# Patient Record
Sex: Male | Born: 1955 | Race: Black or African American | Hispanic: No | Marital: Married | Smoking: Former smoker
Health system: Southern US, Community
[De-identification: ages and names within clinical notes are randomized; demographics above are authoritative.]

## PROBLEM LIST (undated history)

## (undated) DIAGNOSIS — N529 Male erectile dysfunction, unspecified: Secondary | ICD-10-CM

## (undated) DIAGNOSIS — M1710 Unilateral primary osteoarthritis, unspecified knee: Secondary | ICD-10-CM

## (undated) DIAGNOSIS — T7840XA Allergy, unspecified, initial encounter: Secondary | ICD-10-CM

## (undated) DIAGNOSIS — G4730 Sleep apnea, unspecified: Secondary | ICD-10-CM

## (undated) DIAGNOSIS — E291 Testicular hypofunction: Secondary | ICD-10-CM

## (undated) DIAGNOSIS — J189 Pneumonia, unspecified organism: Secondary | ICD-10-CM

## (undated) DIAGNOSIS — M159 Polyosteoarthritis, unspecified: Secondary | ICD-10-CM

## (undated) DIAGNOSIS — R413 Other amnesia: Secondary | ICD-10-CM

## (undated) DIAGNOSIS — G4700 Insomnia, unspecified: Secondary | ICD-10-CM

## (undated) DIAGNOSIS — IMO0002 Reserved for concepts with insufficient information to code with codable children: Secondary | ICD-10-CM

## (undated) DIAGNOSIS — Z86010 Personal history of colonic polyps: Secondary | ICD-10-CM

## (undated) DIAGNOSIS — K219 Gastro-esophageal reflux disease without esophagitis: Secondary | ICD-10-CM

## (undated) DIAGNOSIS — M1990 Unspecified osteoarthritis, unspecified site: Secondary | ICD-10-CM

## (undated) DIAGNOSIS — R945 Abnormal results of liver function studies: Secondary | ICD-10-CM

## (undated) DIAGNOSIS — I10 Essential (primary) hypertension: Secondary | ICD-10-CM

## (undated) HISTORY — DX: Other amnesia: R41.3

## (undated) HISTORY — DX: Allergy, unspecified, initial encounter: T78.40XA

## (undated) HISTORY — DX: Polyosteoarthritis, unspecified: M15.9

## (undated) HISTORY — DX: Testicular hypofunction: E29.1

## (undated) HISTORY — DX: Abnormal results of liver function studies: R94.5

## (undated) HISTORY — DX: Essential (primary) hypertension: I10

## (undated) HISTORY — DX: Sleep apnea, unspecified: G47.30

## (undated) HISTORY — DX: Unspecified osteoarthritis, unspecified site: M19.90

## (undated) HISTORY — DX: Personal history of colonic polyps: Z86.010

## (undated) HISTORY — DX: Gastro-esophageal reflux disease without esophagitis: K21.9

## (undated) HISTORY — DX: Reserved for concepts with insufficient information to code with codable children: IMO0002

## (undated) HISTORY — PX: MULTIPLE TOOTH EXTRACTIONS: SHX2053

## (undated) HISTORY — PX: HAND SURGERY: SHX662

## (undated) HISTORY — PX: JOINT REPLACEMENT: SHX530

## (undated) HISTORY — DX: Male erectile dysfunction, unspecified: N52.9

## (undated) HISTORY — PX: COLONOSCOPY: SHX174

## (undated) HISTORY — DX: Unilateral primary osteoarthritis, unspecified knee: M17.10

## (undated) HISTORY — DX: Insomnia, unspecified: G47.00

---

## 1998-07-07 ENCOUNTER — Encounter: Payer: Self-pay | Admitting: Emergency Medicine

## 1998-07-07 ENCOUNTER — Emergency Department (HOSPITAL_COMMUNITY): Admission: EM | Admit: 1998-07-07 | Discharge: 1998-07-07 | Payer: Self-pay | Admitting: Emergency Medicine

## 2004-03-22 ENCOUNTER — Ambulatory Visit: Payer: Self-pay | Admitting: Internal Medicine

## 2004-05-25 ENCOUNTER — Ambulatory Visit: Payer: Self-pay | Admitting: Internal Medicine

## 2004-06-01 ENCOUNTER — Ambulatory Visit: Payer: Self-pay | Admitting: Internal Medicine

## 2005-01-02 ENCOUNTER — Ambulatory Visit: Payer: Self-pay | Admitting: Internal Medicine

## 2005-01-14 ENCOUNTER — Ambulatory Visit: Payer: Self-pay | Admitting: Internal Medicine

## 2005-02-06 ENCOUNTER — Ambulatory Visit: Payer: Self-pay | Admitting: Internal Medicine

## 2005-03-18 ENCOUNTER — Ambulatory Visit: Payer: Self-pay | Admitting: Internal Medicine

## 2005-04-15 ENCOUNTER — Ambulatory Visit: Payer: Self-pay | Admitting: Internal Medicine

## 2005-06-13 ENCOUNTER — Ambulatory Visit: Payer: Self-pay | Admitting: Internal Medicine

## 2005-09-13 ENCOUNTER — Ambulatory Visit: Payer: Self-pay | Admitting: Internal Medicine

## 2005-11-05 ENCOUNTER — Ambulatory Visit: Payer: Self-pay | Admitting: Internal Medicine

## 2006-01-10 ENCOUNTER — Ambulatory Visit: Payer: Self-pay | Admitting: Internal Medicine

## 2006-01-10 LAB — CONVERTED CEMR LAB
Alkaline Phosphatase: 49 units/L (ref 39–117)
Basophils Absolute: 0 10*3/uL (ref 0.0–0.1)
CO2: 27 meq/L (ref 19–32)
Calcium: 9.9 mg/dL (ref 8.4–10.5)
Chloride: 105 meq/L (ref 96–112)
Chol/HDL Ratio, serum: 4.2
Cholesterol: 190 mg/dL (ref 0–200)
Creatinine, Ser: 1.1 mg/dL (ref 0.4–1.5)
Glomerular Filtration Rate, Af Am: 91 mL/min/{1.73_m2}
Glucose, Bld: 105 mg/dL — ABNORMAL HIGH (ref 70–99)
LDL Cholesterol: 126 mg/dL — ABNORMAL HIGH (ref 0–99)
Lymphocytes Relative: 21.8 % (ref 12.0–46.0)
MCHC: 34.9 g/dL (ref 30.0–36.0)
MCV: 93.4 fL (ref 78.0–100.0)
Monocytes Absolute: 0.5 10*3/uL (ref 0.2–0.7)
Monocytes Relative: 7.7 % (ref 3.0–11.0)
Neutrophils Relative %: 68.4 % (ref 43.0–77.0)
Platelets: 212 10*3/uL (ref 150–400)
TSH: 1.04 microintl units/mL (ref 0.35–5.50)
Triglyceride fasting, serum: 95 mg/dL (ref 0–149)

## 2006-01-17 ENCOUNTER — Ambulatory Visit: Payer: Self-pay | Admitting: Internal Medicine

## 2006-01-30 ENCOUNTER — Ambulatory Visit: Payer: Self-pay | Admitting: Internal Medicine

## 2006-02-14 ENCOUNTER — Ambulatory Visit: Payer: Self-pay | Admitting: Internal Medicine

## 2006-03-20 ENCOUNTER — Ambulatory Visit: Payer: Self-pay | Admitting: Internal Medicine

## 2006-05-22 ENCOUNTER — Ambulatory Visit: Payer: Self-pay | Admitting: Internal Medicine

## 2006-08-22 ENCOUNTER — Ambulatory Visit: Payer: Self-pay | Admitting: Internal Medicine

## 2006-08-26 ENCOUNTER — Encounter: Admission: RE | Admit: 2006-08-26 | Discharge: 2006-08-26 | Payer: Self-pay | Admitting: Internal Medicine

## 2006-09-05 ENCOUNTER — Telehealth: Payer: Self-pay | Admitting: *Deleted

## 2006-09-09 ENCOUNTER — Encounter: Payer: Self-pay | Admitting: Internal Medicine

## 2006-10-30 ENCOUNTER — Telehealth: Payer: Self-pay | Admitting: Internal Medicine

## 2006-10-30 DIAGNOSIS — IMO0002 Reserved for concepts with insufficient information to code with codable children: Secondary | ICD-10-CM

## 2006-10-30 HISTORY — DX: Reserved for concepts with insufficient information to code with codable children: IMO0002

## 2006-11-02 ENCOUNTER — Encounter: Admission: RE | Admit: 2006-11-02 | Discharge: 2006-11-02 | Payer: Self-pay | Admitting: Internal Medicine

## 2006-11-03 ENCOUNTER — Encounter: Payer: Self-pay | Admitting: Internal Medicine

## 2006-11-03 ENCOUNTER — Telehealth: Payer: Self-pay | Admitting: *Deleted

## 2006-12-11 ENCOUNTER — Encounter: Payer: Self-pay | Admitting: Internal Medicine

## 2007-01-01 DIAGNOSIS — K219 Gastro-esophageal reflux disease without esophagitis: Secondary | ICD-10-CM | POA: Insufficient documentation

## 2007-01-01 DIAGNOSIS — I10 Essential (primary) hypertension: Secondary | ICD-10-CM | POA: Insufficient documentation

## 2007-01-01 HISTORY — DX: Gastro-esophageal reflux disease without esophagitis: K21.9

## 2007-01-06 ENCOUNTER — Ambulatory Visit: Payer: Self-pay | Admitting: Internal Medicine

## 2007-01-06 LAB — CONVERTED CEMR LAB
ALT: 85 units/L — ABNORMAL HIGH (ref 0–53)
AST: 38 units/L — ABNORMAL HIGH (ref 0–37)
Albumin: 4.6 g/dL (ref 3.5–5.2)
Alkaline Phosphatase: 54 units/L (ref 39–117)
Basophils Absolute: 0 10*3/uL (ref 0.0–0.1)
Calcium: 10.4 mg/dL (ref 8.4–10.5)
Chloride: 102 meq/L (ref 96–112)
Cholesterol: 192 mg/dL (ref 0–200)
Creatinine, Ser: 1 mg/dL (ref 0.4–1.5)
Eosinophils Absolute: 0.1 10*3/uL (ref 0.0–0.6)
GFR calc non Af Amer: 84 mL/min
HCT: 43.8 % (ref 39.0–52.0)
LDL Cholesterol: 123 mg/dL — ABNORMAL HIGH (ref 0–99)
MCHC: 34.7 g/dL (ref 30.0–36.0)
MCV: 92.7 fL (ref 78.0–100.0)
Neutrophils Relative %: 64 % (ref 43.0–77.0)
Platelets: 207 10*3/uL (ref 150–400)
Protein, U semiquant: NEGATIVE
RBC: 4.73 M/uL (ref 4.22–5.81)
RDW: 11.6 % (ref 11.5–14.6)
Sodium: 139 meq/L (ref 135–145)
Total Bilirubin: 0.9 mg/dL (ref 0.3–1.2)
Total CHOL/HDL Ratio: 6
Triglycerides: 187 mg/dL — ABNORMAL HIGH (ref 0–149)
Urobilinogen, UA: 0.2
WBC Urine, dipstick: NEGATIVE
WBC: 7.4 10*3/uL (ref 4.5–10.5)
pH: 5.5

## 2007-01-13 ENCOUNTER — Ambulatory Visit: Payer: Self-pay | Admitting: Internal Medicine

## 2007-03-17 ENCOUNTER — Ambulatory Visit: Payer: Self-pay | Admitting: Internal Medicine

## 2007-05-18 ENCOUNTER — Telehealth: Payer: Self-pay | Admitting: Internal Medicine

## 2007-06-16 ENCOUNTER — Ambulatory Visit: Payer: Self-pay | Admitting: Internal Medicine

## 2007-08-27 ENCOUNTER — Ambulatory Visit: Payer: Self-pay | Admitting: Internal Medicine

## 2007-08-27 DIAGNOSIS — R945 Abnormal results of liver function studies: Secondary | ICD-10-CM

## 2007-08-27 DIAGNOSIS — R413 Other amnesia: Secondary | ICD-10-CM

## 2007-08-27 DIAGNOSIS — R7989 Other specified abnormal findings of blood chemistry: Secondary | ICD-10-CM | POA: Insufficient documentation

## 2007-08-27 HISTORY — DX: Other amnesia: R41.3

## 2007-08-27 HISTORY — DX: Abnormal results of liver function studies: R94.5

## 2007-08-27 LAB — CONVERTED CEMR LAB
ALT: 103 units/L — ABNORMAL HIGH (ref 0–53)
AST: 55 units/L — ABNORMAL HIGH (ref 0–37)
Albumin: 4.3 g/dL (ref 3.5–5.2)
Alkaline Phosphatase: 47 units/L (ref 39–117)
BUN: 21 mg/dL (ref 6–23)
CO2: 27 meq/L (ref 19–32)
Chloride: 104 meq/L (ref 96–112)
Creatinine, Ser: 1.1 mg/dL (ref 0.4–1.5)
GFR calc non Af Amer: 75 mL/min
Glucose, Bld: 117 mg/dL — ABNORMAL HIGH (ref 70–99)
Potassium: 3.9 meq/L (ref 3.5–5.1)

## 2007-11-10 ENCOUNTER — Ambulatory Visit: Payer: Self-pay | Admitting: Internal Medicine

## 2007-11-10 DIAGNOSIS — M1710 Unilateral primary osteoarthritis, unspecified knee: Secondary | ICD-10-CM | POA: Insufficient documentation

## 2007-11-10 DIAGNOSIS — M159 Polyosteoarthritis, unspecified: Secondary | ICD-10-CM

## 2007-11-10 HISTORY — DX: Unilateral primary osteoarthritis, unspecified knee: M17.10

## 2007-11-10 HISTORY — DX: Polyosteoarthritis, unspecified: M15.9

## 2007-11-10 LAB — CONVERTED CEMR LAB
ALT: 109 units/L — ABNORMAL HIGH (ref 0–53)
Bilirubin, Direct: 0.1 mg/dL (ref 0.0–0.3)
CO2: 28 meq/L (ref 19–32)
Calcium: 9.8 mg/dL (ref 8.4–10.5)
Creatinine, Ser: 1.1 mg/dL (ref 0.4–1.5)
GFR calc Af Amer: 90 mL/min
Sodium: 140 meq/L (ref 135–145)
Total Bilirubin: 1 mg/dL (ref 0.3–1.2)
Total Protein: 7.4 g/dL (ref 6.0–8.3)

## 2007-11-17 ENCOUNTER — Ambulatory Visit: Payer: Self-pay | Admitting: Internal Medicine

## 2007-11-24 ENCOUNTER — Ambulatory Visit: Payer: Self-pay | Admitting: Internal Medicine

## 2007-12-01 ENCOUNTER — Ambulatory Visit: Payer: Self-pay | Admitting: Internal Medicine

## 2008-01-29 ENCOUNTER — Ambulatory Visit: Payer: Self-pay | Admitting: Internal Medicine

## 2008-01-29 LAB — CONVERTED CEMR LAB
ALT: 91 units/L — ABNORMAL HIGH (ref 0–53)
Basophils Absolute: 0.1 10*3/uL (ref 0.0–0.1)
Basophils Relative: 0.7 % (ref 0.0–3.0)
Bilirubin Urine: NEGATIVE
Calcium: 9.9 mg/dL (ref 8.4–10.5)
Cholesterol: 185 mg/dL (ref 0–200)
Creatinine, Ser: 0.9 mg/dL (ref 0.4–1.5)
Eosinophils Absolute: 0.1 10*3/uL (ref 0.0–0.7)
GFR calc Af Amer: 114 mL/min
GFR calc non Af Amer: 94 mL/min
Glucose, Urine, Semiquant: NEGATIVE
Hemoglobin: 15.4 g/dL (ref 13.0–17.0)
Ketones, urine, test strip: NEGATIVE
MCHC: 34.5 g/dL (ref 30.0–36.0)
MCV: 94.6 fL (ref 78.0–100.0)
Monocytes Absolute: 0.6 10*3/uL (ref 0.1–1.0)
Neutro Abs: 4.9 10*3/uL (ref 1.4–7.7)
PSA: 0.94 ng/mL (ref 0.10–4.00)
Protein, U semiquant: NEGATIVE
RBC: 4.72 M/uL (ref 4.22–5.81)
RDW: 11.8 % (ref 11.5–14.6)
TSH: 0.77 microintl units/mL (ref 0.35–5.50)
Total Bilirubin: 1.1 mg/dL (ref 0.3–1.2)
Triglycerides: 87 mg/dL (ref 0–149)
pH: 6.5

## 2008-02-05 ENCOUNTER — Ambulatory Visit: Payer: Self-pay | Admitting: Internal Medicine

## 2008-04-18 ENCOUNTER — Telehealth: Payer: Self-pay | Admitting: Internal Medicine

## 2008-06-07 ENCOUNTER — Ambulatory Visit: Payer: Self-pay | Admitting: Internal Medicine

## 2008-06-07 DIAGNOSIS — M1990 Unspecified osteoarthritis, unspecified site: Secondary | ICD-10-CM | POA: Insufficient documentation

## 2008-06-07 HISTORY — DX: Unspecified osteoarthritis, unspecified site: M19.90

## 2008-06-07 LAB — CONVERTED CEMR LAB
AST: 43 units/L — ABNORMAL HIGH (ref 0–37)
Albumin: 4.4 g/dL (ref 3.5–5.2)
Alkaline Phosphatase: 54 units/L (ref 39–117)
CO2: 27 meq/L (ref 19–32)
Calcium: 9.6 mg/dL (ref 8.4–10.5)
Cholesterol: 177 mg/dL (ref 0–200)
Glucose, Bld: 101 mg/dL — ABNORMAL HIGH (ref 70–99)
HDL: 38.2 mg/dL — ABNORMAL LOW (ref >39.00)
Sodium: 140 meq/L (ref 135–145)
Total Protein: 7.4 g/dL (ref 6.0–8.3)

## 2008-06-14 ENCOUNTER — Ambulatory Visit: Payer: Self-pay | Admitting: Internal Medicine

## 2008-06-22 ENCOUNTER — Telehealth: Payer: Self-pay | Admitting: Internal Medicine

## 2008-07-11 ENCOUNTER — Telehealth: Payer: Self-pay | Admitting: Internal Medicine

## 2008-09-13 ENCOUNTER — Ambulatory Visit: Payer: Self-pay | Admitting: Internal Medicine

## 2008-09-13 LAB — CONVERTED CEMR LAB
ALT: 60 units/L — ABNORMAL HIGH (ref 0–53)
AST: 37 units/L (ref 0–37)
Albumin: 4.4 g/dL (ref 3.5–5.2)
Alkaline Phosphatase: 56 units/L (ref 39–117)
BUN: 25 mg/dL — ABNORMAL HIGH (ref 6–23)
Chloride: 107 meq/L (ref 96–112)
GFR calc non Af Amer: 100.39 mL/min (ref >60)
Total Protein: 7.3 g/dL (ref 6.0–8.3)

## 2008-11-14 ENCOUNTER — Telehealth: Payer: Self-pay | Admitting: Internal Medicine

## 2008-12-16 ENCOUNTER — Telehealth: Payer: Self-pay | Admitting: Internal Medicine

## 2009-02-15 ENCOUNTER — Ambulatory Visit: Payer: Self-pay | Admitting: Internal Medicine

## 2009-02-15 LAB — CONVERTED CEMR LAB
Albumin: 4.6 g/dL (ref 3.5–5.2)
Basophils Relative: 0.6 % (ref 0.0–3.0)
CO2: 31 meq/L (ref 19–32)
Chloride: 104 meq/L (ref 96–112)
Eosinophils Absolute: 0.1 10*3/uL (ref 0.0–0.7)
Glucose, Urine, Semiquant: NEGATIVE
Hemoglobin: 14.1 g/dL (ref 13.0–17.0)
Ketones, urine, test strip: NEGATIVE
Lymphs Abs: 1.8 10*3/uL (ref 0.7–4.0)
MCHC: 34 g/dL (ref 30.0–36.0)
MCV: 96.8 fL (ref 78.0–100.0)
Monocytes Absolute: 0.5 10*3/uL (ref 0.1–1.0)
Neutro Abs: 3.9 10*3/uL (ref 1.4–7.7)
Nitrite: NEGATIVE
PSA: 0.77 ng/mL (ref 0.10–4.00)
RBC: 4.28 M/uL (ref 4.22–5.81)
Sodium: 144 meq/L (ref 135–145)
Specific Gravity, Urine: >=1.03
Total CHOL/HDL Ratio: 4
Total Protein: 7.4 g/dL (ref 6.0–8.3)
Triglycerides: 83 mg/dL (ref 0.0–149.0)
pH: 5

## 2009-02-21 ENCOUNTER — Ambulatory Visit: Payer: Self-pay | Admitting: Internal Medicine

## 2009-02-21 DIAGNOSIS — N529 Male erectile dysfunction, unspecified: Secondary | ICD-10-CM

## 2009-02-21 HISTORY — DX: Male erectile dysfunction, unspecified: N52.9

## 2009-02-22 LAB — CONVERTED CEMR LAB: Testosterone: 193.35 ng/dL — ABNORMAL LOW (ref 350.00–890.00)

## 2009-03-02 ENCOUNTER — Ambulatory Visit: Payer: Self-pay | Admitting: Internal Medicine

## 2009-03-17 ENCOUNTER — Encounter: Payer: Self-pay | Admitting: Internal Medicine

## 2009-05-05 ENCOUNTER — Ambulatory Visit: Payer: Self-pay | Admitting: Internal Medicine

## 2009-05-05 LAB — CONVERTED CEMR LAB
ALT: 59 U/L — ABNORMAL HIGH
AST: 34 U/L
Albumin: 4.5 g/dL
Alkaline Phosphatase: 44 U/L
Bilirubin, Direct: 0 mg/dL
Total Bilirubin: 0.7 mg/dL
Total Protein: 7.4 g/dL

## 2009-05-15 ENCOUNTER — Ambulatory Visit: Payer: Self-pay | Admitting: Internal Medicine

## 2009-08-07 ENCOUNTER — Ambulatory Visit: Payer: Self-pay | Admitting: Internal Medicine

## 2009-08-07 LAB — CONVERTED CEMR LAB
AST: 33 units/L (ref 0–37)
Alkaline Phosphatase: 46 units/L (ref 39–117)
Testosterone: 297.24 ng/dL — ABNORMAL LOW (ref 350.00–890.00)
Total Bilirubin: 0.6 mg/dL (ref 0.3–1.2)

## 2009-08-14 ENCOUNTER — Ambulatory Visit: Payer: Self-pay | Admitting: Internal Medicine

## 2009-08-14 DIAGNOSIS — E291 Testicular hypofunction: Secondary | ICD-10-CM

## 2009-08-14 DIAGNOSIS — R7989 Other specified abnormal findings of blood chemistry: Secondary | ICD-10-CM | POA: Insufficient documentation

## 2009-08-14 HISTORY — DX: Testicular hypofunction: E29.1

## 2009-09-18 ENCOUNTER — Telehealth: Payer: Self-pay | Admitting: Internal Medicine

## 2009-09-20 ENCOUNTER — Telehealth: Payer: Self-pay | Admitting: Internal Medicine

## 2009-09-20 DIAGNOSIS — G4730 Sleep apnea, unspecified: Secondary | ICD-10-CM

## 2009-09-20 HISTORY — DX: Sleep apnea, unspecified: G47.30

## 2009-10-27 ENCOUNTER — Ambulatory Visit (HOSPITAL_BASED_OUTPATIENT_CLINIC_OR_DEPARTMENT_OTHER): Admission: RE | Admit: 2009-10-27 | Discharge: 2009-10-27 | Payer: Self-pay | Admitting: Internal Medicine

## 2009-10-27 ENCOUNTER — Encounter: Payer: Self-pay | Admitting: Internal Medicine

## 2009-11-06 ENCOUNTER — Ambulatory Visit: Payer: Self-pay | Admitting: Internal Medicine

## 2009-11-13 ENCOUNTER — Ambulatory Visit: Payer: Self-pay | Admitting: Internal Medicine

## 2009-11-13 ENCOUNTER — Ambulatory Visit: Payer: Self-pay | Admitting: Pulmonary Disease

## 2009-11-13 DIAGNOSIS — G4700 Insomnia, unspecified: Secondary | ICD-10-CM

## 2009-11-13 HISTORY — DX: Insomnia, unspecified: G47.00

## 2009-11-14 ENCOUNTER — Encounter: Payer: Self-pay | Admitting: Internal Medicine

## 2009-12-26 ENCOUNTER — Ambulatory Visit: Payer: Self-pay | Admitting: Internal Medicine

## 2009-12-30 ENCOUNTER — Ambulatory Visit: Payer: Self-pay | Admitting: Family Medicine

## 2009-12-30 DIAGNOSIS — J329 Chronic sinusitis, unspecified: Secondary | ICD-10-CM | POA: Insufficient documentation

## 2010-01-01 ENCOUNTER — Telehealth: Payer: Self-pay | Admitting: Internal Medicine

## 2010-01-02 ENCOUNTER — Ambulatory Visit: Payer: Self-pay | Admitting: Internal Medicine

## 2010-01-03 ENCOUNTER — Encounter: Payer: Self-pay | Admitting: Internal Medicine

## 2010-01-09 ENCOUNTER — Ambulatory Visit: Payer: Self-pay | Admitting: Internal Medicine

## 2010-03-20 ENCOUNTER — Other Ambulatory Visit: Payer: Self-pay | Admitting: Internal Medicine

## 2010-03-20 ENCOUNTER — Ambulatory Visit
Admission: RE | Admit: 2010-03-20 | Discharge: 2010-03-20 | Payer: Self-pay | Source: Home / Self Care | Attending: Internal Medicine | Admitting: Internal Medicine

## 2010-03-20 LAB — CBC WITH DIFFERENTIAL/PLATELET
Basophils Absolute: 0.1 10*3/uL (ref 0.0–0.1)
Basophils Relative: 0.6 % (ref 0.0–3.0)
Eosinophils Absolute: 0.5 10*3/uL (ref 0.0–0.7)
Eosinophils Relative: 5.2 % — ABNORMAL HIGH (ref 0.0–5.0)
HCT: 45 % (ref 39.0–52.0)
Hemoglobin: 15.5 g/dL (ref 13.0–17.0)
Lymphocytes Relative: 22.5 % (ref 12.0–46.0)
Lymphs Abs: 2 10*3/uL (ref 0.7–4.0)
MCHC: 34.4 g/dL (ref 30.0–36.0)
MCV: 93.5 fl (ref 78.0–100.0)
Monocytes Absolute: 0.7 10*3/uL (ref 0.1–1.0)
Monocytes Relative: 7.4 % (ref 3.0–12.0)
Neutro Abs: 5.7 10*3/uL (ref 1.4–7.7)
Neutrophils Relative %: 64.3 % (ref 43.0–77.0)
Platelets: 234 10*3/uL (ref 150.0–400.0)
RBC: 4.81 Mil/uL (ref 4.22–5.81)
RDW: 12.7 % (ref 11.5–14.6)
WBC: 8.8 10*3/uL (ref 4.5–10.5)

## 2010-03-20 LAB — HEPATIC FUNCTION PANEL
ALT: 50 U/L (ref 0–53)
AST: 34 U/L (ref 0–37)
Albumin: 4.9 g/dL (ref 3.5–5.2)
Alkaline Phosphatase: 53 U/L (ref 39–117)
Bilirubin, Direct: 0.2 mg/dL (ref 0.0–0.3)
Total Bilirubin: 1.2 mg/dL (ref 0.3–1.2)
Total Protein: 7.9 g/dL (ref 6.0–8.3)

## 2010-03-20 LAB — CONVERTED CEMR LAB
Bilirubin Urine: NEGATIVE
Ketones, urine, test strip: NEGATIVE
Nitrite: NEGATIVE
Protein, U semiquant: NEGATIVE
Urobilinogen, UA: 0.2

## 2010-03-20 LAB — BASIC METABOLIC PANEL WITH GFR
BUN: 23 mg/dL (ref 6–23)
CO2: 29 meq/L (ref 19–32)
Calcium: 10.1 mg/dL (ref 8.4–10.5)
Chloride: 102 meq/L (ref 96–112)
Creatinine, Ser: 1.2 mg/dL (ref 0.4–1.5)
GFR: 84.11 mL/min
Glucose, Bld: 107 mg/dL — ABNORMAL HIGH (ref 70–99)
Potassium: 4.9 meq/L (ref 3.5–5.1)
Sodium: 140 meq/L (ref 135–145)

## 2010-03-20 LAB — LIPID PANEL
Cholesterol: 189 mg/dL (ref 0–200)
HDL: 41.8 mg/dL
LDL Cholesterol: 114 mg/dL — ABNORMAL HIGH (ref 0–99)
Total CHOL/HDL Ratio: 5
Triglycerides: 167 mg/dL — ABNORMAL HIGH (ref 0.0–149.0)
VLDL: 33.4 mg/dL (ref 0.0–40.0)

## 2010-03-27 ENCOUNTER — Encounter: Payer: Self-pay | Admitting: Internal Medicine

## 2010-03-27 DIAGNOSIS — G4700 Insomnia, unspecified: Secondary | ICD-10-CM

## 2010-03-27 NOTE — Assessment & Plan Note (Signed)
Summary: 3 month rov/n/jr Nathan Stewart BMP/NJR   Vital Signs:  Patient profile:   55 year old male Height:      74 inches Weight:      208 pounds BMI:     26.80 Temp:     98.2 degrees F oral Pulse rate:   80 / minute Resp:     14 per minute BP sitting:   140 / 80  (left arm)  Vitals Entered By: Willy Eddy, LPN (May 15, 2009 3:42 PM) CC: roa labs, Hypertension Management   CC:  roa labs and Hypertension Management.  History of Present Illness: follow up for HTN, chronically elvated lfts from chronic hepatitis GERD and ED discussion of his medications and [place] labs  Hypertension History:      He denies headache, chest pain, palpitations, dyspnea with exertion, orthopnea, PND, peripheral edema, visual symptoms, neurologic problems, syncope, and side effects from treatment.        Positive major cardiovascular risk factors include male age 67 years old or older and hypertension.  Negative major cardiovascular risk factors include non-tobacco-user status.     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) 5)  Memory Loss  (ICD-780.93) 6)  Liver Function Tests, Abnormal  (ICD-794.8) 7)  Physical Examination  (ICD-V70.0) 8)  Hypertension  (ICD-401.9) 9)  Gerd  (ICD-530.81) 10)  Meniscus Tear  (ICD-836.2)  Current Problems (verified): 1)  Erectile Dysfunction, Organic  (ICD-607.84) 2)  Osteoarthritis  (ICD-715.90) 3)  Loc Osteoarthros Not Spec Prim/sec Lower Leg  (ICD-715.36) 4)  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 One By Mouth Two Times A Day 2)  Viagra 50 Mg Tabs (Sildenafil Citrate) .... One By Mouth As Directed 3)  Benicar Hct 20-12.5 Mg Tabs (Olmesartan Medoxomil-Hctz) .... One By Mouth Dialy 4)  Testosterone Cypionate 200 Mg/ml Oil (Testosterone Cypionate) .Marland Kitchen.. 1 Ml Every Month  Current Medications (verified): 1)  Ibuprofen 800 Mg  Tabs (Ibuprofen) .... One By Mouth Two Times A Day 2)  Viagra 50 Mg Tabs (Sildenafil Citrate) .... One By Mouth As Directed 3)  Benicar Hct 20-12.5 Mg Tabs (Olmesartan Medoxomil-Hctz) .... One By Mouth Dialy 4)  Testosterone Cypionate 200 Mg/ml Oil (Testosterone Cypionate) .Marland Kitchen.. 1 Ml Every Month  Allergies (verified): No Known Drug Allergies  Past History:  Social History: Last updated: 03/17/2007 Married Former Smoker  Risk Factors: Smoking Status: quit (05/15/2009)  Past medical, surgical, family and social histories (including risk factors) reviewed, and no changes noted (except as noted below).  Past Medical History: Reviewed history from 12/01/2007 and no changes required. GERD Hypertension Osteoarthritis  Past Surgical History: Reviewed history from 01/13/2007 and no changes required. hand surgery  Family History: Reviewed history and no changes required.  Social History: Reviewed history from 03/17/2007 and no changes required. Married Former Smoker  Review of Systems  The patient denies anorexia, fever, weight loss, weight gain, vision loss, decreased hearing, hoarseness, chest pain, syncope, dyspnea on exertion,  peripheral edema, prolonged cough, headaches, hemoptysis, abdominal pain, melena, hematochezia, severe indigestion/heartburn, hematuria, incontinence, genital sores, muscle weakness, suspicious skin lesions, transient blindness, difficulty walking, depression, unusual weight change, abnormal bleeding, enlarged  lymph nodes, angioedema, and breast masses.    Physical Exam  General:  Well-developed,well-nourished,in no acute distress; alert,appropriate and cooperative throughout examination  AA male Head:  normocephalic and atraumatic.   Neck:  supple.   Lungs:  normal respiratory effort and no wheezes.   Heart:  normal rate and regular rhythm.   Abdomen:  soft, non-tender, and normal bowel sounds.   Rectal:  no external abnormalities and external hemorrhoid(s).   Msk:  no joint warmth, decreased ROM, joint tenderness, and joint swelling.   Extremities:  No clubbing, cyanosis, edema, or deformity noted with normal full range of motion of all joints.   Neurologic:  alert & oriented X3 and cranial nerves II-XII intact.     Impression & Recommendations:  Problem # 1:  HYPERTENSION (ICD-401.9)  His updated medication list for this problem includes:    Benicar Hct 20-12.5 Mg Tabs (Olmesartan medoxomil-hctz) ..... One by mouth dialy  BP today: 140/80 Prior BP: 142/88 (02/21/2009)  Prior 10 Yr Risk Heart Disease: 18 % (01/13/2007)  Labs Reviewed: K+: 3.9 (02/15/2009) Creat: : 1.2 (02/15/2009)   Chol: 179 (02/15/2009)   HDL: 41.90 (02/15/2009)   LDL: 121 (02/15/2009)   TG: 83.0 (02/15/2009)  Problem # 2:  ERECTILE DYSFUNCTION, ORGANIC (ICD-607.84) Assessment: Unchanged  His updated medication list for this problem includes:    Viagra 50 Mg Tabs (Sildenafil citrate) ..... One by mouth as directed  Discussed proper use of medications, as well as side effects.   Problem # 3:  LIVER FUNCTION TESTS, ABNORMAL (ICD-794.8)  Advised patient to avoid alcohol and Tylenol. Call for worsening symptoms.   Complete Medication List: 1)  Ibuprofen 800 Mg Tabs (Ibuprofen) .... One by mouth two times a day 2)  Viagra 50 Mg Tabs (Sildenafil citrate) .... One by mouth as directed 3)  Benicar Hct 20-12.5 Mg Tabs (Olmesartan medoxomil-hctz) .... One by mouth dialy 4)  Testosterone Cypionate 200 Mg/ml Oil  (Testosterone cypionate) .Marland Kitchen.. 1 ml every month  Hypertension Assessment/Plan:      The patient's hypertensive risk group is category B: At least one risk factor (excluding diabetes) with no target organ damage.  His calculated 10 year risk of coronary heart disease is 14 %.  Today's blood pressure is 140/80.  His blood pressure goal is < 140/90.  Patient Instructions: 1)  Change the testosterone   injections to .5 cc every two weeks 2)  Please schedule a follow-up appointment in 3 months. 3)  testosterone level prior   and  607.84 4)  Hepatic Panel prior to visit, ICD-9: 995.20

## 2010-03-27 NOTE — Assessment & Plan Note (Signed)
Summary: TESTOSTERONE INJ // RS  Nurse Visit   Allergies: No Known Drug Allergies  Medication Administration  Injection # 1:    Medication: Testosterone Cypionat 200mg  ing    Diagnosis: ERECTILE DYSFUNCTION, ORGANIC (ICD-607.84)    Route: IM    Site: RUOQ gluteus    Exp Date: 07/26/2010    Lot #: [id number]    Mfr: sandoz    Patient tolerated injection without complications    Given by: Willy Eddy, LPN (March 02, 2009 4:39 PM)  Orders Added: 1)  Admin of patients own med IM/SQ [phone]

## 2010-03-27 NOTE — Assessment & Plan Note (Signed)
Summary: last synvisc injection/bmw   Vital Signs:  Patient profile:   55 year old male Height:      74 inches Weight:      206 pounds Temp:     98.2 degrees F oral Pulse rate:   72 / minute Resp:     14 per minute  Vitals Entered By: Willy Eddy, LPN (January 09, 2010 2:37 PM) CC: third a nd final sunvisc [id number]  wexp 2014-05--[employer] Is Patient Diabetic? No   Primary Care Provider:  Stacie Glaze MD  CC:  third a nd final sunvisc [id number]  wexp 2014-05--[employer].  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) 9)  Memory Loss  (ICD-780.93) 10)  Liver Function Tests, Abnormal  (ICD-794.8) 11)  Physical Examination  (ICD-V70.0) 12)  Hypertension  (ICD-401.9) 13)  Gerd  (ICD-530.81) 14)  Meniscus Tear  (ICD-836.2)  Current Problems (verified): 1)  Unspecified Sinusitis  (ICD-473.9) 2)  Insomnia, Chronic  (ICD-307.42) 3)  Sleep Apnea  (ICD-780.57) 4)  Testicular Hypofunction  (ICD-257.2) 5)  Erectile Dysfunction, Organic  (ICD-607.84) 6)  Osteoarthritis  (ICD-715.90) 7)  Loc Osteoarthros Not Spec Prim/sec Lower Leg  (ICD-715.36) 8)  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 One By Mouth Two Times A Day 2)  Viagra 50 Mg Tabs (Sildenafil Citrate) .... One By Mouth As Directed 3)  Benicar Hct 20-12.5 Mg Tabs (Olmesartan Medoxomil-Hctz) .... One By Mouth Dialy 4)  Testosterone Cypionate 200 Mg/ml Oil (Testosterone Cypionate) .... 1/2 Cc Every Two Weeks 5)  Monoject Syringe 21g X 1-1/2" 3 Ml Misc (Syringe/needle (Disp)) .... Use For Testosterone Injections 6)  Zolpidem Tartrate 5 Mg Tabs (Zolpidem Tartrate) .... One By Mouth Q Hs Prn 7)  Amoxicillin 875 Mg Tabs (Amoxicillin) .Marland Kitchen.. 1 By Mouth Two Times A Day  Current Medications (verified): 1)  Ibuprofen 800 Mg  Tabs (Ibuprofen) .... One By Mouth Two Times A Day 2)  Viagra 50 Mg Tabs (Sildenafil Citrate) .... One By Mouth As Directed 3)  Benicar Hct 20-12.5 Mg Tabs (Olmesartan Medoxomil-Hctz) .... One By Mouth Dialy 4)  Testosterone Cypionate 200 Mg/ml Oil (Testosterone Cypionate) .... 1/2 Cc Every Two Weeks 5)  Monoject Syringe 21g X 1-1/2" 3 Ml Misc (Syringe/needle (Disp)) .... Use For Testosterone Injections 6)  Zolpidem Tartrate 5 Mg Tabs (Zolpidem Tartrate) .... One By Mouth Q Hs Prn 7)  Amoxicillin 875 Mg Tabs (Amoxicillin) .Marland Kitchen.. 1 By Mouth Two Times A Day  Allergies (verified): No Known Drug Allergies  Past History:  Social History: Last updated: 03/17/2007 Married Former Smoker  Risk Factors: Smoking Status: quit (01/09/2010)  Past  medical, surgical, family and social histories (including risk factors) reviewed for relevance to current acute and chronic problems.  Past Medical History: Reviewed history from 12/01/2007 and no changes required. GERD Hypertension Osteoarthritis  Past Surgical History: Reviewed history from 01/13/2007 and no changes required. hand surgery  Family History: Reviewed history and no changes required.  Social History: Reviewed history from  03/17/2007 and no changes required. Married Former Smoker  Physical Exam  General:  Well-developed,well-nourished,in no acute distress; alert,appropriate and cooperative throughout examination  AA male Msk:  no redness over joints, no joint instability, and joint tenderness.     Impression & Recommendations:  Problem # 1:  LOC OSTEOARTHROS NOT SPEC PRIM/SEC LOWER LEG (ICD-715.36)  Informed consen obtained and then the joint was prepped in a sterile manor and 40 mg depo and 1/2 cc 1% lidocaine injected into the synovial space. After care discussed. Pt tolerated procedure well.  His updated medication list for this problem includes:    Ibuprofen 800 Mg Tabs (Ibuprofen) ..... One by mouth two times a day  Discussed use of medications, application of heat or cold, and exercises.   Orders: No Charge Patient Arrived (NCPA0) (NCPA0) Synvisc injection, 2 ml (A2130)  Complete Medication List: 1)  Ibuprofen 800 Mg Tabs (Ibuprofen) .... One by mouth two times a day 2)  Viagra 50 Mg Tabs (Sildenafil citrate) .... One by mouth as directed 3)  Benicar Hct 20-12.5 Mg Tabs (Olmesartan medoxomil-hctz) .... One by mouth dialy 4)  Testosterone Cypionate 200 Mg/ml Oil (Testosterone cypionate) .... 1/2 cc every two weeks 5)  Monoject Syringe 21g X 1-1/2" 3 Ml Misc (Syringe/needle (disp)) .... Use for testosterone injections 6)  Zolpidem Tartrate 5 Mg Tabs (Zolpidem tartrate) .... One by mouth q hs prn 7)  Amoxicillin 875 Mg Tabs (Amoxicillin) .Marland Kitchen.. 1 by mouth two times a day  Patient Instructions: 1)  CPX in late dec or Jan   Orders Added: 1)  No Charge Patient Arrived (NCPA0) [NCPA0] 2)  Synvisc injection, 2 ml [J7322]

## 2010-03-27 NOTE — Medication Information (Signed)
Summary: Ibuprofen Approved/[facility]  Ibuprofen Approved/[facility]   Imported By: Sherian Rein 03/22/2009 07:54:18  _____________________________________________________________________  External Attachment:    Type:   Image     Comment:   External Document

## 2010-03-27 NOTE — Letter (Signed)
Summary: Unable to [facility]  Unable to [facility]   Imported By: Maryln Gottron 01/10/2010 13:32:37  _____________________________________________________________________  External Attachment:    Type:   Image     Comment:   External Document

## 2010-03-27 NOTE — Assessment & Plan Note (Signed)
Summary: SINUS/DLO   Vital Signs:  Patient profile:   55 year old male Height:      74 inches Weight:      206 pounds BMI:     26.54 O2 Sat:      95 % on Room air Temp:     98.2 degrees F oral Pulse rate:   73 / minute BP sitting:   154 / 92  (left arm) Cuff size:   regular  Vitals Entered By: Bill Salinas CMA (December 30, 2009 12:08 PM)  O2 Flow:  Room air CC: pt here with c/o feeling light headed with sensitivity to light ( causing his eyes to water)/ ab   CC:  pt here with c/o feeling light headed with sensitivity to light ( causing his eyes to water)/ ab.  History of Present Illness: Has had symptoms for a few days after working in yard.  Light sensitive.  eyes watering.  "Feels like head is underwater."  Pain in throat and R side of face.  No FCNAV.  thicker yellow mucous from nose this AM.  Lightheaded- no presyncope- "I feel a little wobbly" for a few seconds after changing positions.   No room spinning per patient.   Then it self resolves.  Tired.   "This happens in the fall and spring, especially when I was mowing leaves."  usually better with allergy medicine, but hasn't tried this yet.  Voice change noted in AM.  No vision change.  Current Medications (verified): 1)  Ibuprofen 800 Mg  Tabs (Ibuprofen) .... One By Mouth Two Times A Day 2)  Viagra 50 Mg Tabs (Sildenafil Citrate) .... One By Mouth As Directed 3)  Benicar Hct 20-12.5 Mg Tabs (Olmesartan Medoxomil-Hctz) .... One By Mouth Dialy 4)  Testosterone Cypionate 200 Mg/ml Oil (Testosterone Cypionate) .... 1/2 Cc Every Two Weeks 5)  Monoject Syringe 21g X 1-1/2" 3 Ml Misc (Syringe/needle (Disp)) .... Use For Testosterone Injections 6)  Zolpidem Tartrate 5 Mg Tabs (Zolpidem Tartrate) .... One By Mouth Q Hs Prn  Allergies (verified): No Known Drug Allergies  Review of Systems       See HPI.  Otherwise negative.    Physical Exam  General:  GEN: nad, alert and oriented HEENT: mucous membranes moist, TM w/o  erythema, nasal epithelium injected, OP with cobblestoning, no exudates, R frontal sinus tender to palpation, fundus wnl bilateral  NECK: supple w/o LA CV: rrr. PULM: ctab, no inc wob   Impression & Recommendations:  Problem # 1:  UNSPECIFIED SINUSITIS (ICD-473.9) I would tx with amoxil given exam.  Nontoxic, no vision change and okay for outpatient follow up.  I talked to patient about using a dust mask before yard work.  follow up as needed. He agreed with plan.  His updated medication list for this problem includes:    Amoxicillin 875 Mg Tabs (Amoxicillin) .Marland Kitchen... 1 by mouth two times a day  Complete Medication List: 1)  Ibuprofen 800 Mg Tabs (Ibuprofen) .... One by mouth two times a day 2)  Viagra 50 Mg Tabs (Sildenafil citrate) .... One by mouth as directed 3)  Benicar Hct 20-12.5 Mg Tabs (Olmesartan medoxomil-hctz) .... One by mouth dialy 4)  Testosterone Cypionate 200 Mg/ml Oil (Testosterone cypionate) .... 1/2 cc every two weeks 5)  Monoject Syringe 21g X 1-1/2" 3 Ml Misc (Syringe/needle (disp)) .... Use for testosterone injections 6)  Zolpidem Tartrate 5 Mg Tabs (Zolpidem tartrate) .... One by mouth q hs prn 7)  Amoxicillin 875  Mg Tabs (Amoxicillin) .Marland Kitchen.. 1 by mouth two times a day  Patient Instructions: 1)  I would start the antibiotics today and use over the counter cold medicine.  This should gradually get better.  Follow up with your regular doctor if your symptoms go on after you finish the medicine.  Take care.  Prescriptions: AMOXICILLIN 875 MG TABS (AMOXICILLIN) 1 by mouth two times a day  #20 x 0   Entered and Authorized by:   Crawford Givens MD   Signed by:   Crawford Givens MD on 12/30/2009   Method used:   Telephoned to ...       [facility]  [address] [id number] ([facility]ail[facility]       [address]       [place], [place]  [address]       Ph: [phone]       Fax: [phone]   RxID:   [id number]    Orders Added: 1)  Est. Patient Level III [24401]

## 2010-03-27 NOTE — Progress Notes (Signed)
Summary: Call A Nurse  Phone Note Call from Patient   Summary of Call: was seen in saturday clinic and txed Initial call taken by: Willy Eddy, LPN,  January 01, 2010 8:42 AM     Call-A-Nurse Triage Call Report Triage Record Num: [id number] Operator: April Finney Patient Name: Nathan Stewart Call Date & Time: 12/30/2009 11:00:31AM Patient Phone: [phone] PCP: Darryll Capers Patient Gender: Male PCP Fax : [phone] Patient DOB: 21-Jun-1955 Practice Name: [facility] Reason for Call: Simona Huh calling about sinus pressure, sore throat and light sensativity. Onset 12/29/09. Afebrile. Eyes draining yellow discharge. No emergent symptoms. See in 24 hrs care advice given. Transfered to office for appt at the [facility] office. He is aware appts are at [facility] office for today. Protocol(s) Used: Upper Respiratory Infection (URI) Recommended Outcome per Protocol: See Provider within 24 hours Reason for Outcome: New onset of eye redness, irritation/foreign body sensation or gritty feeling with yellow/green drainage Care Advice:  ~ SYMPTOM / CONDITION MANAGEMENT 11/

## 2010-03-27 NOTE — Progress Notes (Signed)
Summary: syringe rx  Phone Note Refill Request Message from:  Fax from Pharmacy on September 18, 2009 2:30 PM  Refills Requested: Medication #1:  TESTOSTERONE CYPIONATE 200 MG/ML OIL 1/2 cc every two weeks. Initial call taken by: Kern Reap CMA Duncan Dull),  September 18, 2009 2:31 PM    New/Updated Medications: MONOJECT SYRINGE 21G X 1-1/2" 3 ML MISC (SYRINGE/NEEDLE (DISP)) use for testosterone injections Prescriptions: MONOJECT SYRINGE 21G X 1-1/2" 3 ML MISC (SYRINGE/NEEDLE (DISP)) use for testosterone injections  #100 x 3   Entered by:   Kern Reap CMA (AAMA)   Authorized by:   Stacie Glaze MD   Signed by:   Kern Reap CMA (AAMA) on 09/18/2009   Method used:   [facility]        [facility]  [address] [phone] (retail[facility]       [address]       [place], [place]  [address]       Ph: [phone]       Fax: [phone]   RxID:   [id number]

## 2010-03-27 NOTE — Assessment & Plan Note (Signed)
Summary: 3 month fup//ccm   Vital Signs:  Patient profile:   55 year old male Height:      74 inches Weight:      204 pounds BMI:     26.29 Temp:     98.2 degrees F oral Pulse rate:   76 / minute Resp:     14 per minute BP sitting:   140 / 80  (left arm)  Vitals Entered By: Willy Eddy, LPN (November 13, 2009 2:57 PM) CC: roa labs-- no sleep study in e-chart Is Patient Diabetic? No   CC:  roa labs-- no sleep study in e-chart.  History of Present Illness: THE PT PRESENTS FOR EVALUATION OF EXCESSIVE DAY TIME SONOLENCE WITH OBSERVED APNEA  BY WIFE  The results of the sleep study were discussed in lnegth including all options fro therapy His BMI is 26 and weigth loss might help some but he is not obese Pt is a non smoker  right shoulder pain consistant with bursitis   HTN stable on medications    Preventive Screening-Counseling & Management  Alcohol-Tobacco     Smoking Status: quit     Year Quit: 1999     Tobacco Counseling: to remain off tobacco products  Problems Prior to Update: 1)  Insomnia, Chronic  (ICD-307.42) 2)  Sleep Apnea  (ICD-780.57) 3)  Testicular Hypofunction  (ICD-257.2) 4)  Erectile Dysfunction, Organic  (ICD-607.84) 5)  Osteoarthritis  (ICD-715.90) 6)  Loc Osteoarthros Not Spec Prim/sec Lower Leg  (ICD-715.36) 7)  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) 8)  Memory Loss  (ICD-780.93) 9)  Liver Function Tests, Abnormal  (ICD-794.8) 10)  Physical Examination  (ICD-V70.0) 11)  Hypertension  (ICD-401.9) 12)  Gerd  (ICD-530.81) 13)  Meniscus Tear  (ICD-836.2)  Current Problems (verified): 1)  Sleep Apnea  (ICD-780.57) 2)  Testicular Hypofunction  (ICD-257.2) 3)  Erectile Dysfunction, Organic  (ICD-607.84) 4)  Osteoarthritis  (ICD-715.90) 5)  Loc Osteoarthros Not Spec Prim/sec Lower Leg  (ICD-715.36) 6)  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 One By Mouth Two Times A Day 2)  Viagra 50 Mg Tabs (Sildenafil Citrate) .... One By Mouth As Directed 3)  Benicar Hct 20-12.5 Mg Tabs (Olmesartan Medoxomil-Hctz) .... One By Mouth Dialy 4)  Testosterone Cypionate 200 Mg/ml Oil (Testosterone Cypionate) .... 1/2 Cc Every Two Weeks 5)  Monoject Syringe 21g X 1-1/2" 3 Ml Misc (Syringe/needle (Disp)) .... Use For Testosterone Injections 6)  Ambien 5 Mg Tabs (Zolpidem Tartrate) .Marland Kitchen.. 1 At Bedtime As Needed Sleep  Current Medications (verified): 1)  Ibuprofen 800 Mg  Tabs (Ibuprofen) .... One By Mouth Two Times A Day 2)  Viagra 50 Mg Tabs (Sildenafil Citrate) .... One By Mouth As Directed 3)  Benicar Hct 20-12.5 Mg Tabs (Olmesartan Medoxomil-Hctz) .... One By Mouth Dialy 4)  Testosterone Cypionate 200 Mg/ml Oil (Testosterone Cypionate) .... 1/2 Cc Every Two Weeks 5)  Monoject Syringe 21g X 1-1/2" 3 Ml Misc (Syringe/needle (Disp)) .... Use For Testosterone Injections 6)  Ambien 5 Mg Tabs (Zolpidem Tartrate) .Marland Kitchen.. 1 At Bedtime As Needed Sleep  Allergies (verified): No Known Drug Allergies  Contraindications/Deferment of Procedures/Staging:    Test/Procedure: FLU VAX  Reason for deferment: patient declined   Past History:  Social History: Last updated: 03/17/2007 Married Former Smoker  Risk Factors: Smoking Status: quit (11/13/2009)  Past medical, surgical, family and social histories (including risk factors) reviewed, and no changes noted (except as noted below).  Past Medical History: Reviewed history from 12/01/2007 and no changes required. GERD Hypertension Osteoarthritis  Past Surgical History: Reviewed history from 01/13/2007 and no changes required. hand surgery  Family History: Reviewed history and  no changes required.  Social History: Reviewed history from 03/17/2007 and no changes required. Married Former Smoker  Review of Systems  The patient denies anorexia, fever, weight loss, weight gain, vision loss, decreased hearing, hoarseness, chest pain, syncope, dyspnea on exertion, peripheral edema, prolonged cough, headaches, hemoptysis, abdominal pain, melena, hematochezia, severe indigestion/heartburn, hematuria, incontinence, genital sores, muscle weakness, suspicious skin lesions, transient blindness, difficulty walking, depression, unusual weight change, abnormal bleeding, enlarged lymph nodes, angioedema, and breast masses.    Physical Exam  General:  Well-developed,well-nourished,in no acute distress; alert,appropriate and cooperative throughout examination  AA male Head:  normocephalic and atraumatic.   Eyes:  pupils equal and pupils round.   Ears:  R ear normal and L ear normal.   Nose:  no external deformity and no nasal discharge.   Mouth:  good dentition and pharynx pink and moist.   Neck:  supple.   Lungs:  normal respiratory effort and no wheezes.   Heart:  normal rate and regular rhythm.     Impression & Recommendations:  Problem # 1:  SLEEP APNEA (ICD-780.57) split study ( met criterion) Orders: Durable Medical Equipment (DME) set at 7 cm andf moniter  Problem # 2:  HYPERTENSION (ICD-401.9) stable refill medications His updated medication list for this problem includes:    Benicar Hct 20-12.5 Mg Tabs (Olmesartan medoxomil-hctz) ..... One by mouth dialy  BP today: 140/80 Prior BP: 140/80 (08/14/2009)  Prior 10 Yr Risk Heart Disease: 14 % (05/15/2009)  Labs Reviewed: K+: 3.9 (02/15/2009) Creat: : 1.2 (02/15/2009)   Chol: 179 (02/15/2009)   HDL: 41.90 (02/15/2009)   LDL: 121 (02/15/2009)   TG: 83.0 (02/15/2009)  Problem # 3:  INSOMNIA, CHRONIC (ICD-307.42) ambien  Problem # 4:  LOC OSTEOARTHROS NOT SPEC PRIM/SEC LOWER LEG (ICD-715.36)  Informed  consent obtained and then the right shoulder  joint was prepped in a sterile manor and 40 mg depo and 1/2 cc 1% lidocaine injected into the synovial space. After care discussed. Pt tolerated procedure well.  His updated medication list for this problem includes:    Ibuprofen 800 Mg Tabs (Ibuprofen) ..... One by mouth two times a day  Discussed use of medications, application of heat or cold, and exercises.   Orders: Joint Aspirate / Injection, Large (20610) Depo- Medrol 40mg  (J1030)  Complete Medication List: 1)  Ibuprofen 800 Mg Tabs (Ibuprofen) .... One by mouth two times a day 2)  Viagra 50 Mg Tabs (Sildenafil citrate) .... One by mouth as directed 3)  Benicar Hct 20-12.5 Mg Tabs (Olmesartan medoxomil-hctz) .... One by mouth dialy 4)  Testosterone Cypionate 200 Mg/ml Oil (Testosterone cypionate) .... 1/2 cc every two weeks 5)  Monoject Syringe 21g X 1-1/2" 3 Ml Misc (Syringe/needle (disp)) .... Use for testosterone injections 6)  Zolpidem Tartrate 5 Mg Tabs (Zolpidem tartrate) .... One by mouth q hs prn  Patient Instructions: 1)  Please schedule a follow-up appointment in 1 months. for synvisc 2)  [facility] on [address] ( right  under [address] [place]) 3)  take the sleep study and the order Prescriptions: ZOLPIDEM TARTRATE 5 MG TABS (ZOLPIDEM TARTRATE) one by mouth q HS prn  #30 x 5   Entered and Authorized by:   Stacie Glaze MD   Signed by:   Stacie Glaze MD on 11/13/2009   Method used:   Print then Give to Patient   RxID:   [id number]

## 2010-03-27 NOTE — Letter (Signed)
Summary: Medical Necessity and Order for CPAP  Medical Necessity and Order for CPAP   Imported By: Maryln Gottron 11/21/2009 10:05:53  _____________________________________________________________________  External Attachment:    Type:   Image     Comment:   External Document

## 2010-03-27 NOTE — Assessment & Plan Note (Signed)
Summary: 1 month rov/synvisc/njr pt rsc/njr   Vital Signs:  Patient profile:   55 year old male Height:      74 inches Weight:      208 pounds BMI:     26.80 Temp:     98.2 degrees F oral Pulse rate:   72 / minute Resp:     14 per minute BP sitting:   134 / 80  (left arm)  Vitals Entered By: Willy Eddy, LPN (December 26, 2009 3:43 PM) CC: synvisc injection in left knee-synvisc hylan g-f2-=lot [id number] exp 12-2011 Is Patient Diabetic? No   Primary Care Temeka Pore:  Stacie Glaze MD  CC:  synvisc injection in left knee-synvisc hylan g-f2-=lot [id number] exp 12-2011.  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) 9)  Memory Loss  (ICD-780.93) 10)  Liver Function Tests, Abnormal  (ICD-794.8) 11)  Physical Examination  (ICD-V70.0) 12)  Hypertension  (ICD-401.9) 13)  Gerd  (ICD-530.81) 14)  Meniscus Tear  (ICD-836.2)  Current Problems (verified): 1)  Insomnia, Chronic  (ICD-307.42) 2)  Sleep Apnea  (ICD-780.57) 3)  Testicular Hypofunction  (ICD-257.2) 4)  Erectile Dysfunction, Organic  (ICD-607.84) 5)  Osteoarthritis  (ICD-715.90) 6)  Loc Osteoarthros Not Spec Prim/sec Lower Leg  (ICD-715.36) 7)  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 One By Mouth Two Times A Day 2)  Viagra 50 Mg Tabs (Sildenafil Citrate) .... One By Mouth As Directed 3)  Benicar Hct 20-12.5 Mg Tabs (Olmesartan Medoxomil-Hctz) .... One By Mouth Dialy 4)  Testosterone Cypionate 200 Mg/ml Oil (Testosterone Cypionate) .... 1/2 Cc Every Two Weeks 5)  Monoject Syringe 21g X 1-1/2" 3 Ml Misc (Syringe/needle (Disp)) .... Use For Testosterone Injections 6)  Zolpidem Tartrate 5 Mg Tabs (Zolpidem Tartrate) .... One By Mouth Q Hs Prn  Current Medications (verified): 1)  Ibuprofen 800 Mg  Tabs (Ibuprofen) .... One By Mouth Two Times A Day 2)  Viagra 50 Mg Tabs (Sildenafil Citrate) .... One By Mouth As Directed 3)  Benicar Hct 20-12.5 Mg Tabs (Olmesartan Medoxomil-Hctz) .... One By Mouth Dialy 4)  Testosterone Cypionate 200 Mg/ml Oil (Testosterone Cypionate) .... 1/2 Cc Every Two Weeks 5)  Monoject Syringe 21g X 1-1/2" 3 Ml Misc (Syringe/needle (Disp)) .... Use For Testosterone Injections 6)  Zolpidem Tartrate 5 Mg Tabs (Zolpidem Tartrate) .... One By Mouth Q Hs Prn  Allergies (verified): No Known Drug Allergies  Past History:  Social History: Last updated: 03/17/2007 Married Former Smoker  Risk Factors: Smoking Status: quit (12/26/2009)  Past medical, surgical, family and social histories (including risk factors) reviewed, and no changes noted (except as noted below).  Past  Medical History: Reviewed history from 12/01/2007 and no changes required. GERD Hypertension Osteoarthritis  Past Surgical History: Reviewed history from 01/13/2007 and no changes required. hand surgery  Family History: Reviewed history and no changes required.  Social History: Reviewed history from 03/17/2007 and no changes required. Married Former Smoker  Physical Exam  General:  Well-developed,well-nourished,in no acute distress; alert,appropriate  and cooperative throughout examination  AA male Lungs:  normal respiratory effort and no wheezes.   Heart:  normal rate and regular rhythm.   Msk:  no joint swelling, no joint warmth, and no redness over joints.     Impression & Recommendations:  Problem # 1:  LOC OSTEOARTHROS NOT SPEC PRIM/SEC LOWER LEG (ICD-715.36)  Informed consent obtained and then the  left knee joint was prepped in a sterile manor and 2 cc of synvisc was  injected into the synovial space. After care discussed. Pt tolerated procedure well.  His updated medication list for this problem includes:    Ibuprofen 800 Mg Tabs (Ibuprofen) ..... One by mouth two times a day  Discussed use of medications, application of heat or cold, and exercises.   Orders: Joint Aspirate / Injection, Large (20610) Synvisc injection, 2 ml (A2130) No Charge Patient Arrived (NCPA0) (NCPA0)  Complete Medication List: 1)  Ibuprofen 800 Mg Tabs (Ibuprofen) .... One by mouth two times a day 2)  Viagra 50 Mg Tabs (Sildenafil citrate) .... One by mouth as directed 3)  Benicar Hct 20-12.5 Mg Tabs (Olmesartan medoxomil-hctz) .... One by mouth dialy 4)  Testosterone Cypionate 200 Mg/ml Oil (Testosterone cypionate) .... 1/2 cc every two weeks 5)  Monoject Syringe 21g X 1-1/2" 3 Ml Misc (Syringe/needle (disp)) .... Use for testosterone injections 6)  Zolpidem Tartrate 5 Mg Tabs (Zolpidem tartrate) .... One by mouth q hs prn  Patient Instructions: 1)  one week rov at 4 pm   Orders Added: 1)  Joint Aspirate / Injection, Large [20610] 2)  Synvisc injection, 2 ml [J7322] 3)  No Charge Patient Arrived (NCPA0) [NCPA0]

## 2010-03-27 NOTE — Assessment & Plan Note (Signed)
Summary: 1 week fup--ok per dr jenkins//ccm   Vital Signs:  Patient profile:   55 year old male Temp:     98.2 degrees F Pulse rate:   72 / minute Resp:     14 per minute BP sitting:   146 / 88  (left arm)  Vitals Entered By: Willy Eddy, LPN (January 02, 2010 3:07 PM) CC: 2nd synvisc injection bygenzyme- lot [id number]  exp 12/2011 Is Patient Diabetic? No   Primary Care Kalisa Girtman:  Stacie Glaze MD  CC:  2nd synvisc injection bygen[employer]- lot [id number]  exp 12/2011.  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 One By Mouth Two Times A Day 2)  Viagra 50 Mg Tabs (Sildenafil Citrate) .... One By Mouth As Directed 3)  Benicar Hct 20-12.5 Mg Tabs (Olmesartan Medoxomil-Hctz) .... One By Mouth Dialy 4)  Testosterone Cypionate 200 Mg/ml Oil (Testosterone Cypionate) .... 1/2 Cc Every Two Weeks 5)  Monoject Syringe 21g X 1-1/2" 3 Ml Misc (Syringe/needle (Disp)) .... Use For Testosterone Injections 6)  Zolpidem Tartrate 5 Mg Tabs (Zolpidem Tartrate) .... One By Mouth Q Hs Prn 7)  Amoxicillin 875 Mg Tabs (Amoxicillin) .Marland Kitchen.. 1 By Mouth Two Times A Day  Allergies  (verified): No Known Drug Allergies   Impression & Recommendations:  Problem # 1:  OSTEOARTHRITIS (ICD-715.90)  His updated medication list for this problem includes:    Ibuprofen 800 Mg Tabs (Ibuprofen) ..... One by mouth two times a day  Problem # 2:  LOC OSTEOARTHROS NOT SPEC PRIM/SEC LOWER LEG (ICD-715.36)  Informed consent obtained and then the right joint was prepped in a sterile manor and 40 mg depo and 1/2 cc 1% lidocaine injected into the synovial space. After care discussed. Pt tolerated procedure well.  His updated medication list for this problem includes:    Ibuprofen 800 Mg Tabs (Ibuprofen) ..... One by mouth two times a day  Discussed use of medications, application of heat or cold, and exercises.   Orders: No Charge Patient Arrived (NCPA0) (NCPA0) Synvisc injection, 2 ml (U9811) Joint Aspirate / Injection, Large (20610)  Complete Medication List: 1)  Ibuprofen 800 Mg Tabs (Ibuprofen) .... One by mouth two times a day 2)  Viagra 50 Mg Tabs (Sildenafil citrate) .... One by mouth as directed 3)  Benicar Hct 20-12.5 Mg Tabs (Olmesartan medoxomil-hctz) .... One by mouth dialy 4)  Testosterone Cypionate 200 Mg/ml Oil (Testosterone cypionate) .... 1/2 cc every two weeks 5)  Monoject Syringe 21g X 1-1/2" 3 Ml Misc (Syringe/needle (disp)) .... Use for testosterone injections 6)  Zolpidem Tartrate 5 Mg Tabs (Zolpidem tartrate) .... One by mouth q hs prn 7)  Amoxicillin 875 Mg  Tabs (Amoxicillin) .Marland Kitchen.. 1 by mouth two times a day  Patient Instructions: 1)  one week  Tuesday at 230   Orders Added: 1)  No Charge Patient Arrived (NCPA0) [NCPA0] 2)  Synvisc injection, 2 ml [J7322] 3)  Joint Aspirate / Injection, Large [20610]

## 2010-03-27 NOTE — Assessment & Plan Note (Signed)
Summary: 3 month fup//ccm   Vital Signs:  Patient profile:   55 year old male Height:      74 inches Weight:      208 pounds BMI:     26.80 Temp:     98.2 degrees F oral Pulse rate:   76 / minute Resp:     14 per minute BP sitting:   140 / 80  (left arm)  Vitals Entered By: Willy Eddy, LPN (August 14, 2009 3:41 PM) CC: roa labs, Hypertension Management   CC:  roa labs and Hypertension Management.  History of Present Illness: The pt has liver functions that are normal and a hx of chronic hepatitis the pt is taking testosterone  injection and one week after the shot his levels was 297.24 Has a soft area on the scalp that he feels is growing, non tender, no ulcers or bleeding He gets the shots every month   Hypertension History:      He denies headache, chest pain, palpitations, dyspnea with exertion, orthopnea, PND, peripheral edema, visual symptoms, neurologic problems, syncope, and side effects from treatment.        Positive major cardiovascular risk factors include male age 45 years old or older and hypertension.  Negative major cardiovascular risk factors include non-tobacco-user status.     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) 5)  Memory Loss  (ICD-780.93) 6)  Liver Function Tests, Abnormal  (ICD-794.8) 7)  Physical Examination  (ICD-V70.0) 8)  Hypertension  (ICD-401.9) 9)  Gerd  (ICD-530.81) 10)  Meniscus Tear  (ICD-836.2)  Current Problems (verified): 1)  Erectile Dysfunction, Organic  (ICD-607.84) 2)  Osteoarthritis  (ICD-715.90) 3)  Loc Osteoarthros Not Spec Prim/sec Lower Leg  (ICD-715.36) 4)  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 One By Mouth Two Times A Day 2)  Viagra 50 Mg Tabs (Sildenafil Citrate) .... One By Mouth As Directed 3)  Benicar Hct 20-12.5 Mg Tabs (Olmesartan Medoxomil-Hctz) .... One By Mouth Dialy 4)  Testosterone Cypionate 200 Mg/ml Oil (Testosterone Cypionate) .Marland Kitchen.. 1 Ml Every Month  Current Medications (verified): 1)  Ibuprofen 800 Mg  Tabs (Ibuprofen) .... One By Mouth Two Times A Day 2)  Viagra 50 Mg Tabs (Sildenafil Citrate) .... One By Mouth As Directed 3)  Benicar Hct 20-12.5 Mg Tabs (Olmesartan Medoxomil-Hctz) .... One By Mouth Dialy 4)  Testosterone Cypionate 200 Mg/ml Oil (Testosterone Cypionate) .... 1/2 Cc Every Two Weeks  Allergies (verified): No Known Drug Allergies  Past History:  Social History: Last updated: 03/17/2007 Married Former Smoker  Risk Factors: Smoking Status: quit (08/14/2009)  Past medical, surgical, family and social histories (including risk factors) reviewed, and no changes noted (except as noted below).  Past Medical History: Reviewed history from 12/01/2007 and no changes required. GERD Hypertension Osteoarthritis  Past Surgical History: Reviewed history from 01/13/2007 and no changes required. hand surgery  Family History: Reviewed history and no changes required.  Social History: Reviewed  history from 03/17/2007 and no changes required. Married Former Smoker  Review of Systems  The patient denies anorexia, fever, weight loss, weight gain, vision loss, decreased hearing, hoarseness, chest pain, syncope, dyspnea on exertion, peripheral edema, prolonged cough, headaches, hemoptysis, abdominal pain, melena, hematochezia, severe indigestion/heartburn, hematuria, incontinence, genital  sores, muscle weakness, suspicious skin lesions, transient blindness, difficulty walking, depression, unusual weight change, abnormal bleeding, enlarged lymph nodes, angioedema, breast masses, and testicular masses.         no testicula changes  Physical Exam  General:  Well-developed,well-nourished,in no acute distress; alert,appropriate and cooperative throughout examination  AA male Head:  normocephalic and atraumatic.   Eyes:  pupils equal and pupils round.   Ears:  R ear normal and L ear normal.   Neck:  supple.   Lungs:  normal respiratory effort and no wheezes.   Heart:  normal rate and regular rhythm.   Abdomen:  soft, non-tender, and normal bowel sounds.   Msk:  no joint warmth, decreased ROM, joint tenderness, and joint swelling.   Extremities:  No clubbing, cyanosis, edema, or deformity noted with normal full range of motion of all joints.   Neurologic:  alert & oriented X3 and cranial nerves II-XII intact.     Impression & Recommendations:  Problem # 1:  TESTICULAR HYPOFUNCTION (ICD-257.2)  Discussed proper use of medications, as well as side effects.   Problem # 2:  HYPERTENSION (ICD-401.9) Assessment: Unchanged  His updated medication list for this problem includes:    Benicar Hct 20-12.5 Mg Tabs (Olmesartan medoxomil-hctz) ..... One by mouth dialy  BP today: 140/80 Prior BP: 140/80 (05/15/2009)  Prior 10 Yr Risk Heart Disease: 14 % (05/15/2009)  Labs Reviewed: K+: 3.9 (02/15/2009) Creat: : 1.2 (02/15/2009)   Chol: 179 (02/15/2009)   HDL: 41.90 (02/15/2009)   LDL: 121 (02/15/2009)   TG: 83.0 (02/15/2009)  Problem # 3:  GERD (ICD-530.81)  Labs Reviewed: Hgb: 14.1 (02/15/2009)   Hct: 41.4 (02/15/2009)  Problem # 4:  LIVER FUNCTION TESTS, ABNORMAL (ICD-794.8)  Advised patient to avoid alcohol and Tylenol. Call for worsening symptoms.   Complete Medication List: 1)  Ibuprofen 800 Mg Tabs (Ibuprofen) .... One by mouth two times a day 2)  Viagra 50 Mg Tabs  (Sildenafil citrate) .... One by mouth as directed 3)  Benicar Hct 20-12.5 Mg Tabs (Olmesartan medoxomil-hctz) .... One by mouth dialy 4)  Testosterone Cypionate 200 Mg/ml Oil (Testosterone cypionate) .... 1/2 cc every two weeks  Hypertension Assessment/Plan:      The patient's hypertensive risk group is category B: At least one risk factor (excluding diabetes) with no target organ damage.  His calculated 10 year risk of coronary heart disease is 14 %.  Today's blood pressure is 140/80.  His blood pressure goal is < 140/90.  Patient Instructions: 1)  Please schedule a follow-up appointment in 3 months. 2)  take the shot 1/2 cc every two weeks 3)  testosterone levels   prior 257.2

## 2010-03-27 NOTE — Progress Notes (Signed)
Summary: sleep  Phone Note Call from Patient   CallerFarrell Ours, [phone] or c [phone] Reason for Call: Acute Illness Summary of Call: Has 7-19 appointment.  Falls asleep a lot & fast.  Concerned about him sleeping while driving.  May be due to meds or heat.  Should I move his appointment up?  Please call wife back. Wife called again saying part of problem is that he's not sleeping through the night.  Could we start with something to help him sleep Or recommend OTC for sleep.  [facility].  NKDA.  Has tried a couple things that made him too groggy to operate machine next day.  Wife doesn't know what.  Thinks it's on file.  Knows he can't take Tylenol PM because it makes him groggy.  Did tell her that he was starting to go to sleep today when he was driving,  coming from not getting good nights' rest & heat.  Rudy Jew, RN  September 20, 2009 2:44 PM  Initial call taken by: Rudy Jew, RN,  September 20, 2009 2:12 PM  Follow-up for Phone Call        ov with dr Kennith Gain to discuss and for possible sleep study Follow-up by: Willy Eddy, LPN,  September 20, 2009 2:48 PM     Appended Document: sleep per dr Lovell Sheehan- given Remus Loffler 5 mg at bedtime as needed sleep and set up sleep study-[facility]  Appended Document: Orders Update    Clinical Lists Changes  Problems: Added new problem of SLEEP APNEA (ICD-780.57) Medications: Added new medication of AMBIEN 5 MG TABS (ZOLPIDEM TARTRATE) 1 at bedtime as needed sleep - Signed Rx of AMBIEN 5 MG TABS (ZOLPIDEM TARTRATE) 1 at bedtime as needed sleep;  #30 x 0;  Signed;  Entered by: Willy Eddy, LPN;  Authorized by: Stacie Glaze MD;  Method used: [facility] to [facility] [phone]*, [address] [place], [place], [place]  [address], Ph: [phone], Fax: [phone] Orders: Added new Referral order of Sleep Disorder Referral (Sleep Disorder) - Signed    Prescriptions: AMBIEN 5 MG TABS (ZOLPIDEM  TARTRATE) 1 at bedtime as needed sleep  #30 x 0   Entered by:   Willy Eddy, LPN   Authorized by:   Stacie Glaze MD   Signed by:   Willy Eddy, LPN on 40/11/2723   Method used:   Telephoned to ...       [id number]       [address]       [place], [place]  [address]       Ph: [phone]       Fax: [phone]   RxID:   [id number]

## 2010-04-09 ENCOUNTER — Encounter: Payer: Self-pay | Admitting: Internal Medicine

## 2010-04-09 ENCOUNTER — Ambulatory Visit (INDEPENDENT_AMBULATORY_CARE_PROVIDER_SITE_OTHER): Payer: Managed Care, Other (non HMO) | Admitting: Internal Medicine

## 2010-04-09 VITALS — BP 136/80 | HR 72 | Temp 98.1°F | Resp 14 | Ht 74.0 in | Wt 206.0 lb

## 2010-04-09 DIAGNOSIS — G4700 Insomnia, unspecified: Secondary | ICD-10-CM

## 2010-04-09 DIAGNOSIS — E291 Testicular hypofunction: Secondary | ICD-10-CM

## 2010-04-09 DIAGNOSIS — I10 Essential (primary) hypertension: Secondary | ICD-10-CM

## 2010-04-09 DIAGNOSIS — Z0000 Encounter for general adult medical examination without abnormal findings: Secondary | ICD-10-CM

## 2010-04-09 MED ORDER — ZOLPIDEM TARTRATE 5 MG PO TABS: 5.0000 mg | ORAL_TABLET | Freq: Every evening | ORAL | Status: DC | PRN

## 2010-04-09 NOTE — Assessment & Plan Note (Signed)
Patient presents for yearly preventative medicine examination.   all immunizations and health maintenance protocols were reviewed with the patient and they are up to date with these protocols.   screening laboratory values were reviewed with the patient including screening of hyperlipidemia PSA renal function and hepatic function.   There medications past medical history social history problem list and allergies were reviewed in detail.   Goals were established with regard to weight loss exercise diet in compliance with medications  

## 2010-04-09 NOTE — Assessment & Plan Note (Signed)
Recommend continuing the testosterone for now

## 2010-04-09 NOTE — Assessment & Plan Note (Addendum)
Stable  On benicar samples... Had cough on ace

## 2010-04-09 NOTE — Progress Notes (Signed)
  Subjective:    Patient ID: Nathan Stewart, male    DOB: 03-21-1955, 55 y.o.   MRN: [id number]  HPI   Patient presents for complete physical examinations with laboratory values drawn prior to physical exam.   chronic medical problems for which we monitored his wellness examination include hypertension hyperlipidemia elevated liver functions due to chronic hepatitis C and testicular hypofunction.   review of his laboratory work with patient today should his liver functions are stable his cholesterol is near goal the rest of his blood work was unremarkable. He is taking all his current medications as prescribed. He is up-to-date with all Review of Systems  Constitutional: Negative for fever and fatigue.  HENT: Negative for hearing loss, congestion, neck pain and postnasal drip.   Eyes: Negative for discharge, redness and visual disturbance.  Respiratory: Negative for cough, shortness of breath and wheezing.   Cardiovascular: Negative for leg swelling.  Gastrointestinal: Negative for abdominal pain, constipation and abdominal distention.  Genitourinary: Negative for urgency and frequency.  Musculoskeletal: Negative for joint swelling and arthralgias.  Skin: Negative for color change and rash.  Neurological: Negative for weakness and light-headedness.  Hematological: Negative for adenopathy.  Psychiatric/Behavioral: Negative for behavioral problems.       Objective:   Physical Exam  Constitutional: He is oriented to person, place, and time. He appears well-developed and well-nourished.  HENT:  Head: Normocephalic and atraumatic.  Eyes: Conjunctivae are normal. Pupils are equal, round, and reactive to light.  Neck: Normal range of motion. Neck supple.  Cardiovascular: Normal rate and regular rhythm.   Pulmonary/Chest: Effort normal and breath sounds normal.  Abdominal: Soft. Bowel sounds are normal.  Genitourinary: Rectum normal, prostate normal and penis normal.  Neurological: He is  alert and oriented to person, place, and time. He has normal reflexes.  Skin: Skin is warm and dry.  Psychiatric: He has a normal mood and affect.       I have reviewed this patient's past medical history surgical history social history current medication list and current allergy list and have documented appropriate changes as necessary     Assessment & Plan:   C. Probable could exam that

## 2010-08-08 ENCOUNTER — Ambulatory Visit: Payer: Managed Care, Other (non HMO) | Admitting: Internal Medicine

## 2010-08-13 ENCOUNTER — Ambulatory Visit (INDEPENDENT_AMBULATORY_CARE_PROVIDER_SITE_OTHER): Payer: Managed Care, Other (non HMO) | Admitting: Internal Medicine

## 2010-08-13 DIAGNOSIS — R748 Abnormal levels of other serum enzymes: Secondary | ICD-10-CM

## 2010-08-13 DIAGNOSIS — R7401 Elevation of levels of liver transaminase levels: Secondary | ICD-10-CM

## 2010-08-13 DIAGNOSIS — I10 Essential (primary) hypertension: Secondary | ICD-10-CM

## 2010-08-13 DIAGNOSIS — L259 Unspecified contact dermatitis, unspecified cause: Secondary | ICD-10-CM

## 2010-08-13 DIAGNOSIS — E291 Testicular hypofunction: Secondary | ICD-10-CM

## 2010-08-13 MED ORDER — BETAMETHASONE DIPROPIONATE 0.05 % EX OINT: TOPICAL_OINTMENT | Freq: Two times a day (BID) | CUTANEOUS | Status: DC

## 2010-08-14 LAB — BASIC METABOLIC PANEL
BUN: 29 mg/dL — ABNORMAL HIGH (ref 6–23)
Chloride: 106 mEq/L (ref 96–112)
Creatinine, Ser: 1.3 mg/dL (ref 0.4–1.5)

## 2010-08-14 LAB — HEPATIC FUNCTION PANEL
ALT: 41 U/L (ref 0–53)
Total Bilirubin: 0.7 mg/dL (ref 0.3–1.2)

## 2010-08-14 LAB — TESTOSTERONE: Testosterone: 339.63 ng/dL — ABNORMAL LOW (ref 350.00–890.00)

## 2010-09-07 ENCOUNTER — Telehealth: Payer: Self-pay | Admitting: *Deleted

## 2010-09-07 MED ORDER — CIPROFLOXACIN HCL 500 MG PO TABS: 500.0000 mg | ORAL_TABLET | Freq: Two times a day (BID) | ORAL | Status: AC

## 2010-09-07 NOTE — Telephone Encounter (Signed)
Pt"s wife states that her husband is having frequent urination with right sided low back pain???

## 2010-09-07 NOTE — Telephone Encounter (Signed)
Per dr Lovell Sheehan- may  Have cipro 500 bid For 14 days

## 2010-09-07 NOTE — Telephone Encounter (Signed)
Left message to have pt pick up meds.

## 2010-11-14 ENCOUNTER — Ambulatory Visit (INDEPENDENT_AMBULATORY_CARE_PROVIDER_SITE_OTHER): Payer: Managed Care, Other (non HMO) | Admitting: Internal Medicine

## 2010-11-14 VITALS — BP 146/82 | HR 76 | Temp 98.2°F | Resp 16 | Ht 74.0 in | Wt 198.0 lb

## 2010-11-14 DIAGNOSIS — G4700 Insomnia, unspecified: Secondary | ICD-10-CM

## 2010-11-14 DIAGNOSIS — K739 Chronic hepatitis, unspecified: Secondary | ICD-10-CM

## 2010-11-14 DIAGNOSIS — E291 Testicular hypofunction: Secondary | ICD-10-CM

## 2010-11-14 MED ORDER — ZOLPIDEM TARTRATE 5 MG PO TABS: 5.0000 mg | ORAL_TABLET | Freq: Every evening | ORAL | Status: DC | PRN

## 2010-11-14 MED ORDER — OLMESARTAN MEDOXOMIL-HCTZ 40-12.5 MG PO TABS: 1.0000 | ORAL_TABLET | Freq: Every day | ORAL | Status: DC

## 2010-11-14 MED ORDER — TESTOSTERONE CYPIONATE 200 MG/ML IM SOLN: INTRAMUSCULAR | Status: DC

## 2010-11-22 NOTE — Patient Instructions (Signed)
Patient was instructed to continue all medications as prescribed. To stop at the checkout desk and schedule a followup appointment  

## 2010-11-22 NOTE — Progress Notes (Signed)
  Subjective:    Patient ID: Nathan Stewart, male    DOB: 04-Dec-1955, 55 y.o.   MRN: [id number]  HPI  Patient is a 55 year old African American male is followed for hypogonadism osteoarthritis gastroesophageal reflux and a history of chronic hepatitis B  He has been doing remarkably well for his liver functions have been essentially normal his GERD is controlled as long as he takes a proton pump inhibitor we have been monitoring him for testicular hypofunction and replacement with testosterone he still has significant erectile dysfunction resulting from his hypogonadism.    Review of Systems  Constitutional: Negative for fever and fatigue.  HENT: Negative for hearing loss, congestion, neck pain and postnasal drip.   Eyes: Negative for discharge, redness and visual disturbance.  Respiratory: Negative for cough, shortness of breath and wheezing.   Cardiovascular: Negative for leg swelling.  Gastrointestinal: Negative for abdominal pain, constipation and abdominal distention.  Genitourinary: Negative for urgency and frequency.  Musculoskeletal: Negative for joint swelling and arthralgias.  Skin: Negative for color change and rash.  Neurological: Negative for weakness and light-headedness.  Hematological: Negative for adenopathy.  Psychiatric/Behavioral: Negative for behavioral problems.   Past Medical History  Diagnosis Date  . TESTICULAR HYPOFUNCTION 08/14/2009  . INSOMNIA, CHRONIC 11/13/2009  . GERD 01/01/2007  . 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 GEN OSTEOARTHROSIS INVOLVING MULTIPLE SITES 11/10/2007  . LOC OSTEOARTHROS NOT SPEC PRIM/SEC LOWER LEG 11/10/2007  . OSTEOARTHRITIS 06/07/2008  . SLEEP APNEA 09/20/2009  . Memory loss 08/27/2007  . LIVER FUNCTION TESTS, ABNORMAL 08/27/2007  . MENISCUS TEAR 10/30/2006   Past Surgical History  Procedure Date  . Hand surgery     has an unknown smoking status. He does not have any smokeless tobacco history on file. His alcohol and drug  histories not on file. family history is not on file. No Known Allergies     Objective:   Physical Exam  Nursing note and vitals reviewed. Constitutional: He appears well-developed and well-nourished.  HENT:  Head: Normocephalic and atraumatic.  Eyes: Conjunctivae are normal. Pupils are equal, round, and reactive to light.  Neck: Normal range of motion. Neck supple.  Cardiovascular: Normal rate and regular rhythm.   Pulmonary/Chest: Effort normal and breath sounds normal.  Abdominal: Soft. Bowel sounds are normal.          Assessment & Plan:  Monitoring of testosterone discussing use of axillary testosterone preparation for better testosterone levels.  Samples of medications and test of the medications will pursuit.  He drinks samples were given to the patient.  Monitoring of liver functions are GERD stable on current medications osteoarthritis stable warning him to avoid excessive use of nonsteroidals due to his history of hepatitis

## 2011-04-03 NOTE — Progress Notes (Signed)
  Subjective:    Patient ID: Nathan Stewart, male    DOB: [birth year], 56 y.o.   MRN: [id number]  HPI    Review of Systems     Objective:   Physical Exam        Assessment & Plan:

## 2011-04-05 ENCOUNTER — Other Ambulatory Visit (INDEPENDENT_AMBULATORY_CARE_PROVIDER_SITE_OTHER): Payer: Managed Care, Other (non HMO)

## 2011-04-05 DIAGNOSIS — Z0000 Encounter for general adult medical examination without abnormal findings: Secondary | ICD-10-CM

## 2011-04-05 LAB — CBC WITH DIFFERENTIAL/PLATELET
Basophils Absolute: 0 10*3/uL (ref 0.0–0.1)
Basophils Relative: 0.6 % (ref 0.0–3.0)
Eosinophils Relative: 1.7 % (ref 0.0–5.0)
HCT: 44.3 % (ref 39.0–52.0)
Hemoglobin: 15.2 g/dL (ref 13.0–17.0)
Lymphocytes Relative: 27.7 % (ref 12.0–46.0)
Monocytes Relative: 8.3 % (ref 3.0–12.0)
Neutro Abs: 3.9 10*3/uL (ref 1.4–7.7)
RBC: 4.72 Mil/uL (ref 4.22–5.81)
RDW: 12.6 % (ref 11.5–14.6)
WBC: 6.3 10*3/uL (ref 4.5–10.5)

## 2011-04-05 LAB — BASIC METABOLIC PANEL
Calcium: 9.8 mg/dL (ref 8.4–10.5)
GFR: 115.25 mL/min (ref >60.00)
Potassium: 4.2 mEq/L (ref 3.5–5.1)
Sodium: 137 mEq/L (ref 135–145)

## 2011-04-05 LAB — POCT URINALYSIS DIPSTICK
Glucose, UA: NEGATIVE
Ketones, UA: NEGATIVE
Leukocytes, UA: NEGATIVE
Protein, UA: NEGATIVE
Urobilinogen, UA: 0.2

## 2011-04-05 LAB — HEPATIC FUNCTION PANEL
ALT: 32 U/L (ref 0–53)
AST: 24 U/L (ref 0–37)
Albumin: 4.7 g/dL (ref 3.5–5.2)
Alkaline Phosphatase: 44 U/L (ref 39–117)

## 2011-04-05 LAB — LIPID PANEL: VLDL: 17.2 mg/dL (ref 0.0–40.0)

## 2011-04-05 LAB — PSA: PSA: 0.69 ng/mL (ref 0.10–4.00)

## 2011-04-12 ENCOUNTER — Encounter: Payer: Self-pay | Admitting: Internal Medicine

## 2011-04-12 ENCOUNTER — Other Ambulatory Visit: Payer: Self-pay | Admitting: *Deleted

## 2011-04-12 ENCOUNTER — Ambulatory Visit (INDEPENDENT_AMBULATORY_CARE_PROVIDER_SITE_OTHER): Payer: Managed Care, Other (non HMO) | Admitting: Internal Medicine

## 2011-04-12 DIAGNOSIS — I10 Essential (primary) hypertension: Secondary | ICD-10-CM

## 2011-04-12 DIAGNOSIS — E291 Testicular hypofunction: Secondary | ICD-10-CM

## 2011-04-12 MED ORDER — TESTOSTERONE 30 MG/ACT TD SOLN: 1.0000 "application " | Freq: Every day | TRANSDERMAL | Status: DC

## 2011-04-12 NOTE — Patient Instructions (Signed)
Change from the shots to the underarm

## 2011-04-12 NOTE — Progress Notes (Signed)
Subjective:    Patient ID: Nathan Stewart, male    DOB: 06/19/1955, 56 y.o.   MRN: [id number]  HPI Patient is a 56 year old African American male who presents for complete physical examination.  He is followed for a history of elevated liver functions he is followed for testicular hypofunction with erectile dysfunction.  And he has generalized osteoarthritis he presents today for his annual examination he has no complaints of chest pain shortness of breath PND orthopnea he does have a complaint about the injections of the testosterone but this is difficult for him to schedule and that he has missed several doses.  He is asking for alternatives to injection therapy   Review of Systems  Constitutional: Negative for fever and fatigue.  HENT: Negative for hearing loss, congestion, neck pain and postnasal drip.   Eyes: Negative for discharge, redness and visual disturbance.  Respiratory: Negative for cough, shortness of breath and wheezing.   Cardiovascular: Negative for leg swelling.  Gastrointestinal: Negative for abdominal pain, constipation and abdominal distention.  Genitourinary: Negative for urgency and frequency.  Musculoskeletal: Negative for joint swelling and arthralgias.  Skin: Negative for color change and rash.  Neurological: Negative for weakness and light-headedness.  Hematological: Negative for adenopathy.  Psychiatric/Behavioral: Negative for behavioral problems.       Past Medical History  Diagnosis Date  . TESTICULAR HYPOFUNCTION 08/14/2009  . INSOMNIA, CHRONIC 11/13/2009  . GERD 01/01/2007  . 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 GEN OSTEOARTHROSIS INVOLVING MULTIPLE SITES 11/10/2007  . LOC OSTEOARTHROS NOT SPEC PRIM/SEC LOWER LEG 11/10/2007  . OSTEOARTHRITIS 06/07/2008  . SLEEP APNEA 09/20/2009  . Memory loss 08/27/2007  . LIVER FUNCTION TESTS, ABNORMAL 08/27/2007  . MENISCUS TEAR 10/30/2006    History   Social History  . Marital Status: Married    Spouse  Name: N/A    Number of Children: N/A  . Years of Education: N/A   Occupational History  . Not on file.   Social History Main Topics  . Smoking status: Unknown If Ever Smoked  . Smokeless tobacco: Not on file  . Alcohol Use: Not on file  . Drug Use: Not on file  . Sexually Active: Not on file   Other Topics Concern  . Not on file   Social History Narrative  . No narrative on file    Past Surgical History  Procedure Date  . Hand surgery     No family history on file.  No Known Allergies  Current Outpatient Prescriptions on File Prior to Visit  Medication Sig Dispense Refill  . ibuprofen (ADVIL,MOTRIN) 800 MG tablet Take 800 mg by mouth 2 (two) times daily as needed.        Marland Kitchen olmesartan-hydrochlorothiazide (BENICAR HCT) 40-12.5 MG per tablet Take 1 tablet by mouth daily.      Marland Kitchen zolpidem (AMBIEN) 5 MG tablet Take 1 tablet (5 mg total) by mouth at bedtime as needed.  30 tablet  5    BP 140/80  Pulse 80  Temp 98.5 F (36.9 C)  Resp 16  Ht 6\' 2"  (1.88 m)  Wt 200 lb (90.719 kg)  BMI 25.68 kg/m2    Objective:   Physical Exam  Nursing note and vitals reviewed. Constitutional: He is oriented to person, place, and time. He appears well-developed and well-nourished.  HENT:  Head: Normocephalic and atraumatic.  Eyes: Conjunctivae are normal. Pupils are equal, round, and reactive to light.  Neck: Normal range of motion. Neck supple.  Cardiovascular: Normal rate and  regular rhythm.   Pulmonary/Chest: Effort normal and breath sounds normal.  Abdominal: Soft. Bowel sounds are normal.  Genitourinary: Rectum normal and prostate normal.  Musculoskeletal: Normal range of motion. He exhibits tenderness.  Neurological: He is alert and oriented to person, place, and time. He has normal reflexes.  Skin: Skin is warm and dry.  Psychiatric: He has a normal mood and affect. His behavior is normal.          Assessment & Plan:    Patient presents for yearly preventative  medicine examination.   all immunizations and health maintenance protocols were reviewed with the patient and they are up to date with these protocols.   screening laboratory values were reviewed with the patient including screening of hyperlipidemia PSA renal function and hepatic function.   There medications past medical history social history problem list and allergies were reviewed in detail.   Goals were established with regard to weight loss exercise diet in compliance with medications  Patient will change from injectable testosterone to axillary applied testosterone. We have given him samples of axiron and he will try this for the next 30 days we've also given a prescription.  We will continue to give him samples of Benicar although the cordis may be on his formulary and we confirmed the Micardis HCT as well as formulary we will transition him over to ARB.  He is on an ARB because he developed a cough on ACE inhibitors

## 2011-05-28 ENCOUNTER — Other Ambulatory Visit: Payer: Self-pay | Admitting: *Deleted

## 2011-05-28 DIAGNOSIS — G4700 Insomnia, unspecified: Secondary | ICD-10-CM

## 2011-05-28 MED ORDER — ZOLPIDEM TARTRATE 5 MG PO TABS: 5.0000 mg | ORAL_TABLET | Freq: Every evening | ORAL | Status: DC | PRN

## 2011-08-12 ENCOUNTER — Ambulatory Visit (INDEPENDENT_AMBULATORY_CARE_PROVIDER_SITE_OTHER): Payer: Managed Care, Other (non HMO) | Admitting: Internal Medicine

## 2011-08-12 VITALS — BP 130/80 | HR 68 | Temp 98.2°F | Resp 16 | Ht 74.0 in | Wt 196.0 lb

## 2011-08-12 DIAGNOSIS — G4700 Insomnia, unspecified: Secondary | ICD-10-CM

## 2011-08-12 DIAGNOSIS — E291 Testicular hypofunction: Secondary | ICD-10-CM

## 2011-08-12 DIAGNOSIS — I10 Essential (primary) hypertension: Secondary | ICD-10-CM

## 2011-08-12 MED ORDER — ESTAZOLAM 2 MG PO TABS
2.0000 mg | ORAL_TABLET | Freq: Every day | ORAL | Status: DC
Start: 2011-08-12 — End: 2011-10-04

## 2011-08-12 MED ORDER — TESTOSTERONE CYPIONATE 200 MG/ML IM SOLN: INTRAMUSCULAR | Status: DC

## 2011-08-12 NOTE — Patient Instructions (Signed)
The patient is instructed to continue all medications as prescribed. Schedule followup with check out clerk upon leaving the clinic  

## 2011-08-12 NOTE — Progress Notes (Signed)
Subjective:    Patient ID: Nathan Stewart, male    DOB: 1955/03/12, 56 y.o.   MRN: [id number]  HPI Follow up hypogonadism HTN stable Increased episodic low back pain after strain Primary complaint is insomnia and fatigue   Review of Systems  Constitutional: Positive for fatigue. Negative for fever.  HENT: Negative for hearing loss, congestion, neck pain and postnasal drip.   Eyes: Negative for discharge, redness and visual disturbance.  Respiratory: Negative for cough, shortness of breath and wheezing.   Cardiovascular: Negative for leg swelling.  Gastrointestinal: Negative for abdominal pain, constipation and abdominal distention.  Genitourinary: Negative for urgency and frequency.  Musculoskeletal: Positive for joint swelling. Negative for arthralgias.  Skin: Negative for color change and rash.  Neurological: Negative for weakness and light-headedness.  Hematological: Negative for adenopathy.  Psychiatric/Behavioral: Positive for disturbed wake/sleep cycle. Negative for behavioral problems. The patient is nervous/anxious.    Past Medical History  Diagnosis Date  . TESTICULAR HYPOFUNCTION 08/14/2009  . INSOMNIA, CHRONIC 11/13/2009  . GERD 01/01/2007  . 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 GEN OSTEOARTHROSIS INVOLVING MULTIPLE SITES 11/10/2007  . LOC OSTEOARTHROS NOT SPEC PRIM/SEC LOWER LEG 11/10/2007  . OSTEOARTHRITIS 06/07/2008  . SLEEP APNEA 09/20/2009  . Memory loss 08/27/2007  . LIVER FUNCTION TESTS, ABNORMAL 08/27/2007  . MENISCUS TEAR 10/30/2006    History   Social History  . Marital Status: Married    Spouse Name: N/A    Number of Children: N/A  . Years of Education: N/A   Occupational History  . Not on file.   Social History Main Topics  . Smoking status: Unknown If Ever Smoked  . Smokeless tobacco: Not on file  . Alcohol Use: Not on file  . Drug Use: Not on file  . Sexually Active: Not on file   Other Topics Concern  . Not on file   Social History  Narrative  . No narrative on file    Past Surgical History  Procedure Date  . Hand surgery     No family history on file.  No Known Allergies  Current Outpatient Prescriptions on File Prior to Visit  Medication Sig Dispense Refill  . ibuprofen (ADVIL,MOTRIN) 800 MG tablet Take 800 mg by mouth 2 (two) times daily as needed.        Marland Kitchen olmesartan-hydrochlorothiazide (BENICAR HCT) 40-12.5 MG per tablet Take 1 tablet by mouth daily.      Marland Kitchen estazolam (PROSOM) 2 MG tablet Take 2 mg by mouth at bedtime.      Marland Kitchen testosterone cypionate (DEPO-TESTOSTERONE) 200 MG/ML injection 1/2ml every 2 weeks  10 mL  3    BP 130/80  Pulse 68  Temp 98.2 F (36.8 C)  Resp 16  Ht 6\' 2"  (1.88 m)  Wt 196 lb (88.905 kg)  BMI 25.16 kg/m2       Objective:   Physical Exam  Nursing note and vitals reviewed. Constitutional: He appears well-developed and well-nourished.  HENT:  Head: Normocephalic and atraumatic.  Eyes: Conjunctivae normal are normal. Pupils are equal, round, and reactive to light.  Neck: Normal range of motion. Neck supple.  Cardiovascular: Normal rate and regular rhythm.   Pulmonary/Chest: Effort normal and breath sounds normal.  Abdominal: Soft. Bowel sounds are normal.          Assessment & Plan:  We discussed testosterone replacement the patient wishes to resume testosterone injections we recommended 100 mg twice a month. His insomnia is his primary complaint and we will prescribe ProSom  2 mg by mouth each bedtime his blood pressure is stable on his current medications and is compliant with his medications he has mild to moderate back and neck pain for which she is undergoing physical therapy and exercise therapy and tramadol as appropriate

## 2011-09-30 ENCOUNTER — Telehealth: Payer: Self-pay | Admitting: Internal Medicine

## 2011-09-30 NOTE — Telephone Encounter (Signed)
Caller: Jan/Spouse; PCP: Darryll Capers; CB#: [phone];  Call regarding Back Pain that started 09/29/11. He wants something called in for back pain.  She is not with the pt and he cannot be reached at this time.  He will call office  back on his lunch hour.

## 2011-09-30 NOTE — Telephone Encounter (Signed)
Caller: Mesiah/Patient; PCP: Darryll Capers; CB#: [phone]; ; ; Call regarding Back Symptoms; states was using the weed eater 09/28/11 for about 3 hours' work, and his back has been painful since then.  States pain is worse on the left side than the right.  Afebrile.  Per protocol, emergent symptoms denied; advised appointment within 24 hours.  Appointment declined; wants Rx called in. RN advised to be seen, as Rx not written without exam; patient states will try home care measures and call back if not improved.

## 2011-10-04 ENCOUNTER — Encounter: Payer: Self-pay | Admitting: Internal Medicine

## 2011-10-04 ENCOUNTER — Ambulatory Visit (HOSPITAL_COMMUNITY)
Admission: RE | Admit: 2011-10-04 | Discharge: 2011-10-04 | Disposition: A | Discharge status: Home / Self Care | Payer: Managed Care, Other (non HMO) | Source: Ambulatory Visit | Attending: Internal Medicine | Admitting: Internal Medicine

## 2011-10-04 ENCOUNTER — Ambulatory Visit (INDEPENDENT_AMBULATORY_CARE_PROVIDER_SITE_OTHER): Payer: Managed Care, Other (non HMO) | Admitting: Internal Medicine

## 2011-10-04 VITALS — BP 160/90 | HR 76 | Temp 98.2°F | Resp 16 | Ht 74.0 in | Wt 194.0 lb

## 2011-10-04 DIAGNOSIS — IMO0002 Reserved for concepts with insufficient information to code with codable children: Secondary | ICD-10-CM

## 2011-10-04 DIAGNOSIS — M545 Low back pain, unspecified: Secondary | ICD-10-CM | POA: Insufficient documentation

## 2011-10-04 DIAGNOSIS — M5416 Radiculopathy, lumbar region: Secondary | ICD-10-CM

## 2011-10-04 MED ORDER — TRAMADOL HCL 50 MG PO TABS: 50.0000 mg | ORAL_TABLET | Freq: Three times a day (TID) | ORAL | Status: DC | PRN

## 2011-10-04 MED ORDER — CYCLOBENZAPRINE HCL 10 MG PO TABS: 10.0000 mg | ORAL_TABLET | Freq: Three times a day (TID) | ORAL | Status: DC | PRN

## 2011-10-04 MED ORDER — PREDNISONE 20 MG PO TABS: 20.0000 mg | ORAL_TABLET | Freq: Every day | ORAL | Status: AC

## 2011-10-04 NOTE — Progress Notes (Signed)
Subjective:    Patient ID: Nathan Stewart, male    DOB: 10/08/1955, 56 y.o.   MRN: [id number]  HPI Patient is a 56 year old male with a one-week history of acute back pain with a history of prior back pain.  States that the back pain is sharp in nature and radiates down his leg it occurred after using a weed eater.  He is to blood pressure is elevated today secondary to the pain he reports that the pain is an 8/10  He was unable to work secondary to the pain   Review of Systems  Constitutional: Negative for fever and fatigue.  HENT: Negative for hearing loss, congestion, neck pain and postnasal drip.   Eyes: Negative for discharge, redness and visual disturbance.  Respiratory: Negative for cough, shortness of breath and wheezing.   Cardiovascular: Negative for leg swelling.  Gastrointestinal: Negative for abdominal pain, constipation and abdominal distention.  Genitourinary: Negative for urgency and frequency.  Musculoskeletal: Positive for myalgias, back pain and joint swelling. Negative for arthralgias.  Skin: Negative for color change and rash.  Neurological: Negative for weakness and light-headedness.  Hematological: Negative for adenopathy.  Psychiatric/Behavioral: Negative for behavioral problems.   Past Medical History  Diagnosis Date  . TESTICULAR HYPOFUNCTION 08/14/2009  . INSOMNIA, CHRONIC 11/13/2009  . GERD 01/01/2007  . 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 GEN OSTEOARTHROSIS INVOLVING MULTIPLE SITES 11/10/2007  . LOC OSTEOARTHROS NOT SPEC PRIM/SEC LOWER LEG 11/10/2007  . OSTEOARTHRITIS 06/07/2008  . SLEEP APNEA 09/20/2009  . Memory loss 08/27/2007  . LIVER FUNCTION TESTS, ABNORMAL 08/27/2007  . MENISCUS TEAR 10/30/2006    History   Social History  . Marital Status: Married    Spouse Name: N/A    Number of Children: N/A  . Years of Education: N/A   Occupational History  . Not on file.   Social History Main Topics  . Smoking status: Unknown If Ever Smoked    . Smokeless tobacco: Not on file  . Alcohol Use: Not on file  . Drug Use: Not on file  . Sexually Active: Not on file   Other Topics Concern  . Not on file   Social History Narrative  . No narrative on file    Past Surgical History  Procedure Date  . Hand surgery     No family history on file.  No Known Allergies  Current Outpatient Prescriptions on File Prior to Visit  Medication Sig Dispense Refill  . ibuprofen (ADVIL,MOTRIN) 800 MG tablet Take 800 mg by mouth 2 (two) times daily as needed.        Marland Kitchen olmesartan-hydrochlorothiazide (BENICAR HCT) 40-12.5 MG per tablet Take 1 tablet by mouth daily.      Marland Kitchen testosterone cypionate (DEPO-TESTOSTERONE) 200 MG/ML injection 1/50ml every 2 weeks  10 mL  3  . DISCONTD: estazolam (PROSOM) 2 MG tablet Take 1 tablet (2 mg total) by mouth at bedtime.  30 tablet  3    BP 160/90  Pulse 76  Temp 98.2 F (36.8 C)  Resp 16  Ht 6\' 2"  (1.88 m)  Wt 194 lb (87.998 kg)  BMI 24.91 kg/m2       Objective:   Physical Exam  Nursing note and vitals reviewed. Constitutional: He appears well-developed and well-nourished.  HENT:  Head: Normocephalic and atraumatic.  Eyes: Conjunctivae are normal. Pupils are equal, round, and reactive to light.  Neck: Normal range of motion. Neck supple.  Cardiovascular: Normal rate and regular rhythm.   Pulmonary/Chest: Effort normal  and breath sounds normal.  Abdominal: Soft. Bowel sounds are normal.          Assessment & Plan:  We'll send him for LS spine x-rays will have him start a prednisone Dosepak use Flexeril as a muscle relaxant and Ultram for pain control.  If the LS-spine shows acute joint space narrowing an MRI would be the next step if the pain persists after a course of these medications.  Patient's blood pressure elevation is most probably secondary to pain but we'll monitor

## 2011-10-04 NOTE — Patient Instructions (Signed)
Go to the xray today

## 2011-10-08 ENCOUNTER — Telehealth: Payer: Self-pay | Admitting: Internal Medicine

## 2011-10-08 ENCOUNTER — Other Ambulatory Visit: Payer: Self-pay | Admitting: Internal Medicine

## 2011-10-08 DIAGNOSIS — M545 Low back pain, unspecified: Secondary | ICD-10-CM

## 2011-10-08 NOTE — Telephone Encounter (Signed)
Caller: [name]/Patient; Patient Name: Nathan Stewart; PCP: Darryll Capers; Best Callback Phone Number: [phone]       Started low back pain on Sun 09/29/2011.  Seen in office Dr Lovell Sheehan on Fri 8/9 with back pain, numbness in leg.  Sent for Xrays of back.  Still noting increased pain when standing as he does all day at work.  Will notice this is holding his left side after standing for some time.   Still numbness in Left ankle and toes.  Pain radiates into thigh still. Has not gotten report from Xrays yet.  Triaged in Back Symptoms Guideline - Disposition:  See Provider Within 24 Hours due to pain radiating into thigh and below knee.  Unable to make appointment either today or tomorrow with Dr Lovell Sheehan or Jolaine Artist PA.  PLEASE REVIEW AND ADVISE WITH XRAY RESULTS TO [name] AT  [phone].

## 2011-10-08 NOTE — Telephone Encounter (Signed)
Bonnye, please let me know after you see xrays, if you would like an appt for this fellow. Thanks!

## 2011-10-08 NOTE — Telephone Encounter (Signed)
Per dr Lovell Sheehan- have mri and go ahead and set up neurosurgeon referral-continue to take medication give to pt

## 2011-10-09 ENCOUNTER — Other Ambulatory Visit: Payer: Self-pay | Admitting: *Deleted

## 2011-10-10 ENCOUNTER — Ambulatory Visit
Admission: RE | Admit: 2011-10-10 | Discharge: 2011-10-10 | Disposition: A | Discharge status: Home / Self Care | Payer: Managed Care, Other (non HMO) | Source: Ambulatory Visit | Attending: Internal Medicine | Admitting: Internal Medicine

## 2011-10-10 DIAGNOSIS — M545 Low back pain, unspecified: Secondary | ICD-10-CM

## 2011-10-11 ENCOUNTER — Telehealth: Payer: Self-pay | Admitting: Internal Medicine

## 2011-10-11 NOTE — Telephone Encounter (Signed)
Mri results are back if you need to send those to neurosurgeon

## 2011-10-11 NOTE — Telephone Encounter (Signed)
Caller: Janis/Spouse; Patient Name: Nathan Stewart; PCP: Darryll Capers; Best Callback Phone Number: [phone].  Called for spinal MRI results from 10/10/11 evening.   Also never received spine xray results from 10/08/11.  Finished Presnisone 10/11/11;  using pain medication.  Reports no clinical change; continues to have back pain and L leg numbness.  Abnormal results of both tests seen in Epic. Information noted and sent to [facility] for follow up for caller requesting radiology results per PCP Call, No Triage Guideline.

## 2011-10-14 ENCOUNTER — Other Ambulatory Visit: Payer: Self-pay | Admitting: Internal Medicine

## 2011-10-15 ENCOUNTER — Telehealth: Payer: Self-pay | Admitting: Internal Medicine

## 2011-10-15 NOTE — Telephone Encounter (Signed)
Caller: [name]/Spouse; Patient Name: Nathan Stewart; PCP: Darryll Capers; Best Callback Phone Number: [phone]. Pt seen in office for back pain 10/04/11.  Pt went to [facility] on 10/14/11:  pt was told that he didnt need surgery right away.  Pt states he doesnt like the numb feeling in his leg and pt is looking for another referral to a back specialists or chiropracter to help with numbness in left leg.  Please call pt back with referral suggestions.

## 2011-10-15 NOTE — Telephone Encounter (Signed)
Talked with pt and he told dr Nathan Stewart that  His back pain was better in a week- he is concerned about his toe being numb, so I told h im to continue with what he is doing and if his toe numbness is not better in a week , call dr Nathan Stewart and let him know

## 2011-10-22 ENCOUNTER — Telehealth: Payer: Self-pay | Admitting: Internal Medicine

## 2011-10-22 NOTE — Telephone Encounter (Signed)
Caller: Janis/Spouse; Patient Name: Nathan Stewart; PCP: Darryll Capers (Adults only); Best Callback Phone Number: [phone] Seen in office on 10/03/11 and prescribed Tramadol for lower back pain and it is making him feel very drowsy-not able to work when he takes it. MRI done ~ 10/11/11 showed bulging disc. Requesting another non-drowsy pain med. Cannot take Percocet d/t upsets stomach. Triage and Care advice per Back Symptoms Protocol and advised Ibuprofen 800 mgs PO TID or Alleve 1 PO q 12 hours. Please let him know if there is anotherPrescription Medication that may help with pain like Ketorolac that he can take while at work.  Uses [facility] on [address] [phone]. Advised to call for appnt is sx not improved within 72 hours of implementing care advice.

## 2011-10-22 NOTE — Telephone Encounter (Signed)
Take aleve during the day and tramadol ast night and can also take flexeril  Left message on machine

## 2011-11-19 ENCOUNTER — Encounter: Payer: Self-pay | Admitting: Internal Medicine

## 2011-11-25 ENCOUNTER — Ambulatory Visit: Payer: Managed Care, Other (non HMO) | Admitting: Internal Medicine

## 2012-01-13 ENCOUNTER — Ambulatory Visit: Payer: Managed Care, Other (non HMO) | Admitting: Internal Medicine

## 2012-01-22 ENCOUNTER — Ambulatory Visit: Payer: Managed Care, Other (non HMO) | Admitting: Internal Medicine

## 2012-02-26 ENCOUNTER — Other Ambulatory Visit: Payer: Self-pay | Admitting: Internal Medicine

## 2012-02-28 ENCOUNTER — Telehealth: Payer: Self-pay | Admitting: Internal Medicine

## 2012-02-28 NOTE — Telephone Encounter (Signed)
Patient is coming for a physical on 04/24/12 and he would like to get injections in his knees, he has had these injections before, call his wife with any questions

## 2012-04-14 ENCOUNTER — Other Ambulatory Visit (INDEPENDENT_AMBULATORY_CARE_PROVIDER_SITE_OTHER): Payer: Managed Care, Other (non HMO)

## 2012-04-14 DIAGNOSIS — Z0000 Encounter for general adult medical examination without abnormal findings: Secondary | ICD-10-CM

## 2012-04-14 LAB — CBC WITH DIFFERENTIAL/PLATELET
Basophils Absolute: 0 10*3/uL (ref 0.0–0.1)
Eosinophils Absolute: 0.1 10*3/uL (ref 0.0–0.7)
Hemoglobin: 14.6 g/dL (ref 13.0–17.0)
Lymphocytes Relative: 26.2 % (ref 12.0–46.0)
Lymphs Abs: 1.8 10*3/uL (ref 0.7–4.0)
MCHC: 33.5 g/dL (ref 30.0–36.0)
Monocytes Relative: 9 % (ref 3.0–12.0)
Neutro Abs: 4.2 10*3/uL (ref 1.4–7.7)
Platelets: 199 10*3/uL (ref 150.0–400.0)
RDW: 12.7 % (ref 11.5–14.6)

## 2012-04-14 LAB — POCT URINALYSIS DIPSTICK
Bilirubin, UA: NEGATIVE
Ketones, UA: NEGATIVE
Protein, UA: NEGATIVE
Spec Grav, UA: 1.015
pH, UA: 5.5

## 2012-04-14 LAB — LIPID PANEL
HDL: 37 mg/dL — ABNORMAL LOW (ref >39.00)
LDL Cholesterol: 103 mg/dL — ABNORMAL HIGH (ref 0–99)
VLDL: 24.8 mg/dL (ref 0.0–40.0)

## 2012-04-14 LAB — BASIC METABOLIC PANEL
BUN: 20 mg/dL (ref 6–23)
CO2: 28 mEq/L (ref 19–32)
Calcium: 9.9 mg/dL (ref 8.4–10.5)
GFR: 94.69 mL/min (ref >60.00)
Glucose, Bld: 106 mg/dL — ABNORMAL HIGH (ref 70–99)

## 2012-04-14 LAB — TSH: TSH: 1.39 u[IU]/mL (ref 0.35–5.50)

## 2012-04-14 LAB — HEPATIC FUNCTION PANEL
AST: 27 U/L (ref 0–37)
Albumin: 4.5 g/dL (ref 3.5–5.2)
Alkaline Phosphatase: 46 U/L (ref 39–117)
Total Bilirubin: 1.1 mg/dL (ref 0.3–1.2)

## 2012-04-21 ENCOUNTER — Ambulatory Visit (INDEPENDENT_AMBULATORY_CARE_PROVIDER_SITE_OTHER): Payer: Managed Care, Other (non HMO) | Admitting: Internal Medicine

## 2012-04-21 ENCOUNTER — Encounter: Payer: Self-pay | Admitting: Internal Medicine

## 2012-04-21 VITALS — BP 140/76 | HR 92 | Temp 98.2°F | Resp 16 | Ht 74.0 in | Wt 199.0 lb

## 2012-04-21 DIAGNOSIS — I10 Essential (primary) hypertension: Secondary | ICD-10-CM

## 2012-04-21 DIAGNOSIS — Z0000 Encounter for general adult medical examination without abnormal findings: Secondary | ICD-10-CM

## 2012-04-21 MED ORDER — TESTOSTERONE CYPIONATE 200 MG/ML IM SOLN: 200.0000 mg | INTRAMUSCULAR | Status: DC

## 2012-04-21 NOTE — Progress Notes (Signed)
  Subjective:    Patient ID: Nathan Stewart, male    DOB: 05-Mar-1955, 57 y.o.   MRN: [id number]  HPI  CPX wellness exam HTN and GERD chronic problems and hypogonadism  Review of Systems  Constitutional: Negative for fever and fatigue.  HENT: Negative for hearing loss, congestion, neck pain and postnasal drip.   Eyes: Negative for discharge, redness and visual disturbance.  Respiratory: Negative for cough, shortness of breath and wheezing.   Cardiovascular: Negative for leg swelling.  Gastrointestinal: Negative for abdominal pain, constipation and abdominal distention.  Genitourinary: Negative for urgency and frequency.  Musculoskeletal: Negative for joint swelling and arthralgias.  Skin: Negative for color change and rash.  Neurological: Negative for weakness and light-headedness.  Hematological: Negative for adenopathy.  Psychiatric/Behavioral: Negative for behavioral problems.       Objective:   Physical Exam  Nursing note and vitals reviewed. Constitutional: He is oriented to person, place, and time. He appears well-developed and well-nourished.  HENT:  Head: Normocephalic and atraumatic.  Eyes: Conjunctivae are normal. Pupils are equal, round, and reactive to light.  Neck: Normal range of motion. Neck supple.  Cardiovascular: Normal rate and regular rhythm.   Pulmonary/Chest: Effort normal and breath sounds normal.  Abdominal: Soft. Bowel sounds are normal.  Genitourinary: Rectum normal and prostate normal.  bph  Neurological: He is alert and oriented to person, place, and time.  Skin: Skin is warm and dry.          Assessment & Plan:   Patient presents for yearly preventative medicine examination.   all immunizations and health maintenance protocols were reviewed with the patient and they are up to date with these protocols.   screening laboratory values were reviewed with the patient including screening of hyperlipidemia PSA renal function and hepatic  function.   There medications past medical history social history problem list and allergies were reviewed in detail.   Goals were established with regard to weight loss exercise diet in compliance with medications

## 2012-04-21 NOTE — Patient Instructions (Addendum)
The patient is instructed to continue all medications as prescribed. Schedule followup with check out clerk upon leaving the clinic  

## 2012-04-24 ENCOUNTER — Encounter: Payer: Managed Care, Other (non HMO) | Admitting: Internal Medicine

## 2012-07-18 ENCOUNTER — Other Ambulatory Visit: Payer: Self-pay | Admitting: Internal Medicine

## 2012-08-24 ENCOUNTER — Encounter: Payer: Self-pay | Admitting: Internal Medicine

## 2012-08-24 ENCOUNTER — Ambulatory Visit (INDEPENDENT_AMBULATORY_CARE_PROVIDER_SITE_OTHER): Payer: Managed Care, Other (non HMO) | Admitting: Internal Medicine

## 2012-08-24 VITALS — BP 130/78 | HR 80 | Temp 98.2°F | Resp 16 | Ht <= 58 in | Wt 190.0 lb

## 2012-08-24 DIAGNOSIS — I10 Essential (primary) hypertension: Secondary | ICD-10-CM

## 2012-08-24 DIAGNOSIS — M5380 Other specified dorsopathies, site unspecified: Secondary | ICD-10-CM

## 2012-08-24 DIAGNOSIS — M62830 Muscle spasm of back: Secondary | ICD-10-CM

## 2012-08-24 MED ORDER — CYCLOBENZAPRINE HCL 10 MG PO TABS: 10.0000 mg | ORAL_TABLET | Freq: Two times a day (BID) | ORAL | Status: DC | PRN

## 2012-08-24 NOTE — Patient Instructions (Addendum)
The patient is instructed to continue all medications as prescribed. Schedule followup with check out clerk upon leaving the clinic Back Exercises Back exercises help treat and prevent back injuries. The goal of back exercises is to increase the strength of your abdominal and back muscles and the flexibility of your back. These exercises should be started when you no longer have back pain. Back exercises include:  Pelvic Tilt. Lie on your back with your knees bent. Tilt your pelvis until the lower part of your back is against the floor. Hold this position 5 to 10 sec and repeat 5 to 10 times.  Knee to Chest. Pull first 1 knee up against your chest and hold for 20 to 30 seconds, repeat this with the other knee, and then both knees. This may be done with the other leg straight or bent, whichever feels better.  Sit-Ups or Curl-Ups. Bend your knees 90 degrees. Start with tilting your pelvis, and do a partial, slow sit-up, lifting your trunk only 30 to 45 degrees off the floor. Take at least 2 to 3 seconds for each sit-up. Do not do sit-ups with your knees out straight. If partial sit-ups are difficult, simply do the above but with only tightening your abdominal muscles and holding it as directed.  Hip-Lift. Lie on your back with your knees flexed 90 degrees. Push down with your feet and shoulders as you raise your hips a couple inches off the floor; hold for 10 seconds, repeat 5 to 10 times.  Back arches. Lie on your stomach, propping yourself up on bent elbows. Slowly press on your hands, causing an arch in your low back. Repeat 3 to 5 times. Any initial stiffness and discomfort should lessen with repetition over time.  Shoulder-Lifts. Lie face down with arms beside your body. Keep hips and torso pressed to floor as you slowly lift your head and shoulders off the floor. Do not overdo your exercises, especially in the beginning. Exercises may cause you some mild back discomfort which lasts for a few  minutes; however, if the pain is more severe, or lasts for more than 15 minutes, do not continue exercises until you see your caregiver. Improvement with exercise therapy for back problems is slow.  See your caregivers for assistance with developing a proper back exercise program. Document Released: 03/21/2004 Document Revised: 05/06/2011 Document Reviewed: 12/13/2010 Exit[employer] Patient Information 2014 [employer].  

## 2012-08-24 NOTE — Progress Notes (Signed)
Subjective:    Patient ID: Nathan Stewart, male    DOB: 12-09-1955, 57 y.o.   MRN: [id number]  HPI Blood pressure and monitoring liver with hx of hepatitis Back pain and spasm  Nocturnal cramping   Review of Systems  Constitutional: Negative for fever and fatigue.  HENT: Negative for hearing loss, congestion, neck pain and postnasal drip.   Eyes: Negative for discharge, redness and visual disturbance.  Respiratory: Negative for cough, shortness of breath and wheezing.   Cardiovascular: Negative for leg swelling.  Gastrointestinal: Negative for abdominal pain, constipation and abdominal distention.  Genitourinary: Negative for urgency and frequency.  Musculoskeletal: Negative for joint swelling and arthralgias.  Skin: Negative for color change and rash.  Neurological: Negative for weakness and light-headedness.  Hematological: Negative for adenopathy.  Psychiatric/Behavioral: Negative for behavioral problems.   Past Medical History  Diagnosis Date  . TESTICULAR HYPOFUNCTION 08/14/2009  . INSOMNIA, CHRONIC 11/13/2009  . GERD 01/01/2007  . 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 GEN OSTEOARTHROSIS INVOLVING MULTIPLE SITES 11/10/2007  . LOC OSTEOARTHROS NOT SPEC PRIM/SEC LOWER LEG 11/10/2007  . OSTEOARTHRITIS 06/07/2008  . SLEEP APNEA 09/20/2009  . Memory loss 08/27/2007  . LIVER FUNCTION TESTS, ABNORMAL 08/27/2007  . MENISCUS TEAR 10/30/2006    History   Social History  . Marital Status: Married    Spouse Name: N/A    Number of Children: N/A  . Years of Education: N/A   Occupational History  . Not on file.   Social History Main Topics  . Smoking status: Never Smoker   . Smokeless tobacco: Not on file  . Alcohol Use: Not on file  . Drug Use: Not on file  . Sexually Active: Not on file   Other Topics Concern  . Not on file   Social History Narrative  . No narrative on file    Past Surgical History  Procedure Laterality Date  . Hand surgery      No family history  on file.  No Known Allergies  Current Outpatient Prescriptions on File Prior to Visit  Medication Sig Dispense Refill  . cyclobenzaprine (FLEXERIL) 10 MG tablet TAKE 1 TABLET 3 TIMES A DAY AS NEEDED MUSCLE SPASMS  30 tablet  0  . estazolam (PROSOM) 2 MG tablet TAKE 1 TABLET AT BEDTIME  30 tablet  3  . ibuprofen (ADVIL,MOTRIN) 800 MG tablet Take 800 mg by mouth 2 (two) times daily as needed.        Marland Kitchen olmesartan-hydrochlorothiazide (BENICAR HCT) 40-12.5 MG per tablet Take 1 tablet by mouth daily.      Marland Kitchen testosterone cypionate (DEPOTESTOTERONE CYPIONATE) 200 MG/ML injection Inject 1 mL (200 mg total) into the muscle every 28 (twenty-eight) days. 1/15ml every 2 weeks  10 mL  0  . traMADol (ULTRAM) 50 MG tablet TAKE 1 TABLET BY MOUTH EVERY 8 HOURS AS NEEDED FOR PAIN  30 tablet  0   No current facility-administered medications on file prior to visit.    BP 130/78  Pulse 80  Temp(Src) 98.2 F (36.8 C)  Resp 16  Ht 4' (1.219 m)  Wt 190 lb (86.183 kg)  BMI 58 kg/m2        Objective:   Physical Exam  Constitutional: He appears well-developed and well-nourished.  HENT:  Head: Normocephalic and atraumatic.  Eyes: Conjunctivae are normal. Pupils are equal, round, and reactive to light.  Neck: Normal range of motion. Neck supple.  Cardiovascular: Normal rate and regular rhythm.   Pulmonary/Chest: Effort normal and  breath sounds normal.  Abdominal: Soft. Bowel sounds are normal.          Assessment & Plan:  Stable hTN Chronic back spasm

## 2012-12-12 ENCOUNTER — Other Ambulatory Visit: Payer: Self-pay | Admitting: Internal Medicine

## 2013-01-25 ENCOUNTER — Encounter: Payer: Self-pay | Admitting: Internal Medicine

## 2013-01-25 ENCOUNTER — Ambulatory Visit (INDEPENDENT_AMBULATORY_CARE_PROVIDER_SITE_OTHER): Payer: Managed Care, Other (non HMO) | Admitting: Internal Medicine

## 2013-01-25 VITALS — BP 110/70 | HR 100 | Temp 98.5°F | Wt 199.0 lb

## 2013-01-25 DIAGNOSIS — Z23 Encounter for immunization: Secondary | ICD-10-CM

## 2013-01-25 DIAGNOSIS — I10 Essential (primary) hypertension: Secondary | ICD-10-CM

## 2013-01-25 NOTE — Progress Notes (Signed)
Subjective:    Patient ID: Nathan Stewart, male    DOB: November 04, 1955, 57 y.o.   MRN: [id number]  Hypertension Pertinent negatives include no neck pain or shortness of breath.  Back Pain Pertinent negatives include no abdominal pain, fever or weakness.      Review of Systems  Constitutional: Negative for fever and fatigue.  HENT: Negative for congestion, hearing loss and postnasal drip.   Eyes: Negative for discharge, redness and visual disturbance.  Respiratory: Negative for cough, shortness of breath and wheezing.   Cardiovascular: Negative for leg swelling.  Gastrointestinal: Negative for abdominal pain, constipation and abdominal distention.  Genitourinary: Negative for urgency and frequency.  Musculoskeletal: Positive for back pain and joint swelling. Negative for arthralgias and neck pain.  Skin: Negative for color change and rash.  Neurological: Negative for weakness and light-headedness.  Hematological: Negative for adenopathy.  Psychiatric/Behavioral: Negative for behavioral problems.   Past Medical History  Diagnosis Date  . TESTICULAR HYPOFUNCTION 08/14/2009  . INSOMNIA, CHRONIC 11/13/2009  . GERD 01/01/2007  . 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 GEN OSTEOARTHROSIS INVOLVING MULTIPLE SITES 11/10/2007  . LOC OSTEOARTHROS NOT SPEC PRIM/SEC LOWER LEG 11/10/2007  . OSTEOARTHRITIS 06/07/2008  . SLEEP APNEA 09/20/2009  . Memory loss 08/27/2007  . LIVER FUNCTION TESTS, ABNORMAL 08/27/2007  . MENISCUS TEAR 10/30/2006    History   Social History  . Marital Status: Married    Spouse Name: N/A    Number of Children: N/A  . Years of Education: N/A   Occupational History  . Not on file.   Social History Main Topics  . Smoking status: Never Smoker   . Smokeless tobacco: Not on file  . Alcohol Use: Not on file  . Drug Use: Not on file  . Sexual Activity: Not on file   Other Topics Concern  . Not on file   Social History Narrative  . No narrative on file    Past  Surgical History  Procedure Laterality Date  . Hand surgery      No family history on file.  No Known Allergies  Current Outpatient Prescriptions on File Prior to Visit  Medication Sig Dispense Refill  . cyclobenzaprine (FLEXERIL) 10 MG tablet Take 1 tablet (10 mg total) by mouth 2 (two) times daily as needed for muscle spasms.  30 tablet  2  . estazolam (PROSOM) 2 MG tablet TAKE 1 TABLET BY MOUTH DAILY AT BEDTIME  30 tablet  3  . ibuprofen (ADVIL,MOTRIN) 800 MG tablet Take 800 mg by mouth 2 (two) times daily as needed.        Marland Kitchen olmesartan-hydrochlorothiazide (BENICAR HCT) 40-12.5 MG per tablet Take 1 tablet by mouth daily.      Marland Kitchen testosterone cypionate (DEPOTESTOTERONE CYPIONATE) 200 MG/ML injection Inject 1 mL (200 mg total) into the muscle every 28 (twenty-eight) days. 1/13ml every 2 weeks  10 mL  0  . traMADol (ULTRAM) 50 MG tablet TAKE 1 TABLET BY MOUTH EVERY 8 HOURS AS NEEDED FOR PAIN  30 tablet  0   No current facility-administered medications on file prior to visit.    BP 110/70  Pulse 100  Temp(Src) 98.5 F (36.9 C) (Oral)  Wt 199 lb (90.266 kg)       Objective:   Physical Exam  Nursing note and vitals reviewed. Constitutional: He appears well-developed and well-nourished.  HENT:  Head: Normocephalic and atraumatic.  Eyes: Conjunctivae are normal. Pupils are equal, round, and reactive to light.  Neck: Normal range  of motion. Neck supple.  Cardiovascular: Normal rate and regular rhythm.   Pulmonary/Chest: Effort normal and breath sounds normal.  Abdominal: Soft. Bowel sounds are normal.  Musculoskeletal: He exhibits edema and tenderness.          Assessment & Plan:  Numbness in the left leg due to radicular pain Had a series of shorts in knees ( the synvisc) HTN stable On benicar

## 2013-01-25 NOTE — Progress Notes (Signed)
Pre visit review using our clinic review tool, if applicable. No additional management support is needed unless otherwise documented below in the visit note. 

## 2013-01-25 NOTE — Patient Instructions (Signed)
The patient is instructed to continue all medications as prescribed. Schedule followup with check out clerk upon leaving the clinic  

## 2013-04-28 ENCOUNTER — Other Ambulatory Visit: Payer: Self-pay | Admitting: Internal Medicine

## 2013-06-07 ENCOUNTER — Other Ambulatory Visit (INDEPENDENT_AMBULATORY_CARE_PROVIDER_SITE_OTHER): Payer: Managed Care, Other (non HMO)

## 2013-06-07 DIAGNOSIS — Z0000 Encounter for general adult medical examination without abnormal findings: Secondary | ICD-10-CM

## 2013-06-07 LAB — LIPID PANEL
CHOLESTEROL: 197 mg/dL (ref 0–200)
HDL: 44.2 mg/dL (ref >39.00)
LDL Cholesterol: 134 mg/dL — ABNORMAL HIGH (ref 0–99)
Total CHOL/HDL Ratio: 4
Triglycerides: 96 mg/dL (ref 0.0–149.0)
VLDL: 19.2 mg/dL (ref 0.0–40.0)

## 2013-06-07 LAB — BASIC METABOLIC PANEL
BUN: 15 mg/dL (ref 6–23)
CALCIUM: 10.1 mg/dL (ref 8.4–10.5)
CO2: 29 mEq/L (ref 19–32)
Chloride: 101 mEq/L (ref 96–112)
Creatinine, Ser: 1 mg/dL (ref 0.4–1.5)
GFR: 101 mL/min (ref >60.00)
GLUCOSE: 102 mg/dL — AB (ref 70–99)
POTASSIUM: 4.4 meq/L (ref 3.5–5.1)
Sodium: 139 mEq/L (ref 135–145)

## 2013-06-07 LAB — POCT URINALYSIS DIPSTICK
Bilirubin, UA: NEGATIVE
Glucose, UA: NEGATIVE
Ketones, UA: NEGATIVE
Leukocytes, UA: NEGATIVE
Nitrite, UA: NEGATIVE
PROTEIN UA: NEGATIVE
Urobilinogen, UA: 0.2
pH, UA: 5

## 2013-06-07 LAB — CBC WITH DIFFERENTIAL/PLATELET
Basophils Absolute: 0 10*3/uL (ref 0.0–0.1)
Basophils Relative: 0.5 % (ref 0.0–3.0)
EOS PCT: 3.4 % (ref 0.0–5.0)
Eosinophils Absolute: 0.2 10*3/uL (ref 0.0–0.7)
HEMATOCRIT: 43.6 % (ref 39.0–52.0)
HEMOGLOBIN: 14.8 g/dL (ref 13.0–17.0)
LYMPHS PCT: 29.1 % (ref 12.0–46.0)
Lymphs Abs: 1.6 10*3/uL (ref 0.7–4.0)
MCHC: 33.9 g/dL (ref 30.0–36.0)
MCV: 92.9 fl (ref 78.0–100.0)
MONOS PCT: 9.7 % (ref 3.0–12.0)
Monocytes Absolute: 0.5 10*3/uL (ref 0.1–1.0)
NEUTROS ABS: 3.2 10*3/uL (ref 1.4–7.7)
Neutrophils Relative %: 57.3 % (ref 43.0–77.0)
Platelets: 181 10*3/uL (ref 150.0–400.0)
RBC: 4.69 Mil/uL (ref 4.22–5.81)
RDW: 13.5 % (ref 11.5–14.6)
WBC: 5.5 10*3/uL (ref 4.5–10.5)

## 2013-06-07 LAB — HEPATIC FUNCTION PANEL
ALBUMIN: 4.7 g/dL (ref 3.5–5.2)
ALK PHOS: 51 U/L (ref 39–117)
ALT: 44 U/L (ref 0–53)
AST: 31 U/L (ref 0–37)
Bilirubin, Direct: 0.2 mg/dL (ref 0.0–0.3)
Total Bilirubin: 1 mg/dL (ref 0.3–1.2)
Total Protein: 7.5 g/dL (ref 6.0–8.3)

## 2013-06-07 LAB — PSA: PSA: 0.91 ng/mL (ref 0.10–4.00)

## 2013-06-07 LAB — TSH: TSH: 1.45 u[IU]/mL (ref 0.35–5.50)

## 2013-06-14 ENCOUNTER — Ambulatory Visit (INDEPENDENT_AMBULATORY_CARE_PROVIDER_SITE_OTHER): Payer: Managed Care, Other (non HMO) | Admitting: Internal Medicine

## 2013-06-14 ENCOUNTER — Encounter: Payer: Self-pay | Admitting: Internal Medicine

## 2013-06-14 VITALS — BP 108/68 | HR 92 | Temp 98.6°F | Ht 74.0 in | Wt 194.0 lb

## 2013-06-14 DIAGNOSIS — I10 Essential (primary) hypertension: Secondary | ICD-10-CM

## 2013-06-14 DIAGNOSIS — Z0000 Encounter for general adult medical examination without abnormal findings: Secondary | ICD-10-CM

## 2013-06-14 MED ORDER — IRBESARTAN-HYDROCHLOROTHIAZIDE 150-12.5 MG PO TABS: 1.0000 | ORAL_TABLET | Freq: Every day | ORAL | Status: DC

## 2013-06-14 NOTE — Progress Notes (Signed)
Subjective:    Patient ID: Nathan Stewart, male    DOB: [birth year], 58 y.o.   MRN: [id number]  HPI  Follow for HTN and insomnia On testosterone with stable PSA Back pain improved  CPX  Review of Systems  Constitutional: Negative for fever and fatigue.  HENT: Negative for congestion, hearing loss and postnasal drip.   Eyes: Negative for discharge, redness and visual disturbance.  Respiratory: Negative for cough, shortness of breath and wheezing.   Cardiovascular: Negative for leg swelling.  Gastrointestinal: Negative for abdominal pain, constipation and abdominal distention.  Genitourinary: Negative for urgency and frequency.  Musculoskeletal: Negative for arthralgias, joint swelling and neck pain.  Skin: Negative for color change and rash.  Neurological: Negative for weakness and light-headedness.  Hematological: Negative for adenopathy.  Psychiatric/Behavioral: Positive for sleep disturbance. Negative for behavioral problems.   Past Medical History  Diagnosis Date  . TESTICULAR HYPOFUNCTION 08/14/2009  . INSOMNIA, CHRONIC 11/13/2009  . GERD 01/01/2007  . 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 GEN OSTEOARTHROSIS INVOLVING MULTIPLE SITES 11/10/2007  . LOC OSTEOARTHROS NOT SPEC PRIM/SEC LOWER LEG 11/10/2007  . OSTEOARTHRITIS 06/07/2008  . SLEEP APNEA 09/20/2009  . Memory loss 08/27/2007  . LIVER FUNCTION TESTS, ABNORMAL 08/27/2007  . MENISCUS TEAR 10/30/2006    History   Social History  . Marital Status: Married    Spouse Name: N/A    Number of Children: N/A  . Years of Education: N/A   Occupational History  . Not on file.   Social History Main Topics  . Smoking status: Former Research scientist (life sciences)  . Smokeless tobacco: Never Used     Comment: quit 1999  . Alcohol Use: Yes     Comment: beer  . Drug Use: No  . Sexual Activity: Not on file   Other Topics Concern  . Not on file   Social History Narrative  . No narrative on file    Past Surgical History  Procedure Laterality  Date  . Hand surgery      No family history on file.  Allergies  Allergen Reactions  . Percocet [Oxycodone-Acetaminophen] Nausea And Vomiting    Current Outpatient Prescriptions on File Prior to Visit  Medication Sig Dispense Refill  . cyclobenzaprine (FLEXERIL) 10 MG tablet Take 1 tablet (10 mg total) by mouth 2 (two) times daily as needed for muscle spasms.  30 tablet  2  . estazolam (PROSOM) 2 MG tablet TAKE 1 TABLET BY MOUTH DAILY AT BEDTIME  30 tablet  3  . ibuprofen (ADVIL,MOTRIN) 800 MG tablet Take 800 mg by mouth 2 (two) times daily as needed.        Marland Kitchen olmesartan-hydrochlorothiazide (BENICAR HCT) 40-12.5 MG per tablet Take 1 tablet by mouth daily.      Marland Kitchen testosterone cypionate (DEPOTESTOTERONE CYPIONATE) 200 MG/ML injection Inject 1 mL (200 mg total) into the muscle every 28 (twenty-eight) days. 1/70ml every 2 weeks  10 mL  0  . traMADol (ULTRAM) 50 MG tablet TAKE 1 TABLET BY MOUTH EVERY 8 HOURS AS NEEDED FOR PAIN  30 tablet  0   No current facility-administered medications on file prior to visit.    BP 108/68  Pulse 92  Temp(Src) 98.6 F (37 C) (Oral)  Ht 6\' 2"  (1.88 m)  Wt 194 lb (87.998 kg)  BMI 24.90 kg/m2  SpO2 96%       Objective:   Physical Exam  Constitutional: He appears well-developed and well-nourished.  HENT:  Head: Normocephalic and atraumatic.  Eyes: Conjunctivae  are normal. Pupils are equal, round, and reactive to light.  Neck: Normal range of motion. Neck supple.  Cardiovascular: Normal rate and regular rhythm.   Pulmonary/Chest: Effort normal and breath sounds normal.  Abdominal: Soft. Bowel sounds are normal.  Genitourinary: Rectum normal and prostate normal.  Musculoskeletal: He exhibits tenderness.  Skin: Skin is warm and dry.          Assessment & Plan:  Patient presents for yearly preventative medicine examination. Medicare questionnaire was completed  All immunizations and health maintenance protocols were reviewed with the  patient and needed orders were placed.  Appropriate screening laboratory values were ordered for the patient including screening of hyperlipidemia, renal function and hepatic function. If indicated by BPH, a PSA was ordered.  Medication reconciliation,  past medical history, social history, problem list and allergies were reviewed in detail with the patient  Goals were established with regard to weight loss, exercise, and  diet in compliance with medications  End of life planning was discussed.  Severe DJD of knees but does not want ortho referral as of yet(  Not ready for TKR) Stable HTN  But needs to change medication for HTN Stable testosterone replacement

## 2013-06-14 NOTE — Progress Notes (Signed)
Pre visit review using our clinic review tool, if applicable. No additional management support is needed unless otherwise documented below in the visit note. 

## 2013-06-14 NOTE — Patient Instructions (Signed)
The patient is instructed to continue all medications as prescribed. Schedule followup with check out clerk upon leaving the clinic  

## 2013-06-15 ENCOUNTER — Telehealth: Payer: Self-pay | Admitting: Internal Medicine

## 2013-06-15 NOTE — Telephone Encounter (Signed)
Relevant patient education mailed to patient.  

## 2013-07-09 ENCOUNTER — Telehealth: Payer: Self-pay | Admitting: Internal Medicine

## 2013-07-09 MED ORDER — TESTOSTERONE CYPIONATE 200 MG/ML IM SOLN: 200.0000 mg | INTRAMUSCULAR | Status: DC

## 2013-07-09 NOTE — Telephone Encounter (Signed)
Pt req rx testosterone cypionate (DEPOTESTOTERONE CYPIONATE) 200 MG/ML injection

## 2013-09-15 ENCOUNTER — Other Ambulatory Visit: Payer: Self-pay | Admitting: Internal Medicine

## 2013-09-28 ENCOUNTER — Encounter: Payer: Self-pay | Admitting: *Deleted

## 2013-11-10 ENCOUNTER — Ambulatory Visit (INDEPENDENT_AMBULATORY_CARE_PROVIDER_SITE_OTHER): Payer: Managed Care, Other (non HMO) | Admitting: Family Medicine

## 2013-11-10 ENCOUNTER — Encounter: Payer: Self-pay | Admitting: Family Medicine

## 2013-11-10 VITALS — BP 148/82 | HR 78 | Temp 98.5°F | Resp 14 | Ht 72.5 in | Wt 194.0 lb

## 2013-11-10 DIAGNOSIS — E291 Testicular hypofunction: Secondary | ICD-10-CM

## 2013-11-10 DIAGNOSIS — M1990 Unspecified osteoarthritis, unspecified site: Secondary | ICD-10-CM

## 2013-11-10 DIAGNOSIS — G4700 Insomnia, unspecified: Secondary | ICD-10-CM

## 2013-11-10 DIAGNOSIS — I10 Essential (primary) hypertension: Secondary | ICD-10-CM

## 2013-11-10 DIAGNOSIS — H6120 Impacted cerumen, unspecified ear: Secondary | ICD-10-CM

## 2013-11-10 DIAGNOSIS — H6121 Impacted cerumen, right ear: Secondary | ICD-10-CM

## 2013-11-10 MED ORDER — TRAMADOL HCL 50 MG PO TABS
50.0000 mg | ORAL_TABLET | Freq: Three times a day (TID) | ORAL | Status: DC | PRN
Start: 2013-11-10 — End: 2014-11-16

## 2013-11-10 MED ORDER — ZOLPIDEM TARTRATE 10 MG PO TABS: 10.0000 mg | ORAL_TABLET | Freq: Every evening | ORAL | Status: DC | PRN

## 2013-11-10 NOTE — Assessment & Plan Note (Signed)
Noted in chart He uses ultram and Flexeril as needed

## 2013-11-10 NOTE — Assessment & Plan Note (Signed)
No recent testosterone level note he did have a normal PSA back in April

## 2013-11-10 NOTE — Assessment & Plan Note (Signed)
BP minimally elevated today his previous blood pressure readings have been well-controlled I will continue current medication

## 2013-11-10 NOTE — Progress Notes (Signed)
Patient ID: Nathan Stewart, male   DOB: May 20, 1955, 58 y.o.   MRN: [id number]   Subjective:    Patient ID: Nathan Stewart, male    DOB: [birth year], 58 y.o.   MRN: [id number]  Patient presents for New Patient CPE and Ear Irrigation  patient here to establish care. Previous PCP was Dr. Benay Pillow. He does not see any specialist. He has history of hypertension mild hyperlipidemia obstructive sleep apnea however unable to tolerate any CPAP machine. He is also being treated for low testosterone and he is on testosterone injections once a month. These are given by his daughter.  Chronic back pain he is a chronic back pain on and off when it does flare up he uses tramadol and Flexeril all as needed.  Chronic insomnia he's had difficulty with insomnia for many years he is currently on ProSom however it is no longer helping therefore he tapered himself off recently he's been using Tylenol PM which helps some but he is workup taken his medication with his heart medications. He's also tried melatonin with no improvement in his sleep. He walked at wake up a couple times a night and has difficulty falling asleep.  Hypertension was diagnosed with high blood pressure 2003 he was recently switched from Benicar HCTZ to the current Avalide because of insurance purposes.  He also complains of wax impaction would like his ears irrigated    Review Of Systems:  GEN- denies fatigue, fever, weight loss,weakness, recent illness HEENT- denies eye drainage, change in vision, nasal discharge, CVS- denies chest pain, palpitations RESP- denies SOB, cough, wheeze ABD- denies N/V, change in stools, abd pain GU- denies dysuria, hematuria, dribbling, incontinence MSK- + joint pain, muscle aches, injury Neuro- denies headache, dizziness, syncope, seizure activity       Objective:    BP 148/82  Pulse 78  Temp(Src) 98.5 F (36.9 C) (Oral)  Resp 14  Ht 6' 0.5" (1.842 m)  Wt 194 lb (87.998 kg)  BMI 25.94  kg/m2 GEN- NAD, alert and oriented x3 HEENT- PERRL, EOMI, non injected sclera, pink conjunctiva, MMM, oropharynx clear, left canal impacated with wax, right canal mild wax, TM clear bilat Neck- Supple, n CVS- RRR, no murmur RESP-CTAB ABD-NABS,soft,NT,ND EXT- No edema Pulses- Radial 2+ Psych- pleasant, normal affect and mood        Assessment & Plan:      Problem List Items Addressed This Visit   TESTICULAR HYPOFUNCTION - Primary   Relevant Orders      Comprehensive metabolic panel      Testosterone   INSOMNIA, CHRONIC   HYPERTENSION   Cerumen impaction      Note: This dictation was prepared with Dragon dictation along with smaller phrase technology. Any transcriptional errors that result from this process are unintentional.

## 2013-11-10 NOTE — Patient Instructions (Signed)
Try the ambien at bedtime as needed for insomnia Continue blood pressure medications We will call with lab results F/U May for PHYSICAL

## 2013-11-10 NOTE — Assessment & Plan Note (Signed)
For chronic insomnia I would try him on Ambien he's tried over-the-counter medications as well as the ProSom benzo diazepam

## 2013-11-10 NOTE — Assessment & Plan Note (Signed)
Status post wax removal with irritation at bedside  New patient with a hold off and get his flu shot at a later date

## 2013-11-11 LAB — COMPREHENSIVE METABOLIC PANEL
ALK PHOS: 52 U/L (ref 39–117)
ALT: 35 U/L (ref 0–53)
AST: 23 U/L (ref 0–37)
Albumin: 4.9 g/dL (ref 3.5–5.2)
BILIRUBIN TOTAL: 0.8 mg/dL (ref 0.2–1.2)
BUN: 26 mg/dL — AB (ref 6–23)
CO2: 28 mEq/L (ref 19–32)
CREATININE: 1.22 mg/dL (ref 0.50–1.35)
Calcium: 10 mg/dL (ref 8.4–10.5)
Chloride: 103 mEq/L (ref 96–112)
GLUCOSE: 80 mg/dL (ref 70–99)
Potassium: 4.3 mEq/L (ref 3.5–5.3)
SODIUM: 138 meq/L (ref 135–145)
Total Protein: 7.2 g/dL (ref 6.0–8.3)

## 2013-11-11 LAB — TESTOSTERONE: Testosterone: 185 ng/dL — ABNORMAL LOW (ref 300–890)

## 2013-12-13 ENCOUNTER — Ambulatory Visit: Payer: Managed Care, Other (non HMO) | Admitting: Family Medicine

## 2013-12-14 ENCOUNTER — Encounter: Payer: Self-pay | Admitting: Family Medicine

## 2013-12-14 ENCOUNTER — Ambulatory Visit (INDEPENDENT_AMBULATORY_CARE_PROVIDER_SITE_OTHER): Payer: Managed Care, Other (non HMO) | Admitting: Family Medicine

## 2013-12-14 VITALS — BP 160/96 | HR 72 | Temp 98.3°F | Resp 16 | Ht 73.0 in | Wt 192.0 lb

## 2013-12-14 DIAGNOSIS — I10 Essential (primary) hypertension: Secondary | ICD-10-CM

## 2013-12-14 DIAGNOSIS — R0789 Other chest pain: Secondary | ICD-10-CM

## 2013-12-14 DIAGNOSIS — T50905A Adverse effect of unspecified drugs, medicaments and biological substances, initial encounter: Secondary | ICD-10-CM

## 2013-12-14 DIAGNOSIS — T887XXA Unspecified adverse effect of drug or medicament, initial encounter: Secondary | ICD-10-CM

## 2013-12-14 LAB — BASIC METABOLIC PANEL
BUN: 22 mg/dL (ref 6–23)
CHLORIDE: 101 meq/L (ref 96–112)
CO2: 25 meq/L (ref 19–32)
CREATININE: 1.17 mg/dL (ref 0.50–1.35)
Calcium: 10.1 mg/dL (ref 8.4–10.5)
Glucose, Bld: 82 mg/dL (ref 70–99)
POTASSIUM: 4.2 meq/L (ref 3.5–5.3)
Sodium: 137 mEq/L (ref 135–145)

## 2013-12-14 LAB — CBC WITH DIFFERENTIAL/PLATELET
Basophils Absolute: 0 10*3/uL (ref 0.0–0.1)
Basophils Relative: 0 % (ref 0–1)
Eosinophils Absolute: 0.1 10*3/uL (ref 0.0–0.7)
Eosinophils Relative: 1 % (ref 0–5)
HEMATOCRIT: 44.5 % (ref 39.0–52.0)
Hemoglobin: 15.8 g/dL (ref 13.0–17.0)
LYMPHS ABS: 1.9 10*3/uL (ref 0.7–4.0)
LYMPHS PCT: 23 % (ref 12–46)
MCH: 31.5 pg (ref 26.0–34.0)
MCHC: 35.5 g/dL (ref 30.0–36.0)
MCV: 88.6 fL (ref 78.0–100.0)
MONO ABS: 0.6 10*3/uL (ref 0.1–1.0)
Monocytes Relative: 7 % (ref 3–12)
Neutro Abs: 5.7 10*3/uL (ref 1.7–7.7)
Neutrophils Relative %: 69 % (ref 43–77)
Platelets: 232 10*3/uL (ref 150–400)
RBC: 5.02 MIL/uL (ref 4.22–5.81)
RDW: 12.6 % (ref 11.5–15.5)
WBC: 8.3 10*3/uL (ref 4.0–10.5)

## 2013-12-14 MED ORDER — METOPROLOL SUCCINATE ER 25 MG PO TB24: 25.0000 mg | ORAL_TABLET | Freq: Every day | ORAL | Status: DC

## 2013-12-14 MED ORDER — HYDROCHLOROTHIAZIDE 25 MG PO TABS: 25.0000 mg | ORAL_TABLET | Freq: Every day | ORAL | Status: DC

## 2013-12-14 NOTE — Progress Notes (Signed)
Patient ID: Nathan Stewart, male   DOB: 05-13-1955, 58 y.o.   MRN: [id number]   Subjective:    Patient ID: Nathan Stewart, male    DOB: 1955/11/07, 58 y.o.   MRN: [id number]  Patient presents for Medication Review  Pt here with a combination of symptoms over the past week, from headache, dizziness, fatigue, tingling and numbness in his mouth, today he felt like his tongue was swelling, nausea with emesis x 1 last week now resolved. He stopped his BP meds and his symptoms improved, but then he restarted. He also had chest discomfort for a few days, felt like there was "gas around my chest" He has had increasaed stress at work, works 10-12 hours 6 days a week and he has chronic insomnia.     Review Of Systems:  GEN- +fatigue, fever, weight loss,weakness, recent illness HEENT- denies eye drainage, change in vision, nasal discharge, CVS- + chest pain, palpitations RESP- denies SOB, cough, wheeze ABD- denies N/V, change in stools, abd pain GU- denies dysuria, hematuria, dribbling, incontinence MSK- denies joint pain, muscle aches, injury Neuro- denies headache,+ dizziness, syncope, seizure activity       Objective:    BP 160/96  Pulse 72  Temp(Src) 98.3 F (36.8 C) (Oral)  Resp 16  Ht 6\' 1"  (1.854 m)  Wt 192 lb (87.091 kg)  BMI 25.34 kg/m2    GEN- NAD, alert and oriented x3   Repeat BP 160/96 HEENT- PERRL, EOMI, non injected sclera, pink conjunctiva, MMM, oropharynx clear Neck- Supple,  CVS- RRR, no murmur RESP-CTAB ABD-NABS,soft,NT,ND Neuro-CNII-XII in tact Psych- not overally anxious or depressed appearing EXT- No edema Pulses- Radial 2+  EKG- NSR, No ST changes      Assessment & Plan:      Problem List Items Addressed This Visit   Medication side effect   Essential hypertension - Primary     His blood pressure is quite elevated today. I'm concerned he is having a side effect from the losartan as he's had trouble with either the ACE inhibitors in the past. With the  tingling around the mouth and he states that his tongue was swelling I will go ahead and discontinue this. I will keep him on hydrochlorothiazide 25 mg and add Toprol 25 mg once a day Check BMET, CBC, EKG reassuring    Relevant Medications      hydrochlorothiazide tablet      metoprolol succinate (TOPROL-XL) 24 hr tablet   Other Relevant Orders      CBC with Differential      Basic metabolic panel   Atypical chest pain     May have been related to some indigesation and the nausea he was having EKG reassuring       Note: This dictation was prepared with Dragon dictation along with smaller phrase technology. Any transcriptional errors that result from this process are unintentional.

## 2013-12-14 NOTE — Assessment & Plan Note (Signed)
May have been related to some indigesation and the nausea he was having EKG reassuring

## 2013-12-14 NOTE — Patient Instructions (Addendum)
Stop the Avalide Start HCTZ 25mg  and metoprolol 25mg  twice a day  Return in 2 weeks for follow -up on blood pressure - Give 4:30 slot

## 2013-12-14 NOTE — Assessment & Plan Note (Addendum)
His blood pressure is quite elevated today. I'm concerned he is having a side effect from the losartan as he's had trouble with either the ACE inhibitors in the past. With the tingling around the mouth and he states that his tongue was swelling I will go ahead and discontinue this. I will keep him on hydrochlorothiazide 25 mg and add Toprol 25 mg once a day  He has had stress at work which could also be contributing to symptoms Check BMET, CBC, EKG reassuring

## 2013-12-28 ENCOUNTER — Ambulatory Visit (INDEPENDENT_AMBULATORY_CARE_PROVIDER_SITE_OTHER): Payer: Managed Care, Other (non HMO) | Admitting: Family Medicine

## 2013-12-28 VITALS — BP 180/108

## 2013-12-28 DIAGNOSIS — I10 Essential (primary) hypertension: Secondary | ICD-10-CM

## 2013-12-28 MED ORDER — METOPROLOL SUCCINATE ER 50 MG PO TB24: 50.0000 mg | ORAL_TABLET | Freq: Every day | ORAL | Status: DC

## 2013-12-28 NOTE — Assessment & Plan Note (Signed)
Blood pressure uncontrolled however did make changes in his medication recently all increase his Toprol to 50 mg he would continue the HCTZ 25 mg he does have a blood pressure cuff at home he will monitor his blood pressure now her follow-up with him via phone in one week

## 2013-12-28 NOTE — Progress Notes (Signed)
Patient ID: Nathan Stewart, male   DOB: 10/06/1955, 57 y.o.   MRN: [id number]   Subjective:    Patient ID: Nathan Stewart, male    DOB: 07/10/1955, 58 y.o.   MRN: [id number]  Patient presents for Patient here for 2 week BP check  Patient was here for two-week follow-up on his blood pressure last visit a change in some hydrochlorothiazide 25 mg and Toprol 25 mg because he was having side effects with her irbesartan. His blood pressures today 172/102 repeated 180/108 he denies any chest pain shortness of breath headache   Review Of Systems:  GEN- denies fatigue, fever, weight loss,weakness, recent illness HEENT- denies eye drainage, change in vision, nasal discharge, CVS- denies chest pain, palpitations RESP- denies SOB, cough, wheeze        Objective:    BP 180/108 mmHg GEN- NAD, alert and oriented x3 CVS- RRR, no murmur RESP-CTAB EXT- No edema Pulses- Radial 2+        Assessment & Plan:      Problem List Items Addressed This Visit    None      Note: This dictation was prepared with Dragon dictation along with smaller phrase technology. Any transcriptional errors that result from this process are unintentional.

## 2013-12-29 ENCOUNTER — Encounter: Payer: Self-pay | Admitting: Family Medicine

## 2014-01-07 ENCOUNTER — Telehealth: Payer: Self-pay | Admitting: Family Medicine

## 2014-01-07 NOTE — Telephone Encounter (Signed)
-----   Message from [place], LPN sent at 58/10/9831  4:22 PM EST ----- Regarding: RE: F/U BP readings Patient returned call.  States that he has been taking his BP at night, and his readings are usually 135-138/ 78-85.  Reports that he is also feeling much better with his lowered BP.   MD to be made aware.   ----- Message -----    From: Alycia Rossetti, MD    Sent: 01/05/2014   1:38 PM      To: [place], LPN Subject: FW: F/U BP readings                            Call and see what BP has been running, we changed meds recently ----- Message -----    From: Alycia Rossetti, MD    Sent: 01/04/2014      To: Alycia Rossetti, MD Subject: F/U BP readings

## 2014-01-24 ENCOUNTER — Telehealth: Payer: Self-pay | Admitting: Family Medicine

## 2014-01-24 MED ORDER — METOPROLOL SUCCINATE ER 100 MG PO TB24: 100.0000 mg | ORAL_TABLET | Freq: Every day | ORAL | Status: DC

## 2014-01-24 NOTE — Telephone Encounter (Signed)
Increase toprol to 100mg  once a day Call with BP results in 2 weeks Send new script if needed

## 2014-01-24 NOTE — Telephone Encounter (Signed)
Received call from patient.   Reports that he continues to monitor BP daily.   States that BP runs in 150/s/90's.  Last readings are as follows: 156/93 155/94 150/93  MD please advise.

## 2014-01-24 NOTE — Telephone Encounter (Signed)
Call placed to patient and patient made aware.   Prescription sent to pharmacy.  

## 2014-01-24 NOTE — Telephone Encounter (Signed)
Patient says he is still having blood pressure issues and would like to speak to you about this  Please call him at [phone]

## 2014-01-24 NOTE — Telephone Encounter (Signed)
Call placed to patient. LMTRC.  

## 2014-02-04 ENCOUNTER — Ambulatory Visit: Payer: Managed Care, Other (non HMO) | Admitting: *Deleted

## 2014-02-04 ENCOUNTER — Telehealth: Payer: Self-pay | Admitting: *Deleted

## 2014-02-04 ENCOUNTER — Encounter: Payer: Self-pay | Admitting: *Deleted

## 2014-02-04 VITALS — BP 140/90

## 2014-02-04 DIAGNOSIS — I10 Essential (primary) hypertension: Secondary | ICD-10-CM

## 2014-02-04 NOTE — Telephone Encounter (Signed)
BP improved but still running high Start norvasc 5mg  once a day, I will if he responds to this, if he does will try to maybe decrease off one of the others F/U in office 4 weeks for BP

## 2014-02-04 NOTE — Telephone Encounter (Signed)
Pt came in this am to have BP checked bp 140/90 at 8:30am, pt has been keeping record of BP readings:  Start 11/30: 159/89 12/01: 141/88 12/02:  141/90 at 4:50am and 146/90 at 6:34 pm 12/03: 154/93 at 5:30am and 152/94 at 6:50pm 12/04: 153/96 t 7:45am and 163/94 at 7:01pm 12/05: 149/93 at 8:00am and 161/92 at 6:40pm 12/06: 147/85 at 9:05am and 166/94 at 7:00pm 12/07: 163/98 at 5:00am and 149/93 at 6:55pm 12/08: 149/93 at 4:50am and 162/94 at 6:15pm 12/09: 151/94 at 4:55am and 154/96 at 6:45pm 12/10: 146/91 at 5:00am and 147/86 at 6:25pm  Pt is on Metoprolo 100mg  and HCTZ 25mg . Wanted you to see what his readings were and if he needed to make any changes.

## 2014-02-04 NOTE — Telephone Encounter (Signed)
lmtrc

## 2014-02-07 NOTE — Telephone Encounter (Signed)
lmtrc

## 2014-02-07 NOTE — Telephone Encounter (Signed)
This encounter was created in error - please disregard.

## 2014-02-08 MED ORDER — AMLODIPINE BESYLATE 5 MG PO TABS: 5.0000 mg | ORAL_TABLET | Freq: Every day | ORAL | Status: DC

## 2014-02-08 NOTE — Telephone Encounter (Signed)
Lm on both cell and home number to return my call

## 2014-02-08 NOTE — Addendum Note (Signed)
Addended by: Daylene Posey T on: 02/08/2014 08:13 AM   Modules accepted: Orders

## 2014-02-08 NOTE — Telephone Encounter (Signed)
norvasc sent to pharmacy

## 2014-02-08 NOTE — Telephone Encounter (Signed)
Pt called back and aware of message and appointment has been made for Jan 12

## 2014-03-08 ENCOUNTER — Ambulatory Visit (INDEPENDENT_AMBULATORY_CARE_PROVIDER_SITE_OTHER): Payer: Managed Care, Other (non HMO) | Admitting: Family Medicine

## 2014-03-08 ENCOUNTER — Encounter: Payer: Self-pay | Admitting: Family Medicine

## 2014-03-08 VITALS — BP 128/74 | HR 72 | Temp 97.6°F | Resp 16 | Ht 73.0 in | Wt 188.0 lb

## 2014-03-08 DIAGNOSIS — E291 Testicular hypofunction: Secondary | ICD-10-CM

## 2014-03-08 DIAGNOSIS — R7989 Other specified abnormal findings of blood chemistry: Secondary | ICD-10-CM

## 2014-03-08 DIAGNOSIS — I10 Essential (primary) hypertension: Secondary | ICD-10-CM

## 2014-03-08 MED ORDER — AMLODIPINE BESYLATE 5 MG PO TABS: 5.0000 mg | ORAL_TABLET | Freq: Every day | ORAL | Status: DC

## 2014-03-08 MED ORDER — METOPROLOL SUCCINATE ER 100 MG PO TB24: 100.0000 mg | ORAL_TABLET | Freq: Every day | ORAL | Status: DC

## 2014-03-08 MED ORDER — HYDROCHLOROTHIAZIDE 25 MG PO TABS: 25.0000 mg | ORAL_TABLET | Freq: Every day | ORAL | Status: DC

## 2014-03-08 NOTE — Progress Notes (Signed)
Patient ID: Nathan Stewart, male   DOB: [birth year], 59 y.o.   MRN: [id number]   Subjective:    Patient ID: Nathan Stewart, male    DOB: May 28, 1955, 59 y.o.   MRN: [id number]  Patient presents for HTN  Patient here follow-up his blood pressure his readings at home since I added amlodipine 5 mg have been very good. He does have some fatigue but he is not sure if this is due to his job or the medications he also has known low testosterone and his levels are consistently low. His blood pressures have ranged from 115 to 140/69-80 his heart rate has been 70s to 80s   Review Of Systems:  GEN- +fatigue, fever, weight loss,weakness, recent illness HEENT- denies eye drainage, change in vision, nasal discharge, CVS- denies chest pain, palpitations RESP- denies SOB, cough, wheeze ABD- denies N/V, change in stools, abd pain GU- denies dysuria, hematuria, dribbling, incontinence MSK- denies joint pain, muscle aches, injury Neuro- denies headache, dizziness, syncope, seizure activity       Objective:    BP 128/74 mmHg  Pulse 72  Temp(Src) 97.6 F (36.4 C) (Oral)  Resp 16  Ht 6\' 1"  (1.854 m)  Wt 188 lb (85.276 kg)  BMI 24.81 kg/m2 GEN- NAD, alert and oriented x3 HEENT- PERRL, EOMI, non injected sclera, pink conjunctiva, MMM, oropharynx clear CVS- RRR, no murmur RESP-CTAB EXT- No edema Pulses- Radial 2+        Assessment & Plan:      Problem List Items Addressed This Visit    None      Note: This dictation was prepared with Dragon dictation along with smaller phrase technology. Any transcriptional errors that result from this process are unintentional.

## 2014-03-08 NOTE — Patient Instructions (Signed)
Continue current medications Probiotics once a day  F/U at the end of April for the PHYSICAL

## 2014-03-08 NOTE — Assessment & Plan Note (Signed)
He has not found a lot of benefit with the use of testosterone. His levels always consistently low even when his levels were elevated when he was on every 2 weeks he did not notice any difference in his symptoms which is why he stated that he would continue injections once a month. We discussed that he can try going off of the injections altogether and see if there is any change with his fatigue for his mood he is going to consider this

## 2014-03-08 NOTE — Assessment & Plan Note (Signed)
Blood pressure is well-controlled and he changed her medications

## 2014-04-07 ENCOUNTER — Other Ambulatory Visit: Payer: Self-pay | Admitting: Family Medicine

## 2014-04-08 NOTE — Telephone Encounter (Signed)
Ok to refill??  Last office visit 03/08/2014.  Last refill 11/10/2013, #3 refills.

## 2014-04-08 NOTE — Telephone Encounter (Signed)
Okay to refill? 

## 2014-04-12 NOTE — Telephone Encounter (Signed)
Medication called to pharmacy. 

## 2014-06-16 ENCOUNTER — Other Ambulatory Visit: Payer: Managed Care, Other (non HMO)

## 2014-06-16 DIAGNOSIS — Z125 Encounter for screening for malignant neoplasm of prostate: Secondary | ICD-10-CM

## 2014-06-16 DIAGNOSIS — I10 Essential (primary) hypertension: Secondary | ICD-10-CM

## 2014-06-16 DIAGNOSIS — E291 Testicular hypofunction: Secondary | ICD-10-CM

## 2014-06-16 DIAGNOSIS — Z0000 Encounter for general adult medical examination without abnormal findings: Secondary | ICD-10-CM

## 2014-06-16 DIAGNOSIS — Z79899 Other long term (current) drug therapy: Secondary | ICD-10-CM

## 2014-06-16 LAB — CBC WITH DIFFERENTIAL/PLATELET
BASOS ABS: 0 10*3/uL (ref 0.0–0.1)
BASOS PCT: 0 % (ref 0–1)
EOS ABS: 0.2 10*3/uL (ref 0.0–0.7)
EOS PCT: 3 % (ref 0–5)
HCT: 42.4 % (ref 39.0–52.0)
HEMOGLOBIN: 14.6 g/dL (ref 13.0–17.0)
LYMPHS ABS: 1.3 10*3/uL (ref 0.7–4.0)
Lymphocytes Relative: 22 % (ref 12–46)
MCH: 30.7 pg (ref 26.0–34.0)
MCHC: 34.4 g/dL (ref 30.0–36.0)
MCV: 89.1 fL (ref 78.0–100.0)
MPV: 10.2 fL (ref 8.6–12.4)
Monocytes Absolute: 0.4 10*3/uL (ref 0.1–1.0)
Monocytes Relative: 6 % (ref 3–12)
NEUTROS PCT: 69 % (ref 43–77)
Neutro Abs: 4.2 10*3/uL (ref 1.7–7.7)
PLATELETS: 215 10*3/uL (ref 150–400)
RBC: 4.76 MIL/uL (ref 4.22–5.81)
RDW: 13.2 % (ref 11.5–15.5)
WBC: 6.1 10*3/uL (ref 4.0–10.5)

## 2014-06-17 ENCOUNTER — Ambulatory Visit (INDEPENDENT_AMBULATORY_CARE_PROVIDER_SITE_OTHER): Payer: Managed Care, Other (non HMO) | Admitting: Family Medicine

## 2014-06-17 ENCOUNTER — Encounter: Payer: Self-pay | Admitting: Family Medicine

## 2014-06-17 VITALS — BP 132/80 | HR 76 | Temp 98.5°F | Resp 14 | Ht 73.0 in | Wt 183.0 lb

## 2014-06-17 DIAGNOSIS — E291 Testicular hypofunction: Secondary | ICD-10-CM

## 2014-06-17 DIAGNOSIS — Z0000 Encounter for general adult medical examination without abnormal findings: Secondary | ICD-10-CM

## 2014-06-17 DIAGNOSIS — R438 Other disturbances of smell and taste: Secondary | ICD-10-CM

## 2014-06-17 DIAGNOSIS — I10 Essential (primary) hypertension: Secondary | ICD-10-CM

## 2014-06-17 DIAGNOSIS — G4700 Insomnia, unspecified: Secondary | ICD-10-CM | POA: Diagnosis not present

## 2014-06-17 DIAGNOSIS — R439 Unspecified disturbances of smell and taste: Secondary | ICD-10-CM

## 2014-06-17 DIAGNOSIS — R7989 Other specified abnormal findings of blood chemistry: Secondary | ICD-10-CM

## 2014-06-17 LAB — COMPREHENSIVE METABOLIC PANEL
ALT: 24 U/L (ref 0–53)
AST: 16 U/L (ref 0–37)
Albumin: 4.6 g/dL (ref 3.5–5.2)
Alkaline Phosphatase: 50 U/L (ref 39–117)
BUN: 17 mg/dL (ref 6–23)
CO2: 26 meq/L (ref 19–32)
CREATININE: 0.79 mg/dL (ref 0.50–1.35)
Calcium: 9.8 mg/dL (ref 8.4–10.5)
Chloride: 104 mEq/L (ref 96–112)
Glucose, Bld: 101 mg/dL — ABNORMAL HIGH (ref 70–99)
Potassium: 4 mEq/L (ref 3.5–5.3)
Sodium: 141 mEq/L (ref 135–145)
Total Bilirubin: 0.9 mg/dL (ref 0.2–1.2)
Total Protein: 7.1 g/dL (ref 6.0–8.3)

## 2014-06-17 LAB — PSA: PSA: 0.77 ng/mL (ref <=4.00)

## 2014-06-17 LAB — LIPID PANEL
Cholesterol: 165 mg/dL (ref 0–200)
HDL: 44 mg/dL (ref >=40)
LDL CALC: 108 mg/dL — AB (ref 0–99)
Total CHOL/HDL Ratio: 3.8 Ratio
Triglycerides: 65 mg/dL (ref <150)
VLDL: 13 mg/dL (ref 0–40)

## 2014-06-17 LAB — TESTOSTERONE: Testosterone: 125 ng/dL — ABNORMAL LOW (ref 300–890)

## 2014-06-17 MED ORDER — CYCLOBENZAPRINE HCL 10 MG PO TABS: 10.0000 mg | ORAL_TABLET | Freq: Three times a day (TID) | ORAL | Status: DC | PRN

## 2014-06-17 MED ORDER — ZOLPIDEM TARTRATE ER 12.5 MG PO TBCR: 12.5000 mg | EXTENDED_RELEASE_TABLET | Freq: Every evening | ORAL | Status: DC | PRN

## 2014-06-17 MED ORDER — METOPROLOL SUCCINATE ER 100 MG PO TB24: 100.0000 mg | ORAL_TABLET | Freq: Every day | ORAL | Status: DC

## 2014-06-17 MED ORDER — HYDROCHLOROTHIAZIDE 25 MG PO TABS: 25.0000 mg | ORAL_TABLET | Freq: Every day | ORAL | Status: DC

## 2014-06-17 MED ORDER — TESTOSTERONE CYPIONATE 200 MG/ML IM SOLN: 200.0000 mg | INTRAMUSCULAR | Status: DC

## 2014-06-17 MED ORDER — AMLODIPINE BESYLATE 5 MG PO TABS: 5.0000 mg | ORAL_TABLET | Freq: Every day | ORAL | Status: DC

## 2014-06-17 NOTE — Patient Instructions (Addendum)
[facility] - Release of records Take probiotic for next month Change testosterone to every 2 weeks- give 1 ml  COntinue blood pressure medication I will try the Ambien 12.5mg  extended release  F/U 3 months - for testosterone

## 2014-06-19 NOTE — Progress Notes (Signed)
Patient ID: ZAI CHMIEL, male   DOB: 04-14-1955, 59 y.o.   MRN: [id number]   Subjective:    Patient ID: LAROY MUSTARD, male    DOB: 1955-03-04, 59 y.o.   MRN: [id number]  Patient presents for CPE Continues to have difficulty with taste buds and decreased appetite since he had viral gastroenteritis during the winter. He took some probiotics which helped. Denies fever, N/V and paim, change in bowels, no discomfort with eating, just feels like he cant taste anything.  Taking HTN meds as prescribed  Prevention- UTD  Low testosterone- levels very low,taking 261ml per month, still has fatigue and low libido   Review Of Systems:  GEN- denies fatigue, fever, weight loss,weakness, recent illness HEENT- denies eye drainage, change in vision, nasal discharge, CVS- denies chest pain, palpitations RESP- denies SOB, cough, wheeze ABD- denies N/V, change in stools, abd pain GU- denies dysuria, hematuria, dribbling, incontinence MSK- denies joint pain, muscle aches, injury Neuro- denies headache, dizziness, syncope, seizure activity       Objective:    BP 132/80 mmHg  Pulse 76  Temp(Src) 98.5 F (36.9 C) (Oral)  Resp 14  Ht 6\' 1"  (1.854 m)  Wt 183 lb (83.008 kg)  BMI 24.15 kg/m2 GEN- NAD, alert and oriented x3 HEENT- PERRL, EOMI, non injected sclera, pink conjunctiva, MMM, oropharynx clear, TL clear bilat no effusion Neck- Supple, no LAD CVS- RRR, no murmur RESP-CTAB ABD-NABS,soft,NT,ND GU- rectum normal tone, FOBT neg, soft stool in vault, mild enlargement of prostate, no nodules EXT- No edema Pulses- Radial, - 2+        Assessment & Plan:      Problem List Items Addressed This Visit    Essential hypertension - Primary   Relevant Medications   metoprolol succinate (TOPROL-XL) 100 MG 24 hr tablet   hydrochlorothiazide (HYDRODIURIL) 25 MG tablet   amLODipine (NORVASC) 5 MG tablet    Other Visit Diagnoses    Routine general medical examination at a health care facility         CPE done, prevention UTD, fasting labs reviewed       Note: This dictation was prepared with Dragon dictation along with smaller phrase technology. Any transcriptional errors that result from this process are unintentional.

## 2014-06-19 NOTE — Assessment & Plan Note (Signed)
Unclear cause, nothing on labs, no true GI symptoms to accompany Probiotics helped some though not quite sure Will have him take daily for another month Due to change he has cut back on intake and has lost a few pounds Will get with ENT and see if they have any recommendation

## 2014-06-19 NOTE — Assessment & Plan Note (Signed)
Trial of ambien 12.5mg  , continues to be issue, can fall asleep but does not stay asleep

## 2014-06-19 NOTE — Assessment & Plan Note (Addendum)
Very low , will change him to 200mg  IM q 2 weeks PSA and Hb normal

## 2014-06-19 NOTE — Assessment & Plan Note (Signed)
Well controlled, no change to meds 

## 2014-06-22 ENCOUNTER — Other Ambulatory Visit: Payer: Self-pay | Admitting: Family Medicine

## 2014-06-23 NOTE — Telephone Encounter (Signed)
Refill denied.   Patient has been switched to Ambien ER 12.5mg  for trial for insomnia.

## 2014-06-24 ENCOUNTER — Other Ambulatory Visit: Payer: Self-pay | Admitting: *Deleted

## 2014-06-24 DIAGNOSIS — R439 Unspecified disturbances of smell and taste: Secondary | ICD-10-CM

## 2014-09-19 ENCOUNTER — Encounter: Payer: Self-pay | Admitting: Family Medicine

## 2014-09-19 ENCOUNTER — Ambulatory Visit (INDEPENDENT_AMBULATORY_CARE_PROVIDER_SITE_OTHER): Payer: Managed Care, Other (non HMO) | Admitting: Family Medicine

## 2014-09-19 VITALS — BP 142/80 | HR 78 | Temp 98.8°F | Resp 16 | Ht 73.0 in | Wt 188.0 lb

## 2014-09-19 DIAGNOSIS — I10 Essential (primary) hypertension: Secondary | ICD-10-CM

## 2014-09-19 DIAGNOSIS — G4700 Insomnia, unspecified: Secondary | ICD-10-CM | POA: Diagnosis not present

## 2014-09-19 DIAGNOSIS — E291 Testicular hypofunction: Secondary | ICD-10-CM | POA: Diagnosis not present

## 2014-09-19 DIAGNOSIS — R7989 Other specified abnormal findings of blood chemistry: Secondary | ICD-10-CM

## 2014-09-19 MED ORDER — ZOLPIDEM TARTRATE ER 12.5 MG PO TBCR: 12.5000 mg | EXTENDED_RELEASE_TABLET | Freq: Every evening | ORAL | Status: DC | PRN

## 2014-09-19 MED ORDER — TESTOSTERONE CYPIONATE 200 MG/ML IM SOLN: 200.0000 mg | INTRAMUSCULAR | Status: DC

## 2014-09-19 NOTE — Progress Notes (Signed)
Patient ID: Nathan Stewart, male   DOB: [birth year], 59 y.o.   MRN: [id number]   Subjective:    Patient ID: Nathan Stewart, male    DOB: Nov 19, 1955, 59 y.o.   MRN: [id number]  Patient presents for 3 month F/U  patient here to follow-up chronic medical problem is. Our last visit he was back to taking his testosterone 200 mg every 2 weeks. He does feel a difference with the testosterone he does not have a lot of energy but can tell when he has not taken it at all. We also switched his Ambien to the extended release this is worked very well for him with regards to his sleep.  Hypertension he has been taking all his medicines as prescribed T has not had any symptoms of high blood pressure with any headache or dizziness.  Review Of Systems:  GEN- denies fatigue, fever, weight loss,weakness, recent illness HEENT- denies eye drainage, change in vision, nasal discharge, CVS- denies chest pain, palpitations RESP- denies SOB, cough, wheeze ABD- denies N/V, change in stools, abd pain GU- denies dysuria, hematuria, dribbling, incontinence MSK- denies joint pain, muscle aches, injury Neuro- denies headache, dizziness, syncope, seizure activity       Objective:    BP 142/80 mmHg  Pulse 78  Temp(Src) 98.8 F (37.1 C) (Oral)  Resp 16  Ht 6\' 1"  (1.854 m)  Wt 188 lb (85.276 kg)  BMI 24.81 kg/m2 GEN- NAD, alert and oriented x3 HEENT- PERRL, EOMI, non injected sclera, pink conjunctiva, MMM, oropharynx clear CVS- RRR, no murmur RESP-CTAB EXT- No edema Pulses- Radial  2+        Assessment & Plan:      Problem List Items Addressed This Visit    Low testosterone - Primary   Relevant Orders   Testosterone   Hypogonadism male   Relevant Orders   CBC with Differential/Platelet   Testosterone   Basic metabolic panel   Essential hypertension   Relevant Orders   Basic metabolic panel      Note: This dictation was prepared with Dragon dictation along with smaller phrase technology. Any  transcriptional errors that result from this process are unintentional.

## 2014-09-19 NOTE — Assessment & Plan Note (Signed)
Recheck CBC and testosterone

## 2014-09-19 NOTE — Assessment & Plan Note (Signed)
Continue Ambien ER, doing well with medication

## 2014-09-19 NOTE — Patient Instructions (Addendum)
Continue current medications   Continue long acting ambien We will call with lab results  F/U 6 months

## 2014-09-19 NOTE — Assessment & Plan Note (Signed)
BP minimally elevated today, no change in medication, he will monitor at home

## 2014-09-20 LAB — CBC WITH DIFFERENTIAL/PLATELET
BASOS ABS: 0 10*3/uL (ref 0.0–0.1)
Basophils Relative: 0 % (ref 0–1)
Eosinophils Absolute: 0.2 10*3/uL (ref 0.0–0.7)
Eosinophils Relative: 2 % (ref 0–5)
HEMATOCRIT: 43.4 % (ref 39.0–52.0)
Hemoglobin: 14.9 g/dL (ref 13.0–17.0)
Lymphocytes Relative: 24 % (ref 12–46)
Lymphs Abs: 2.2 10*3/uL (ref 0.7–4.0)
MCH: 31 pg (ref 26.0–34.0)
MCHC: 34.3 g/dL (ref 30.0–36.0)
MCV: 90.4 fL (ref 78.0–100.0)
MPV: 10.3 fL (ref 8.6–12.4)
Monocytes Absolute: 0.8 10*3/uL (ref 0.1–1.0)
Monocytes Relative: 9 % (ref 3–12)
NEUTROS ABS: 5.9 10*3/uL (ref 1.7–7.7)
NEUTROS PCT: 65 % (ref 43–77)
Platelets: 216 10*3/uL (ref 150–400)
RBC: 4.8 MIL/uL (ref 4.22–5.81)
RDW: 13.3 % (ref 11.5–15.5)
WBC: 9 10*3/uL (ref 4.0–10.5)

## 2014-09-20 LAB — BASIC METABOLIC PANEL
BUN: 21 mg/dL (ref 7–25)
CO2: 22 mmol/L (ref 20–31)
Calcium: 9.9 mg/dL (ref 8.6–10.3)
Chloride: 102 mmol/L (ref 98–110)
Creat: 1.04 mg/dL (ref 0.70–1.33)
GLUCOSE: 87 mg/dL (ref 70–99)
Potassium: 3.8 mmol/L (ref 3.5–5.3)
Sodium: 138 mmol/L (ref 135–146)

## 2014-09-20 LAB — TESTOSTERONE: Testosterone: 530 ng/dL (ref 300–890)

## 2014-09-22 ENCOUNTER — Telehealth: Payer: Self-pay | Admitting: Family Medicine

## 2014-10-18 ENCOUNTER — Other Ambulatory Visit: Payer: Self-pay | Admitting: Orthopedic Surgery

## 2014-11-13 ENCOUNTER — Other Ambulatory Visit: Payer: Self-pay | Admitting: Family Medicine

## 2014-11-14 NOTE — Telephone Encounter (Signed)
Per chart review, medication called to pharmacy on 09/19/2014, #3 refills.   Call placed to pharmacy to inquire. Advised that refills are on file; patient had called in old prescription number.   Refill request denied.

## 2014-11-15 NOTE — Pre-Procedure Instructions (Signed)
    Nathan Stewart  11/15/2014      [id number] [place],  - [address] [facility] [address] [place] [address] Phone: [phone] Fax: [phone]    Your procedure is scheduled on Monday, October 3rd, 2016.   Report to [facility] Admitting at 5:30 A.M.  Call this number if you have problems the morning of surgery:  [phone]   Remember:  Do not eat food or drink liquids after midnight Sunday November 27 2014.   Take these medicines the morning of surgery with A SIP OF WATER: Amlodipine (Norvasc), Cyclobenzaprine (Flexeril) if needed, Metoprolol Succinate (Toprol-XL), Tramadol (Ultram) if needed.  7 days prior to surgery, stop taking the following: NSAIDS, Aspirin, Aleve, Naproxen, Ibuprofen, Motrin, Advil, BC's, Goody's, fish oil, all herbal medications, and all vitamins.    Do not wear jewelry.  Do not wear lotions, powders, or colognes.  You may wear deodorant.   Men may shave face and neck.  Do not bring valuables to the hospital.  [facility] is not responsible for any belongings or valuables.  Contacts, dentures or bridgework may not be worn into surgery.  Leave your suitcase in the car.  After surgery it may be brought to your room.  For patients admitted to the hospital, discharge time will be determined by your treatment team.  Patients discharged the day of surgery will not be allowed to drive home.   Special instructions:  See attached.   Please read over the following fact sheets that you were given. Pain Booklet, Coughing and Deep Breathing, Blood Transfusion Information, Total Joint Packet, MRSA Information and Surgical Site Infection Prevention

## 2014-11-16 ENCOUNTER — Ambulatory Visit (HOSPITAL_COMMUNITY)
Admission: RE | Admit: 2014-11-16 | Discharge: 2014-11-16 | Disposition: A | Discharge status: Home / Self Care | Payer: Managed Care, Other (non HMO) | Source: Ambulatory Visit | Attending: Orthopedic Surgery | Admitting: Orthopedic Surgery

## 2014-11-16 ENCOUNTER — Encounter (HOSPITAL_COMMUNITY)
Admission: RE | Admit: 2014-11-16 | Discharge: 2014-11-16 | Disposition: A | Discharge status: Home / Self Care | Payer: Managed Care, Other (non HMO) | Source: Ambulatory Visit | Attending: Orthopedic Surgery | Admitting: Orthopedic Surgery

## 2014-11-16 ENCOUNTER — Encounter (HOSPITAL_COMMUNITY): Payer: Self-pay

## 2014-11-16 DIAGNOSIS — Z01812 Encounter for preprocedural laboratory examination: Secondary | ICD-10-CM | POA: Insufficient documentation

## 2014-11-16 DIAGNOSIS — M179 Osteoarthritis of knee, unspecified: Secondary | ICD-10-CM | POA: Insufficient documentation

## 2014-11-16 DIAGNOSIS — Z01818 Encounter for other preprocedural examination: Secondary | ICD-10-CM | POA: Insufficient documentation

## 2014-11-16 DIAGNOSIS — Z0183 Encounter for blood typing: Secondary | ICD-10-CM | POA: Diagnosis not present

## 2014-11-16 HISTORY — DX: Pneumonia, unspecified organism: J18.9

## 2014-11-16 LAB — URINALYSIS, ROUTINE W REFLEX MICROSCOPIC
Bilirubin Urine: NEGATIVE
Glucose, UA: NEGATIVE mg/dL
Ketones, ur: NEGATIVE mg/dL
LEUKOCYTES UA: NEGATIVE
Nitrite: NEGATIVE
PH: 5.5 (ref 5.0–8.0)
PROTEIN: NEGATIVE mg/dL
SPECIFIC GRAVITY, URINE: 1.009 (ref 1.005–1.030)
Urobilinogen, UA: 0.2 mg/dL (ref 0.0–1.0)

## 2014-11-16 LAB — TYPE AND SCREEN
ABO/RH(D): AB POS
Antibody Screen: NEGATIVE

## 2014-11-16 LAB — CBC WITH DIFFERENTIAL/PLATELET
BASOS ABS: 0 10*3/uL (ref 0.0–0.1)
BASOS PCT: 0 %
EOS ABS: 0.2 10*3/uL (ref 0.0–0.7)
Eosinophils Relative: 3 %
HCT: 46.6 % (ref 39.0–52.0)
Hemoglobin: 16 g/dL (ref 13.0–17.0)
Lymphocytes Relative: 24 %
Lymphs Abs: 1.6 10*3/uL (ref 0.7–4.0)
MCH: 31.1 pg (ref 26.0–34.0)
MCHC: 34.3 g/dL (ref 30.0–36.0)
MCV: 90.5 fL (ref 78.0–100.0)
MONO ABS: 0.4 10*3/uL (ref 0.1–1.0)
MONOS PCT: 6 %
Neutro Abs: 4.4 10*3/uL (ref 1.7–7.7)
Neutrophils Relative %: 67 %
PLATELETS: 200 10*3/uL (ref 150–400)
RBC: 5.15 MIL/uL (ref 4.22–5.81)
RDW: 12.2 % (ref 11.5–15.5)
WBC: 6.6 10*3/uL (ref 4.0–10.5)

## 2014-11-16 LAB — APTT: APTT: 30 s (ref 24–37)

## 2014-11-16 LAB — BASIC METABOLIC PANEL
Anion gap: 8 (ref 5–15)
BUN: 18 mg/dL (ref 6–20)
CALCIUM: 9.7 mg/dL (ref 8.9–10.3)
CO2: 25 mmol/L (ref 22–32)
CREATININE: 0.95 mg/dL (ref 0.61–1.24)
Chloride: 103 mmol/L (ref 101–111)
GLUCOSE: 105 mg/dL — AB (ref 65–99)
Potassium: 3.8 mmol/L (ref 3.5–5.1)
Sodium: 136 mmol/L (ref 135–145)

## 2014-11-16 LAB — SURGICAL PCR SCREEN
MRSA, PCR: NEGATIVE
STAPHYLOCOCCUS AUREUS: NEGATIVE

## 2014-11-16 LAB — PROTIME-INR
INR: 1.02 (ref 0.00–1.49)
PROTHROMBIN TIME: 13.6 s (ref 11.6–15.2)

## 2014-11-16 LAB — URINE MICROSCOPIC-ADD ON

## 2014-11-16 LAB — ABO/RH: ABO/RH(D): AB POS

## 2014-11-16 NOTE — Progress Notes (Addendum)
Patient denies having ever seen a cardiologist for any reason.  Denies Echo, Stress Test and Card Cath.  EKG is available in EPIC from 12/14/13.  PCP is Vic Blackbird, MD.    Patient had a sleep study done in 2011 which he states was positive but that he was given the option to wear a CPAP and currently does not wear one.

## 2014-11-16 NOTE — H&P (Signed)
TOTAL KNEE ADMISSION H&P  Patient is being admitted for left total knee arthroplasty.  Subjective:  Chief Complaint:left knee pain.  HPI: Nathan Stewart, 59 y.o. male, has a history of pain and functional disability in the left knee due to arthritis and has failed non-surgical conservative treatments for greater than 12 weeks to includeNSAID's and/or analgesics, corticosteriod injections, viscosupplementation injections, flexibility and strengthening excercises, weight reduction as appropriate and activity modification.  Onset of symptoms was gradual, starting 3 years ago with gradually worsening course since that time. The patient noted no past surgery on the left knee(s).  Patient currently rates pain in the left knee(s) at 10 out of 10 with activity. Patient has night pain, worsening of pain with activity and weight bearing, pain that interferes with activities of daily living and pain with passive range of motion.  Patient has evidence of subchondral sclerosis, periarticular osteophytes and joint space narrowing by imaging studies.  There is no active infection.  Patient Active Problem List   Diagnosis Date Noted  . Taste disorder 06/19/2014  . Medication side effect 12/14/2013  . Atypical chest pain 12/14/2013  . Cerumen impaction 11/10/2013  . INSOMNIA, CHRONIC 11/13/2009  . SLEEP APNEA 09/20/2009  . Low testosterone 08/14/2009  . Hypogonadism male 02/21/2009  . OSTEOARTHRITIS 06/07/2008  . GEN OSTEOARTHROSIS INVOLVING MULTIPLE SITES 11/10/2007  . LOC OSTEOARTHROS NOT SPEC PRIM/SEC LOWER LEG 11/10/2007  . GERD 01/01/2007  . Essential hypertension 01/01/2007  . MENISCUS TEAR 10/30/2006   Past Medical History  Diagnosis Date  . TESTICULAR HYPOFUNCTION 08/14/2009  . INSOMNIA, CHRONIC 11/13/2009  . GERD 01/01/2007  . 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 GEN OSTEOARTHROSIS INVOLVING MULTIPLE SITES 11/10/2007  . LOC OSTEOARTHROS NOT SPEC PRIM/SEC LOWER LEG 11/10/2007  .  OSTEOARTHRITIS 06/07/2008  . SLEEP APNEA 09/20/2009  . Memory loss 08/27/2007  . LIVER FUNCTION TESTS, ABNORMAL 08/27/2007  . MENISCUS TEAR 10/30/2006  . Allergy     seasonal  . Hypertension     Past Surgical History  Procedure Laterality Date  . Hand surgery      No prescriptions prior to admission   Allergies  Allergen Reactions  . Irbesartan     Tingling, swelling  . Percocet [Oxycodone-Acetaminophen] Nausea And Vomiting    Social History  Substance Use Topics  . Smoking status: Former Research scientist (life sciences)  . Smokeless tobacco: Never Used     Comment: quit 1999  . Alcohol Use: 8.4 oz/week    14 Cans of beer per week     Comment: beer    Family History  Problem Relation Age of Onset  . Arthritis Mother   . Heart disease Mother   . Hypertension Mother   . Hypertension Father   . Stroke Father   . Hypertension Sister   . Stroke Brother      Review of Systems  Constitutional: Negative.   HENT: Negative.   Eyes: Negative.   Respiratory: Negative.   Cardiovascular: Negative.   Gastrointestinal: Negative.   Genitourinary: Negative.   Musculoskeletal: Positive for joint pain.  Skin: Negative.   Neurological: Negative.   Endo/Heme/Allergies: Negative.   Psychiatric/Behavioral: Negative.     Objective:  Physical Exam  Constitutional: He is oriented to person, place, and time. He appears well-developed and well-nourished.  HENT:  Head: Normocephalic and atraumatic.  Eyes: Pupils are equal, round, and reactive to light.  Neck: Normal range of motion. Neck supple.  Cardiovascular: Intact distal pulses.   Musculoskeletal: He exhibits tenderness.  Tender area valgus  deformity to the left knee which comes to full extension and flexes 110.  One plus laxity to varus testing good endpoint.  Skin is intact.  Neurovascular intact.  One plus effusion.  Neurological: He is alert and oriented to person, place, and time.  Skin: Skin is warm and dry.  Psychiatric: He has a normal mood and  affect. His behavior is normal. Judgment and thought content normal.    Vital signs in last 24 hours: Temp:  [98.3 F (36.8 C)] 98.3 F (36.8 C) (09/21 0818) Pulse Rate:  [63] 63 (09/21 0818) Resp:  [20] 20 (09/21 0818) BP: (152)/(75) 152/75 mmHg (09/21 0818) SpO2:  [97 %] 97 % (09/21 0818) Weight:  [83.099 kg (183 lb 3.2 oz)] 83.099 kg (183 lb 3.2 oz) (09/21 0818)  Labs:   Estimated body mass index is 24.81 kg/(m^2) as calculated from the following:   Height as of 09/19/14: 6\' 1"  (1.854 m).   Weight as of 09/19/14: 85.276 kg (188 lb).   Imaging Review Plain radiographs demonstrate AP, Rosenberg, lateral and sunrise x-rays of the left knee shows end-stage arthritis of the lateral compartment erosion of the femur into the tibia and also impressive arthritis of the patellofemoral joint with a large suprapatellar spur.  Assessment/Plan:  End stage arthritis, left knee   The patient history, physical examination, clinical judgment of the provider and imaging studies are consistent with end stage degenerative joint disease of the left knee(s) and total knee arthroplasty is deemed medically necessary. The treatment options including medical management, injection therapy arthroscopy and arthroplasty were discussed at length. The risks and benefits of total knee arthroplasty were presented and reviewed. The risks due to aseptic loosening, infection, stiffness, patella tracking problems, thromboembolic complications and other imponderables were discussed. The patient acknowledged the explanation, agreed to proceed with the plan and consent was signed. Patient is being admitted for inpatient treatment for surgery, pain control, PT, OT, prophylactic antibiotics, VTE prophylaxis, progressive ambulation and ADL's and discharge planning. The patient is planning to be discharged home with home health services

## 2014-11-27 DIAGNOSIS — M1712 Unilateral primary osteoarthritis, left knee: Secondary | ICD-10-CM | POA: Diagnosis present

## 2014-11-27 MED ORDER — BUPIVACAINE LIPOSOME 1.3 % IJ SUSP
20.0000 mL | Freq: Once | INTRAMUSCULAR | Status: AC
  Administered 2014-11-28: 20 mL
  Filled 2014-11-27: qty 20

## 2014-11-27 MED ORDER — TRANEXAMIC ACID 1000 MG/10ML IV SOLN
1000.0000 mg | INTRAVENOUS | Status: AC
  Administered 2014-11-28: 1000 mg via INTRAVENOUS
  Filled 2014-11-27: qty 10

## 2014-11-27 MED ORDER — CEFAZOLIN SODIUM-DEXTROSE 2-3 GM-% IV SOLR
2.0000 g | INTRAVENOUS | Status: AC
  Administered 2014-11-28: 2 g via INTRAVENOUS
  Filled 2014-11-27: qty 50

## 2014-11-28 ENCOUNTER — Inpatient Hospital Stay (HOSPITAL_COMMUNITY)
Admission: RE | Admit: 2014-11-28 | Discharge: 2014-11-29 | DRG: 470 | Disposition: A | Discharge status: Home Health | Payer: Managed Care, Other (non HMO) | Source: Ambulatory Visit | Attending: Orthopedic Surgery | Admitting: Orthopedic Surgery

## 2014-11-28 ENCOUNTER — Inpatient Hospital Stay (HOSPITAL_COMMUNITY): Payer: Managed Care, Other (non HMO) | Admitting: Anesthesiology

## 2014-11-28 ENCOUNTER — Encounter (HOSPITAL_COMMUNITY): Payer: Self-pay | Admitting: Surgery

## 2014-11-28 ENCOUNTER — Encounter (HOSPITAL_COMMUNITY)
Admission: RE | Disposition: A | Discharge status: Home Health | Payer: Self-pay | Source: Ambulatory Visit | Attending: Orthopedic Surgery

## 2014-11-28 DIAGNOSIS — E291 Testicular hypofunction: Secondary | ICD-10-CM | POA: Diagnosis present

## 2014-11-28 DIAGNOSIS — G4700 Insomnia, unspecified: Secondary | ICD-10-CM | POA: Diagnosis present

## 2014-11-28 DIAGNOSIS — M25562 Pain in left knee: Secondary | ICD-10-CM | POA: Diagnosis present

## 2014-11-28 DIAGNOSIS — N528 Other male erectile dysfunction: Secondary | ICD-10-CM | POA: Diagnosis present

## 2014-11-28 DIAGNOSIS — D62 Acute posthemorrhagic anemia: Secondary | ICD-10-CM | POA: Diagnosis not present

## 2014-11-28 DIAGNOSIS — I10 Essential (primary) hypertension: Secondary | ICD-10-CM | POA: Diagnosis present

## 2014-11-28 DIAGNOSIS — M1712 Unilateral primary osteoarthritis, left knee: Principal | ICD-10-CM | POA: Diagnosis present

## 2014-11-28 DIAGNOSIS — Z8249 Family history of ischemic heart disease and other diseases of the circulatory system: Secondary | ICD-10-CM | POA: Diagnosis not present

## 2014-11-28 DIAGNOSIS — Z823 Family history of stroke: Secondary | ICD-10-CM | POA: Diagnosis not present

## 2014-11-28 DIAGNOSIS — G4730 Sleep apnea, unspecified: Secondary | ICD-10-CM | POA: Diagnosis present

## 2014-11-28 DIAGNOSIS — Z87891 Personal history of nicotine dependence: Secondary | ICD-10-CM

## 2014-11-28 DIAGNOSIS — K219 Gastro-esophageal reflux disease without esophagitis: Secondary | ICD-10-CM | POA: Diagnosis present

## 2014-11-28 DIAGNOSIS — M1710 Unilateral primary osteoarthritis, unspecified knee: Secondary | ICD-10-CM | POA: Diagnosis present

## 2014-11-28 HISTORY — PX: TOTAL KNEE ARTHROPLASTY: SHX125

## 2014-11-28 SURGERY — ARTHROPLASTY, KNEE, TOTAL
Anesthesia: Spinal | Site: Knee | Laterality: Left

## 2014-11-28 MED ORDER — DOCUSATE SODIUM 100 MG PO CAPS
100.0000 mg | ORAL_CAPSULE | Freq: Two times a day (BID) | ORAL | Status: DC
Start: 2014-11-28 — End: 2014-11-29
  Administered 2014-11-28 – 2014-11-29 (×2): 100 mg via ORAL
  Filled 2014-11-28 (×2): qty 1

## 2014-11-28 MED ORDER — ONDANSETRON HCL 4 MG PO TABS
4.0000 mg | ORAL_TABLET | Freq: Four times a day (QID) | ORAL | Status: DC | PRN
  Administered 2014-11-29: 4 mg via ORAL
  Filled 2014-11-28: qty 1

## 2014-11-28 MED ORDER — DIPHENHYDRAMINE HCL 12.5 MG/5ML PO ELIX: 12.5000 mg | ORAL_SOLUTION | ORAL | Status: DC | PRN

## 2014-11-28 MED ORDER — DEXTROSE-NACL 5-0.45 % IV SOLN: INTRAVENOUS | Status: DC

## 2014-11-28 MED ORDER — CEFUROXIME SODIUM 1.5 G IJ SOLR
INTRAMUSCULAR | Status: DC | PRN
  Administered 2014-11-28: 1.5 g

## 2014-11-28 MED ORDER — SODIUM CHLORIDE 0.9 % IJ SOLN
INTRAMUSCULAR | Status: AC
  Filled 2014-11-28: qty 10

## 2014-11-28 MED ORDER — LIDOCAINE HCL (CARDIAC) 20 MG/ML IV SOLN
INTRAVENOUS | Status: AC
  Filled 2014-11-28: qty 5

## 2014-11-28 MED ORDER — ALUM & MAG HYDROXIDE-SIMETH 200-200-20 MG/5ML PO SUSP: 30.0000 mL | ORAL | Status: DC | PRN

## 2014-11-28 MED ORDER — CEFUROXIME SODIUM 1.5 G IJ SOLR
INTRAMUSCULAR | Status: AC
  Filled 2014-11-28: qty 1.5

## 2014-11-28 MED ORDER — ONDANSETRON HCL 4 MG/2ML IJ SOLN
INTRAMUSCULAR | Status: DC | PRN
  Administered 2014-11-28: 4 mg via INTRAVENOUS

## 2014-11-28 MED ORDER — GLYCOPYRROLATE 0.2 MG/ML IJ SOLN
INTRAMUSCULAR | Status: AC
  Filled 2014-11-28: qty 1

## 2014-11-28 MED ORDER — PROPOFOL 10 MG/ML IV BOLUS
INTRAVENOUS | Status: AC
  Filled 2014-11-28: qty 20

## 2014-11-28 MED ORDER — HYDROCODONE-ACETAMINOPHEN 5-325 MG PO TABS: 1.0000 | ORAL_TABLET | Freq: Four times a day (QID) | ORAL | Status: DC | PRN

## 2014-11-28 MED ORDER — METOCLOPRAMIDE HCL 5 MG/ML IJ SOLN: 5.0000 mg | Freq: Three times a day (TID) | INTRAMUSCULAR | Status: DC | PRN

## 2014-11-28 MED ORDER — AMLODIPINE BESYLATE 5 MG PO TABS
5.0000 mg | ORAL_TABLET | Freq: Every day | ORAL | Status: DC
  Administered 2014-11-29: 5 mg via ORAL
  Filled 2014-11-28: qty 1

## 2014-11-28 MED ORDER — SODIUM CHLORIDE 0.9 % IJ SOLN
INTRAMUSCULAR | Status: DC | PRN
  Administered 2014-11-28: 40 mL via INTRAVENOUS

## 2014-11-28 MED ORDER — FLEET ENEMA 7-19 GM/118ML RE ENEM: 1.0000 | ENEMA | Freq: Once | RECTAL | Status: DC | PRN

## 2014-11-28 MED ORDER — HYDROCODONE-ACETAMINOPHEN 5-325 MG PO TABS
1.0000 | ORAL_TABLET | ORAL | Status: DC | PRN
  Administered 2014-11-28: 2 via ORAL

## 2014-11-28 MED ORDER — FENTANYL CITRATE (PF) 250 MCG/5ML IJ SOLN
INTRAMUSCULAR | Status: AC
  Filled 2014-11-28: qty 5

## 2014-11-28 MED ORDER — SODIUM CHLORIDE 0.9 % IR SOLN
Status: DC | PRN
  Administered 2014-11-28: 4000 mL

## 2014-11-28 MED ORDER — HYDROMORPHONE HCL 1 MG/ML IJ SOLN
INTRAMUSCULAR | Status: AC
  Filled 2014-11-28: qty 1

## 2014-11-28 MED ORDER — METHOCARBAMOL 500 MG PO TABS
500.0000 mg | ORAL_TABLET | Freq: Four times a day (QID) | ORAL | Status: DC | PRN
  Administered 2014-11-28 – 2014-11-29 (×5): 500 mg via ORAL
  Filled 2014-11-28 (×6): qty 1

## 2014-11-28 MED ORDER — MIDAZOLAM HCL 2 MG/2ML IJ SOLN
INTRAMUSCULAR | Status: AC
  Filled 2014-11-28: qty 4

## 2014-11-28 MED ORDER — HYDROCHLOROTHIAZIDE 25 MG PO TABS
25.0000 mg | ORAL_TABLET | Freq: Every day | ORAL | Status: DC
  Administered 2014-11-29: 25 mg via ORAL
  Filled 2014-11-28: qty 1

## 2014-11-28 MED ORDER — HYDROMORPHONE HCL 1 MG/ML IJ SOLN
0.2500 mg | INTRAMUSCULAR | Status: DC | PRN
  Administered 2014-11-28: 0.5 mg via INTRAVENOUS

## 2014-11-28 MED ORDER — CHLORHEXIDINE GLUCONATE 4 % EX LIQD: 60.0000 mL | Freq: Once | CUTANEOUS | Status: DC

## 2014-11-28 MED ORDER — ONDANSETRON HCL 4 MG/2ML IJ SOLN
INTRAMUSCULAR | Status: AC
  Filled 2014-11-28: qty 2

## 2014-11-28 MED ORDER — MENTHOL 3 MG MT LOZG: 1.0000 | LOZENGE | OROMUCOSAL | Status: DC | PRN

## 2014-11-28 MED ORDER — ACETAMINOPHEN 650 MG RE SUPP: 650.0000 mg | Freq: Four times a day (QID) | RECTAL | Status: DC | PRN

## 2014-11-28 MED ORDER — HYDROCODONE-ACETAMINOPHEN 7.5-325 MG PO TABS: 1.0000 | ORAL_TABLET | Freq: Once | ORAL | Status: DC | PRN

## 2014-11-28 MED ORDER — METHOCARBAMOL 500 MG PO TABS
ORAL_TABLET | ORAL | Status: AC
  Filled 2014-11-28: qty 1

## 2014-11-28 MED ORDER — ASPIRIN EC 325 MG PO TBEC
325.0000 mg | DELAYED_RELEASE_TABLET | Freq: Every day | ORAL | Status: DC
  Administered 2014-11-29: 325 mg via ORAL
  Filled 2014-11-28: qty 1

## 2014-11-28 MED ORDER — KCL IN DEXTROSE-NACL 20-5-0.45 MEQ/L-%-% IV SOLN
INTRAVENOUS | Status: AC
  Filled 2014-11-28: qty 1000

## 2014-11-28 MED ORDER — PHENOL 1.4 % MT LIQD: 1.0000 | OROMUCOSAL | Status: DC | PRN

## 2014-11-28 MED ORDER — HYDROMORPHONE HCL 2 MG PO TABS: 2.0000 mg | ORAL_TABLET | Freq: Four times a day (QID) | ORAL | Status: DC | PRN

## 2014-11-28 MED ORDER — DEXTROSE 5 % IV SOLN
500.0000 mg | Freq: Four times a day (QID) | INTRAVENOUS | Status: DC | PRN
  Filled 2014-11-28 (×2): qty 5

## 2014-11-28 MED ORDER — PROMETHAZINE HCL 25 MG/ML IJ SOLN: 6.2500 mg | INTRAMUSCULAR | Status: DC | PRN

## 2014-11-28 MED ORDER — LACTATED RINGERS IV SOLN
INTRAVENOUS | Status: DC | PRN
  Administered 2014-11-28 (×2): via INTRAVENOUS

## 2014-11-28 MED ORDER — METOCLOPRAMIDE HCL 5 MG PO TABS: 5.0000 mg | ORAL_TABLET | Freq: Three times a day (TID) | ORAL | Status: DC | PRN

## 2014-11-28 MED ORDER — MIDAZOLAM HCL 5 MG/5ML IJ SOLN
INTRAMUSCULAR | Status: DC | PRN
  Administered 2014-11-28 (×2): 1 mg via INTRAVENOUS

## 2014-11-28 MED ORDER — KCL IN DEXTROSE-NACL 20-5-0.45 MEQ/L-%-% IV SOLN
INTRAVENOUS | Status: DC
  Administered 2014-11-28: 125 mL/h via INTRAVENOUS
  Administered 2014-11-28: 18:00:00 via INTRAVENOUS
  Filled 2014-11-28: qty 1000

## 2014-11-28 MED ORDER — ASPIRIN EC 325 MG PO TBEC: 325.0000 mg | DELAYED_RELEASE_TABLET | Freq: Two times a day (BID) | ORAL | Status: DC

## 2014-11-28 MED ORDER — SENNOSIDES-DOCUSATE SODIUM 8.6-50 MG PO TABS: 1.0000 | ORAL_TABLET | Freq: Every evening | ORAL | Status: DC | PRN

## 2014-11-28 MED ORDER — SUCCINYLCHOLINE CHLORIDE 20 MG/ML IJ SOLN
INTRAMUSCULAR | Status: AC
  Filled 2014-11-28: qty 1

## 2014-11-28 MED ORDER — ACETAMINOPHEN 325 MG PO TABS
650.0000 mg | ORAL_TABLET | Freq: Four times a day (QID) | ORAL | Status: DC | PRN
  Administered 2014-11-28: 650 mg via ORAL
  Filled 2014-11-28: qty 2

## 2014-11-28 MED ORDER — PROPOFOL 500 MG/50ML IV EMUL
INTRAVENOUS | Status: DC | PRN
  Administered 2014-11-28: 50 ug/kg/min via INTRAVENOUS

## 2014-11-28 MED ORDER — ROCURONIUM BROMIDE 50 MG/5ML IV SOLN
INTRAVENOUS | Status: AC
  Filled 2014-11-28: qty 1

## 2014-11-28 MED ORDER — HYDROCODONE-ACETAMINOPHEN 5-325 MG PO TABS
1.0000 | ORAL_TABLET | ORAL | Status: DC | PRN
  Administered 2014-11-28 – 2014-11-29 (×5): 2 via ORAL
  Filled 2014-11-28 (×5): qty 2

## 2014-11-28 MED ORDER — BISACODYL 5 MG PO TBEC: 5.0000 mg | DELAYED_RELEASE_TABLET | Freq: Every day | ORAL | Status: DC | PRN

## 2014-11-28 MED ORDER — HYDROCODONE-ACETAMINOPHEN 5-325 MG PO TABS
ORAL_TABLET | ORAL | Status: AC
  Filled 2014-11-28: qty 2

## 2014-11-28 MED ORDER — HYDROMORPHONE HCL 1 MG/ML IJ SOLN
1.0000 mg | INTRAMUSCULAR | Status: DC | PRN
  Administered 2014-11-28 (×3): 1 mg via INTRAVENOUS
  Filled 2014-11-28 (×4): qty 1

## 2014-11-28 MED ORDER — METHOCARBAMOL 500 MG PO TABS: 500.0000 mg | ORAL_TABLET | Freq: Two times a day (BID) | ORAL | Status: DC

## 2014-11-28 MED ORDER — BUPIVACAINE IN DEXTROSE 0.75-8.25 % IT SOLN
INTRATHECAL | Status: DC | PRN
  Administered 2014-11-28: 1.8 mL via INTRATHECAL

## 2014-11-28 MED ORDER — LIDOCAINE HCL (CARDIAC) 20 MG/ML IV SOLN
INTRAVENOUS | Status: DC | PRN
  Administered 2014-11-28 (×2): 20 mg via INTRAVENOUS
  Administered 2014-11-28: 40 mg via INTRAVENOUS

## 2014-11-28 MED ORDER — ONDANSETRON HCL 4 MG/2ML IJ SOLN: 4.0000 mg | Freq: Four times a day (QID) | INTRAMUSCULAR | Status: DC | PRN

## 2014-11-28 MED ORDER — FENTANYL CITRATE (PF) 100 MCG/2ML IJ SOLN
INTRAMUSCULAR | Status: DC | PRN
  Administered 2014-11-28 (×5): 50 ug via INTRAVENOUS

## 2014-11-28 MED ORDER — METOPROLOL SUCCINATE ER 100 MG PO TB24
100.0000 mg | ORAL_TABLET | Freq: Every day | ORAL | Status: DC
  Administered 2014-11-29: 100 mg via ORAL
  Filled 2014-11-28: qty 1

## 2014-11-28 MED ORDER — PROPOFOL 10 MG/ML IV BOLUS
INTRAVENOUS | Status: DC | PRN
  Administered 2014-11-28: 20 mg via INTRAVENOUS
  Administered 2014-11-28: 30 mg via INTRAVENOUS
  Administered 2014-11-28 (×2): 20 mg via INTRAVENOUS

## 2014-11-28 MED ORDER — EPHEDRINE SULFATE 50 MG/ML IJ SOLN
INTRAMUSCULAR | Status: AC
  Filled 2014-11-28: qty 1

## 2014-11-28 SURGICAL SUPPLY — 61 items
BANDAGE ESMARK 6X9 LF (GAUZE/BANDAGES/DRESSINGS) ×1 IMPLANT
BLADE SAG 18X100X1.27 (BLADE) ×3 IMPLANT
BLADE SAW SGTL 13X75X1.27 (BLADE) ×2 IMPLANT
BLADE SURG ROTATE 9660 (MISCELLANEOUS) IMPLANT
BNDG CMPR 9X6 STRL LF SNTH (GAUZE/BANDAGES/DRESSINGS) ×1
BNDG CMPR MED 10X6 ELC LF (GAUZE/BANDAGES/DRESSINGS) ×1
BNDG CMPR MED 15X6 ELC VLCR LF (GAUZE/BANDAGES/DRESSINGS) ×1
BNDG ELASTIC 6X10 VLCR STRL LF (GAUZE/BANDAGES/DRESSINGS) ×2 IMPLANT
BNDG ELASTIC 6X15 VLCR STRL LF (GAUZE/BANDAGES/DRESSINGS) ×1 IMPLANT
BNDG ESMARK 6X9 LF (GAUZE/BANDAGES/DRESSINGS) ×2
BOWL SMART MIX CTS (DISPOSABLE) ×2 IMPLANT
CAPT KNEE TOTAL 3 ATTUNE ×1 IMPLANT
CEMENT HV SMART SET (Cement) ×4 IMPLANT
COVER SURGICAL LIGHT HANDLE (MISCELLANEOUS) ×2 IMPLANT
CUFF TOURNIQUET SINGLE 34IN LL (TOURNIQUET CUFF) ×2 IMPLANT
CUFF TOURNIQUET SINGLE 44IN (TOURNIQUET CUFF) IMPLANT
DRAPE EXTREMITY T 121X128X90 (DRAPE) ×2 IMPLANT
DRAPE U-SHAPE 47X51 STRL (DRAPES) ×2 IMPLANT
DURAPREP 26ML APPLICATOR (WOUND CARE) ×4 IMPLANT
ELECT REM PT RETURN 9FT ADLT (ELECTROSURGICAL) ×2
ELECTRODE REM PT RTRN 9FT ADLT (ELECTROSURGICAL) ×1 IMPLANT
EVACUATOR 1/8 PVC DRAIN (DRAIN) IMPLANT
GAUZE SPONGE 4X4 12PLY STRL (GAUZE/BANDAGES/DRESSINGS) ×4 IMPLANT
GAUZE XEROFORM 1X8 LF (GAUZE/BANDAGES/DRESSINGS) ×2 IMPLANT
GLOVE BIO SURGEON STRL SZ7.5 (GLOVE) ×2 IMPLANT
GLOVE BIO SURGEON STRL SZ8.5 (GLOVE) ×2 IMPLANT
GLOVE BIOGEL PI IND STRL 8 (GLOVE) ×1 IMPLANT
GLOVE BIOGEL PI IND STRL 9 (GLOVE) ×1 IMPLANT
GLOVE BIOGEL PI INDICATOR 8 (GLOVE) ×1
GLOVE BIOGEL PI INDICATOR 9 (GLOVE) ×1
GOWN STRL REUS W/ TWL LRG LVL3 (GOWN DISPOSABLE) ×1 IMPLANT
GOWN STRL REUS W/ TWL XL LVL3 (GOWN DISPOSABLE) ×2 IMPLANT
GOWN STRL REUS W/TWL LRG LVL3 (GOWN DISPOSABLE) ×2
GOWN STRL REUS W/TWL XL LVL3 (GOWN DISPOSABLE) ×4
HANDPIECE INTERPULSE COAX TIP (DISPOSABLE) ×2
HOOD PEEL AWAY FACE SHEILD DIS (HOOD) ×4 IMPLANT
KIT BASIN OR (CUSTOM PROCEDURE TRAY) ×2 IMPLANT
KIT ROOM TURNOVER OR (KITS) ×2 IMPLANT
MANIFOLD NEPTUNE II (INSTRUMENTS) ×2 IMPLANT
NDL SPNL 18GX3.5 QUINCKE PK (NEEDLE) IMPLANT
NEEDLE SPNL 18GX3.5 QUINCKE PK (NEEDLE) IMPLANT
NS IRRIG 1000ML POUR BTL (IV SOLUTION) ×2 IMPLANT
PACK TOTAL JOINT (CUSTOM PROCEDURE TRAY) ×2 IMPLANT
PACK UNIVERSAL I (CUSTOM PROCEDURE TRAY) ×2 IMPLANT
PAD ARMBOARD 7.5X6 YLW CONV (MISCELLANEOUS) ×4 IMPLANT
PADDING CAST COTTON 6X4 STRL (CAST SUPPLIES) ×2 IMPLANT
SET HNDPC FAN SPRY TIP SCT (DISPOSABLE) ×1 IMPLANT
SPONGE GAUZE 4X4 12PLY STER LF (GAUZE/BANDAGES/DRESSINGS) ×1 IMPLANT
SUT VIC AB 0 CT1 27 (SUTURE) ×2
SUT VIC AB 0 CT1 27XBRD ANBCTR (SUTURE) ×1 IMPLANT
SUT VIC AB 1 CTX 36 (SUTURE) ×2
SUT VIC AB 1 CTX36XBRD ANBCTR (SUTURE) ×1 IMPLANT
SUT VIC AB 2-0 CT1 27 (SUTURE) ×2
SUT VIC AB 2-0 CT1 TAPERPNT 27 (SUTURE) ×1 IMPLANT
SUT VIC AB 3-0 CT1 27 (SUTURE) ×2
SUT VIC AB 3-0 CT1 TAPERPNT 27 (SUTURE) ×1 IMPLANT
SUT VIC AB 3-0 FS2 27 (SUTURE) ×2 IMPLANT
SYR 50ML LL SCALE MARK (SYRINGE) ×2 IMPLANT
TOWEL OR 17X24 6PK STRL BLUE (TOWEL DISPOSABLE) ×2 IMPLANT
TOWEL OR 17X26 10 PK STRL BLUE (TOWEL DISPOSABLE) ×2 IMPLANT
WATER STERILE IRR 1000ML POUR (IV SOLUTION) ×6 IMPLANT

## 2014-11-28 NOTE — Evaluation (Signed)
Physical Therapy Evaluation Patient Details Name: Nathan Stewart MRN: [id number] DOB: 06/09/1955 Today's Date: 11/28/2014   History of Present Illness  Patient is a 59 y/o male s/p left TKA. PMH includes HTN, PNA, memory loss.  Clinical Impression  Patient presents with pain and post surgical deficits LLE s/p left TKA. Pt tolerated standing and transfers with Min-Mod A for balance/safety secondary to decreased sensation BLEs and lethargy. Family present in room. Instructed pt and family in exercises,  zero degree knee and precautions. May benefit from knee immobilizer until sensation returns for safety as pt with left knee buckling during mobility. Will follow acutely to maximize independence and mobility prior to return home.     Follow Up Recommendations Home health PT;Supervision/Assistance - 24 hour    Equipment Recommendations  None recommended by PT    Recommendations for Other Services OT consult     Precautions / Restrictions Precautions Precautions: Knee;Fall Precaution Booklet Issued: No Precaution Comments: Reviewed no pillow under knee and precautions. Restrictions Weight Bearing Restrictions: Yes LLE Weight Bearing: Weight bearing as tolerated      Mobility  Bed Mobility Overal bed mobility: Needs Assistance Bed Mobility: Supine to Sit     Supine to sit: Supervision;HOB elevated     General bed mobility comments: Pt using UEs to bring LLE to EOB. Use of rails.   Transfers Overall transfer level: Needs assistance Equipment used: Rolling walker (2 wheeled) Transfers: Sit to/from [facility] Sit to Stand: Mod assist Stand pivot transfers: Min assist       General transfer comment: Cues for hand placement. Multiple attempts to stand with difficulty obtaining upright secondary to decreased sensation BLEs. left knee buckling upon standing. Mod A to boost up to standing. Anterior lean. SPT bed to chair with Min A for balance. Left knee  instability noted.   Ambulation/Gait Ambulation/Gait assistance:  (Deferred secondary to safety concerns and lethargy/)              Stairs            Wheelchair Mobility    Modified Rankin (Stroke Patients Only)       Balance Overall balance assessment: Needs assistance Sitting-balance support: Feet supported;No upper extremity supported Sitting balance-Leahy Scale: Fair     Standing balance support: During functional activity Standing balance-Leahy Scale: Poor Standing balance comment: Relient on RW for support and Min A for balance.                              Pertinent Vitals/Pain Pain Assessment: Faces Faces Pain Scale: Hurts a little bit Pain Location: left knee Pain Descriptors / Indicators: Sore Pain Intervention(s): Monitored during session;Repositioned    Home Living Family/patient expects to be discharged to:: Private residence Living Arrangements: Spouse/significant other;Children Available Help at Discharge: Family;Available 24 hours/day Type of Home: House Home Access: Stairs to enter Entrance Stairs-Rails: Right Entrance Stairs-Number of Steps: 4 Home Layout: One level Home Equipment: Walker - 2 wheels;Bedside commode      Prior Function Level of Independence: Independent         Comments: Works for tobacco company.     Hand Dominance        Extremity/Trunk Assessment   Upper Extremity Assessment: Defer to OT evaluation           Lower Extremity Assessment: LLE deficits/detail;RLE deficits/detail   LLE Deficits / Details: Limited AROM/strength secondary to pain and surgery.  Communication   Communication: No difficulties  Cognition Arousal/Alertness: Lethargic;Suspect due to medications Behavior During Therapy: [facility] for tasks assessed/performed Overall Cognitive Status: Difficult to assess                      General Comments      Exercises Total Joint Exercises Ankle Circles/Pumps:  Both;10 reps;Seated Quad Sets: Both;10 reps;Seated Gluteal Sets: Both;10 reps;Seated      Assessment/Plan    PT Assessment Patient needs continued PT services  PT Diagnosis Difficulty walking;Generalized weakness   PT Problem List Decreased strength;Pain;Decreased activity tolerance;Decreased balance;Decreased mobility;Decreased range of motion;Impaired sensation  PT Treatment Interventions Balance training;Gait training;Stair training;Functional mobility training;Therapeutic activities;Therapeutic exercise;Patient/family education   PT Goals (Current goals can be found in the Care Plan section) Acute Rehab PT Goals Patient Stated Goal: to get up and walk PT Goal Formulation: With patient Time For Goal Achievement: 12/12/14 Potential to Achieve Goals: Good    Frequency 7X/week   Barriers to discharge        Co-evaluation               End of Session Equipment Utilized During Treatment: Gait belt Activity Tolerance: Patient tolerated treatment well Patient left: in chair;with call bell/phone within reach;with family/visitor present           Time: 9758-8325 PT Time Calculation (min) (ACUTE ONLY): 30 min   Charges:   PT Evaluation $Initial PT Evaluation Tier I: 1 Procedure PT Treatments $Therapeutic Activity: 8-22 mins   PT G Codes:        Britton Bera A Paiton Boultinghouse 11/28/2014, 2:19 PM Wray Kearns, [place], DPT [phone]

## 2014-11-28 NOTE — Anesthesia Postprocedure Evaluation (Signed)
  Anesthesia Post-op Note  Patient: Nathan Stewart  Procedure(s) Performed: Procedure(s) (LRB): TOTAL KNEE ARTHROPLASTY (Left)  Patient Location: PACU  Anesthesia Type: Spinal  Level of Consciousness: awake and alert   Airway and Oxygen Therapy: Patient Spontanous Breathing  Post-op Pain: mild  Post-op Assessment: Post-op Vital signs reviewed, Patient's Cardiovascular Status Stable, Respiratory Function Stable, Patent Airway and No signs of Nausea or vomiting  Last Vitals:  Filed Vitals:   11/28/14 1030  BP:   Pulse: 52  Temp:   Resp: 12    Post-op Vital Signs: stable   Complications: No apparent anesthesia complications

## 2014-11-28 NOTE — Progress Notes (Signed)
Utilization review completed.  

## 2014-11-28 NOTE — Anesthesia Procedure Notes (Addendum)
Spinal Patient location during procedure: OR Staffing Anesthesiologist: JUDD, BENJAMIN Performed by: anesthesiologist  Preanesthetic Checklist Completed: patient identified, site marked, surgical consent, pre-op evaluation, timeout performed, IV checked, risks and benefits discussed and monitors and equipment checked Spinal Block Patient position: sitting Prep: DuraPrep Patient monitoring: heart rate, continuous pulse ox and blood pressure Location: L4-5 Injection technique: single-shot Needle Needle type: Spinocan  Needle gauge: 27 G Needle length: 9 cm Additional Notes Expiration date of kit checked and confirmed. Patient tolerated procedure well, without complications.    Procedure Name: MAC Date/Time: 11/28/2014 7:30 AM Performed by: Scheryl Darter Pre-anesthesia Checklist: Patient identified, Emergency Drugs available, Suction available, Patient being monitored and Timeout performed Patient Re-evaluated:Patient Re-evaluated prior to inductionOxygen Delivery Method: Nasal cannula Placement Confirmation: positive ETCO2 and breath sounds checked- equal and bilateral

## 2014-11-28 NOTE — Transfer of Care (Signed)
Immediate Anesthesia Transfer of Care Note  Patient: Nathan Stewart  Procedure(s) Performed: Procedure(s): TOTAL KNEE ARTHROPLASTY (Left)  Patient Location: PACU  Anesthesia Type:spinal  Level of Consciousness: awake, alert , oriented and sedated  Airway & Oxygen Therapy: Patient Spontanous Breathing and Patient connected to nasal cannula oxygen  Post-op Assessment: Report given to RN, Post -op Vital signs reviewed and stable and Patient moving all extremities  Post vital signs: Reviewed and stable  Last Vitals:  Filed Vitals:   11/28/14 0612  BP: 132/88  Pulse: 65  Temp: 36.7 C  Resp: 20    Complications: No apparent anesthesia complications

## 2014-11-28 NOTE — Discharge Instructions (Signed)

## 2014-11-28 NOTE — Op Note (Signed)
PATIENT ID:      Nathan Stewart  MRN:     [id number] DOB/AGE:    09/09/1955 / 59 y.o.       OPERATIVE REPORT    DATE OF PROCEDURE:  11/28/2014       PREOPERATIVE DIAGNOSIS:   LEFT KNEE OSTEOARTHRITIS LEFT KNEE      Estimated body mass index is 23.51 kg/(m^2) as calculated from the following:   Height as of this encounter: 6\' 2"  (1.88 m).   Weight as of this encounter: 83.093 kg (183 lb 3 oz).                                                        POSTOPERATIVE DIAGNOSIS:   left knee osteoarthritis                                                                       PROCEDURE:  Procedure(s): TOTAL KNEE ARTHROPLASTY Using DepuyAttune RP implants #7L Femur, #8Tibia, 5 mm Attune RP bearing, 41 Patella     SURGEON: Rook Maue J    ASSISTANT:   [employer]   (Present and scrubbed throughout the case, critical for assistance with exposure, retraction, instrumentation, and closure.)         ANESTHESIA: Spinal, Exparel  EBL: 300  FLUID REPLACEMENT: 1600 crystalloid  TOURNIQUET TIME: 33min  Drains: None  Tranexamic Acid: 1gm iv   COMPLICATIONS:  None         INDICATIONS FOR PROCEDURE: The patient has  LEFT KNEE OSTEOARTHRITIS LEFT KNEE, valgus deformities, XR shows bone on bone arthritis. Patient has failed all conservative measures including anti-inflammatory medicines, narcotics, attempts at  exercise and weight loss, cortisone injections and viscosupplementation.  Risks and benefits of surgery have been discussed, questions answered.   DESCRIPTION OF PROCEDURE: The patient identified by armband, received  IV antibiotics, in the holding area at [facility]. Patient taken to the operating room, appropriate anesthetic  monitors were attached, and general endotracheal anesthesia induced with  the patient in supine position. Tourniquet  applied high to the operative thigh. Lateral post and foot positioner  applied to the table, the lower extremity was then prepped and  draped  in usual sterile fashion from the ankle to the tourniquet. Time-out procedure was performed. We began the operation, with the knee flexed 100 degrees, by making the anterior midline incision starting at handbreadth above the patella going over the patella 1 cm medial to and 4 cm distal to the tibial tubercle. Small bleeders in the skin and the  subcutaneous tissue identified and cauterized. Transverse retinaculum was incised and reflected medially and a medial parapatellar arthrotomy was accomplished. the patella was everted and theprepatellar fat pad resected. The superficial medial collateral  ligament was then elevated from anterior to posterior along the proximal  flare of the tibia and anterior half of the menisci resected. The knee was hyperflexed exposing bone on bone arthritis. Peripheral and notch osteophytes as well as the cruciate ligaments were then resected. We continued to  work our way around posteriorly  along the proximal tibia, and externally  rotated the tibia subluxing it out from underneath the femur. A McHale  retractor was placed through the notch and a lateral Hohmann retractor  placed, and we then drilled through the proximal tibia in line with the  axis of the tibia followed by an intramedullary guide rod and 2-degree  posterior slope cutting guide. The tibial cutting guide, 3 degree posterior sloped, was pinned into place allowing resection of 10 mm of bone medially and about 0 mm of bone laterally. Satisfied with the tibial resection, we then  entered the distal femur 2 mm anterior to the PCL origin with the  intramedullary guide rod and applied the distal femoral cutting guide  set at 78mm, with 5 degrees of valgus. This was pinned along the  epicondylar axis. At this point, the distal femoral cut was accomplished without difficulty. We then sized for a #7L femoral component and pinned the guide in 0 degrees of external rotation.The chamfer cutting guide was pinned  into place. The anterior, posterior, and chamfer cuts were accomplished without difficulty followed by  the Attune RP box cutting guide and the box cut. We also removed posterior osteophytes from the posterior femoral condyles. At this  time, the knee was brought into full extension. We checked our  extension and flexion gaps and found them symmetric for a 5 mm bearing. Distracting in extension with a lamina spreader, the posterior horns of the menisci were removed, and Exparel, diluted to 60 cc, was injected into the capsule of the knee. The  posterior patella cut was accomplished with the 9.5 mm Attune cutting guide, sized at 41 dome, and the fixation pegs drilled.The knee  was then once again hyperflexed exposing the proximal tibia. We sized for a #8 tibial base plate, applied the smokestack and the conical reamer followed by the the Delta fin keel punch. We then hammered into place the Attune RP trial femoral component, inserted a  5 mm trial bearing, trial patellar button, and took the knee through range of motion from 0-130 degrees. No thumb pressure was required for patellar  Tracking. At this point, the limb was wrapped with an Esmarch bandage and the tourniquet inflated to 350 mmHg. All trial components were removed, mating surfaces irrigated with pulse lavage, and dried with suction and sponges. A double batch of DePuy HV cement with 1500 mg of Zinacef was mixed and applied to all bony metallic mating surfaces except for the posterior condyles of the femur itself. In order, we  hammered into place the tibial tray and removed excess cement, the femoral component and removed excess cement,  The 41mm  Attune RP bearing  was inserted, and the knee brought to full extension with compression.  The patellar button was clamped into place, and excess cement  removed. While the cement cured the wound was irrigated out with normal saline solution pulse lavage. Ligament stability and patellar tracking were  checked and found to be excellent. The parapatellar arthrotomy was closed with  running #1 Vicryl suture. The subcutaneous tissue with 0 and 2-0 undyed  Vicryl suture, and the skin with running 3-0 SQ vicryl. A dressing of Xeroform,  4 x 4, dressing sponges, Webril, and Ace wrap applied. The patient  awakened, extubated, and taken to recovery room without difficulty.   Traeton Bordas J 11/28/2014, 9:19 AM

## 2014-11-28 NOTE — Anesthesia Preprocedure Evaluation (Addendum)
Anesthesia Evaluation  Patient identified by MRN, date of birth, ID band Patient awake    Reviewed: Allergy & Precautions, H&P , NPO status , Patient's Chart, lab work & pertinent test results, reviewed documented beta blocker date and time   History of Anesthesia Complications Negative for: history of anesthetic complications  Airway Mallampati: II  TM Distance: >3 FB Neck ROM: full    Dental no notable dental hx.    Pulmonary neg pulmonary ROS, former smoker,    Pulmonary exam normal breath sounds clear to auscultation       Cardiovascular hypertension, Pt. on medications Normal cardiovascular exam Rhythm:regular Rate:Normal     Neuro/Psych negative neurological ROS     GI/Hepatic Neg liver ROS, GERD  ,  Endo/Other  negative endocrine ROS  Renal/GU negative Renal ROS     Musculoskeletal  (+) Arthritis ,   Abdominal   Peds  Hematology negative hematology ROS (+)   Anesthesia Other Findings   Reproductive/Obstetrics negative OB ROS                             Anesthesia Physical Anesthesia Plan  ASA: II  Anesthesia Plan: Spinal   Post-op Pain Management:    Induction: Intravenous  Airway Management Planned: Simple Face Mask  Additional Equipment:   Intra-op Plan:   Post-operative Plan:   Informed Consent: I have reviewed the patients History and Physical, chart, labs and discussed the procedure including the risks, benefits and alternatives for the proposed anesthesia with the patient or authorized representative who has indicated his/her understanding and acceptance.   Dental Advisory Given  Plan Discussed with: Anesthesiologist, CRNA and Surgeon  Anesthesia Plan Comments:        Anesthesia Quick Evaluation

## 2014-11-28 NOTE — Interval H&P Note (Signed)
History and Physical Interval Note:  11/28/2014 7:16 AM  Nathan Stewart  has presented today for surgery, with the diagnosis of LEFT KNEE OSTEOARTHRITIS LEFT KNEE  The various methods of treatment have been discussed with the patient and family. After consideration of risks, benefits and other options for treatment, the patient has consented to  Procedure(s): TOTAL KNEE ARTHROPLASTY (Left) as a surgical intervention .  The patient's history has been reviewed, patient examined, no change in status, stable for surgery.  I have reviewed the patient's chart and labs.  Questions were answered to the patient's satisfaction.     Kerin Salen

## 2014-11-28 NOTE — Plan of Care (Signed)
Problem: Consults Goal: Diagnosis- Total Joint Replacement Primary Total Knee     

## 2014-11-28 NOTE — Progress Notes (Signed)
Orthopedic Tech Progress Note Patient Details:  Nathan Stewart 19-Mar-1955 [id number]  CPM Left Knee CPM Left Knee: On Left Knee Flexion (Degrees): 40 Left Knee Extension (Degrees): 10 Additional Comments: Trapeze bar and foot roll CPM Left Shoulder CPM Left Shoulder: On   [name] 11/28/2014, 11:43 AM

## 2014-11-29 ENCOUNTER — Encounter (HOSPITAL_COMMUNITY): Payer: Self-pay | Admitting: Orthopedic Surgery

## 2014-11-29 LAB — BASIC METABOLIC PANEL
ANION GAP: 5 (ref 5–15)
BUN: 11 mg/dL (ref 6–20)
CHLORIDE: 97 mmol/L — AB (ref 101–111)
CO2: 28 mmol/L (ref 22–32)
CREATININE: 1.04 mg/dL (ref 0.61–1.24)
Calcium: 8.7 mg/dL — ABNORMAL LOW (ref 8.9–10.3)
GFR calc non Af Amer: >60 mL/min (ref >60)
Glucose, Bld: 119 mg/dL — ABNORMAL HIGH (ref 65–99)
POTASSIUM: 3.1 mmol/L — AB (ref 3.5–5.1)
SODIUM: 130 mmol/L — AB (ref 135–145)

## 2014-11-29 LAB — CBC
HEMATOCRIT: 34.1 % — AB (ref 39.0–52.0)
HEMOGLOBIN: 12.1 g/dL — AB (ref 13.0–17.0)
MCH: 31.7 pg (ref 26.0–34.0)
MCHC: 35.5 g/dL (ref 30.0–36.0)
MCV: 89.3 fL (ref 78.0–100.0)
Platelets: 166 10*3/uL (ref 150–400)
RBC: 3.82 MIL/uL — AB (ref 4.22–5.81)
RDW: 12 % (ref 11.5–15.5)
WBC: 12.5 10*3/uL — AB (ref 4.0–10.5)

## 2014-11-29 NOTE — Discharge Summary (Signed)
Patient ID: Nathan Stewart MRN: [id number] DOB/AGE: 1955-09-12 59 y.o.  Admit date: 11/28/2014 Discharge date: 11/29/2014  Admission Diagnoses:  Principal Problem:   Primary osteoarthritis of left knee Active Problems:   Arthritis of knee   Discharge Diagnoses:  Same  Past Medical History  Diagnosis Date  . TESTICULAR HYPOFUNCTION 08/14/2009  . INSOMNIA, CHRONIC 11/13/2009  . GERD 01/01/2007  . 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 GEN OSTEOARTHROSIS INVOLVING MULTIPLE SITES 11/10/2007  . LOC OSTEOARTHROS NOT SPEC PRIM/SEC LOWER LEG 11/10/2007  . OSTEOARTHRITIS 06/07/2008  . Memory loss 08/27/2007  . LIVER FUNCTION TESTS, ABNORMAL 08/27/2007  . MENISCUS TEAR 10/30/2006  . Allergy     seasonal  . Hypertension   . SLEEP APNEA 09/20/2009  . Pneumonia     Surgeries: Procedure(s): TOTAL KNEE ARTHROPLASTY on 11/28/2014   Consultants:    Discharged Condition: Improved  Hospital Course: Nathan Stewart is an 59 y.o. male who was admitted 11/28/2014 for operative treatment ofPrimary osteoarthritis of left knee. Patient has severe unremitting pain that affects sleep, daily activities, and work/hobbies. After pre-op clearance the patient was taken to the operating room on 11/28/2014 and underwent  Procedure(s): TOTAL KNEE ARTHROPLASTY.    Patient was given perioperative antibiotics: Anti-infectives    Start     Dose/Rate Route Frequency Ordered Stop   11/28/14 0813  cefUROXime (ZINACEF) injection  Status:  Discontinued       As needed 11/28/14 0813 11/28/14 0945   11/28/14 0700  ceFAZolin (ANCEF) IVPB 2 g/50 mL premix     2 g 100 mL/hr over 30 Minutes Intravenous To [facility] 11/27/14 1540 11/28/14 0745       Patient was given sequential compression devices, early ambulation, and chemoprophylaxis to prevent DVT.  Patient benefited maximally from hospital stay and there were no complications.    Recent vital signs: Patient Vitals for the past 24 hrs:  BP Temp Temp src  Pulse Resp SpO2  11/29/14 1300 135/75 mmHg 98.1 F (36.7 C) Oral 89 18 100 %  11/29/14 0736 122/65 mmHg - - - - -  11/29/14 0532 130/75 mmHg 98.4 F (36.9 C) - 78 18 98 %  11/29/14 0216 132/76 mmHg 98.5 F (36.9 C) - 76 16 100 %  11/28/14 2030 (!) 141/77 mmHg 98.2 F (36.8 C) - 76 16 97 %  11/28/14 1550 122/70 mmHg 98.2 F (36.8 C) Oral 67 - 95 %     Recent laboratory studies:  Recent Labs  11/29/14 0521  WBC 12.5*  HGB 12.1*  HCT 34.1*  PLT 166  NA 130*  K 3.1*  CL 97*  CO2 28  BUN 11  CREATININE 1.04  GLUCOSE 119*  CALCIUM 8.7*     Discharge Medications:     Medication List    STOP taking these medications        ibuprofen 800 MG tablet  Commonly known as:  ADVIL,MOTRIN      TAKE these medications        amLODipine 5 MG tablet  Commonly known as:  NORVASC  Take 1 tablet (5 mg total) by mouth daily.     aspirin EC 325 MG tablet  Take 1 tablet (325 mg total) by mouth 2 (two) times daily.     Fish Oil 1000 MG Cpdr  Take by mouth.     Glucosamine 500 MG Caps  Take 2 capsules by mouth daily.     hydrochlorothiazide 25 MG tablet  Commonly known as:  HYDRODIURIL  Take 1 tablet (25 mg total) by mouth daily.     HYDROmorphone 2 MG tablet  Commonly known as:  DILAUDID  Take 1 tablet (2 mg total) by mouth every 6 (six) hours as needed for severe pain.     methocarbamol 500 MG tablet  Commonly known as:  ROBAXIN  Take 1 tablet (500 mg total) by mouth 2 (two) times daily with a meal.     metoprolol succinate 100 MG 24 hr tablet  Commonly known as:  TOPROL-XL  Take 1 tablet (100 mg total) by mouth daily. Take with or immediately following a meal.     multivitamin tablet  Take 1 tablet by mouth daily.     testosterone cypionate 200 MG/ML injection  Commonly known as:  DEPOTESTOSTERONE CYPIONATE  Inject 1 mL (200 mg total) into the muscle every 14 (fourteen) days.     zolpidem 12.5 MG CR tablet  Commonly known as:  AMBIEN CR  Take 1 tablet (12.5 mg  total) by mouth at bedtime as needed for sleep.        Diagnostic Studies: Dg Chest 2 View  11/16/2014   CLINICAL DATA:  Preoperative evaluation for upcoming knee replacement  EXAM: CHEST - 2 VIEW  COMPARISON:  None.  FINDINGS: The heart size and mediastinal contours are within normal limits. Both lungs are clear. The visualized skeletal structures are unremarkable.  IMPRESSION: No active disease.   Electronically Signed   By: Inez Catalina M.D.   On: 11/16/2014 11:35    Disposition: Final discharge disposition not confirmed      Discharge Instructions    CPM    Complete by:  As directed   Continuous passive motion machine (CPM):      Use the CPM from 0 to 60  for 5 hours per day.      You may increase by 10 degrees per day.  You may break it up into 2 or 3 sessions per day.      Use CPM for 2 weeks or until you are told to stop.     Call MD / Call 911    Complete by:  As directed   If you experience chest pain or shortness of breath, CALL 911 and be transported to the hospital emergency room.  If you develope a fever above 101 F, pus (white drainage) or increased drainage or redness at the wound, or calf pain, call your surgeon's office.     Change dressing    Complete by:  As directed   Change dressing on day 5, then change the dressing daily with sterile 4 x 4 inch gauze dressing and apply TED hose.  You may clean the incision with alcohol prior to redressing.     Constipation Prevention    Complete by:  As directed   Drink plenty of fluids.  Prune juice may be helpful.  You may use a stool softener, such as Colace (over the counter) 100 mg twice a day.  Use MiraLax (over the counter) for constipation as needed.     Diet - low sodium heart healthy    Complete by:  As directed      Discharge instructions    Complete by:  As directed   Follow up in office with Dr. Mayer Camel in 2 weeks.     Driving restrictions    Complete by:  As directed   No driving for 2 weeks     Increase  activity slowly as tolerated  Complete by:  As directed      Patient may shower    Complete by:  As directed   You may shower without a dressing once there is no drainage.  Do not wash over the wound.  If drainage remains, cover wound with plastic wrap and then shower.           Follow-up Information    Follow up with Kerin Salen, MD In 2 weeks.   Specialty:  Orthopedic Surgery   Contact information:   [place] [address] [phone]        Signed: Theodosia Quay 11/29/2014, 3:42 PM

## 2014-11-29 NOTE — Care Management Note (Signed)
Case Management Note  Patient Details  Name: GUSTAF MCCARTER MRN: [id number] Date of Birth: [birth year]  Subjective/Objective:  59 yr old male s/p left total knee arthroplasty.                  Action/Plan:  Patient is insured through [employer]. Case manager called request for [facility]  [phone]. Orders faxed to  [phone]. Intake YI#[id number]. Someone from [facility] will contact patient with name of [facility] provider. Case manager provided this information to patient and wife. Patient states walker, 3in1 and CPM have been delivered to his home by [employer].   Expected Discharge Date:  11/30/14               Expected Discharge Plan:  Home with Home Health   In-House Referral:     Discharge planning Services     Post Acute Care Choice:    Choice offered to:     DME Arranged:   RW, 3in1 CPM DME Agency:   [employer]   HH Arranged:   in process [facility]:     Status of Service:   in process  Medicare Important Message Given:    Date Medicare IM Given:    Medicare IM give by:    Date Additional Medicare IM Given:    Additional Medicare Important Message give by:     If discussed at [place] of Stay Meetings, dates discussed:    Additional Comments:   Ninfa Meeker, RN 11/29/2014, 10:05 AM

## 2014-11-29 NOTE — Progress Notes (Signed)
Physical Therapy Treatment Patient Details Name: Nathan Stewart MRN: [id number] DOB: [birth year] Today's Date: 11/29/2014    History of Present Illness Patient is a 59 y/o male s/p left TKA. PMH includes HTN, PNA, memory loss.    PT Comments    Patient progressing with ambulation in hallway and tolerating therex becoming more independent with lifting leg with repetitions during session.  Will need follow up HHPT at d/c.  Wife present throughout session and verbalizing understanding of precautions.  Follow Up Recommendations  Home health PT;Supervision - Intermittent     Equipment Recommendations  None recommended by PT    Recommendations for Other Services       Precautions / Restrictions Precautions Precautions: Knee;Fall Precaution Comments: Reviewed no pillow under knee and precautions. Restrictions LLE Weight Bearing: Weight bearing as tolerated    Mobility  Bed Mobility Overal bed mobility: Needs Assistance       Supine to sit: Min assist     General bed mobility comments: assist for left LE  Transfers Overall transfer level: Needs assistance Equipment used: Rolling walker (2 wheeled)   Sit to Stand: Supervision         General transfer comment: stood prior to walker being close  Ambulation/Gait   Ambulation Distance (Feet): 200 Feet Assistive device: Rolling walker (2 wheeled) Gait Pattern/deviations: Decreased stride length;Antalgic;Step-to pattern;Step-through pattern     General Gait Details: cues for left knee flexion, decreased hip hike, heel strke, proximity to walker and upright posture   Stairs            Wheelchair Mobility    Modified Rankin (Stroke Patients Only)       Balance           Standing balance support: No upper extremity supported Standing balance-Leahy Scale: Fair Standing balance comment: standing prior to walking no UE support supervision                    Cognition Arousal/Alertness:  Awake/alert Behavior During Therapy: WFL for tasks assessed/performed Overall Cognitive Status: Within Functional Limits for tasks assessed                      Exercises Total Joint Exercises Ankle Circles/Pumps: Both;10 reps;Seated Quad Sets: Both;10 reps;Seated Short Arc Quad: AAROM;Left;10 reps;Seated Hip ABduction/ADduction: AROM;Left;10 reps;Seated Straight Leg Raises: AAROM;Left;10 reps;Seated Goniometric ROM: approx 3-45 with ace wrap AAROM    General Comments        Pertinent Vitals/Pain Pain Score: 7  Pain Location: left knee  Pain Descriptors / Indicators: Aching;Sore Pain Intervention(s): Monitored during session;Ice applied;Premedicated before session    Home Living                      Prior Function            PT Goals (current goals can now be found in the care plan section) Progress towards PT goals: Progressing toward goals    Frequency  7X/week    PT Plan Current plan remains appropriate    Co-evaluation             End of Session Equipment Utilized During Treatment: Gait belt Activity Tolerance: Patient tolerated treatment well Patient left: in chair;with call bell/phone within reach;with family/visitor present     Time: 1050-1120 PT Time Calculation (min) (ACUTE ONLY): 30 min  Charges:  $Gait Training: 8-22 mins $Therapeutic Exercise: 8-22 mins  G Codes:      Crecencio Kwiatek,CYNDI 11/29/2014, 11:56 AM  Magda Kiel, [place] 11/29/2014

## 2014-11-29 NOTE — Progress Notes (Addendum)
Patient ID: Nathan Stewart, male   DOB: 01-27-1956, 59 y.o.   MRN: [id number] PATIENT ID: Nathan Stewart  MRN: [id number]  DOB/AGE:  Dec 25, 1955 / 59 y.o.  1 Day Post-Op Procedure(s) (LRB): TOTAL KNEE ARTHROPLASTY (Left)    PROGRESS NOTE Subjective: Patient is alert, oriented, no Nausea, x1 Vomiting, yes passing gas, no Bowel Movement. Taking PO well. Denies SOB, Chest or Calf Pain. Using Incentive Spirometer, PAS in place. Ambulate WBAT, CPM 0-40 Patient reports pain as 3 on 0-10 scale, spinal did not wear off until yesterday PM, sloed PT.    Objective: Vital signs in last 24 hours: Filed Vitals:   11/28/14 1550 11/28/14 2030 11/29/14 0216 11/29/14 0532  BP: 122/70 141/77 132/76 130/75  Pulse: 67 76 76 78  Temp: 98.2 F (36.8 C) 98.2 F (36.8 C) 98.5 F (36.9 C) 98.4 F (36.9 C)  TempSrc: Oral     Resp:  16 16 18   Height:      Weight:      SpO2: 95% 97% 100% 98%      Intake/Output from previous day: I/O last 3 completed shifts: In: 1900 [I.V.:1800; IV Piggyback:100] Out: 1050 [Urine:800; Blood:250]   Intake/Output this shift:     LABORATORY DATA:  Recent Labs  11/29/14 0521  WBC 12.5*  HGB 12.1*  HCT 34.1*  PLT 166    Examination: Neurologically intact ABD soft Neurovascular intact Sensation intact distally Intact pulses distally Dorsiflexion/Plantar flexion intact Incision: dressing C/D/I No cellulitis present Compartment soft}  Assessment:   1 Day Post-Op Procedure(s) (LRB): TOTAL KNEE ARTHROPLASTY (Left) ADDITIONAL DIAGNOSIS: Expected Acute Blood Loss Anemia, Hypertension  Plan: PT/OT WBAT, CPM 5/hrs day until ROM 0-90 degrees, then D/C CPM DVT Prophylaxis:  SCDx72hrs, ASA 325 mg BID x 2 weeks DISCHARGE PLAN: Home, when passes PT DISCHARGE NEEDS: HHPT, CPM, Walker and 3-in-1 comode seat     Chera Slivka J 11/29/2014, 7:09 AM

## 2014-11-29 NOTE — Progress Notes (Signed)
OT Cancellation Note  Patient Details Name: Nathan Stewart MRN: [id number] DOB: [birth year]   Cancelled Treatment:    Reason Eval/Treat Not Completed: OT screened, no needs identified, will sign off  Darlina Rumpf [place], OTR/L [phone]   11/29/2014, 2:05 PM

## 2014-11-29 NOTE — Progress Notes (Signed)
Physical Therapy Treatment and Discharge Summary Patient Details Name: Nathan Stewart MRN: [id number] DOB: 1956/01/06 Today's Date: 11/29/2014    History of Present Illness Patient is a 59 y/o male s/p left TKA. PMH includes HTN, PNA, memory loss.    PT Comments    Pt was educated on stair, car transfer, walk in shower transfer and TKA precautions. Pt was given a handout for HEP and stair negotiation. The handouts were given to caregiver and both verbally stated understanding. Pt met goals and is ready for d/c to home health.  Follow Up Recommendations  Home health PT     Equipment Recommendations  None recommended by PT    Recommendations for Other Services       Precautions / Restrictions Precautions Precautions: Knee;Fall Precaution Comments: Reviewed no pillow under knee and precautions. Restrictions Weight Bearing Restrictions: Yes LLE Weight Bearing: Weight bearing as tolerated    Mobility  Bed Mobility Overal bed mobility: Modified Independent Bed Mobility: Sit to Supine     Supine to sit: Min assist Sit to supine: Modified independent (Device/Increase time)   General bed mobility comments: assist for left LE via cross leg technique  Transfers Overall transfer level: Needs assistance Equipment used: Rolling walker (2 wheeled)   Sit to Stand: Supervision Stand pivot transfers: Supervision       General transfer comment: stood prior to walker being close  Ambulation/Gait Ambulation/Gait assistance: Supervision Ambulation Distance (Feet): 50 Feet Assistive device: Rolling walker (2 wheeled) Gait Pattern/deviations: Decreased stride length;Antalgic;Step-to pattern;Step-through pattern     General Gait Details: cues for left knee flexion,  heel strke, and upright posture   Stairs Stairs: Yes Stairs assistance: Modified independent (Device/Increase time) Stair Management: Backwards;Forwards;With walker;No rails Number of Stairs: 2 General stair  comments: Patient was able to ascend and descend stairs after demonstration and verbal commands. Caregiver education and handout provided for sequencing stair and walk in shower negotiation. Wheelchair Mobility    Modified Rankin (Stroke Patients Only)       Balance Overall balance assessment: Needs assistance Sitting-balance support: No upper extremity supported Sitting balance-Leahy Scale: Good     Standing balance support: Single extremity supported Standing balance-Leahy Scale: Poor Standing balance comment: Patient required vcs to WBAT through L LE. Pt has a hx of static standing on R LE only.                    Cognition Arousal/Alertness: Awake/alert Behavior During Therapy: WFL for tasks assessed/performed Overall Cognitive Status: Within Functional Limits for tasks assessed                      Exercises Total Joint Exercises Ankle Circles/Pumps: 10 reps;Seated;Left Quad Sets: 10 reps;Seated;Left Short Arc Quad: Left;10 reps;Seated Hip ABduction/ADduction: Seated;AROM;Left;10 reps Straight Leg Raises: Seated;AAROM;Left;10 reps Goniometric ROM: approx 3-45 with ace wrap AAROM    General Comments        Pertinent Vitals/Pain Pain Score: 2  Pain Location: L knee Pain Descriptors / Indicators: Sore Pain Intervention(s): Monitored during session    Home Living                      Prior Function            PT Goals (current goals can now be found in the care plan section) Progress towards PT goals: Goals met/education completed, patient discharged from PT    Frequency  Other (Comment) (Pt to be [facility]'d to [facility])  PT Plan Current plan remains appropriate    Co-evaluation             End of Session Equipment Utilized During Treatment: Gait belt Activity Tolerance: Patient tolerated treatment well Patient left: in bed;in CPM;with call bell/phone within reach;with family/visitor present (CPM machine set to 60 degrees flexion.  Pt felt mild stretch.)     Time: 1330-1405 PT Time Calculation (min) (ACUTE ONLY): 35 min  Charges:   $Therapeutic Exercise: 8-22 mins $Therapeutic Activity: 8-22 mins                    G Codes:      WYNN,CYNDI 11-30-2014, 2:50 PM  Magda Kiel, [place] Nov 30, 2014 Above note was written in collaboration with Lyanne Co, SPT

## 2014-12-18 ENCOUNTER — Other Ambulatory Visit: Payer: Self-pay | Admitting: Family Medicine

## 2014-12-19 NOTE — Telephone Encounter (Signed)
Okay to refill? 

## 2014-12-19 NOTE — Telephone Encounter (Signed)
Ok to refill??  Last office visit/ refill 09/19/2014.

## 2014-12-20 NOTE — Telephone Encounter (Signed)
Prescription faxed

## 2015-01-10 ENCOUNTER — Other Ambulatory Visit: Payer: Self-pay | Admitting: Family Medicine

## 2015-01-10 NOTE — Telephone Encounter (Signed)
Refill appropriate and filled per protocol. 

## 2015-02-10 ENCOUNTER — Encounter: Payer: Self-pay | Admitting: Family Medicine

## 2015-02-10 ENCOUNTER — Ambulatory Visit (INDEPENDENT_AMBULATORY_CARE_PROVIDER_SITE_OTHER): Payer: Managed Care, Other (non HMO) | Admitting: Family Medicine

## 2015-02-10 ENCOUNTER — Ambulatory Visit
Admission: RE | Admit: 2015-02-10 | Discharge: 2015-02-10 | Disposition: A | Discharge status: Home / Self Care | Payer: Managed Care, Other (non HMO) | Source: Ambulatory Visit | Attending: Family Medicine | Admitting: Family Medicine

## 2015-02-10 VITALS — BP 122/76 | HR 68 | Temp 97.6°F | Resp 18 | Wt 191.0 lb

## 2015-02-10 DIAGNOSIS — R091 Pleurisy: Secondary | ICD-10-CM | POA: Diagnosis not present

## 2015-02-10 NOTE — Progress Notes (Signed)
Subjective:    Patient ID: Nathan Stewart, male    DOB: 24-Aug-1955, 59 y.o.   MRN: [id number]  HPI  Patient has a history of pneumonia. This morning he coughed and developed a sharp pain in his upper right chest that radiated into his right subscapular area. It has since improved with ibuprofen. However he also complains of pain radiating down his right arm from his elbow to his hand. He is unable to describe the abnormal sensation that he denies any numbness or weakness in the right arm. He denies any numbness and tingling. He does complain of a generalized ache from his elbow down to his hand. He has a negative Spurling sign. He has normal reflexes at the biceps as well as the brachioradialis. He has normal muscle strength in both upper extremities. He does report an occasional cough and he is concerned could be pneumonia again. He denies any fevers or chills or hemoptysis. He denies any shortness of breath Past Medical History  Diagnosis Date  . TESTICULAR HYPOFUNCTION 08/14/2009  . INSOMNIA, CHRONIC 11/13/2009  . GERD 01/01/2007  . 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 GEN OSTEOARTHROSIS INVOLVING MULTIPLE SITES 11/10/2007  . LOC OSTEOARTHROS NOT SPEC PRIM/SEC LOWER LEG 11/10/2007  . OSTEOARTHRITIS 06/07/2008  . Memory loss 08/27/2007  . LIVER FUNCTION TESTS, ABNORMAL 08/27/2007  . MENISCUS TEAR 10/30/2006  . Allergy     seasonal  . Hypertension   . SLEEP APNEA 09/20/2009  . Pneumonia    Past Surgical History  Procedure Laterality Date  . Hand surgery  over 20 years ago    'metal plate in wrist'  . Multiple tooth extractions    . Colonoscopy    . Total knee arthroplasty Left 11/28/2014    Procedure: TOTAL KNEE ARTHROPLASTY;  Surgeon: Frederik Pear, MD;  Location: [facility];  Service: Orthopedics;  Laterality: Left;   Current Outpatient Prescriptions on File Prior to Visit  Medication Sig Dispense Refill  . amLODipine (NORVASC) 5 MG tablet TAKE 1 TABLET EVERY DAY 90 tablet 2  . aspirin EC  325 MG tablet Take 1 tablet (325 mg total) by mouth 2 (two) times daily. 30 tablet 0  . Glucosamine 500 MG CAPS Take 2 capsules by mouth daily.     . hydrochlorothiazide (HYDRODIURIL) 25 MG tablet Take 1 tablet (25 mg total) by mouth daily. 90 tablet 2  . metoprolol succinate (TOPROL-XL) 100 MG 24 hr tablet Take 1 tablet (100 mg total) by mouth daily. Take with or immediately following a meal. 90 tablet 3  . Multiple Vitamin (MULTIVITAMIN) tablet Take 1 tablet by mouth daily.    . Omega-3 Fatty Acids (FISH OIL) 1000 MG CPDR Take by mouth.    . testosterone cypionate (DEPOTESTOSTERONE CYPIONATE) 200 MG/ML injection INJECT 1 ML IN MUSCLE EVERY 14 DAYS 10 mL 0  . zolpidem (AMBIEN CR) 12.5 MG CR tablet Take 1 tablet (12.5 mg total) by mouth at bedtime as needed for sleep. 30 tablet 3  . HYDROmorphone (DILAUDID) 2 MG tablet Take 1 tablet (2 mg total) by mouth every 6 (six) hours as needed for severe pain. (Patient not taking: Reported on 02/10/2015) 60 tablet 0  . methocarbamol (ROBAXIN) 500 MG tablet Take 1 tablet (500 mg total) by mouth 2 (two) times daily with a meal. (Patient not taking: Reported on 02/10/2015) 60 tablet 0   No current facility-administered medications on file prior to visit.   Allergies  Allergen Reactions  . Irbesartan  Tingling, swelling  . Percocet [Oxycodone-Acetaminophen] Nausea And Vomiting   Social History   Social History  . Marital Status: Married    Spouse Name: N/A  . Number of Children: N/A  . Years of Education: N/A   Occupational History  . Not on file.   Social History Main Topics  . Smoking status: Former Smoker    Quit date: 02/25/1997  . Smokeless tobacco: Never Used     Comment: quit 1999  . Alcohol Use: Yes     Comment: occasional  . Drug Use: No  . Sexual Activity: Yes   Other Topics Concern  . Not on file   Social History Narrative     Review of Systems  All other systems reviewed and are negative.      Objective:    Physical Exam  Constitutional: He is oriented to person, place, and time. He appears well-developed and well-nourished.  Cardiovascular: Normal rate, regular rhythm and normal heart sounds.   No murmur heard. Pulmonary/Chest: Effort normal and breath sounds normal. No respiratory distress. He has no wheezes. He has no rales.  Abdominal: Soft. Bowel sounds are normal.  Musculoskeletal: Normal range of motion. He exhibits no tenderness.  Neurological: He is alert and oriented to person, place, and time. He has normal reflexes. He displays normal reflexes. No cranial nerve deficit. He exhibits normal muscle tone. Coordination normal.  Vitals reviewed.         Assessment & Plan:  Pleurisy - Plan: DG Chest 2 View  I will obtain a chest x-ray given the pleurisy he experienced this morning. However I believe that he may pull the muscle in his chest. I recommended ibuprofen 800 mg every 8 hours as needed, assuming that the chest x-ray is clear. Other possible causes on the differential diagnosis would be cervical degenerative disc disease with right-sided cervical radiculopathy. Again I believe a trial of ibuprofen is adequate assuming the chest x-ray is normal. Seek medical attention immediately if symptoms change

## 2015-03-19 ENCOUNTER — Other Ambulatory Visit: Payer: Self-pay | Admitting: Family Medicine

## 2015-03-20 NOTE — Telephone Encounter (Signed)
Medication called to pharmacy. 

## 2015-03-20 NOTE — Telephone Encounter (Signed)
Okay to refill? 

## 2015-03-20 NOTE — Telephone Encounter (Signed)
Ok to refill??  Last office visit 02/10/2015.  Last refill 09/19/2014., #2 refills.

## 2015-06-05 ENCOUNTER — Other Ambulatory Visit: Payer: Self-pay | Admitting: Family Medicine

## 2015-06-06 NOTE — Telephone Encounter (Signed)
Medication called to pharmacy. 

## 2015-06-06 NOTE — Telephone Encounter (Signed)
Ok to refill??  Last office visit 02/10/2015.  Last refill 12/20/2014.

## 2015-06-06 NOTE — Telephone Encounter (Signed)
Okay to refill? 

## 2015-06-16 ENCOUNTER — Other Ambulatory Visit: Payer: Self-pay | Admitting: Family Medicine

## 2015-06-16 ENCOUNTER — Other Ambulatory Visit: Payer: Managed Care, Other (non HMO)

## 2015-06-16 DIAGNOSIS — Z125 Encounter for screening for malignant neoplasm of prostate: Secondary | ICD-10-CM

## 2015-06-16 DIAGNOSIS — Z79899 Other long term (current) drug therapy: Secondary | ICD-10-CM

## 2015-06-16 DIAGNOSIS — E291 Testicular hypofunction: Secondary | ICD-10-CM

## 2015-06-16 DIAGNOSIS — Z0000 Encounter for general adult medical examination without abnormal findings: Secondary | ICD-10-CM

## 2015-06-16 DIAGNOSIS — I10 Essential (primary) hypertension: Secondary | ICD-10-CM

## 2015-06-16 LAB — COMPLETE METABOLIC PANEL WITH GFR
ALT: 30 U/L (ref 9–46)
AST: 23 U/L (ref 10–35)
Albumin: 4.3 g/dL (ref 3.6–5.1)
Alkaline Phosphatase: 49 U/L (ref 40–115)
BUN: 13 mg/dL (ref 7–25)
CALCIUM: 9.3 mg/dL (ref 8.6–10.3)
CHLORIDE: 103 mmol/L (ref 98–110)
CO2: 24 mmol/L (ref 20–31)
Creat: 0.98 mg/dL (ref 0.70–1.25)
GFR, EST NON AFRICAN AMERICAN: 83 mL/min (ref >=60)
Glucose, Bld: 101 mg/dL — ABNORMAL HIGH (ref 70–99)
POTASSIUM: 3.6 mmol/L (ref 3.5–5.3)
Sodium: 138 mmol/L (ref 135–146)
Total Bilirubin: 1.1 mg/dL (ref 0.2–1.2)
Total Protein: 6.9 g/dL (ref 6.1–8.1)

## 2015-06-16 LAB — TSH: TSH: 1.53 m[IU]/L (ref 0.40–4.50)

## 2015-06-16 LAB — CBC WITH DIFFERENTIAL/PLATELET
BASOS ABS: 0 {cells}/uL (ref 0–200)
BASOS PCT: 0 %
Eosinophils Absolute: 219 cells/uL (ref 15–500)
Eosinophils Relative: 3 %
HEMATOCRIT: 47.8 % (ref 38.5–50.0)
Hemoglobin: 16.3 g/dL (ref 13.0–17.0)
LYMPHS PCT: 20 %
Lymphs Abs: 1460 cells/uL (ref 850–3900)
MCH: 31.2 pg (ref 27.0–33.0)
MCHC: 34.1 g/dL (ref 32.0–36.0)
MCV: 91.4 fL (ref 80.0–100.0)
MONO ABS: 511 {cells}/uL (ref 200–950)
MPV: 10.5 fL (ref 7.5–12.5)
Monocytes Relative: 7 %
Neutro Abs: 5110 cells/uL (ref 1500–7800)
Neutrophils Relative %: 70 %
Platelets: 204 10*3/uL (ref 140–400)
RBC: 5.23 MIL/uL (ref 4.20–5.80)
RDW: 13 % (ref 11.0–15.0)
WBC: 7.3 10*3/uL (ref 3.8–10.8)

## 2015-06-16 LAB — LIPID PANEL
CHOL/HDL RATIO: 3.3 ratio (ref <=5.0)
CHOLESTEROL: 141 mg/dL (ref 125–200)
HDL: 43 mg/dL (ref >=40)
LDL Cholesterol: 80 mg/dL (ref <130)
TRIGLYCERIDES: 92 mg/dL (ref <150)
VLDL: 18 mg/dL (ref <30)

## 2015-06-16 LAB — TESTOSTERONE: TESTOSTERONE: 196 ng/dL — AB (ref 250–827)

## 2015-06-17 LAB — PSA: PSA: 0.84 ng/mL (ref <=4.00)

## 2015-06-23 ENCOUNTER — Ambulatory Visit (INDEPENDENT_AMBULATORY_CARE_PROVIDER_SITE_OTHER): Payer: Managed Care, Other (non HMO) | Admitting: Family Medicine

## 2015-06-23 ENCOUNTER — Encounter: Payer: Self-pay | Admitting: Family Medicine

## 2015-06-23 ENCOUNTER — Telehealth: Payer: Self-pay | Admitting: *Deleted

## 2015-06-23 VITALS — BP 130/82 | HR 76 | Temp 97.8°F | Resp 14 | Ht 74.0 in | Wt 190.0 lb

## 2015-06-23 DIAGNOSIS — Z23 Encounter for immunization: Secondary | ICD-10-CM

## 2015-06-23 DIAGNOSIS — I10 Essential (primary) hypertension: Secondary | ICD-10-CM

## 2015-06-23 DIAGNOSIS — Z0000 Encounter for general adult medical examination without abnormal findings: Secondary | ICD-10-CM

## 2015-06-23 DIAGNOSIS — E291 Testicular hypofunction: Secondary | ICD-10-CM | POA: Diagnosis not present

## 2015-06-23 DIAGNOSIS — R7989 Other specified abnormal findings of blood chemistry: Secondary | ICD-10-CM

## 2015-06-23 DIAGNOSIS — L299 Pruritus, unspecified: Secondary | ICD-10-CM

## 2015-06-23 DIAGNOSIS — G4700 Insomnia, unspecified: Secondary | ICD-10-CM | POA: Diagnosis not present

## 2015-06-23 MED ORDER — DIPHENHYDRAMINE HCL 50 MG/ML IJ SOLN: 50.0000 mg | Freq: Once | INTRAMUSCULAR | Status: DC

## 2015-06-23 MED ORDER — DIPHENHYDRAMINE HCL 50 MG/ML IJ SOLN
25.0000 mg | Freq: Once | INTRAMUSCULAR | Status: AC
  Administered 2015-06-23: 25 mg via INTRAMUSCULAR

## 2015-06-23 MED ORDER — ZOLPIDEM TARTRATE ER 12.5 MG PO TBCR: 12.5000 mg | EXTENDED_RELEASE_TABLET | Freq: Every evening | ORAL | Status: DC | PRN

## 2015-06-23 NOTE — Telephone Encounter (Signed)
noted 

## 2015-06-23 NOTE — Patient Instructions (Addendum)
Use the robaxin as needed  Referral to Urology - evening appointment  SHingles vaccine  F/U 6 months

## 2015-06-23 NOTE — Telephone Encounter (Signed)
Received call from patient.   Reports that he reviewed testosterone injection log and noted that he was mistaken when discussing lab results with MD.   Patient had labs drawn on 06/16/2015. He injected testosterone on 4/23/207.  MD to be made aware.

## 2015-06-23 NOTE — Progress Notes (Signed)
Patient ID: [name], male   DOB: 04/27/1955, 60 y.o.   MRN: [id number]    Subjective:    Patient ID: Nathan Stewart, male    DOB: Sep 08, 1955, 60 y.o.   MRN: [id number]  Patient presents for CPE  Agent here for complete physical exam. His fasting labs reviewed at the bedside. His medications were reviewed. Colonoscopy is due in December of this year. Dr. Carlean Purl He is due for shingles vaccine Since her last visit he has had arthroplasty of his left knee back in October 2016.  Things have problems with decreased libido as well as fatigue despite giving his testosterone injections every 2 weeks he is at the maximum dose. It seems as soon as his testosterone starts to wear off towards the end of the 2 weeks he is in a severe fatigue slump. His PSA is normal.  Review Of Systems:  GEN- + fatigue, fever, weight loss,weakness, recent illness HEENT- denies eye drainage, change in vision, nasal discharge, CVS- denies chest pain, palpitations RESP- denies SOB, cough, wheeze ABD- denies N/V, change in stools, abd pain GU- denies dysuria, hematuria, dribbling, incontinence MSK- + joint pain, muscle aches, injury Neuro- denies headache, dizziness, syncope, seizure activity       Objective:    BP 130/82 mmHg  Pulse 76  Temp(Src) 97.8 F (36.6 C) (Oral)  Resp 14  Ht 6\' 2"  (1.88 m)  Wt 190 lb (86.183 kg)  BMI 24.38 kg/m2 GEN- NAD, alert and oriented x3 HEENT- PERRL, EOMI, non injected sclera, pink conjunctiva, MMM, oropharynx clear Neck- Supple, no thyromegaly CVS- RRR, no murmur RESP-CTAB ABD-NABS,soft,NT,ND Rectum- Deferred  EXT- No edema Pulses- Radial, DP- 2+        Assessment & Plan:    After the shingles vaccine patient again having some itching and redness at the injection site. He was given Benadryl 25 mg and this resolved his symptoms.   Problem List Items Addressed This Visit    Low testosterone - Primary    Referral to Urology, despite injections continued  fluctuations in hormone and symptoms not controlled Note this lab was 3 days prior to his injection      Relevant Orders   Ambulatory referral to Urology   INSOMNIA, CHRONIC    Continue ambien XR      Essential hypertension    Well controlled, no change to meds       Other Visit Diagnoses    Need for prophylactic vaccination and inoculation against single disease        Itching        Relevant Medications    diphenhydrAMINE (BENADRYL) injection 25 mg (Completed)    Routine general medical examination at a health care facility        CPE done,shingles vaccine given, discussed labs and meds       Note: This dictation was prepared with Dragon dictation along with smaller phrase technology. Any transcriptional errors that result from this process are unintentional.

## 2015-06-25 ENCOUNTER — Other Ambulatory Visit: Payer: Self-pay | Admitting: Family Medicine

## 2015-06-26 NOTE — Telephone Encounter (Signed)
Refill appropriate and filled per protocol. 

## 2015-06-27 NOTE — Assessment & Plan Note (Signed)
Well controlled, no change to meds 

## 2015-06-27 NOTE — Assessment & Plan Note (Signed)
Continue ambien XR

## 2015-06-27 NOTE — Assessment & Plan Note (Signed)
Referral to Urology, despite injections continued fluctuations in hormone and symptoms not controlled Note this lab was 3 days prior to his injection

## 2015-07-17 ENCOUNTER — Telehealth: Payer: Self-pay | Admitting: Family Medicine

## 2015-07-17 NOTE — Telephone Encounter (Signed)
Referral was placed to [facility]. They are calling to request all past testosterone labs on this patient per physician request. Please fax to [phone]

## 2015-07-18 NOTE — Telephone Encounter (Signed)
Results were faxed. 

## 2015-09-11 LAB — HM DIABETES EYE EXAM

## 2015-09-23 ENCOUNTER — Other Ambulatory Visit: Payer: Self-pay | Admitting: Family Medicine

## 2015-09-25 NOTE — Telephone Encounter (Signed)
Medication called to pharmacy. 

## 2015-09-25 NOTE — Telephone Encounter (Signed)
okay

## 2015-09-25 NOTE — Telephone Encounter (Signed)
Ok to refill??  Last office visit/ refill 06/23/2015, #2 refills.

## 2015-09-28 ENCOUNTER — Encounter: Payer: Self-pay | Admitting: Family Medicine

## 2015-09-28 DIAGNOSIS — H35033 Hypertensive retinopathy, bilateral: Secondary | ICD-10-CM | POA: Insufficient documentation

## 2015-09-28 DIAGNOSIS — H269 Unspecified cataract: Secondary | ICD-10-CM | POA: Insufficient documentation

## 2015-09-28 DIAGNOSIS — H409 Unspecified glaucoma: Secondary | ICD-10-CM | POA: Insufficient documentation

## 2015-10-13 ENCOUNTER — Encounter: Payer: Self-pay | Admitting: Family Medicine

## 2015-12-14 ENCOUNTER — Other Ambulatory Visit: Payer: Self-pay | Admitting: Family Medicine

## 2015-12-22 ENCOUNTER — Other Ambulatory Visit: Payer: Self-pay | Admitting: Family Medicine

## 2015-12-22 NOTE — Telephone Encounter (Signed)
okay

## 2015-12-22 NOTE — Telephone Encounter (Signed)
Medication called to pharmacy. 

## 2015-12-22 NOTE — Telephone Encounter (Signed)
Ok to refill??  Last office visit 06/23/2015.  Last refill 09/25/2015, #2 refills.

## 2015-12-25 ENCOUNTER — Ambulatory Visit: Payer: Managed Care, Other (non HMO) | Admitting: Family Medicine

## 2016-01-01 ENCOUNTER — Encounter: Payer: Self-pay | Admitting: Podiatry

## 2016-01-01 ENCOUNTER — Ambulatory Visit (INDEPENDENT_AMBULATORY_CARE_PROVIDER_SITE_OTHER): Payer: Managed Care, Other (non HMO) | Admitting: Podiatry

## 2016-01-01 VITALS — BP 120/71 | HR 87 | Resp 16 | Ht 74.0 in | Wt 185.0 lb

## 2016-01-01 DIAGNOSIS — M2042 Other hammer toe(s) (acquired), left foot: Secondary | ICD-10-CM

## 2016-01-01 DIAGNOSIS — Q828 Other specified congenital malformations of skin: Secondary | ICD-10-CM

## 2016-01-01 DIAGNOSIS — M779 Enthesopathy, unspecified: Secondary | ICD-10-CM

## 2016-01-01 NOTE — Progress Notes (Signed)
   Subjective:    Patient ID: Nathan Stewart, male    DOB: 04/03/1955, 60 y.o.   MRN: [id number]  HPI Chief Complaint  Patient presents with  . Painful lesion    Left foot; plantar forefoot-below 3rd toe; x2 months      Review of Systems  Musculoskeletal: Positive for arthralgias.  All other systems reviewed and are negative.      Objective:   Physical Exam        Assessment & Plan:

## 2016-01-01 NOTE — Progress Notes (Signed)
Subjective:     Patient ID: Nathan Stewart, male   DOB: 1955-03-16, 60 y.o.   MRN: [id number]  HPI patient states he has a lot of pain under his left foot that makes it hard to walk and also has had significant digital deformities left over right for period of time of years. Has had history of this problem   Review of Systems  All other systems reviewed and are negative.      Objective:   Physical Exam  Constitutional: He is oriented to person, place, and time.  Cardiovascular: Intact distal pulses.   Musculoskeletal: Normal range of motion.  Neurological: He is oriented to person, place, and time.  Skin: Skin is warm.  Nursing note and vitals reviewed.  neurovascular status intact muscle strength adequate range of motion within normal limits with patient found to have exquisite discomfort underneath the left second metatarsal with keratotic lesion that's painful when pressed and structural deformity of the lesser digits that cause retrograde pressure down on the joint surface. 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 Today I went ahead and I debrided lesion and scanned for custom orthotics to reduce joint pressure and explained possibility for digital fusion at one point in the future or other structural correction depending on response

## 2016-01-05 ENCOUNTER — Encounter: Payer: Self-pay | Admitting: Family Medicine

## 2016-01-05 ENCOUNTER — Ambulatory Visit (INDEPENDENT_AMBULATORY_CARE_PROVIDER_SITE_OTHER): Payer: Managed Care, Other (non HMO) | Admitting: Family Medicine

## 2016-01-05 VITALS — BP 132/70 | HR 80 | Temp 98.2°F | Resp 14 | Ht 74.0 in | Wt 188.0 lb

## 2016-01-05 DIAGNOSIS — I10 Essential (primary) hypertension: Secondary | ICD-10-CM

## 2016-01-05 DIAGNOSIS — G4700 Insomnia, unspecified: Secondary | ICD-10-CM | POA: Diagnosis not present

## 2016-01-05 DIAGNOSIS — R7989 Other specified abnormal findings of blood chemistry: Secondary | ICD-10-CM

## 2016-01-05 DIAGNOSIS — E349 Endocrine disorder, unspecified: Secondary | ICD-10-CM | POA: Diagnosis not present

## 2016-01-05 DIAGNOSIS — Z23 Encounter for immunization: Secondary | ICD-10-CM

## 2016-01-05 DIAGNOSIS — Z1211 Encounter for screening for malignant neoplasm of colon: Secondary | ICD-10-CM

## 2016-01-05 LAB — COMPREHENSIVE METABOLIC PANEL
ALK PHOS: 46 U/L (ref 40–115)
ALT: 29 U/L (ref 9–46)
AST: 22 U/L (ref 10–35)
Albumin: 4.6 g/dL (ref 3.6–5.1)
BUN: 15 mg/dL (ref 7–25)
CALCIUM: 9.5 mg/dL (ref 8.6–10.3)
CHLORIDE: 102 mmol/L (ref 98–110)
CO2: 24 mmol/L (ref 20–31)
Creat: 1.02 mg/dL (ref 0.70–1.25)
GLUCOSE: 103 mg/dL — AB (ref 70–99)
POTASSIUM: 3.7 mmol/L (ref 3.5–5.3)
Sodium: 136 mmol/L (ref 135–146)
TOTAL PROTEIN: 6.9 g/dL (ref 6.1–8.1)
Total Bilirubin: 0.9 mg/dL (ref 0.2–1.2)

## 2016-01-05 NOTE — Assessment & Plan Note (Signed)
Blood pressure well controlled to change medication. Metabolic panel today. Lipid is at goal.

## 2016-01-05 NOTE — Progress Notes (Signed)
   Subjective:    Patient ID: Nathan Stewart, male    DOB: 07/13/1955, 60 y.o.   MRN: [id number]  Patient presents for 6 month F/U (is fasting) Patient here to follow-up chronic medical problems. His medications were reviewed. He was seen by urology for his low testosterone he is on injections, they increased him to 300mg  every 2 weeks, but this did not help, no he resumed his regular 200mg  and is no longer following with them  Hypertension he said his blood pressure medicine as prescribed currently on hydrochlorothiazide, metoprolol and amlodipine  Chronic insomnia maintained on Ambien extended release   Review Of Systems:  GEN- denies fatigue, fever, weight loss,weakness, recent illness HEENT- denies eye drainage, change in vision, nasal discharge, CVS- denies chest pain, palpitations RESP- denies SOB, cough, wheeze ABD- denies N/V, change in stools, abd pain GU- denies dysuria, hematuria, dribbling, incontinence MSK- denies joint pain, muscle aches, injury Neuro- denies headache, dizziness, syncope, seizure activity       Objective:    BP 132/70 (BP Location: Left Arm, Patient Position: Sitting, Cuff Size: Normal)   Pulse 80   Temp 98.2 F (36.8 C) (Oral)   Resp 14   Ht 6\' 2"  (1.88 m)   Wt 188 lb (85.3 kg)   SpO2 98%   BMI 24.14 kg/m  GEN- NAD, alert and oriented x3 HEENT- PERRL, EOMI, non injected sclera, pink conjunctiva, MMM, oropharynx clear CVS- RRR, no murmur RESP-CTAB EXT- No edema Pulses- Radial 2+        Assessment & Plan:      Problem List Items Addressed This Visit    Low testosterone    Continue testosterone injections. I will check a CBC as been 6 months      Relevant Orders   CBC with Differential/Platelet   INSOMNIA, CHRONIC    Continue Ambien we discussed trying a alternative medication as he is dependent on this but he does not want to change at this time. I will consider Belsomra or Doxepin      Essential hypertension - Primary   Blood pressure well controlled to change medication. Metabolic panel today. Lipid is at goal.      Relevant Orders   CBC with Differential/Platelet   Comprehensive metabolic panel    Other Visit Diagnoses    Colon cancer screening       Relevant Orders   Ambulatory referral to Gastroenterology   Need for prophylactic vaccination and inoculation against influenza       Relevant Orders   Flu Vaccine QUAD 36+ mos PF IM (Fluarix & Fluzone Quad PF) (Completed)      Note: This dictation was prepared with Dragon dictation along with smaller phrase technology. Any transcriptional errors that result from this process are unintentional.

## 2016-01-05 NOTE — Assessment & Plan Note (Signed)
Continue testosterone injections. I will check a CBC as been 6 months

## 2016-01-05 NOTE — Patient Instructions (Addendum)
F/U End of April

## 2016-01-05 NOTE — Assessment & Plan Note (Signed)
Continue Ambien we discussed trying a alternative medication as he is dependent on this but he does not want to change at this time. I will consider Belsomra or Doxepin

## 2016-01-06 LAB — CBC WITH DIFFERENTIAL/PLATELET
BASOS ABS: 0 {cells}/uL (ref 0–200)
BASOS PCT: 0 %
EOS ABS: 258 {cells}/uL (ref 15–500)
Eosinophils Relative: 3 %
HEMATOCRIT: 51.6 % — AB (ref 38.5–50.0)
Hemoglobin: 17.5 g/dL — ABNORMAL HIGH (ref 13.0–17.0)
LYMPHS PCT: 24 %
Lymphs Abs: 2064 cells/uL (ref 850–3900)
MCH: 32.2 pg (ref 27.0–33.0)
MCHC: 33.9 g/dL (ref 32.0–36.0)
MCV: 94.4 fL (ref 80.0–100.0)
MONO ABS: 516 {cells}/uL (ref 200–950)
MONOS PCT: 6 %
MPV: 10.7 fL (ref 7.5–12.5)
NEUTROS PCT: 67 %
Neutro Abs: 5762 cells/uL (ref 1500–7800)
PLATELETS: 168 10*3/uL (ref 140–400)
RBC: 5.44 MIL/uL (ref 4.20–5.80)
RDW: 12.9 % (ref 11.0–15.0)
WBC: 8.6 10*3/uL (ref 3.8–10.8)

## 2016-01-09 ENCOUNTER — Encounter: Payer: Self-pay | Admitting: Internal Medicine

## 2016-01-11 ENCOUNTER — Other Ambulatory Visit: Payer: Self-pay | Admitting: *Deleted

## 2016-01-11 DIAGNOSIS — D582 Other hemoglobinopathies: Secondary | ICD-10-CM

## 2016-01-13 ENCOUNTER — Other Ambulatory Visit: Payer: Self-pay | Admitting: Family Medicine

## 2016-02-06 ENCOUNTER — Encounter: Payer: Self-pay | Admitting: Internal Medicine

## 2016-02-09 ENCOUNTER — Ambulatory Visit: Payer: Managed Care, Other (non HMO)

## 2016-02-09 DIAGNOSIS — D582 Other hemoglobinopathies: Secondary | ICD-10-CM

## 2016-02-09 LAB — CBC WITH DIFFERENTIAL/PLATELET
BASOS ABS: 0 {cells}/uL (ref 0–200)
BASOS PCT: 0 %
EOS ABS: 186 {cells}/uL (ref 15–500)
EOS PCT: 3 %
HCT: 49.4 % (ref 38.5–50.0)
HEMOGLOBIN: 16.7 g/dL (ref 13.0–17.0)
LYMPHS ABS: 1736 {cells}/uL (ref 850–3900)
Lymphocytes Relative: 28 %
MCH: 31.6 pg (ref 27.0–33.0)
MCHC: 33.8 g/dL (ref 32.0–36.0)
MCV: 93.6 fL (ref 80.0–100.0)
MONOS PCT: 7 %
MPV: 10.3 fL (ref 7.5–12.5)
Monocytes Absolute: 434 cells/uL (ref 200–950)
NEUTROS ABS: 3844 {cells}/uL (ref 1500–7800)
Neutrophils Relative %: 62 %
PLATELETS: 207 10*3/uL (ref 140–400)
RBC: 5.28 MIL/uL (ref 4.20–5.80)
RDW: 12.7 % (ref 11.0–15.0)
WBC: 6.2 10*3/uL (ref 3.8–10.8)

## 2016-03-22 ENCOUNTER — Ambulatory Visit (AMBULATORY_SURGERY_CENTER): Payer: Self-pay

## 2016-03-22 ENCOUNTER — Encounter: Payer: Self-pay | Admitting: Internal Medicine

## 2016-03-22 VITALS — Ht 73.5 in | Wt 193.4 lb

## 2016-03-22 DIAGNOSIS — Z1211 Encounter for screening for malignant neoplasm of colon: Secondary | ICD-10-CM

## 2016-03-22 NOTE — Progress Notes (Signed)
No allergies to eggs or soy No diet meds No home oxygen No past problems with anesthesia  No email

## 2016-03-28 ENCOUNTER — Encounter: Payer: Self-pay | Admitting: Internal Medicine

## 2016-03-28 ENCOUNTER — Ambulatory Visit (AMBULATORY_SURGERY_CENTER): Payer: Managed Care, Other (non HMO) | Admitting: Internal Medicine

## 2016-03-28 VITALS — BP 126/77 | HR 61 | Temp 97.8°F | Resp 20 | Ht 73.5 in | Wt 193.0 lb

## 2016-03-28 DIAGNOSIS — D123 Benign neoplasm of transverse colon: Secondary | ICD-10-CM

## 2016-03-28 DIAGNOSIS — Z1211 Encounter for screening for malignant neoplasm of colon: Secondary | ICD-10-CM

## 2016-03-28 DIAGNOSIS — Z1212 Encounter for screening for malignant neoplasm of rectum: Secondary | ICD-10-CM | POA: Diagnosis not present

## 2016-03-28 MED ORDER — SODIUM CHLORIDE 0.9 % IV SOLN: 500.0000 mL | INTRAVENOUS | Status: DC

## 2016-03-28 NOTE — Patient Instructions (Addendum)
   I found and removed one small polyp that looks benign (not cancer).  You also have a condition called diverticulosis - common and not usually a problem. Please read the handout provided.  I appreciate the opportunity to care for you. Gatha Mayer, MD, FACG  YOU HAD AN ENDOSCOPIC PROCEDURE TODAY AT [facility]:   Refer to the procedure report that was given to you for any specific questions about what was found during the examination.  If the procedure report does not answer your questions, please call your gastroenterologist to clarify.  If you requested that your care partner not be given the details of your procedure findings, then the procedure report has been included in a sealed envelope for you to review at your convenience later.  YOU SHOULD EXPECT: Some feelings of bloating in the abdomen. Passage of more gas than usual.  Walking can help get rid of the air that was put into your GI tract during the procedure and reduce the bloating. If you had a lower endoscopy (such as a colonoscopy or flexible sigmoidoscopy) you may notice spotting of blood in your stool or on the toilet paper. If you underwent a bowel prep for your procedure, you may not have a normal bowel movement for a few days.  Please Note:  You might notice some irritation and congestion in your nose or some drainage.  This is from the oxygen used during your procedure.  There is no need for concern and it should clear up in a day or so.  SYMPTOMS TO REPORT IMMEDIATELY:   Following lower endoscopy (colonoscopy or flexible sigmoidoscopy):  Excessive amounts of blood in the stool  Significant tenderness or worsening of abdominal pains  Swelling of the abdomen that is new, acute  Fever of 100F or higher   For urgent or emergent issues, a gastroenterologist can be reached at any hour by calling [phone].   DIET:  We do recommend a small meal at first, but then you may proceed to your regular  diet.  Drink plenty of fluids but you should avoid alcoholic beverages for 24 hours.  ACTIVITY:  You should plan to take it easy for the rest of today and you should NOT DRIVE or use heavy machinery until tomorrow (because of the sedation medicines used during the test).    FOLLOW UP: Our staff will call the number listed on your records the next business day following your procedure to check on you and address any questions or concerns that you may have regarding the information given to you following your procedure. If we do not reach you, we will leave a message.  However, if you are feeling well and you are not experiencing any problems, there is no need to return our call.  We will assume that you have returned to your regular daily activities without incident.  If any biopsies were taken you will be contacted by phone or by letter within the next 1-3 weeks.  Please call us at [phone] if you have not heard about the biopsies in 3 weeks.    SIGNATURES/CONFIDENTIALITY: You and/or your care partner have signed paperwork which will be entered into your electronic medical record.  These signatures attest to the fact that that the information above on your After Visit Summary has been reviewed and is understood.  Full responsibility of the confidentiality of this discharge information lies with you and/or your care-partner.

## 2016-03-28 NOTE — Op Note (Signed)
[facility] Patient Name: Nathan Stewart Procedure Date: 03/28/2016 8:33 AM MRN: [id number] Endoscopist: Gatha Mayer , MD Age: 61 Referring MD:  Date of Birth: 06-28-1955 Gender: Male Account #: [id number] Procedure:                Colonoscopy Indications:              Screening for colorectal malignant neoplasm, Last                            colonoscopy: 2007 Medicines:                Monitored Anesthesia Care Procedure:                Pre-Anesthesia Assessment:                           - Prior to the procedure, a History and Physical                            was performed, and patient medications and                            allergies were reviewed. The patient's tolerance of                            previous anesthesia was also reviewed. The risks                            and benefits of the procedure and the sedation                            options and risks were discussed with the patient.                            All questions were answered, and informed consent                            was obtained. Prior Anticoagulants: The patient has                            taken no previous anticoagulant or antiplatelet                            agents. ASA Grade Assessment: II - A patient with                            mild systemic disease. After reviewing the risks                            and benefits, the patient was deemed in                            satisfactory condition to undergo the procedure.  After obtaining informed consent, the colonoscope                            was passed under direct vision. Throughout the                            procedure, the patient's blood pressure, pulse, and                            oxygen saturations were monitored continuously. The                            Model CF-HQ190L [id number]) scope was introduced                            through the anus and advanced to the the  cecum,                            identified by appendiceal orifice and ileocecal                            valve. The colonoscopy was performed without                            difficulty. The patient tolerated the procedure                            well. The quality of the bowel preparation was                            excellent. The bowel preparation used was Miralax.                            The ileocecal valve, appendiceal orifice, and                            rectum were photographed. Scope In: 8:39:40 AM Scope Out: 8:58:13 AM Scope Withdrawal Time: 0 hours 11 minutes 0 seconds  Total Procedure Duration: 0 hours 18 minutes 33 seconds  Findings:                 The perianal and digital rectal examinations were                            normal. Pertinent negatives include normal prostate                            (size, shape, and consistency).                           A 3 mm polyp was found in the transverse colon. The                            polyp was sessile. The polyp was removed with a  cold snare. Resection and retrieval were complete.                            Verification of patient identification for the                            specimen was done. Estimated blood loss was minimal.                           Multiple small and large-mouthed diverticula were                            found in the sigmoid colon.                           The exam was otherwise without abnormality on                            direct and retroflexion views. Complications:            No immediate complications. Estimated Blood Loss:     Estimated blood loss was minimal. Impression:               - One 3 mm polyp in the transverse colon, removed                            with a cold snare. Resected and retrieved.                           - Diverticulosis in the sigmoid colon.                           - The examination was otherwise normal on direct                             and retroflexion views. Recommendation:           - Patient has a contact number available for                            emergencies. The signs and symptoms of potential                            delayed complications were discussed with the                            patient. Return to normal activities tomorrow.                            Written discharge instructions were provided to the                            patient.                           - Resume previous diet.                           -  Continue present medications.                           - Repeat colonoscopy is recommended. The                            colonoscopy date will be determined after pathology                            results from today's exam become available for                            review. Gatha Mayer, MD 03/28/2016 9:09:03 AM This report has been signed electronically.

## 2016-03-28 NOTE — Progress Notes (Signed)
A and O x3. Report to RN. Tolerated MAC anesthesia well.

## 2016-03-28 NOTE — Progress Notes (Signed)
Called to room to assist during endoscopic procedure.  Patient ID and intended procedure confirmed with present staff. Received instructions for my participation in the procedure from the performing physician.  

## 2016-03-29 ENCOUNTER — Telehealth: Payer: Self-pay

## 2016-03-29 NOTE — Telephone Encounter (Signed)
  Follow up Call-  Call back number 03/28/2016  Post procedure Call Back phone  # [phone]  Permission to leave phone message Yes  Some recent data might be hidden     Patient questions:  Do you have a fever, pain , or abdominal swelling? No. Pain Score  0 *  Have you tolerated food without any problems? Yes.    Have you been able to return to your normal activities? Yes.    Do you have any questions about your discharge instructions: Diet   No. Medications  No. Follow up visit  No.  Do you have questions or concerns about your Care? No.  Actions: * If pain score is 4 or above: No action needed, pain <4.

## 2016-04-01 ENCOUNTER — Other Ambulatory Visit: Payer: Self-pay | Admitting: Family Medicine

## 2016-04-02 NOTE — Telephone Encounter (Signed)
okay

## 2016-04-02 NOTE — Telephone Encounter (Signed)
LRF 12/22/15 #30 + 2   LOV 01/05/16  OK refill?

## 2016-04-02 NOTE — Telephone Encounter (Signed)
Rx called in 

## 2016-04-04 ENCOUNTER — Encounter: Payer: Self-pay | Admitting: Internal Medicine

## 2016-04-04 DIAGNOSIS — Z860101 Personal history of adenomatous and serrated colon polyps: Secondary | ICD-10-CM

## 2016-04-04 DIAGNOSIS — Z860100 Personal history of colon polyps, unspecified: Secondary | ICD-10-CM | POA: Insufficient documentation

## 2016-04-04 HISTORY — DX: Personal history of colonic polyps: Z86.010

## 2016-04-04 HISTORY — DX: Personal history of adenomatous and serrated colon polyps: Z86.0101

## 2016-04-04 NOTE — Progress Notes (Signed)
Diminutive adenoma Recall 5 yrs 2023

## 2016-05-02 ENCOUNTER — Other Ambulatory Visit: Payer: Self-pay | Admitting: Family Medicine

## 2016-05-03 NOTE — Telephone Encounter (Signed)
Ok to refill??  Last office visit 01/05/2016.  Last refill 04/02/2016.

## 2016-05-03 NOTE — Telephone Encounter (Signed)
Okay to refill? 

## 2016-06-02 ENCOUNTER — Other Ambulatory Visit: Payer: Self-pay | Admitting: Family Medicine

## 2016-06-08 ENCOUNTER — Other Ambulatory Visit: Payer: Self-pay | Admitting: Family Medicine

## 2016-07-05 ENCOUNTER — Other Ambulatory Visit: Payer: Self-pay | Admitting: Family Medicine

## 2016-07-05 NOTE — Telephone Encounter (Signed)
Ok to refill Ambien??  Last office visit 01/05/2016.  Last refill 05/03/2016, #2 refills.

## 2016-07-05 NOTE — Telephone Encounter (Signed)
Okay to refill? 

## 2016-07-26 ENCOUNTER — Other Ambulatory Visit: Payer: Managed Care, Other (non HMO)

## 2016-07-26 DIAGNOSIS — Z13220 Encounter for screening for lipoid disorders: Secondary | ICD-10-CM

## 2016-07-26 DIAGNOSIS — R7989 Other specified abnormal findings of blood chemistry: Secondary | ICD-10-CM

## 2016-07-26 DIAGNOSIS — Z125 Encounter for screening for malignant neoplasm of prostate: Secondary | ICD-10-CM

## 2016-07-26 DIAGNOSIS — I10 Essential (primary) hypertension: Secondary | ICD-10-CM

## 2016-07-26 LAB — CBC WITH DIFFERENTIAL/PLATELET
BASOS PCT: 1 %
Basophils Absolute: 56 cells/uL (ref 0–200)
EOS ABS: 168 {cells}/uL (ref 15–500)
EOS PCT: 3 %
HCT: 45.4 % (ref 38.5–50.0)
Hemoglobin: 15.7 g/dL (ref 13.0–17.0)
LYMPHS ABS: 1344 {cells}/uL (ref 850–3900)
Lymphocytes Relative: 24 %
MCH: 31.7 pg (ref 27.0–33.0)
MCHC: 34.6 g/dL (ref 32.0–36.0)
MCV: 91.5 fL (ref 80.0–100.0)
MONOS PCT: 9 %
MPV: 10.1 fL (ref 7.5–12.5)
Monocytes Absolute: 504 cells/uL (ref 200–950)
NEUTROS ABS: 3528 {cells}/uL (ref 1500–7800)
Neutrophils Relative %: 63 %
PLATELETS: 194 10*3/uL (ref 140–400)
RBC: 4.96 MIL/uL (ref 4.20–5.80)
RDW: 13.1 % (ref 11.0–15.0)
WBC: 5.6 10*3/uL (ref 3.8–10.8)

## 2016-07-26 LAB — COMPLETE METABOLIC PANEL WITH GFR
ALT: 35 U/L (ref 9–46)
AST: 26 U/L (ref 10–35)
Albumin: 4.6 g/dL (ref 3.6–5.1)
Alkaline Phosphatase: 48 U/L (ref 40–115)
BILIRUBIN TOTAL: 1.3 mg/dL — AB (ref 0.2–1.2)
BUN: 16 mg/dL (ref 7–25)
CHLORIDE: 101 mmol/L (ref 98–110)
CO2: 22 mmol/L (ref 20–31)
Calcium: 9.5 mg/dL (ref 8.6–10.3)
Creat: 0.96 mg/dL (ref 0.70–1.25)
GFR, EST NON AFRICAN AMERICAN: 85 mL/min (ref >=60)
Glucose, Bld: 92 mg/dL (ref 70–99)
POTASSIUM: 3.8 mmol/L (ref 3.5–5.3)
SODIUM: 136 mmol/L (ref 135–146)
TOTAL PROTEIN: 7 g/dL (ref 6.1–8.1)

## 2016-07-26 LAB — LIPID PANEL
CHOL/HDL RATIO: 3.5 ratio (ref <5.0)
CHOLESTEROL: 153 mg/dL (ref <200)
HDL: 44 mg/dL (ref >40)
LDL Cholesterol: 91 mg/dL (ref <100)
Triglycerides: 91 mg/dL (ref <150)
VLDL: 18 mg/dL (ref <30)

## 2016-07-27 LAB — TESTOSTERONE: Testosterone: 337 ng/dL (ref 250–827)

## 2016-07-27 LAB — PSA: PSA: 0.7 ng/mL (ref <=4.0)

## 2016-08-02 ENCOUNTER — Encounter: Payer: Self-pay | Admitting: Family Medicine

## 2016-08-02 ENCOUNTER — Ambulatory Visit (INDEPENDENT_AMBULATORY_CARE_PROVIDER_SITE_OTHER): Payer: Managed Care, Other (non HMO) | Admitting: Family Medicine

## 2016-08-02 VITALS — BP 128/78 | HR 66 | Temp 98.4°F | Resp 16 | Ht 73.5 in | Wt 191.0 lb

## 2016-08-02 DIAGNOSIS — G4700 Insomnia, unspecified: Secondary | ICD-10-CM

## 2016-08-02 DIAGNOSIS — Z1159 Encounter for screening for other viral diseases: Secondary | ICD-10-CM

## 2016-08-02 DIAGNOSIS — R7989 Other specified abnormal findings of blood chemistry: Secondary | ICD-10-CM | POA: Diagnosis not present

## 2016-08-02 DIAGNOSIS — I10 Essential (primary) hypertension: Secondary | ICD-10-CM | POA: Diagnosis not present

## 2016-08-02 DIAGNOSIS — Z0000 Encounter for general adult medical examination without abnormal findings: Secondary | ICD-10-CM | POA: Diagnosis not present

## 2016-08-02 MED ORDER — TESTOSTERONE CYPIONATE 200 MG/ML IM SOLN: INTRAMUSCULAR | 2 refills | Status: DC

## 2016-08-02 NOTE — Progress Notes (Signed)
   Subjective:    Patient ID: Nathan Stewart, male    DOB: 29-Jun-1955, 61 y.o.   MRN: [id number]  Patient presents for CPE (ha had labs- is fasting) Patient here for complete physical exam. His medications were reviewed family history and history updated. Immunizations up-to-date Colonoscopy up-to-date repeat in 2023 due to polyp  Discussed hepatitis C screening- wants to defer to next blood draw   He is on testosterone injections for his low testosterone. He is no longer following with urology. He is every 2 weeks  injections.    No longer following with foot doctor  No concerns today  Review Of Systems:  GEN- denies fatigue, fever, weight loss,weakness, recent illness HEENT- denies eye drainage, change in vision, nasal discharge, CVS- denies chest pain, palpitations RESP- denies SOB, cough, wheeze ABD- denies N/V, change in stools, abd pain GU- denies dysuria, hematuria, dribbling, incontinence MSK- denies joint pain, muscle aches, injury Neuro- denies headache, dizziness, syncope, seizure activity       Objective:    BP 128/78   Pulse 66   Temp 98.4 F (36.9 C) (Oral)   Resp 16   Ht 6' 1.5" (1.867 m)   Wt 191 lb (86.6 kg)   SpO2 98%   BMI 24.86 kg/m  GEN- NAD, alert and oriented x3 HEENT- PERRL, EOMI, non injected sclera, pink conjunctiva, MMM, oropharynx clear Neck- Supple, no thyromegaly CVS- RRR, no murmur RESP-CTAB ABD-NABS,soft,NT,ND EXT- No edema Pulses- Radial, DP- 2+        Assessment & Plan:      Problem List Items Addressed This Visit    Low testosterone    Continue testosterone at current dose 200mg  q 2 weeks      INSOMNIA, CHRONIC    Continue Ambien      Essential hypertension    Well controlled       Other Visit Diagnoses    Routine general medical examination at a health care facility    -  Primary   CPE done, no acute problems, immunizations UTD, Hep C next blood draw. Prevention UTD   Encounter for hepatitis C screening test  for low risk patient       Relevant Orders   Hepatitis C antibody      Note: This dictation was prepared with Dragon dictation along with smaller phrase technology. Any transcriptional errors that result from this process are unintentional.

## 2016-08-02 NOTE — Patient Instructions (Signed)
F/U 6 months

## 2016-08-02 NOTE — Assessment & Plan Note (Signed)
Well controlled 

## 2016-08-02 NOTE — Assessment & Plan Note (Signed)
Continue testosterone at current dose 200mg  q 2 weeks

## 2016-08-02 NOTE — Assessment & Plan Note (Signed)
Continue Ambien. 

## 2016-10-09 ENCOUNTER — Other Ambulatory Visit: Payer: Self-pay | Admitting: Family Medicine

## 2016-10-10 NOTE — Telephone Encounter (Signed)
Ok to refill??  Last office visit 08/02/2016.  Last refill 07/05/2016, #2 refills.

## 2016-10-11 NOTE — Telephone Encounter (Signed)
Okay to refill? 

## 2016-10-11 NOTE — Telephone Encounter (Signed)
rx called into pharmacy

## 2016-12-03 ENCOUNTER — Other Ambulatory Visit: Payer: Self-pay | Admitting: Family Medicine

## 2017-01-02 ENCOUNTER — Other Ambulatory Visit: Payer: Self-pay | Admitting: Family Medicine

## 2017-01-14 ENCOUNTER — Other Ambulatory Visit: Payer: Self-pay | Admitting: Family Medicine

## 2017-01-15 NOTE — Telephone Encounter (Signed)
Okay to refill? 

## 2017-01-15 NOTE — Telephone Encounter (Signed)
Ok to refill??  Last office visit 08/02/2016.  Last refill 8/17/218, #2 refills.

## 2017-01-20 NOTE — Telephone Encounter (Signed)
Medication called to pharmacy. 

## 2017-01-31 ENCOUNTER — Other Ambulatory Visit: Payer: Self-pay

## 2017-01-31 ENCOUNTER — Ambulatory Visit: Payer: Managed Care, Other (non HMO) | Admitting: Family Medicine

## 2017-01-31 ENCOUNTER — Other Ambulatory Visit: Payer: Self-pay | Admitting: Podiatry

## 2017-01-31 ENCOUNTER — Ambulatory Visit (INDEPENDENT_AMBULATORY_CARE_PROVIDER_SITE_OTHER): Payer: Managed Care, Other (non HMO)

## 2017-01-31 ENCOUNTER — Ambulatory Visit: Payer: Managed Care, Other (non HMO) | Admitting: Podiatry

## 2017-01-31 ENCOUNTER — Encounter: Payer: Self-pay | Admitting: Family Medicine

## 2017-01-31 ENCOUNTER — Encounter: Payer: Self-pay | Admitting: Podiatry

## 2017-01-31 VITALS — BP 134/78 | HR 82 | Temp 98.1°F | Resp 16 | Ht 73.5 in | Wt 198.0 lb

## 2017-01-31 VITALS — BP 151/88 | HR 68 | Resp 16

## 2017-01-31 DIAGNOSIS — B079 Viral wart, unspecified: Secondary | ICD-10-CM

## 2017-01-31 DIAGNOSIS — I10 Essential (primary) hypertension: Secondary | ICD-10-CM | POA: Diagnosis not present

## 2017-01-31 DIAGNOSIS — M79672 Pain in left foot: Secondary | ICD-10-CM

## 2017-01-31 DIAGNOSIS — Z1159 Encounter for screening for other viral diseases: Secondary | ICD-10-CM

## 2017-01-31 DIAGNOSIS — M216X9 Other acquired deformities of unspecified foot: Secondary | ICD-10-CM

## 2017-01-31 DIAGNOSIS — R7989 Other specified abnormal findings of blood chemistry: Secondary | ICD-10-CM

## 2017-01-31 DIAGNOSIS — G4700 Insomnia, unspecified: Secondary | ICD-10-CM

## 2017-01-31 NOTE — Progress Notes (Signed)
   Subjective:    Patient ID: Nathan Stewart, male    DOB: [birth year], 61 y.o.   MRN: [id number]  Patient presents for Follow-up (is fasting)  Pt here to f/u chronic medical problems Medications reviewed Weight gain 7lbs   HTN- taking BP meds as prescribed, no side effects Testosterone def - injection every 2 weeks, he is only getting bottle every 2 weeks, wants to see if he can get larger quantity at one time   Due for Hep C screening today -  Chronic insomnia- continues on ambien 12.5mg  CR    Review Of Systems:  GEN- denies fatigue, fever, weight loss,weakness, recent illness HEENT- denies eye drainage, change in vision, nasal discharge, CVS- denies chest pain, palpitations RESP- denies SOB, cough, wheeze ABD- denies N/V, change in stools, abd pain GU- denies dysuria, hematuria, dribbling, incontinence MSK- denies joint pain, muscle aches, injury Neuro- denies headache, dizziness, syncope, seizure activity       Objective:    BP 134/78   Pulse 82   Temp 98.1 F (36.7 C) (Oral)   Resp 16   Ht 6' 1.5" (1.867 m)   Wt 198 lb (89.8 kg)   SpO2 97%   BMI 25.77 kg/m  GEN- NAD, alert and oriented x3 HEENT- PERRL, EOMI, non injected sclera, pink conjunctiva, MMM, oropharynx clear CVS- RRR, no murmur RESP-CTAB EXT- No edema Pulses- Radial 2+        Assessment & Plan:     Declines flu shot  Problem List Items Addressed This Visit      Unprioritized   Low testosterone    Check testosterone level PSA has been normal      Relevant Orders   Testosterone   INSOMNIA, CHRONIC    Continues to benefit from [place] CR      Essential hypertension - Primary    Well controlled, no change to medication check renal function      Relevant Orders   Basic metabolic panel    Other Visit Diagnoses    Encounter for hepatitis C screening test for low risk patient          Note: This dictation was prepared with Dragon dictation along with smaller phrase technology. Any  transcriptional errors that result from this process are unintentional.

## 2017-01-31 NOTE — Assessment & Plan Note (Signed)
Well controlled, no change to medication check renal function

## 2017-01-31 NOTE — Patient Instructions (Signed)
F/U 6 months Physical  We will call with lab results

## 2017-01-31 NOTE — Assessment & Plan Note (Signed)
Check testosterone level PSA has been normal

## 2017-01-31 NOTE — Assessment & Plan Note (Signed)
Continues to benefit from [place] CR

## 2017-01-31 NOTE — Progress Notes (Signed)
   Subjective:    Patient ID: Nathan Stewart, male    DOB: 11-09-1955, 61 y.o.   MRN: [id number]  HPI    Review of Systems  All other systems reviewed and are negative.      Objective:   Physical Exam        Assessment & Plan:

## 2017-02-01 LAB — EXTRA LAV TOP TUBE

## 2017-02-01 LAB — BASIC METABOLIC PANEL
BUN: 14 mg/dL (ref 7–25)
CO2: 23 mmol/L (ref 20–32)
CREATININE: 1.07 mg/dL (ref 0.70–1.25)
Calcium: 9.6 mg/dL (ref 8.6–10.3)
Chloride: 101 mmol/L (ref 98–110)
Glucose, Bld: 115 mg/dL — ABNORMAL HIGH (ref 65–99)
POTASSIUM: 3.6 mmol/L (ref 3.5–5.3)
SODIUM: 137 mmol/L (ref 135–146)

## 2017-02-01 LAB — HEPATITIS C ANTIBODY
Hepatitis C Ab: NONREACTIVE
SIGNAL TO CUT-OFF: 0.02 (ref <1.00)

## 2017-02-01 LAB — TESTOSTERONE: Testosterone: 721 ng/dL (ref 250–827)

## 2017-02-04 NOTE — Progress Notes (Signed)
Subjective:   Patient ID: Nathan Stewart, male   DOB: 61 y.o.   MRN: [id number]   HPI Patient presents with severe callus formation left stating that he has had it worked on prior but it is increasingly sore and making it hard to be active.  Patient states that it has gotten worse over the last 6 months.  Patient does not smoke and likes to be active and has a job where he needs to be on his feet at all times   Review of Systems  All other systems reviewed and are negative.       Objective:  Physical Exam  Constitutional: He appears well-developed and well-nourished.  Cardiovascular: Intact distal pulses.  Pulmonary/Chest: Effort normal.  Musculoskeletal: Normal range of motion.  Neurological: He is alert.  Skin: Skin is warm.  Nursing note and vitals reviewed.   Neurovascular status intact muscle strength adequate range of motion within normal limits with patient noted to have significant digital contracture bilateral with hyperkeratotic lesion submetatarsal left with prominent metatarsals noted.  Patient is noted to have good digital perfusion and is well oriented x3 and does have rigid contracture noted.     Assessment:  Structural changes consistent with hammertoe deformity with plantar keratotic lesion with probable plantarflexed metatarsal with possibility that this may be a wart type of condition.     Plan:  H&P condition reviewed at great length.  Reviewed x-ray and today deep debridement accomplished along with padding and medication to create immune response.  Patient will be seen back and ultimately may require more aggressive therapy and possible digital fusion with elevating osteotomy.  X-rays indicate that the lesion is directly on the second and third metatarsal with rigid contracture of the lesser digits noted

## 2017-02-07 ENCOUNTER — Encounter: Payer: Self-pay | Admitting: *Deleted

## 2017-02-08 ENCOUNTER — Other Ambulatory Visit: Payer: Self-pay | Admitting: Family Medicine

## 2017-02-10 NOTE — Telephone Encounter (Signed)
Ok to refill??  Last office visit 01/31/2017.  Last refill 08/02/2016, #2 refills.

## 2017-02-10 NOTE — Telephone Encounter (Signed)
Okay to refill,also remind him insurance will only cover so many doses in bottle

## 2017-02-12 NOTE — Telephone Encounter (Signed)
Medication called to pharmacy. 

## 2017-04-06 DIAGNOSIS — S20219A Contusion of unspecified front wall of thorax, initial encounter: Secondary | ICD-10-CM | POA: Insufficient documentation

## 2017-04-08 DIAGNOSIS — S2239XA Fracture of one rib, unspecified side, initial encounter for closed fracture: Secondary | ICD-10-CM | POA: Insufficient documentation

## 2017-04-19 ENCOUNTER — Other Ambulatory Visit: Payer: Self-pay | Admitting: Family Medicine

## 2017-04-21 NOTE — Telephone Encounter (Signed)
Ok to refill??  Last office visit 01/31/2017.  Last refill 01/20/2017, #2 refills.

## 2017-05-21 ENCOUNTER — Other Ambulatory Visit: Payer: Self-pay | Admitting: Family Medicine

## 2017-06-04 ENCOUNTER — Telehealth: Payer: Self-pay | Admitting: *Deleted

## 2017-06-04 NOTE — Telephone Encounter (Signed)
Received call from patient wife, Gwyndolyn Saxon.   Reports unexpected death in family. Requested low dose anxiety medication.   Per MD, contact patient to discuss.   If PRN anxiety medication is given, patient cannot take with Ambien at night.   Call placed to patient. [place].

## 2017-06-05 ENCOUNTER — Other Ambulatory Visit: Payer: Self-pay | Admitting: *Deleted

## 2017-06-05 MED ORDER — METOPROLOL SUCCINATE ER 100 MG PO TB24: ORAL_TABLET | ORAL | 3 refills | Status: DC

## 2017-06-05 NOTE — Telephone Encounter (Signed)
Patient returned call and made aware.   States that he does feel that he may need some help through the funeral. Verbalized understanding not to take with Ambien.   MD please advise.

## 2017-06-06 MED ORDER — LORAZEPAM 0.5 MG PO TABS: 0.5000 mg | ORAL_TABLET | Freq: Two times a day (BID) | ORAL | 0 refills | Status: DC | PRN

## 2017-06-06 NOTE — Telephone Encounter (Signed)
Okay to send Ativan 0.5mg  BID #20

## 2017-06-06 NOTE — Telephone Encounter (Signed)
Medication pended for your approval.

## 2017-06-20 ENCOUNTER — Other Ambulatory Visit: Payer: Self-pay | Admitting: Family Medicine

## 2017-08-04 ENCOUNTER — Other Ambulatory Visit: Payer: Self-pay | Admitting: Family Medicine

## 2017-08-05 NOTE — Telephone Encounter (Signed)
Ok to refill??  Last office visit 01/31/2017.  Last refill on testosterone 02/12/2017, #1 refill.   Last refill on Ambien 04/21/2017, #2 refills.

## 2017-09-05 ENCOUNTER — Ambulatory Visit (INDEPENDENT_AMBULATORY_CARE_PROVIDER_SITE_OTHER): Payer: 59 | Admitting: Family Medicine

## 2017-09-05 ENCOUNTER — Other Ambulatory Visit: Payer: Self-pay

## 2017-09-05 ENCOUNTER — Encounter: Payer: Self-pay | Admitting: Family Medicine

## 2017-09-05 VITALS — BP 132/80 | HR 82 | Temp 98.1°F | Resp 14 | Ht 73.5 in | Wt 198.0 lb

## 2017-09-05 DIAGNOSIS — Z0000 Encounter for general adult medical examination without abnormal findings: Secondary | ICD-10-CM

## 2017-09-05 DIAGNOSIS — Z125 Encounter for screening for malignant neoplasm of prostate: Secondary | ICD-10-CM

## 2017-09-05 DIAGNOSIS — R7989 Other specified abnormal findings of blood chemistry: Secondary | ICD-10-CM | POA: Diagnosis not present

## 2017-09-05 DIAGNOSIS — I10 Essential (primary) hypertension: Secondary | ICD-10-CM

## 2017-09-05 DIAGNOSIS — G4700 Insomnia, unspecified: Secondary | ICD-10-CM | POA: Diagnosis not present

## 2017-09-05 MED ORDER — TESTOSTERONE CYPIONATE 200 MG/ML IM SOLN
200.0000 mg | Freq: Once | INTRAMUSCULAR | Status: AC
Start: 2017-09-05 — End: 2017-09-05
  Administered 2017-09-05: 200 mg via INTRAMUSCULAR

## 2017-09-05 NOTE — Patient Instructions (Signed)
F/U 6 months  I recommend eye visit once a year I recommend dental visit every 6 months Goal is to  Exercise 30 minutes 5 days a week We will call or send a letter with lab results

## 2017-09-05 NOTE — Progress Notes (Signed)
   Subjective:    Patient ID: Nathan Stewart, male    DOB: 1955-06-17, 62 y.o.   MRN: [id number]  Patient presents for CPE (is fasting) and Injections (testosterone)      Pt here for CPE.    Low Testosterone- currently on testosterone injections , due for one today , taking every 2 weeks , 200mg     Medications and history reviewed    Colonoscopy- UTD     Immuizations-- UTD    Due for fasting labs     No concerns           Review Of Systems:  GEN- denies fatigue, fever, weight loss,weakness, recent illness HEENT- denies eye drainage, change in vision, nasal discharge, CVS- denies chest pain, palpitations RESP- denies SOB, cough, wheeze ABD- denies N/V, change in stools, abd pain GU- denies dysuria, hematuria, dribbling, incontinence MSK- denies joint pain, muscle aches, injury Neuro- denies headache, dizziness, syncope, seizure activity       Objective:    BP 132/80   Pulse 82   Temp 98.1 F (36.7 C) (Oral)   Resp 14   Ht 6' 1.5" (1.867 m)   Wt 198 lb (89.8 kg)   SpO2 95%   BMI 25.77 kg/m  GEN- NAD, alert and oriented x3 HEENT- PERRL, EOMI, non injected sclera, pink conjunctiva, MMM, oropharynx clear, tm CLEAR bilat no effusion  Neck- Supple, no thyromegaly CVS- RRR, no murmur RESP-CTAB ABD-NABS,soft,NT,ND EXT- No edema Pulses- Radial, DP- 2+        Assessment & Plan:      Problem List Items Addressed This Visit      Unprioritized   Essential hypertension    Well controlled no changes      INSOMNIA, CHRONIC    Continue ambien CR      Low testosterone    Check PSA, testosterone level comtinue every 2 week injection      Relevant Medications   testosterone cypionate (DEPOTESTOSTERONE CYPIONATE) injection 200 mg (Completed)   Other Relevant Orders   Testosterone (Completed)    Other Visit Diagnoses    Routine general medical examination at a health care facility    -  Primary   CPE done, immunizations UTD, screening HIV to be done,  fasting labs, no change to meds   Relevant Orders   CBC with Differential/Platelet (Completed)   Comprehensive metabolic panel (Completed)   Lipid panel (Completed)   HIV antibody   Prostate cancer screening       Relevant Orders   PSA (Completed)      Note: This dictation was prepared with Dragon dictation along with smaller phrase technology. Any transcriptional errors that result from this process are unintentional.

## 2017-09-06 ENCOUNTER — Encounter: Payer: Self-pay | Admitting: Family Medicine

## 2017-09-06 LAB — COMPREHENSIVE METABOLIC PANEL
AG Ratio: 1.9 (calc) (ref 1.0–2.5)
ALKALINE PHOSPHATASE (APISO): 57 U/L (ref 40–115)
ALT: 40 U/L (ref 9–46)
AST: 26 U/L (ref 10–35)
Albumin: 4.8 g/dL (ref 3.6–5.1)
BUN: 18 mg/dL (ref 7–25)
CHLORIDE: 102 mmol/L (ref 98–110)
CO2: 23 mmol/L (ref 20–32)
CREATININE: 1.04 mg/dL (ref 0.70–1.25)
Calcium: 9.8 mg/dL (ref 8.6–10.3)
GLOBULIN: 2.5 g/dL (ref 1.9–3.7)
Glucose, Bld: 108 mg/dL — ABNORMAL HIGH (ref 65–99)
Potassium: 4.2 mmol/L (ref 3.5–5.3)
Sodium: 136 mmol/L (ref 135–146)
Total Bilirubin: 1.3 mg/dL — ABNORMAL HIGH (ref 0.2–1.2)
Total Protein: 7.3 g/dL (ref 6.1–8.1)

## 2017-09-06 LAB — CBC WITH DIFFERENTIAL/PLATELET
Basophils Absolute: 28 cells/uL (ref 0–200)
Basophils Relative: 0.4 %
EOS PCT: 3.3 %
Eosinophils Absolute: 231 cells/uL (ref 15–500)
HCT: 46.2 % (ref 38.5–50.0)
Hemoglobin: 16.4 g/dL (ref 13.2–17.1)
Lymphs Abs: 1407 cells/uL (ref 850–3900)
MCH: 32.2 pg (ref 27.0–33.0)
MCHC: 35.5 g/dL (ref 32.0–36.0)
MCV: 90.8 fL (ref 80.0–100.0)
MONOS PCT: 9.8 %
MPV: 10.7 fL (ref 7.5–12.5)
NEUTROS PCT: 66.4 %
Neutro Abs: 4648 cells/uL (ref 1500–7800)
PLATELETS: 211 10*3/uL (ref 140–400)
RBC: 5.09 10*6/uL (ref 4.20–5.80)
RDW: 12.2 % (ref 11.0–15.0)
TOTAL LYMPHOCYTE: 20.1 %
WBC: 7 10*3/uL (ref 3.8–10.8)
WBCMIX: 686 {cells}/uL (ref 200–950)

## 2017-09-06 LAB — LIPID PANEL
CHOL/HDL RATIO: 3.8 (calc) (ref <5.0)
CHOLESTEROL: 165 mg/dL (ref <200)
HDL: 44 mg/dL (ref >40)
LDL Cholesterol (Calc): 100 mg/dL (calc) — ABNORMAL HIGH
Non-HDL Cholesterol (Calc): 121 mg/dL (calc) (ref <130)
TRIGLYCERIDES: 111 mg/dL (ref <150)

## 2017-09-06 LAB — TESTOSTERONE: TESTOSTERONE: 331 ng/dL (ref 250–827)

## 2017-09-06 LAB — HIV ANTIBODY (ROUTINE TESTING W REFLEX): HIV: NONREACTIVE

## 2017-09-06 LAB — PSA: PSA: 0.8 ng/mL (ref <=4.0)

## 2017-09-06 NOTE — Assessment & Plan Note (Signed)
Check PSA, testosterone level comtinue every 2 week injection

## 2017-09-06 NOTE — Assessment & Plan Note (Signed)
Continue ambien CR 

## 2017-09-06 NOTE — Assessment & Plan Note (Signed)
Well controlled no changes 

## 2017-09-19 LAB — HM DIABETES EYE EXAM

## 2017-09-20 ENCOUNTER — Other Ambulatory Visit: Payer: Self-pay | Admitting: Family Medicine

## 2017-09-22 ENCOUNTER — Encounter: Payer: Self-pay | Admitting: *Deleted

## 2017-10-02 ENCOUNTER — Telehealth: Payer: Self-pay | Admitting: *Deleted

## 2017-10-02 MED ORDER — TESTOSTERONE CYPIONATE 200 MG/ML IM SOLN: INTRAMUSCULAR | 1 refills | Status: DC

## 2017-10-02 NOTE — Telephone Encounter (Signed)
Patient returned call.  States that he would like to send to [facility] on [address].

## 2017-10-02 NOTE — Telephone Encounter (Signed)
Received fax requesting alternative to Testosterone Cypionate. Advised that [facility] [address] [address] is out of medication and manufacturer is currently on back order. Requested alternative.   Call placed to other pharmacies. Was advised that Testosterone Cypionate is available.   Call placed to patient to inquire as to what pharmacy he prefers.   Ok to re-send to different pharmacy?

## 2017-10-17 ENCOUNTER — Ambulatory Visit: Payer: 59 | Admitting: Podiatry

## 2017-10-17 ENCOUNTER — Encounter: Payer: Self-pay | Admitting: Podiatry

## 2017-10-17 DIAGNOSIS — Q828 Other specified congenital malformations of skin: Secondary | ICD-10-CM | POA: Diagnosis not present

## 2017-10-18 NOTE — Progress Notes (Signed)
Subjective: 62 year old male presents the office today for concerns of a painful callus left foot she points to submetatarsal 2.  He had this area trimmed out previously and is done well until recently it started to come back has been causing a lot of pain with pressure in shoes.  Denies any redness or drainage or any swelling to the site.  He has to wear safety shoes and he does a lot of standing or walking at work aggravates the symptoms.  He has no recent injury.  No other concerns. Denies any systemic complaints such as fevers, chills, nausea, vomiting. No acute changes since last appointment, and no other complaints at this time.   Objective: AAO x3, NAD DP/PT pulses palpable bilaterally, CRT less than 3 seconds Prominence of metatarsal heads plantarly with atrophy of the fat pad.  Hyperkeratotic lesion left foot submetatarsal 2.  Upon debridement there is no underlying ulceration, drainage or any signs of infection and there is no clinical evidence of wart. No open lesions or pre-ulcerative lesions.  No pain with calf compression, swelling, warmth, erythema  Assessment: Hypereratotic lesion left foot  Plan: -All treatment options discussed with the patient including all alternatives, risks, complications.  -Lesion was sharply debrided x1 without any complications, minimal amount of bleeding occurred.  Areas cleaned with alcohol and antibiotic ointment and a bandage was applied.  The next couple of days and monitoring signs or symptoms of long-term he will likely benefit orthotic or we can consider surgery if needed.  Dispensed various offloading pads. -Patient encouraged to call the office with any questions, concerns, change in symptoms.   Trula Slade DPM

## 2017-12-23 ENCOUNTER — Other Ambulatory Visit: Payer: Self-pay | Admitting: Family Medicine

## 2017-12-23 NOTE — Telephone Encounter (Signed)
Ok to refill??  Last office visit 09/05/2017.  Last refill 08/05/2017, #2 refills.

## 2018-01-18 ENCOUNTER — Other Ambulatory Visit: Payer: Self-pay | Admitting: Family Medicine

## 2018-02-13 ENCOUNTER — Other Ambulatory Visit: Payer: Self-pay | Admitting: Family Medicine

## 2018-03-12 ENCOUNTER — Other Ambulatory Visit: Payer: Self-pay | Admitting: Family Medicine

## 2018-03-13 ENCOUNTER — Other Ambulatory Visit: Payer: Self-pay

## 2018-03-13 ENCOUNTER — Ambulatory Visit: Payer: 59 | Admitting: Family Medicine

## 2018-03-13 ENCOUNTER — Ambulatory Visit
Admission: RE | Admit: 2018-03-13 | Discharge: 2018-03-13 | Disposition: A | Discharge status: Home / Self Care | Payer: Managed Care, Other (non HMO) | Source: Ambulatory Visit | Attending: Family Medicine | Admitting: Family Medicine

## 2018-03-13 ENCOUNTER — Encounter: Payer: Self-pay | Admitting: Family Medicine

## 2018-03-13 VITALS — BP 138/68 | HR 62 | Temp 98.3°F | Resp 14 | Ht 73.5 in | Wt 204.0 lb

## 2018-03-13 DIAGNOSIS — R7989 Other specified abnormal findings of blood chemistry: Secondary | ICD-10-CM

## 2018-03-13 DIAGNOSIS — H35033 Hypertensive retinopathy, bilateral: Secondary | ICD-10-CM | POA: Diagnosis not present

## 2018-03-13 DIAGNOSIS — G4700 Insomnia, unspecified: Secondary | ICD-10-CM

## 2018-03-13 DIAGNOSIS — G8929 Other chronic pain: Secondary | ICD-10-CM

## 2018-03-13 DIAGNOSIS — R0609 Other forms of dyspnea: Secondary | ICD-10-CM | POA: Diagnosis not present

## 2018-03-13 DIAGNOSIS — I10 Essential (primary) hypertension: Secondary | ICD-10-CM

## 2018-03-13 DIAGNOSIS — R0600 Dyspnea, unspecified: Secondary | ICD-10-CM

## 2018-03-13 DIAGNOSIS — M25512 Pain in left shoulder: Secondary | ICD-10-CM

## 2018-03-13 NOTE — Progress Notes (Signed)
Subjective:    Patient ID: Nathan Stewart, male    DOB: 1955/11/24, 63 y.o.   MRN: [id number]  Patient presents for Follow-up (is fasting)  Here to follow-up chronic medical problems.  Medications reviewed. Hypertension-pressure has been fairly well controlled.  Is taking the amlodipine, hydrochlorothiazide and metoprolol.  Chronic insomnia maintained on Ambien extended release this is worked the best though he still does have some difficulty sleeping.   ( [facility]) Testosterone deficiency he injects 200 mg every 2 weeks.  His PSA was normal in July. Injection done last Friday   Followed by podiatry- Dr Jacqualyn Posey for hyperkeratotic lesion left foot    Left shoulder pain- [facility] , getting popping sound and pain on anterior shoulder does a lot of repetitive work and lifting.    He had another fall in November, he mistepped and protect his right wrist, he feel forward and hit his chest.    He has a soreness in his chest, no chest pain, no difficulty breathing, no wheezing or cough  He just doesn't think it his breathing is right, he was huffing with playing golf and some of his other regular activities, which is unusual for him        Review Of Systems:  GEN- denies fatigue, fever, weight loss,weakness, recent illness HEENT- denies eye drainage, change in vision, nasal discharge, CVS- denies chest pain, palpitations RESP- + SOB, denies cough, wheeze ABD- denies N/V, change in stools, abd pain GU- denies dysuria, hematuria, dribbling, incontinence MSK- +joint pain, muscle aches, injury Neuro- denies headache, dizziness, syncope, seizure activity       Objective:    BP 138/68   Pulse 62   Temp 98.3 F (36.8 C) (Oral)   Resp 14   Ht 6' 1.5" (1.867 m)   Wt 204 lb (92.5 kg)   SpO2 98%   BMI 26.55 kg/m  GEN- NAD, alert and oriented x3 HEENT- PERRL, EOMI, non injected sclera, pink conjunctiva, MMM, oropharynx clear Neck- Supple, no thyromegaly, no JVD  CVS-  RRR, no murmur RESP-CTAB ABD-NABS,soft,NT,ND MSK- Left shoulder decreased ROM compared to right, rotator cuff grossly in tact, biceps in tact, popping with elevation of arm EXT- No edema Pulses- Radial, DP- 2+   EKG- NSR, no ST changes HR 59      Assessment & Plan:      Problem List Items Addressed This Visit      Unprioritized   Essential hypertension - Primary    Blood pressure is well controlled.  We will recheck metabolic and CBC.  Have hypertensive retinopathy he is followed by an ophthalmologist.      Relevant Orders   CBC with Differential/Platelet   Basic metabolic panel   INSOMNIA, CHRONIC    Continues to benefit from Ambien       Low testosterone    Continue with testosterone supplementation.  He has been having these dyspnea episodes.  His EKG was reassuring I am then to get a chest x-ray.  We will also check a CBC ensure that his hemoglobin is not too elevated.  Overall he looks well there is no sign of any illness no sign of heart failure with regards to the shortness of breath episodes.  He has gained some weight which he can tell a difference as well with his breathing.  He did sustained a fall which may resulted in a rib contusion but should not be causing any problems with his breathing at this time but a chest x-ray  will show if there are any abnormalities.      Relevant Orders   Testosterone   Retinopathy, hypertensive, both eyes    Other Visit Diagnoses    Chronic left shoulder pain       Referral to orthopedics at request.   Relevant Orders   Ambulatory referral to Orthopedic Surgery   DOE (dyspnea on exertion)       Relevant Orders   DG Chest 2 View   EKG 12-Lead (Completed)      Note: This dictation was prepared with Dragon dictation along with smaller phrase technology. Any transcriptional errors that result from this process are unintentional.

## 2018-03-13 NOTE — Assessment & Plan Note (Signed)
Continues to benefit from Ambien

## 2018-03-13 NOTE — Assessment & Plan Note (Signed)
Blood pressure is well controlled.  We will recheck metabolic and CBC.  Have hypertensive retinopathy he is followed by an ophthalmologist.

## 2018-03-13 NOTE — Assessment & Plan Note (Signed)
Continue with testosterone supplementation.  He has been having these dyspnea episodes.  His EKG was reassuring I am then to get a chest x-ray.  We will also check a CBC ensure that his hemoglobin is not too elevated.  Overall he looks well there is no sign of any illness no sign of heart failure with regards to the shortness of breath episodes.  He has gained some weight which he can tell a difference as well with his breathing.  He did sustained a fall which may resulted in a rib contusion but should not be causing any problems with his breathing at this time but a chest x-ray will show if there are any abnormalities.

## 2018-03-13 NOTE — Patient Instructions (Addendum)
F/U 6 months for physical Referral to orthopedics  Get the chest xray - [place] [address]

## 2018-03-17 ENCOUNTER — Other Ambulatory Visit: Payer: Self-pay | Admitting: Family Medicine

## 2018-03-17 LAB — CBC WITH DIFFERENTIAL/PLATELET
Absolute Monocytes: 632 cells/uL (ref 200–950)
BASOS PCT: 0.4 %
Basophils Absolute: 27 cells/uL (ref 0–200)
EOS ABS: 197 {cells}/uL (ref 15–500)
Eosinophils Relative: 2.9 %
HCT: 47.1 % (ref 38.5–50.0)
Hemoglobin: 16.4 g/dL (ref 13.2–17.1)
Lymphs Abs: 1469 cells/uL (ref 850–3900)
MCH: 32 pg (ref 27.0–33.0)
MCHC: 34.8 g/dL (ref 32.0–36.0)
MCV: 91.8 fL (ref 80.0–100.0)
MPV: 10.7 fL (ref 7.5–12.5)
Monocytes Relative: 9.3 %
Neutro Abs: 4474 cells/uL (ref 1500–7800)
Neutrophils Relative %: 65.8 %
PLATELETS: 226 10*3/uL (ref 140–400)
RBC: 5.13 10*6/uL (ref 4.20–5.80)
RDW: 11.9 % (ref 11.0–15.0)
Total Lymphocyte: 21.6 %
WBC: 6.8 10*3/uL (ref 3.8–10.8)

## 2018-03-17 LAB — BASIC METABOLIC PANEL
BUN: 12 mg/dL (ref 7–25)
CALCIUM: 9.6 mg/dL (ref 8.6–10.3)
CHLORIDE: 100 mmol/L (ref 98–110)
CO2: 23 mmol/L (ref 20–32)
Creat: 1.03 mg/dL (ref 0.70–1.25)
GLUCOSE: 115 mg/dL — AB (ref 65–99)
POTASSIUM: 3.6 mmol/L (ref 3.5–5.3)
SODIUM: 137 mmol/L (ref 135–146)

## 2018-03-17 LAB — TEST AUTHORIZATION

## 2018-03-17 LAB — TESTOSTERONE: TESTOSTERONE: 791 ng/dL (ref 250–827)

## 2018-03-17 LAB — HEMOGLOBIN A1C W/OUT EAG: Hgb A1c MFr Bld: 4.8 % of total Hgb (ref <5.7)

## 2018-03-17 NOTE — Telephone Encounter (Signed)
Ok to refill??  Last office visit 03/13/2018.  Last refill 10/02/2017.

## 2018-04-09 ENCOUNTER — Other Ambulatory Visit: Payer: Self-pay | Admitting: Family Medicine

## 2018-04-10 NOTE — Telephone Encounter (Signed)
Requesting refill    Ambien  LOV: 03/13/2018  LRF:  12/23/17

## 2018-05-08 ENCOUNTER — Encounter: Payer: Self-pay | Admitting: Podiatry

## 2018-05-08 ENCOUNTER — Other Ambulatory Visit: Payer: Self-pay

## 2018-05-08 ENCOUNTER — Ambulatory Visit: Payer: 59 | Admitting: Podiatry

## 2018-05-08 DIAGNOSIS — M79672 Pain in left foot: Secondary | ICD-10-CM

## 2018-05-08 DIAGNOSIS — Q828 Other specified congenital malformations of skin: Secondary | ICD-10-CM

## 2018-05-08 NOTE — Patient Instructions (Signed)
Look at getting a "superfeet" insert for your shoes. We can add a metatarsal pad to it to help take pressure off of the lesion.  You can keep moisturizer on it daily.

## 2018-05-17 NOTE — Progress Notes (Signed)
Subjective: 63 year old male presents the office today for concerns of recurrent calluses to the bottom of his left foot.  Denies any pain other than the calluses and denies any swelling or redness or any drainage.  No recent treatment. Denies any systemic complaints such as fevers, chills, nausea, vomiting. No acute changes since last appointment, and no other complaints at this time.   Objective: AAO x3, NAD DP/PT pulses palpable bilaterally, CRT less than 3 seconds Hyperkeratotic lesion submetatarsal 2 on the left foot.  There is a deep, punctate lesion.  There is no underlying ulceration, drainage or any clinical signs of infection noted today. No open lesions or pre-ulcerative lesions.  No pain with calf compression, swelling, warmth, erythema  Assessment: Porokertosis left foot  Plan: -All treatment options discussed with the patient including all alternatives, risks, complications.  -Shelby debrided the hyperkeratotic lesion of the left foot without any complications or bleeding.  Discussed offloading pads.  They do help.  We discussed getting an over-the-counter insert and had a metatarsal pad for this as well.  Moisturizer daily. -Patient encouraged to call the office with any questions, concerns, change in symptoms.   Trula Slade DPM

## 2018-07-10 ENCOUNTER — Other Ambulatory Visit: Payer: Self-pay | Admitting: Family Medicine

## 2018-07-21 ENCOUNTER — Other Ambulatory Visit: Payer: Self-pay | Admitting: Family Medicine

## 2018-07-21 NOTE — Telephone Encounter (Signed)
Requesting refill    Ambien  LOV: 03/13/18  LRF:  04/10/18

## 2018-08-10 ENCOUNTER — Other Ambulatory Visit: Payer: Self-pay | Admitting: Family Medicine

## 2018-09-18 ENCOUNTER — Encounter: Payer: 59 | Admitting: Family Medicine

## 2018-09-29 ENCOUNTER — Other Ambulatory Visit: Payer: Self-pay | Admitting: Family Medicine

## 2018-09-30 NOTE — Telephone Encounter (Signed)
Ok to refill??  Last office visit 03/13/2018.  Last refill 03/17/2018, #2 refills.

## 2018-10-01 ENCOUNTER — Encounter: Payer: Self-pay | Admitting: Family Medicine

## 2018-10-08 ENCOUNTER — Other Ambulatory Visit: Payer: Self-pay

## 2018-10-09 ENCOUNTER — Encounter: Payer: Self-pay | Admitting: Family Medicine

## 2018-10-09 ENCOUNTER — Ambulatory Visit (INDEPENDENT_AMBULATORY_CARE_PROVIDER_SITE_OTHER): Payer: 59 | Admitting: Family Medicine

## 2018-10-09 VITALS — BP 136/80 | HR 72 | Temp 98.5°F | Resp 14 | Ht 73.5 in | Wt 192.0 lb

## 2018-10-09 DIAGNOSIS — Z0000 Encounter for general adult medical examination without abnormal findings: Secondary | ICD-10-CM

## 2018-10-09 DIAGNOSIS — G4700 Insomnia, unspecified: Secondary | ICD-10-CM

## 2018-10-09 DIAGNOSIS — Z0001 Encounter for general adult medical examination with abnormal findings: Secondary | ICD-10-CM | POA: Diagnosis not present

## 2018-10-09 DIAGNOSIS — Z125 Encounter for screening for malignant neoplasm of prostate: Secondary | ICD-10-CM

## 2018-10-09 DIAGNOSIS — Z23 Encounter for immunization: Secondary | ICD-10-CM

## 2018-10-09 DIAGNOSIS — I10 Essential (primary) hypertension: Secondary | ICD-10-CM | POA: Diagnosis not present

## 2018-10-09 DIAGNOSIS — R7989 Other specified abnormal findings of blood chemistry: Secondary | ICD-10-CM

## 2018-10-09 NOTE — Progress Notes (Signed)
   Subjective:    Patient ID: Nathan Stewart, male    DOB: [birth year], 63 y.o.   MRN: [id number]  Patient presents for Annual Exam (is fasting)    Pt here for CPE   Medications and history reviewed     HTN- taking norvasc 5mg , HCTZ 25mg , Toprol 100mg     Hyperlipidemia-    chronic insomnia-      Immunziations- Due for TDAP , zostavax UTD    Testosterone deficiency - 200mg  every 2 weeks   Weight down 12lbs since Jan , working many hours, cuts back in summer    Followed by podiatry - Dr Jacqualyn Posey , Had eye visit last month told pressures were normal    Colonoscopy- UTD     Review Of Systems:  GEN- denies fatigue, fever, weight loss,weakness, recent illness HEENT- denies eye drainage, change in vision, nasal discharge, CVS- denies chest pain, palpitations RESP- denies SOB, cough, wheeze ABD- denies N/V, change in stools, abd pain GU- denies dysuria, hematuria, dribbling, incontinence MSK- denies joint pain, muscle aches, injury Neuro- denies headache, dizziness, syncope, seizure activity       Objective:    BP 136/80   Pulse 72   Temp 98.5 F (36.9 C) (Oral)   Resp 14   Ht 6' 1.5" (1.867 m)   Wt 192 lb (87.1 kg)   SpO2 97%   BMI 24.99 kg/m  GEN- NAD, alert and oriented x3 HEENT- PERRL, EOMI, non injected sclera, pink conjunctiva, MMM, oropharynx clear Neck- Supple, no thyromegaly CVS- RRR, no murmur RESP-CTAB ABD-NABS,soft,NT,ND EXT- No edema Pulses- Radial, DP- 2+   Audit C/Fall/Depression screen negative      Assessment & Plan:      Problem List Items Addressed This Visit      Unprioritized   Essential hypertension   Relevant Orders   Comprehensive metabolic panel (Completed)   Lipid panel (Completed)   INSOMNIA, CHRONIC   Low testosterone   Relevant Orders   Testosterone (Completed)    Other Visit Diagnoses    Routine general medical examination at a health care facility    -  Primary   CPE dobe, fasting labs, TDAP given, PSA screening on  testosterone which he continues to benefit, BP controlled. continue ambien for insomnia    Relevant Orders   CBC with Differential/Platelet (Completed)   Comprehensive metabolic panel (Completed)   Lipid panel (Completed)   Prostate cancer screening       Relevant Orders   PSA (Completed)   Need for vaccination       Relevant Orders   Tdap vaccine greater than or equal to 7yo IM (Completed)      Note: This dictation was prepared with Dragon dictation along with smaller phrase technology. Any transcriptional errors that result from this process are unintentional.

## 2018-10-09 NOTE — Patient Instructions (Signed)
F/U 6 months We will call with lab results  TDAP given

## 2018-10-10 LAB — CBC WITH DIFFERENTIAL/PLATELET
Absolute Monocytes: 562 cells/uL (ref 200–950)
Basophils Absolute: 44 cells/uL (ref 0–200)
Basophils Relative: 0.6 %
Eosinophils Absolute: 153 cells/uL (ref 15–500)
Eosinophils Relative: 2.1 %
HCT: 49.2 % (ref 38.5–50.0)
Hemoglobin: 17 g/dL (ref 13.2–17.1)
Lymphs Abs: 1606 cells/uL (ref 850–3900)
MCH: 31.9 pg (ref 27.0–33.0)
MCHC: 34.6 g/dL (ref 32.0–36.0)
MCV: 92.3 fL (ref 80.0–100.0)
MPV: 10.7 fL (ref 7.5–12.5)
Monocytes Relative: 7.7 %
Neutro Abs: 4935 cells/uL (ref 1500–7800)
Neutrophils Relative %: 67.6 %
Platelets: 227 10*3/uL (ref 140–400)
RBC: 5.33 10*6/uL (ref 4.20–5.80)
RDW: 11.6 % (ref 11.0–15.0)
Total Lymphocyte: 22 %
WBC: 7.3 10*3/uL (ref 3.8–10.8)

## 2018-10-10 LAB — PSA: PSA: 0.8 ng/mL (ref <=4.0)

## 2018-10-10 LAB — LIPID PANEL
Cholesterol: 160 mg/dL (ref <200)
HDL: 39 mg/dL — ABNORMAL LOW (ref >=40)
LDL Cholesterol (Calc): 100 mg/dL (calc) — ABNORMAL HIGH
Non-HDL Cholesterol (Calc): 121 mg/dL (calc) (ref <130)
Total CHOL/HDL Ratio: 4.1 (calc) (ref <5.0)
Triglycerides: 116 mg/dL (ref <150)

## 2018-10-10 LAB — COMPREHENSIVE METABOLIC PANEL
AG Ratio: 1.7 (calc) (ref 1.0–2.5)
ALT: 32 U/L (ref 9–46)
AST: 24 U/L (ref 10–35)
Albumin: 4.6 g/dL (ref 3.6–5.1)
Alkaline phosphatase (APISO): 51 U/L (ref 35–144)
BUN: 16 mg/dL (ref 7–25)
CO2: 24 mmol/L (ref 20–32)
Calcium: 10.3 mg/dL (ref 8.6–10.3)
Chloride: 100 mmol/L (ref 98–110)
Creat: 0.9 mg/dL (ref 0.70–1.25)
Globulin: 2.7 g/dL (calc) (ref 1.9–3.7)
Glucose, Bld: 102 mg/dL — ABNORMAL HIGH (ref 65–99)
Potassium: 4 mmol/L (ref 3.5–5.3)
Sodium: 136 mmol/L (ref 135–146)
Total Bilirubin: 1.1 mg/dL (ref 0.2–1.2)
Total Protein: 7.3 g/dL (ref 6.1–8.1)

## 2018-10-10 LAB — TESTOSTERONE: Testosterone: 803 ng/dL (ref 250–827)

## 2018-10-11 MED ORDER — ZOLPIDEM TARTRATE ER 12.5 MG PO TBCR: EXTENDED_RELEASE_TABLET | ORAL | 2 refills | Status: DC

## 2018-10-25 ENCOUNTER — Other Ambulatory Visit: Payer: Self-pay | Admitting: Family Medicine

## 2018-10-26 ENCOUNTER — Ambulatory Visit: Payer: 59 | Admitting: Podiatry

## 2018-10-26 ENCOUNTER — Other Ambulatory Visit: Payer: Self-pay

## 2018-10-26 DIAGNOSIS — Q828 Other specified congenital malformations of skin: Secondary | ICD-10-CM

## 2018-10-26 DIAGNOSIS — M79672 Pain in left foot: Secondary | ICD-10-CM | POA: Diagnosis not present

## 2018-10-26 NOTE — Progress Notes (Signed)
Subjective: 63 year old male presents the office today for concerns of recurrent calluses to the bottom of his left foot.  He did recently purchase some new work shoes any product insert for me to have a pad.  This is been ongoing issue for several years.  He states that it went away for several years but came back after having knee surgery.  Therapist reports of his foot.  He denies any redness or drainage or any swelling.  Objective: AAO x3, NAD DP/PT pulses palpable bilaterally, CRT less than 3 seconds Hyperkeratotic lesion submetatarsal 2 on the left foot.  There is a deep, punctate lesion.  There is no underlying ulceration, drainage or any clinical signs of infection noted today.  No signs of verruca No open lesions or pre-ulcerative lesions.  No pain with calf compression, swelling, warmth, erythema  Assessment: Porokertosis left foot  Plan: -All treatment options discussed with the patient including all alternatives, risks, complications.  -Sharply debrided the hyperkeratotic lesion of the left foot without any complications or bleeding.  Discussed offloading pads.  I had an offloading pad to his insert for his work shoes.  -Moisturizer daily  Return if symptoms worsen or fail to improve.  Trula Slade DPM

## 2018-10-30 ENCOUNTER — Other Ambulatory Visit: Payer: Self-pay | Admitting: Family Medicine

## 2018-10-30 NOTE — Telephone Encounter (Signed)
Last seen" 10/09/2018 Last refilled: 10/11/2018

## 2018-11-24 ENCOUNTER — Other Ambulatory Visit: Payer: Self-pay | Admitting: *Deleted

## 2018-11-24 MED ORDER — LORAZEPAM 0.5 MG PO TABS: 0.5000 mg | ORAL_TABLET | Freq: Two times a day (BID) | ORAL | 1 refills | Status: DC | PRN

## 2018-11-24 NOTE — Telephone Encounter (Signed)
Received call from patient wife, Gwyndolyn Saxon.   States that patient is having increased anxiety at this time due to [place]. Requested refill on Ativan that was given last year for anxiety as it was really helpful.   Ok to refill??  Last office visit 10/09/2018.  Last refill 06/06/2017.

## 2018-12-05 ENCOUNTER — Other Ambulatory Visit: Payer: Self-pay | Admitting: Family Medicine

## 2018-12-28 ENCOUNTER — Other Ambulatory Visit: Payer: Self-pay | Admitting: Family Medicine

## 2019-01-29 ENCOUNTER — Other Ambulatory Visit: Payer: Self-pay | Admitting: Family Medicine

## 2019-02-01 NOTE — Telephone Encounter (Signed)
Ok to refill??  Last office visit 10/09/2018.  Last refill 10/30/2018, #2 refills.

## 2019-02-26 HISTORY — PX: SHOULDER SURGERY: SHX246

## 2019-04-12 ENCOUNTER — Other Ambulatory Visit: Payer: Self-pay | Admitting: Family Medicine

## 2019-04-12 NOTE — Telephone Encounter (Signed)
Ok to refill??  Last office visit 10/09/2018.  Last refill 09/30/2018, #2 refills.

## 2019-05-04 ENCOUNTER — Other Ambulatory Visit: Payer: Self-pay | Admitting: Family Medicine

## 2019-05-04 NOTE — Telephone Encounter (Signed)
Ok to refill??  Last office visit 10/12/2018.  Last refill 02/01/2019.

## 2019-06-03 ENCOUNTER — Other Ambulatory Visit: Payer: Self-pay | Admitting: Family Medicine

## 2019-08-02 ENCOUNTER — Other Ambulatory Visit: Payer: Self-pay | Admitting: Family Medicine

## 2019-08-03 NOTE — Telephone Encounter (Signed)
Ok to refill??  Last office visit 10/09/2018.  Last refill 05/04/2019, #2 refills.   Of note, patient has appointment scheduled for 10/22/2019 for CPE.

## 2019-10-07 ENCOUNTER — Other Ambulatory Visit: Payer: Self-pay | Admitting: Family Medicine

## 2019-10-22 ENCOUNTER — Other Ambulatory Visit: Payer: Self-pay

## 2019-10-22 ENCOUNTER — Ambulatory Visit (INDEPENDENT_AMBULATORY_CARE_PROVIDER_SITE_OTHER): Payer: 59 | Admitting: Family Medicine

## 2019-10-22 ENCOUNTER — Encounter: Payer: Self-pay | Admitting: Family Medicine

## 2019-10-22 VITALS — BP 138/80 | HR 94 | Temp 97.9°F | Resp 14 | Ht 74.0 in | Wt 203.0 lb

## 2019-10-22 DIAGNOSIS — Z0000 Encounter for general adult medical examination without abnormal findings: Secondary | ICD-10-CM

## 2019-10-22 DIAGNOSIS — Z125 Encounter for screening for malignant neoplasm of prostate: Secondary | ICD-10-CM | POA: Diagnosis not present

## 2019-10-22 DIAGNOSIS — Z0001 Encounter for general adult medical examination with abnormal findings: Secondary | ICD-10-CM

## 2019-10-22 DIAGNOSIS — M7021 Olecranon bursitis, right elbow: Secondary | ICD-10-CM

## 2019-10-22 DIAGNOSIS — R7989 Other specified abnormal findings of blood chemistry: Secondary | ICD-10-CM | POA: Diagnosis not present

## 2019-10-22 DIAGNOSIS — I10 Essential (primary) hypertension: Secondary | ICD-10-CM | POA: Diagnosis not present

## 2019-10-22 LAB — PSA: PSA: 0.8 ng/mL (ref <=4.0)

## 2019-10-22 LAB — TESTOSTERONE: Testosterone: 684 ng/dL (ref 250–827)

## 2019-10-22 MED ORDER — TESTOSTERONE CYPIONATE 200 MG/ML IM SOLN: INTRAMUSCULAR | 2 refills | Status: DC

## 2019-10-22 MED ORDER — ZOLPIDEM TARTRATE ER 12.5 MG PO TBCR: EXTENDED_RELEASE_TABLET | ORAL | 2 refills | Status: DC

## 2019-10-22 NOTE — Patient Instructions (Addendum)
F/U 6 months

## 2019-10-22 NOTE — Assessment & Plan Note (Signed)
Controlled no changes 

## 2019-10-22 NOTE — Progress Notes (Signed)
   Subjective:    Patient ID: Nathan Stewart, male    DOB: [birth year], 64 y.o.   MRN: [id number]  Patient presents for Annual Exam (is fasting)   Pt here to f/u CPE    He had Right shoulder surgery 2 months ago, came out sling and started therapy 1 week ago,  With [employer], has F/U on 8/27   now on tylenol twice a day  He does have swelling on right elbow since he was wearing the sling, no pain   Medications and history reviewed   HTN- taking metoprolol, HCTZ , norvasc   Chronic insomnia- taking ambien 12.5mg   No longer on ativan   Testosterone - given 268m every 2 weeks   Taking vitamins   Colonoscopy UTD  Prostate cancer screening- PSA   He had [place] screening and had a multiple labs done fasting, he is waiting for results to bring me   Immunizations UTD            Review Of Systems:  GEN- denies fatigue, fever, weight loss,weakness, recent illness HEENT- denies eye drainage, change in vision, nasal discharge, CVS- denies chest pain, palpitations RESP- denies SOB, cough, wheeze ABD- denies N/V, change in stools, abd pain GU- denies dysuria, hematuria, dribbling, incontinence MSK- + joint pain, muscle aches, injury Neuro- denies headache, dizziness, syncope, seizure activity       Objective:    BP 138/80   Pulse 94   Temp 97.9 F (36.6 C) (Temporal)   Resp 14   Ht 6\' 2"  (1.88 m)   Wt 203 lb (92.1 kg)   SpO2 96%   BMI 26.06 kg/m  GEN- NAD, alert and oriented x3 HEENT- PERRL, EOMI, non injected sclera, pink conjunctiva, MMM, oropharynx clear Neck- Supple, no thyromegaly CVS- RRR, no murmur RESP-CTAB ABD-NABS,soft,NT,ND MSK- Right Elbow swelling of olecranon bursa , NT, no erythema, no warmth, decreased ROM RUE  Psych normal affect and mood  EXT- No edema Pulses- Radial, DP- 2+  FALL/DEPRESSION/AUDIT C screen neg       Assessment & Plan:      Problem List Items Addressed This Visit      Unprioritized   Essential  hypertension    Controlled no changes       Low testosterone   Relevant Orders   Testosterone    Other Visit Diagnoses    Routine general medical examination at a health care facility    -  Primary   CPE done, fasting labs done at work, check PSA/ testosterone level as he is on replacement   Prostate cancer screening       Relevant Orders   PSA   Olecranon bursitis of right elbow       Non Tender, no sign of infection, can f/u ortho, it will resolve over time       Note: This dictation was prepared with Dragon dictation along with smaller phrase technology. Any transcriptional errors that result from this process are unintentional.

## 2019-10-27 ENCOUNTER — Encounter: Payer: Self-pay | Admitting: *Deleted

## 2019-11-08 ENCOUNTER — Other Ambulatory Visit: Payer: Self-pay | Admitting: Family Medicine

## 2019-11-12 ENCOUNTER — Other Ambulatory Visit: Payer: Self-pay | Admitting: Family Medicine

## 2019-11-28 ENCOUNTER — Other Ambulatory Visit: Payer: Self-pay | Admitting: Family Medicine

## 2019-12-09 ENCOUNTER — Other Ambulatory Visit: Payer: Self-pay | Admitting: Family Medicine

## 2019-12-20 ENCOUNTER — Other Ambulatory Visit: Payer: Self-pay | Admitting: Family Medicine

## 2019-12-31 ENCOUNTER — Ambulatory Visit: Payer: 59 | Attending: Internal Medicine

## 2019-12-31 DIAGNOSIS — Z23 Encounter for immunization: Secondary | ICD-10-CM

## 2019-12-31 NOTE — Progress Notes (Signed)
   Covid-[facility] Vaccination [facility]  Name:  VASILI FOK    MRN: [id number] DOB: 1956/01/25  12/31/2019  Mr. Atteberry was observed post Covid-19 immunization for 15 minutes without incident. He was provided with Vaccine Information Sheet and instruction to access the V-Safe system.   Mr. Docken was instructed to call 911 with any severe reactions post vaccine: Marland Kitchen Difficulty breathing  . Swelling of face and throat  . A fast heartbeat  . A bad rash all over body  . Dizziness and weakness

## 2020-01-18 ENCOUNTER — Ambulatory Visit: Payer: 59

## 2020-02-08 ENCOUNTER — Ambulatory Visit: Payer: 59

## 2020-02-12 ENCOUNTER — Other Ambulatory Visit: Payer: Self-pay | Admitting: Family Medicine

## 2020-02-14 NOTE — Telephone Encounter (Signed)
Ok to refill??  Last office visit/ refill 10/22/2019, #2 refills.

## 2020-03-16 ENCOUNTER — Other Ambulatory Visit: Payer: Self-pay | Admitting: Family Medicine

## 2020-04-10 ENCOUNTER — Ambulatory Visit: Payer: 59 | Admitting: Podiatry

## 2020-04-10 ENCOUNTER — Other Ambulatory Visit: Payer: Self-pay

## 2020-04-10 DIAGNOSIS — Q828 Other specified congenital malformations of skin: Secondary | ICD-10-CM

## 2020-04-10 DIAGNOSIS — M79672 Pain in left foot: Secondary | ICD-10-CM

## 2020-04-14 NOTE — Progress Notes (Signed)
Subjective: 65 year old male presents the office today for concerns of a callus or wart on the left foot.  He states that it improved some since I last saw him just in 2020.  He denies that there is a wound that we do not hold out soften the area.  No recent injury or falls.  Denies of any foreign objects. Denies any systemic complaints such as fevers, chills, nausea, vomiting. No acute changes since last appointment, and no other complaints at this time.   Objective: AAO x3, NAD DP/PT pulses palpable bilaterally, CRT less than 3 seconds On the left foot submetatarsal areas of hyperkeratotic lesion.  Upon debridement there is no underlying ulceration drainage or any signs of infection.  No evidence of fracture, foreign body.  No other areas of discomfort identified today.  No pain with calf compression, swelling, warmth, erythema  Assessment: Skin lesion left foot, likely callus  Plan: -All treatment options discussed with the patient including all alternatives, risks, complications.  -Debrided the hyperkeratotic lesion with any complications or bleeding.  Discussed measures to help prevent reoccurrence clean moisturizer as well as offloading.  He can use a pumice stone as well. -Long-term can consider surgical intervention if needed. -Patient encouraged to call the office with any questions, concerns, change in symptoms.   Trula Slade DPM

## 2020-04-21 ENCOUNTER — Encounter: Payer: Self-pay | Admitting: Family Medicine

## 2020-04-21 ENCOUNTER — Other Ambulatory Visit: Payer: Self-pay

## 2020-04-21 ENCOUNTER — Ambulatory Visit: Payer: 59 | Admitting: Family Medicine

## 2020-04-21 VITALS — BP 134/80 | HR 80 | Temp 98.3°F | Resp 14 | Ht 74.0 in | Wt 207.0 lb

## 2020-04-21 DIAGNOSIS — G4730 Sleep apnea, unspecified: Secondary | ICD-10-CM

## 2020-04-21 DIAGNOSIS — R7989 Other specified abnormal findings of blood chemistry: Secondary | ICD-10-CM

## 2020-04-21 DIAGNOSIS — I10 Essential (primary) hypertension: Secondary | ICD-10-CM

## 2020-04-21 DIAGNOSIS — G4700 Insomnia, unspecified: Secondary | ICD-10-CM | POA: Diagnosis not present

## 2020-04-21 MED ORDER — AMLODIPINE BESYLATE 5 MG PO TABS: 5.0000 mg | ORAL_TABLET | Freq: Every day | ORAL | 1 refills | Status: DC

## 2020-04-21 MED ORDER — TESTOSTERONE CYPIONATE 200 MG/ML IM SOLN: INTRAMUSCULAR | 2 refills | Status: DC

## 2020-04-21 MED ORDER — ZOLPIDEM TARTRATE ER 12.5 MG PO TBCR: EXTENDED_RELEASE_TABLET | ORAL | 3 refills | Status: DC

## 2020-04-21 MED ORDER — HYDROCHLOROTHIAZIDE 25 MG PO TABS: 25.0000 mg | ORAL_TABLET | Freq: Every day | ORAL | 1 refills | Status: DC

## 2020-04-21 MED ORDER — METOPROLOL SUCCINATE ER 100 MG PO TB24: ORAL_TABLET | ORAL | 1 refills | Status: DC

## 2020-04-21 NOTE — Assessment & Plan Note (Signed)
Decrease to 200mg  once a day  PSA normal within the past year

## 2020-04-21 NOTE — Assessment & Plan Note (Signed)
Controlled no changes 

## 2020-04-21 NOTE — Progress Notes (Signed)
   Subjective:    Patient ID: Nathan Stewart, male    DOB: Apr 23, 1955, 65 y.o.   MRN: [id number]  Patient presents for Follow-up (Is fasting/)  Patient here follow-up chronic medical problems.  Medications reviewed. Hypertension he is taking his blood pressure medicine as prescribed amlodipine 5 mg HCTZ 25 and metoprolol 100 mg.  Chronic insomnia he continues to use Ambien to try other agents his symptoms.  Low testosterone he is on testosterone injection every 2 weeks. Due for recheck on testosterone levels and CBC He wants to start tapering off   OSA doesn't tolerate CPAP therapy  Glaucoma  Suspect /retinopathy/ cataract  he continues to follow-up with ophthalmology, no treatment needed   Dr. Clayborne Artist follows for his chronic foot pain  Dr. Tamera Punt follows for his chronic shoulder pain/ Dr. Mayer Camel performed knee surgery   He will be retiring in next month    Review Of Systems:  GEN- denies fatigue, fever, weight loss,weakness, recent illness HEENT- denies eye drainage, change in vision, nasal discharge, CVS- denies chest pain, palpitations RESP- denies SOB, cough, wheeze ABD- denies N/V, change in stools, abd pain GU- denies dysuria, hematuria, dribbling, incontinence MSK- denies joint pain, muscle aches, injury Neuro- denies headache, dizziness, syncope, seizure activity       Objective:    BP 134/80   Pulse 80   Temp 98.3 F (36.8 C) (Temporal)   Resp 14   Ht 6\' 2"  (1.88 m)   Wt 207 lb (93.9 kg)   SpO2 98%   BMI 26.58 kg/m  GEN- NAD, alert and oriented x3 HEENT- PERRL, EOMI, non injected sclera, pink conjunctiva, MMM, oropharynx clear Neck- Supple, no thyromegaly CVS- RRR, no murmur RESP-CTAB ABD-NABS,soft,NT,ND EXT- No edema Pulses- Radial, DP- 2+        Assessment & Plan:      Problem List Items Addressed This Visit      Unprioritized   Essential hypertension - Primary    Controlled no changes       Relevant Medications   metoprolol  succinate (TOPROL-XL) 100 MG 24 hr tablet   hydrochlorothiazide (HYDRODIURIL) 25 MG tablet   amLODipine (NORVASC) 5 MG tablet   Other Relevant Orders   CBC with Differential/Platelet   Comprehensive metabolic panel   Lipid panel   INSOMNIA, CHRONIC    Continue Ambien, he may not need as much as he is retiring and overall sleep habits may change       Low testosterone    Decrease to 200mg  once a day  PSA normal within the past year      Relevant Orders   Testosterone   Sleep apnea    Does not tolerate any CPAP device, untreated          Note: This dictation was prepared with Dragon dictation along with smaller phrase technology. Any transcriptional errors that result from this process are unintentional.

## 2020-04-21 NOTE — Patient Instructions (Addendum)
F/U Dr. Dennard Schaumann 6 months for Physical  Reduce to 1 shot a month of testosterone

## 2020-04-21 NOTE — Assessment & Plan Note (Signed)
Does not tolerate any CPAP device, untreated

## 2020-04-21 NOTE — Assessment & Plan Note (Signed)
Continue Ambien, he may not need as much as he is retiring and overall sleep habits may change

## 2020-04-22 LAB — COMPREHENSIVE METABOLIC PANEL
AG Ratio: 2.1 (calc) (ref 1.0–2.5)
ALT: 38 U/L (ref 9–46)
AST: 21 U/L (ref 10–35)
Albumin: 4.8 g/dL (ref 3.6–5.1)
Alkaline phosphatase (APISO): 48 U/L (ref 35–144)
BUN: 18 mg/dL (ref 7–25)
CO2: 24 mmol/L (ref 20–32)
Calcium: 10.1 mg/dL (ref 8.6–10.3)
Chloride: 101 mmol/L (ref 98–110)
Creat: 1.07 mg/dL (ref 0.70–1.25)
Globulin: 2.3 g/dL (calc) (ref 1.9–3.7)
Glucose, Bld: 105 mg/dL — ABNORMAL HIGH (ref 65–99)
Potassium: 4 mmol/L (ref 3.5–5.3)
Sodium: 135 mmol/L (ref 135–146)
Total Bilirubin: 1 mg/dL (ref 0.2–1.2)
Total Protein: 7.1 g/dL (ref 6.1–8.1)

## 2020-04-22 LAB — CBC WITH DIFFERENTIAL/PLATELET
Absolute Monocytes: 630 cells/uL (ref 200–950)
Basophils Absolute: 60 cells/uL (ref 0–200)
Basophils Relative: 0.8 %
Eosinophils Absolute: 173 cells/uL (ref 15–500)
Eosinophils Relative: 2.3 %
HCT: 48.4 % (ref 38.5–50.0)
Hemoglobin: 16.5 g/dL (ref 13.2–17.1)
Lymphs Abs: 1718 cells/uL (ref 850–3900)
MCH: 32 pg (ref 27.0–33.0)
MCHC: 34.1 g/dL (ref 32.0–36.0)
MCV: 93.8 fL (ref 80.0–100.0)
MPV: 10.9 fL (ref 7.5–12.5)
Monocytes Relative: 8.4 %
Neutro Abs: 4920 cells/uL (ref 1500–7800)
Neutrophils Relative %: 65.6 %
Platelets: 222 10*3/uL (ref 140–400)
RBC: 5.16 10*6/uL (ref 4.20–5.80)
RDW: 12.1 % (ref 11.0–15.0)
Total Lymphocyte: 22.9 %
WBC: 7.5 10*3/uL (ref 3.8–10.8)

## 2020-04-22 LAB — LIPID PANEL
Cholesterol: 158 mg/dL (ref <200)
HDL: 42 mg/dL (ref >=40)
LDL Cholesterol (Calc): 91 mg/dL (calc)
Non-HDL Cholesterol (Calc): 116 mg/dL (calc) (ref <130)
Total CHOL/HDL Ratio: 3.8 (calc) (ref <5.0)
Triglycerides: 152 mg/dL — ABNORMAL HIGH (ref <150)

## 2020-04-22 LAB — TESTOSTERONE: Testosterone: 547 ng/dL (ref 250–827)

## 2020-04-27 ENCOUNTER — Encounter: Payer: Self-pay | Admitting: *Deleted

## 2020-05-06 ENCOUNTER — Other Ambulatory Visit: Payer: Self-pay | Admitting: Family Medicine

## 2020-05-08 NOTE — Telephone Encounter (Signed)
Ok to refill??  Last office visit/ refill 04/21/2020.

## 2020-05-15 ENCOUNTER — Other Ambulatory Visit: Payer: Self-pay | Admitting: Family Medicine

## 2020-05-19 ENCOUNTER — Other Ambulatory Visit: Payer: Self-pay | Admitting: Family Medicine

## 2020-05-19 NOTE — Telephone Encounter (Signed)
Ok to refill??  Last office visit/refill 04/21/2020.  Of note, patient to F/U for CPE in August.

## 2020-05-26 ENCOUNTER — Other Ambulatory Visit: Payer: Self-pay

## 2020-05-26 ENCOUNTER — Telehealth: Payer: Self-pay | Admitting: Family Medicine

## 2020-05-26 MED ORDER — METOPROLOL SUCCINATE ER 100 MG PO TB24: ORAL_TABLET | ORAL | 1 refills | Status: DC

## 2020-05-26 MED ORDER — AMLODIPINE BESYLATE 5 MG PO TABS: 5.0000 mg | ORAL_TABLET | Freq: Every day | ORAL | 1 refills | Status: DC

## 2020-05-26 MED ORDER — HYDROCHLOROTHIAZIDE 25 MG PO TABS: 25.0000 mg | ORAL_TABLET | Freq: Every day | ORAL | 1 refills | Status: DC

## 2020-05-26 NOTE — Telephone Encounter (Signed)
Patient needs all of his medication called into [facility] on [address]. He has recently changed insurance company's and they request he goes to [facility]. He would like a 90 day supply.   CB# [phone]

## 2020-05-31 ENCOUNTER — Other Ambulatory Visit: Payer: Self-pay | Admitting: Family Medicine

## 2020-07-07 DIAGNOSIS — H40023 Open angle with borderline findings, high risk, bilateral: Secondary | ICD-10-CM | POA: Diagnosis not present

## 2020-07-07 DIAGNOSIS — H40033 Anatomical narrow angle, bilateral: Secondary | ICD-10-CM | POA: Diagnosis not present

## 2020-07-07 DIAGNOSIS — H04123 Dry eye syndrome of bilateral lacrimal glands: Secondary | ICD-10-CM | POA: Diagnosis not present

## 2020-07-25 ENCOUNTER — Other Ambulatory Visit: Payer: Self-pay

## 2020-07-25 ENCOUNTER — Ambulatory Visit: Payer: Self-pay | Attending: Internal Medicine

## 2020-07-25 DIAGNOSIS — Z23 Encounter for immunization: Secondary | ICD-10-CM

## 2020-07-25 MED ORDER — PFIZER-BIONT COVID-19 VAC-TRIS 30 MCG/0.3ML IM SUSP
INTRAMUSCULAR | 0 refills | Status: DC
Start: 2020-07-25 — End: 2020-09-11
  Filled 2020-07-25: qty 0.3, 1d supply, fill #0

## 2020-07-25 NOTE — Progress Notes (Signed)
   Covid-19 Vaccination Clinic  Name:  Nathan Stewart    MRN: [id number] DOB: 1955/03/24  07/25/2020  Nathan Stewart was observed post Covid-19 immunization for 15 minutes without incident. He was provided with Vaccine Information Sheet and instruction to access the V-Safe system.   Nathan Stewart was instructed to call 911 with any severe reactions post vaccine: Marland Kitchen Difficulty breathing  . Swelling of face and throat  . A fast heartbeat  . A bad rash all over body  . Dizziness and weakness   Immunizations Administered    Name Date Dose VIS Date Route   PFIZER Comrnaty(Gray TOP) Covid-19 Vaccine 07/25/2020  9:53 AM 0.3 mL 02/03/2020 Intramuscular   Manufacturer: [place]   Lot: [id number]   NDC: [phone]

## 2020-09-11 ENCOUNTER — Telehealth: Payer: Self-pay | Admitting: Family Medicine

## 2020-09-11 NOTE — Telephone Encounter (Signed)
Call placed to patient to inquire. LMTRC. 

## 2020-09-11 NOTE — Telephone Encounter (Signed)
Pt called in stating that his newly prescribed sleep medicine is not covered by his insurance. Pt stated that his insurance suggests that he get the generic brand of this med. Please advise  Cb#: [phone]

## 2020-09-12 ENCOUNTER — Other Ambulatory Visit: Payer: Self-pay | Admitting: Family Medicine

## 2020-09-12 ENCOUNTER — Other Ambulatory Visit: Payer: Self-pay

## 2020-09-12 ENCOUNTER — Telehealth: Payer: Self-pay

## 2020-09-12 DIAGNOSIS — I10 Essential (primary) hypertension: Secondary | ICD-10-CM

## 2020-09-12 DIAGNOSIS — R7989 Other specified abnormal findings of blood chemistry: Secondary | ICD-10-CM

## 2020-09-12 MED ORDER — ALPRAZOLAM 0.5 MG PO TABS
0.5000 mg | ORAL_TABLET | Freq: Every evening | ORAL | 0 refills | Status: DC | PRN
Start: 2020-09-12 — End: 2020-11-03

## 2020-09-12 NOTE — Telephone Encounter (Signed)
Medication changed in another message.

## 2020-09-12 NOTE — Telephone Encounter (Signed)
Medication sent.

## 2020-10-05 DIAGNOSIS — M1711 Unilateral primary osteoarthritis, right knee: Secondary | ICD-10-CM | POA: Diagnosis not present

## 2020-11-02 ENCOUNTER — Other Ambulatory Visit: Payer: Self-pay

## 2020-11-02 DIAGNOSIS — R7989 Other specified abnormal findings of blood chemistry: Secondary | ICD-10-CM | POA: Diagnosis not present

## 2020-11-02 DIAGNOSIS — I10 Essential (primary) hypertension: Secondary | ICD-10-CM | POA: Diagnosis not present

## 2020-11-03 ENCOUNTER — Ambulatory Visit (INDEPENDENT_AMBULATORY_CARE_PROVIDER_SITE_OTHER): Payer: Medicare Other | Admitting: Family Medicine

## 2020-11-03 ENCOUNTER — Encounter: Payer: Self-pay | Admitting: Family Medicine

## 2020-11-03 ENCOUNTER — Encounter: Payer: 59 | Admitting: Family Medicine

## 2020-11-03 VITALS — BP 130/68 | HR 66 | Temp 98.4°F | Resp 14 | Ht 74.0 in | Wt 208.0 lb

## 2020-11-03 DIAGNOSIS — Z0000 Encounter for general adult medical examination without abnormal findings: Secondary | ICD-10-CM

## 2020-11-03 DIAGNOSIS — Z136 Encounter for screening for cardiovascular disorders: Secondary | ICD-10-CM | POA: Diagnosis not present

## 2020-11-03 DIAGNOSIS — R7989 Other specified abnormal findings of blood chemistry: Secondary | ICD-10-CM

## 2020-11-03 DIAGNOSIS — I10 Essential (primary) hypertension: Secondary | ICD-10-CM

## 2020-11-03 DIAGNOSIS — Z87891 Personal history of nicotine dependence: Secondary | ICD-10-CM

## 2020-11-03 DIAGNOSIS — Z0001 Encounter for general adult medical examination with abnormal findings: Secondary | ICD-10-CM

## 2020-11-03 DIAGNOSIS — Z125 Encounter for screening for malignant neoplasm of prostate: Secondary | ICD-10-CM

## 2020-11-03 DIAGNOSIS — G4700 Insomnia, unspecified: Secondary | ICD-10-CM | POA: Diagnosis not present

## 2020-11-03 MED ORDER — TESTOSTERONE CYPIONATE 200 MG/ML IM SOLN: INTRAMUSCULAR | 3 refills | Status: DC

## 2020-11-03 MED ORDER — ALPRAZOLAM 0.5 MG PO TABS: 0.5000 mg | ORAL_TABLET | Freq: Every evening | ORAL | 3 refills | Status: DC | PRN

## 2020-11-03 NOTE — Progress Notes (Signed)
Subjective:    Patient ID: Nathan Stewart, male    DOB: [birth year], 65 y.o.   MRN: [id number]  HPI  Patient is a very pleasant 65 year old African-American gentleman here today for a physical exam.  He denies any falls or depression or memory loss.  He has a history of remote smoking although he quit 1999.  He is never been checked for a AAA.  He does not require lung cancer screening having quit more than 15 years ago.  He is due for a AAA screen.  Last colonoscopy was in 2018.  He is due for repeat colonoscopy in 2023 due to a tubular adenoma.  He also has a history of hypertension as well as hypogonadism.  Most recent lab work is listed below. Appointment on 11/02/2020  Component Date Value Ref Range Status  . Testosterone 11/02/2020 784  250 - 827 ng/dL Final  . WBC 11/02/2020 7.2  3.8 - 10.8 Thousand/uL Final  . RBC 11/02/2020 5.31  4.20 - 5.80 Million/uL Final  . Hemoglobin 11/02/2020 17.2 (A) 13.2 - 17.1 g/dL Final  . HCT 11/02/2020 50.1 (A) 38.5 - 50.0 % Final  . MCV 11/02/2020 94.4  80.0 - 100.0 fL Final  . MCH 11/02/2020 32.4  27.0 - 33.0 pg Final  . MCHC 11/02/2020 34.3  32.0 - 36.0 g/dL Final  . RDW 11/02/2020 12.5  11.0 - 15.0 % Final  . Platelets 11/02/2020 199  140 - 400 Thousand/uL Final  . MPV 11/02/2020 11.0  7.5 - 12.5 fL Final  . Neutro Abs 11/02/2020 4,788  1,500 - 7,800 cells/uL Final  . Lymphs Abs 11/02/2020 1,505  850 - 3,900 cells/uL Final  . Absolute Monocytes 11/02/2020 619  200 - 950 cells/uL Final  . Eosinophils Absolute 11/02/2020 238  15 - 500 cells/uL Final  . Basophils Absolute 11/02/2020 50  0 - 200 cells/uL Final  . Neutrophils Relative % 11/02/2020 66.5  % Final  . Total Lymphocyte 11/02/2020 20.9  % Final  . Monocytes Relative 11/02/2020 8.6  % Final  . Eosinophils Relative 11/02/2020 3.3  % Final  . Basophils Relative 11/02/2020 0.7  % Final  . Glucose, Bld 11/02/2020 103 (A) 65 - 99 mg/dL Final   Comment: .            Fasting reference  interval . For someone without known diabetes, a glucose value between 100 and 125 mg/dL is consistent with prediabetes and should be confirmed with a follow-up test. .   . BUN 11/02/2020 18  7 - 25 mg/dL Final  . Creat 11/02/2020 0.95  0.70 - 1.35 mg/dL Final  . eGFR 11/02/2020 89  > OR = 60 mL/min/1.21m Final   Comment: The eGFR is based on the CKD-EPI 2021 equation. To calculate  the new eGFR from a previous Creatinine or Cystatin C result, go to https://www.kidney.org/professionals/ kdoqi/gfr%5Fcalculator   . BUN/Creatinine Ratio 056/43/3295NOT APPLICABLE  6 - 22 (calc) Final  . Sodium 11/02/2020 136  135 - 146 mmol/L Final  . Potassium 11/02/2020 3.9  3.5 - 5.3 mmol/L Final  . Chloride 11/02/2020 101  98 - 110 mmol/L Final  . CO2 11/02/2020 22  20 - 32 mmol/L Final  . Calcium 11/02/2020 9.8  8.6 - 10.3 mg/dL Final  . Total Protein 11/02/2020 7.1  6.1 - 8.1 g/dL Final  . Albumin 11/02/2020 4.7  3.6 - 5.1 g/dL Final  . Globulin 11/02/2020 2.4  1.9 - 3.7 g/dL (calc) Final  . AG  Ratio 11/02/2020 2.0  1.0 - 2.5 (calc) Final  . Total Bilirubin 11/02/2020 1.0  0.2 - 1.2 mg/dL Final  . Alkaline phosphatase (APISO) 11/02/2020 51  35 - 144 U/L Final  . AST 11/02/2020 26  10 - 35 U/L Final  . ALT 11/02/2020 44  9 - 46 U/L Final  . Cholesterol 11/02/2020 179  <200 mg/dL Final  . HDL 11/02/2020 42  > OR = 40 mg/dL Final  . Triglycerides 11/02/2020 146  <150 mg/dL Final  . LDL Cholesterol (Calc) 11/02/2020 111 (A) mg/dL (calc) Final   Comment: Reference range: <100 . Desirable range <100 mg/dL for primary prevention;   <70 mg/dL for patients with CHD or diabetic patients  with > or = 2 CHD risk factors. Marland Kitchen LDL-C is now calculated using the Martin-Hopkins  calculation, which is a validated novel method providing  better accuracy than the Friedewald equation in the  estimation of LDL-C.  Cresenciano Genre et al. Annamaria Helling. 3532;992(42): 2061-2068   (http://education.[facility].com/faq/FAQ164)   . Total CHOL/HDL Ratio 11/02/2020 4.3  <5.0 (calc) Final  . Non-HDL Cholesterol (Calc) 11/02/2020 137 (A) <130 mg/dL (calc) Final   Comment: For patients with diabetes plus 1 major ASCVD risk  factor, treating to a non-HDL-C goal of <100 mg/dL  (LDL-C of <70 mg/dL) is considered a therapeutic  option.    Past Medical History:  Diagnosis Date  . Allergy    seasonal  . 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 GEN OSTEOARTHROSIS INVOLVING MULTIPLE SITES 11/10/2007  . GERD 01/01/2007  . Hx of adenomatous polyp of colon 04/04/2016  . Hypertension   . INSOMNIA, CHRONIC 11/13/2009  . LIVER FUNCTION TESTS, ABNORMAL 08/27/2007  . LOC OSTEOARTHROS NOT SPEC PRIM/SEC LOWER LEG 11/10/2007  . Memory loss 08/27/2007  . MENISCUS TEAR 10/30/2006  . OSTEOARTHRITIS 06/07/2008  . Pneumonia   . SLEEP APNEA 09/20/2009  . TESTICULAR HYPOFUNCTION 08/14/2009   Past Surgical History:  Procedure Laterality Date  . COLONOSCOPY    . HAND SURGERY  over 20 years ago   'metal plate in wrist'  . JOINT REPLACEMENT N/A    Phreesia 10/19/2019  . MULTIPLE TOOTH EXTRACTIONS    . SHOULDER SURGERY Right 2021  . 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 amLODipine (NORVASC) 5 MG tablet Take 1 tablet (5 mg total) by mouth daily. 90 tablet 1  . Bioflavonoid Products (VITAMIN C PLUS) 1000 MG TABS Take by mouth. Vitamin C, Zinc, Elderberry    . Glucosamine 500 MG CAPS Take 2 capsules by mouth daily.     . hydrochlorothiazide (HYDRODIURIL) 25 MG tablet TAKE 1 TABLET BY MOUTH EVERY DAY 90 tablet 1  . metoprolol succinate (TOPROL-XL) 100 MG 24 hr tablet Take with or immediately following a meal. 90 tablet 1  . Multiple Vitamin (MULTIVITAMIN) tablet Take 1 tablet by  mouth daily.    . Omega-3 Fatty Acids (FISH OIL) 1000 MG CPDR Take by mouth.    . Turmeric (QC TUMERIC COMPLEX PO) Take by mouth.     Current Facility-Administered Medications on File Prior to Visit  Medication Dose Route Frequency Provider Last Rate Last Admin  . 0.9 %  sodium chloride infusion  500 mL Intravenous Continuous Gatha Mayer, MD       .all Social History  Socioeconomic History  . Marital status: Married    Spouse name: Not on file  . Number of children: Not on file  . Years of education: Not on file  . Highest education level: Not on file  Occupational History  . Not on file  Tobacco Use  . Smoking status: Former    Types: Cigarettes    Quit date: 02/25/1997    Years since quitting: 23.7  . Smokeless tobacco: Never  . Tobacco comments:    quit 1999  Substance and Sexual Activity  . Alcohol use: Yes    Alcohol/week: 6.0 standard drinks    Types: 6 Cans of beer per week    Comment: occasional  . Drug use: No  . Sexual activity: Yes  Other Topics Concern  . Not on file  Social History Narrative  . Not on file   Social Determinants of Health   Financial Resource Strain: Not on file  Food Insecurity: Not on file  Transportation Needs: Not on file  Physical Activity: Not on file  Stress: Not on file  Social Connections: Not on file  Intimate Partner Violence: Not on file   Family History  Problem Relation Age of Onset  . Arthritis Mother   . Heart disease Mother   . Hypertension Mother   . Hypertension Father   . Stroke Father   . Hypertension Sister   . Stroke Brother   . Colon cancer Paternal Uncle       Review of Systems  All other systems reviewed and are negative.     Objective:   Physical Exam Vitals reviewed.  Constitutional:      General: He is not in acute distress.    Appearance: Normal appearance. He is normal weight. He is not ill-appearing or toxic-appearing.  HENT:     Head: Normocephalic and atraumatic.     Right Ear:  Tympanic membrane and ear canal normal.     Left Ear: Tympanic membrane and ear canal normal.     Nose: Nose normal. No congestion or rhinorrhea.     Mouth/Throat:     Mouth: Mucous membranes are moist.     Pharynx: Oropharynx is clear. No oropharyngeal exudate or posterior oropharyngeal erythema.  Eyes:     Extraocular Movements: Extraocular movements intact.     Conjunctiva/sclera: Conjunctivae normal.     Pupils: Pupils are equal, round, and reactive to light.  Neck:     Vascular: No carotid bruit.  Cardiovascular:     Rate and Rhythm: Normal rate and regular rhythm.     Pulses: Normal pulses.     Heart sounds: Normal heart sounds. No murmur heard.   No friction rub. No gallop.  Pulmonary:     Effort: Pulmonary effort is normal. No respiratory distress.     Breath sounds: Normal breath sounds. No wheezing, rhonchi or rales.  Abdominal:     General: Abdomen is flat. Bowel sounds are normal. There is no distension.     Palpations: Abdomen is soft.     Tenderness: There is no abdominal tenderness. There is no rebound.     Hernia: No hernia is present.  Musculoskeletal:     Cervical back: Neck supple.     Right lower leg: No edema.     Left lower leg: No edema.  Lymphadenopathy:     Cervical: No cervical adenopathy.  Skin:    Coloration: Skin is not jaundiced or pale.     Findings: No bruising, erythema, lesion or  rash.  Neurological:     General: No focal deficit present.     Mental Status: He is alert and oriented to person, place, and time. Mental status is at baseline.     Cranial Nerves: No cranial nerve deficit.     Motor: No weakness.     Gait: Gait normal.  Psychiatric:        Mood and Affect: Mood normal.        Behavior: Behavior normal.        Thought Content: Thought content normal.        Judgment: Judgment normal.          Assessment & Plan:  Screening for AAA (abdominal aortic aneurysm) - Plan: VAS [place] AAA DUPLEX  Essential hypertension  Low  testosterone  Routine general medical examination at a health care facility  Prostate cancer screening  INSOMNIA, CHRONIC  Former smoker I will set the patient up for a AAA screen given his history of smoking, age, and hypertension.  His most recent lab work shows a normal testosterone level.  Therefore I will make no changes in his testosterone dose.  We did discuss the increased risk of prostate cancer and heart disease and he is willing to accept these risk because the medicine makes him feel better and gives him energy.  His blood pressure today is well controlled at 130/68.  His cholesterol is elevated and his 10-year risk of cardiovascular disease is about 17%.  Therefore I recommended a moderate intensity statin but he politely declined this.  I will check a PSA to screen for prostate cancer.  I will continue to prescribe Xanax 0.5 mg p.o. nightly for insomnia.  Colonoscopy is up-to-date until next year.  We reviewed his vaccinations and I recommended the high-dose flu shot as well as the new COVID vaccination.  Patient declines the shingles vaccination due to a previous reaction.  We will defer Prevnar and Pneumovax at the present time

## 2020-11-04 LAB — COMPLETE METABOLIC PANEL WITH GFR
AG Ratio: 2 (calc) (ref 1.0–2.5)
ALT: 44 U/L (ref 9–46)
AST: 26 U/L (ref 10–35)
Albumin: 4.7 g/dL (ref 3.6–5.1)
Alkaline phosphatase (APISO): 51 U/L (ref 35–144)
BUN: 18 mg/dL (ref 7–25)
CO2: 22 mmol/L (ref 20–32)
Calcium: 9.8 mg/dL (ref 8.6–10.3)
Chloride: 101 mmol/L (ref 98–110)
Creat: 0.95 mg/dL (ref 0.70–1.35)
Globulin: 2.4 g/dL (calc) (ref 1.9–3.7)
Glucose, Bld: 103 mg/dL — ABNORMAL HIGH (ref 65–99)
Potassium: 3.9 mmol/L (ref 3.5–5.3)
Sodium: 136 mmol/L (ref 135–146)
Total Bilirubin: 1 mg/dL (ref 0.2–1.2)
Total Protein: 7.1 g/dL (ref 6.1–8.1)
eGFR: 89 mL/min/{1.73_m2} (ref >=60)

## 2020-11-04 LAB — CBC WITH DIFFERENTIAL/PLATELET
Absolute Monocytes: 619 cells/uL (ref 200–950)
Basophils Absolute: 50 cells/uL (ref 0–200)
Basophils Relative: 0.7 %
Eosinophils Absolute: 238 cells/uL (ref 15–500)
Eosinophils Relative: 3.3 %
HCT: 50.1 % — ABNORMAL HIGH (ref 38.5–50.0)
Hemoglobin: 17.2 g/dL — ABNORMAL HIGH (ref 13.2–17.1)
Lymphs Abs: 1505 cells/uL (ref 850–3900)
MCH: 32.4 pg (ref 27.0–33.0)
MCHC: 34.3 g/dL (ref 32.0–36.0)
MCV: 94.4 fL (ref 80.0–100.0)
MPV: 11 fL (ref 7.5–12.5)
Monocytes Relative: 8.6 %
Neutro Abs: 4788 cells/uL (ref 1500–7800)
Neutrophils Relative %: 66.5 %
Platelets: 199 10*3/uL (ref 140–400)
RBC: 5.31 10*6/uL (ref 4.20–5.80)
RDW: 12.5 % (ref 11.0–15.0)
Total Lymphocyte: 20.9 %
WBC: 7.2 10*3/uL (ref 3.8–10.8)

## 2020-11-04 LAB — LIPID PANEL
Cholesterol: 179 mg/dL (ref <200)
HDL: 42 mg/dL (ref >=40)
LDL Cholesterol (Calc): 111 mg/dL (calc) — ABNORMAL HIGH
Non-HDL Cholesterol (Calc): 137 mg/dL (calc) — ABNORMAL HIGH (ref <130)
Total CHOL/HDL Ratio: 4.3 (calc) (ref <5.0)
Triglycerides: 146 mg/dL (ref <150)

## 2020-11-04 LAB — TEST AUTHORIZATION

## 2020-11-04 LAB — TESTOSTERONE: Testosterone: 784 ng/dL (ref 250–827)

## 2020-11-04 LAB — PSA: PSA: 0.91 ng/mL (ref <=4.00)

## 2020-11-06 ENCOUNTER — Encounter: Payer: Self-pay | Admitting: *Deleted

## 2020-11-08 ENCOUNTER — Other Ambulatory Visit: Payer: Self-pay | Admitting: *Deleted

## 2020-11-08 MED ORDER — AMLODIPINE BESYLATE 5 MG PO TABS: 5.0000 mg | ORAL_TABLET | Freq: Every day | ORAL | 1 refills | Status: DC

## 2020-11-10 ENCOUNTER — Ambulatory Visit (HOSPITAL_COMMUNITY)
Admission: RE | Admit: 2020-11-10 | Discharge: 2020-11-10 | Disposition: A | Discharge status: Home / Self Care | Payer: Medicare Other | Source: Ambulatory Visit | Attending: Family Medicine | Admitting: Family Medicine

## 2020-11-10 ENCOUNTER — Other Ambulatory Visit: Payer: Self-pay

## 2020-11-10 DIAGNOSIS — Z136 Encounter for screening for cardiovascular disorders: Secondary | ICD-10-CM

## 2020-11-24 ENCOUNTER — Other Ambulatory Visit: Payer: Self-pay

## 2020-11-24 ENCOUNTER — Telehealth: Payer: Self-pay | Admitting: Family Medicine

## 2020-11-24 MED ORDER — HYDROCHLOROTHIAZIDE 25 MG PO TABS: 25.0000 mg | ORAL_TABLET | Freq: Every day | ORAL | 0 refills | Status: DC

## 2020-11-24 NOTE — Telephone Encounter (Signed)
Pharmacy:[facility] [address]   Medication:hydrochlorothiazide 25 mg tablets  Qty:90  LDJ:TTSV 1 tablet by mouth daily  Physician:Pickard

## 2020-11-26 ENCOUNTER — Other Ambulatory Visit: Payer: Self-pay | Admitting: Family Medicine

## 2020-12-13 DIAGNOSIS — M1711 Unilateral primary osteoarthritis, right knee: Secondary | ICD-10-CM | POA: Diagnosis not present

## 2020-12-19 DIAGNOSIS — M1711 Unilateral primary osteoarthritis, right knee: Secondary | ICD-10-CM | POA: Diagnosis not present

## 2020-12-19 DIAGNOSIS — M25461 Effusion, right knee: Secondary | ICD-10-CM | POA: Diagnosis not present

## 2020-12-21 ENCOUNTER — Ambulatory Visit (INDEPENDENT_AMBULATORY_CARE_PROVIDER_SITE_OTHER): Payer: Medicare Other | Admitting: *Deleted

## 2020-12-21 ENCOUNTER — Other Ambulatory Visit: Payer: Self-pay

## 2020-12-21 DIAGNOSIS — Z23 Encounter for immunization: Secondary | ICD-10-CM

## 2020-12-26 DIAGNOSIS — M1711 Unilateral primary osteoarthritis, right knee: Secondary | ICD-10-CM | POA: Diagnosis not present

## 2021-01-01 ENCOUNTER — Other Ambulatory Visit (HOSPITAL_COMMUNITY): Payer: Self-pay

## 2021-01-02 DIAGNOSIS — M1711 Unilateral primary osteoarthritis, right knee: Secondary | ICD-10-CM | POA: Diagnosis not present

## 2021-01-05 ENCOUNTER — Ambulatory Visit: Payer: Medicare Other | Attending: Internal Medicine

## 2021-01-05 ENCOUNTER — Other Ambulatory Visit (HOSPITAL_BASED_OUTPATIENT_CLINIC_OR_DEPARTMENT_OTHER): Payer: Self-pay

## 2021-01-05 ENCOUNTER — Other Ambulatory Visit: Payer: Self-pay

## 2021-01-05 DIAGNOSIS — Z23 Encounter for immunization: Secondary | ICD-10-CM

## 2021-01-05 MED ORDER — PFIZER COVID-19 VAC BIVALENT 30 MCG/0.3ML IM SUSP
INTRAMUSCULAR | 0 refills | Status: DC
  Filled 2021-01-05: qty 0.3, 1d supply, fill #0

## 2021-01-05 NOTE — Progress Notes (Signed)
   Covid-19 Vaccination Clinic  Name:  EDSON DERIDDER    MRN: [id number] DOB: 04-Oct-1955  01/05/2021  Mr. Campoy was observed post Covid-19 immunization for 15 minutes without incident. He was provided with Vaccine Information Sheet and instruction to access the V-Safe system.   Mr. Forcier was instructed to call 911 with any severe reactions post vaccine: Difficulty breathing  Swelling of face and throat  A fast heartbeat  A bad rash all over body  Dizziness and weakness   Immunizations Administered     Name Date Dose VIS Date Route   Pfizer Covid-19 Vaccine Bivalent Booster 01/05/2021 12:57 PM 0.3 mL 10/25/2020 Intramuscular   Manufacturer: [place]   Lot: [id number]   [place]: [phone]

## 2021-01-09 DIAGNOSIS — H2513 Age-related nuclear cataract, bilateral: Secondary | ICD-10-CM | POA: Diagnosis not present

## 2021-01-09 DIAGNOSIS — H40033 Anatomical narrow angle, bilateral: Secondary | ICD-10-CM | POA: Diagnosis not present

## 2021-01-09 DIAGNOSIS — H25013 Cortical age-related cataract, bilateral: Secondary | ICD-10-CM | POA: Diagnosis not present

## 2021-01-09 DIAGNOSIS — H40023 Open angle with borderline findings, high risk, bilateral: Secondary | ICD-10-CM | POA: Diagnosis not present

## 2021-01-22 ENCOUNTER — Other Ambulatory Visit: Payer: Self-pay

## 2021-01-22 ENCOUNTER — Telehealth: Payer: Self-pay | Admitting: Family Medicine

## 2021-01-22 MED ORDER — METOPROLOL SUCCINATE ER 100 MG PO TB24: ORAL_TABLET | ORAL | 1 refills | Status: DC

## 2021-01-22 NOTE — Telephone Encounter (Signed)
Patient requesting a 3 month supply and he is out completely  of metoprolol succinate (TOPROL-XL) 100 MG 24 hr tablet

## 2021-03-20 ENCOUNTER — Other Ambulatory Visit: Payer: Self-pay | Admitting: Family Medicine

## 2021-04-03 ENCOUNTER — Other Ambulatory Visit: Payer: Self-pay | Admitting: Family Medicine

## 2021-04-03 NOTE — Telephone Encounter (Signed)
Xanax refill request.  Last seen 11/03/2020, last filled 11/03/2020.

## 2021-04-12 DIAGNOSIS — M1711 Unilateral primary osteoarthritis, right knee: Secondary | ICD-10-CM | POA: Diagnosis not present

## 2021-04-19 ENCOUNTER — Other Ambulatory Visit: Payer: Self-pay | Admitting: Orthopedic Surgery

## 2021-04-20 ENCOUNTER — Telehealth: Payer: Self-pay | Admitting: Family Medicine

## 2021-04-20 NOTE — Telephone Encounter (Signed)
Pharmacy has been added as requested.

## 2021-04-20 NOTE — Telephone Encounter (Signed)
Patient came to office to request for pharmacy to be permanently changed to [facility], Pharmacist [phone], fax # [phone].  Please advise at [phone].

## 2021-05-10 ENCOUNTER — Other Ambulatory Visit: Payer: Self-pay | Admitting: Family Medicine

## 2021-05-10 NOTE — Progress Notes (Addendum)
COVID Vaccine Completed: yes x4 ?Date COVID Vaccine completed: 05/16/19, 06/06/19 ?Has received booster: 12/31/19, 07/25/20 ?COVID vaccine manufacturer: Pfizer     ? ?Date of COVID positive in last 90 days: no ? ?PCP - Jenna Luo, MD ?Cardiologist - n/a ? ?Chest x-ray - 05/11/21 Epic ?EKG - 05/11/21 Epic/chart ?Stress Test - n/a ?ECHO - n/a ?Cardiac Cath - n/a ?Pacemaker/ICD device last checked: n/a ?Spinal Cord Stimulator: n/a ? ?Bowel Prep - no ? ?Sleep Study - yes, positive ?CPAP - no ? ?Fasting Blood Sugar - n/a ?Checks Blood Sugar _____ times a day ? ?Blood Thinner Instructions: n/a ?Aspirin Instructions: ?Last Dose: ? ?Activity level: Can go up a flight of stairs and perform activities of daily living without stopping and without symptoms of chest pain or shortness of breath. ?    ? ?Anesthesia review:  ? ?Patient denies shortness of breath, fever, cough and chest pain at PAT appointment ? ? ?Patient verbalized understanding of instructions that were given to them at the PAT appointment. Patient was also instructed that they will need to review over the PAT instructions again at home before surgery.  ?

## 2021-05-10 NOTE — Patient Instructions (Addendum)
DUE TO COVID-19 ONLY ONE VISITOR  (aged 66 and older)  IS ALLOWED TO COME WITH YOU AND STAY IN THE WAITING ROOM ONLY DURING PRE OP AND PROCEDURE.   ?**NO VISITORS ARE ALLOWED IN THE SHORT STAY AREA OR RECOVERY ROOM!!** ? ? Your procedure is scheduled on: 05/21/21 ? ? Report to [facility] Main Entrance ? ?  Report to admitting at 5:15 AM ? ? Call this number if you have problems the morning of surgery [phone] ? ? Do not eat food :After Midnight. ? ? After Midnight you may have the following liquids until 4:15 AM DAY OF SURGERY ? ?Water ?Black Coffee (sugar ok, NO MILK/CREAM OR CREAMERS)  ?Tea (sugar ok, NO MILK/CREAM OR CREAMERS) regular and decaf                             ?Plain Jell-O (NO RED)                                           ?Fruit ices (not with fruit pulp, NO RED)                                     ?Popsicles (NO RED)                                                                  ?Juice: apple, WHITE grape, WHITE cranberry ?Sports drinks like Gatorade (NO RED) ?Clear broth(vegetable,chicken,beef) ?  ?The day of surgery:  ?Drink ONE (1) Pre-Surgery Clear Ensure at 4:15 AM the morning of surgery. Drink in one sitting. Do not sip.  ?This drink was given to you during your hospital  ?pre-op appointment visit. ?Nothing else to drink after completing the  ?Pre-Surgery Clear Ensure. ?  ?       If you have questions, please contact your surgeon?s office. ? ? ?FOLLOW BOWEL PREP AND ANY ADDITIONAL PRE OP INSTRUCTIONS YOU RECEIVED FROM YOUR SURGEON'S OFFICE!!! ?  ?  ?Oral Hygiene is also important to reduce your risk of infection.                                    ?Remember - BRUSH YOUR TEETH THE MORNING OF SURGERY WITH YOUR REGULAR TOOTHPASTE ? ? Take these medicines the morning of surgery with A SIP OF WATER: Amlodipine, Metoprolol.  ?                  ?           You may not have any metal on your body including jewelry, and body piercing ? ?           Do not wear lotions, powders, [place],  or deodorant ? ?Do not shave  48 hours prior to surgery.  ? ? Do not bring valuables to the hospital. [place] NOT ?            RESPONSIBLE   FOR  VALUABLES. ?  ? Patients discharged on the day of surgery will not be allowed to drive home.  Someone NEEDS to stay with you for the first 24 hours after anesthesia. ? ? Special Instructions: Bring a copy of your healthcare power of attorney and living will documents         the day of surgery if you haven't scanned them before. ? ?            Please read over the following fact sheets you were given: IF [place] [phone]- Apolonio Schneiders ? ?   [place] - Preparing for Surgery ?Before surgery, you can play an important role.  Because skin is not sterile, your skin needs to be as free of germs as possible.  You can reduce the number of germs on your skin by washing with CHG (chlorahexidine gluconate) soap before surgery.  CHG is an antiseptic cleaner which kills germs and bonds with the skin to continue killing germs even after washing. ?Please DO NOT use if you have an allergy to CHG or antibacterial soaps.  If your skin becomes reddened/irritated stop using the CHG and inform your nurse when you arrive at Short Stay. ?Do not shave (including legs and underarms) for at least 48 hours prior to the first CHG shower.  You may shave your face/neck. ? ?Please follow these instructions carefully: ? 1.  Shower with CHG Soap the night before surgery and the  morning of surgery. ? 2.  If you choose to wash your hair, wash your hair first as usual with your normal  shampoo. ? 3.  After you shampoo, rinse your hair and body thoroughly to remove the shampoo.                            ? 4.  Use CHG as you would any other liquid soap.  You can apply chg directly to the skin and wash.  Gently with a scrungie or clean washcloth. ? 5.  Apply the CHG Soap to your body ONLY FROM THE NECK DOWN.   Do   not use on face/ open      ?                      Wound or open sores. Avoid contact with eyes, ears mouth and   genitals (private parts).  ?                     Production manager,  Genitals (private parts) with your normal soap. ?            6.  Wash thoroughly, paying special attention to the area where your    surgery  will be performed. ? 7.  Thoroughly rinse your body with warm water from the neck down. ? 8.  DO NOT shower/wash with your normal soap after using and rinsing off the CHG Soap. ?               9.  Pat yourself dry with a clean towel. ?           10.  Wear clean pajamas. ?           11.  Place clean sheets on your bed the night of your first shower and do not  sleep with pets. ?Day of Surgery : ?Do not apply any lotions/deodorants the  morning of surgery.  Please wear clean clothes to the hospital/surgery center. ? ?FAILURE TO FOLLOW THESE INSTRUCTIONS MAY RESULT IN THE CANCELLATION OF YOUR SURGERY ? ?PATIENT SIGNATURE_________________________________ ? ?NURSE SIGNATURE__________________________________ ? ?________________________________________________________________________  ? ?Incentive Spirometer ? ?An incentive spirometer is a tool that can help keep your lungs clear and active. This tool measures how well you are filling your lungs with each breath. Taking long deep breaths may help reverse or decrease the chance of developing breathing (pulmonary) problems (especially infection) following: ?A long period of time when you are unable to move or be active. ?BEFORE THE PROCEDURE  ?If the spirometer includes an indicator to show your best effort, your nurse or respiratory therapist will set it to a desired goal. ?If possible, sit up straight or lean slightly forward. Try not to slouch. ?Hold the incentive spirometer in an upright position. ?INSTRUCTIONS FOR USE  ?Sit on the edge of your bed if possible, or sit up as far as you can in bed or on a chair. ?Hold the incentive spirometer in an upright position. ?Breathe out normally. ?Place the  mouthpiece in your mouth and seal your lips tightly around it. ?Breathe in slowly and as deeply as possible, raising the piston or the ball toward the top of the column. ?Hold your breath for 3-5 seconds or for as long as possible. Allow the piston or ball to fall to the bottom of the column. ?Remove the mouthpiece from your mouth and breathe out normally. ?Rest for a few seconds and repeat Steps 1 through 7 at least 10 times every 1-2 hours when you are awake. Take your time and take a few normal breaths between deep breaths. ?The spirometer may include an indicator to show your best effort. Use the indicator as a goal to work toward during each repetition. ?After each set of 10 deep breaths, practice coughing to be sure your lungs are clear. If you have an incision (the cut made at the time of surgery), support your incision when coughing by placing a pillow or rolled up towels firmly against it. ?Once you are able to get out of bed, walk around indoors and cough well. You may stop using the incentive spirometer when instructed by your caregiver.  ?RISKS AND COMPLICATIONS ?Take your time so you do not get dizzy or light-headed. ?If you are in pain, you may need to take or ask for pain medication before doing incentive spirometry. It is harder to take a deep breath if you are having pain. ?AFTER USE ?Rest and breathe slowly and easily. ?It can be helpful to keep track of a log of your progress. Your caregiver can provide you with a simple table to help with this. ?If you are using the spirometer at home, follow these instructions: ?SEEK MEDICAL CARE IF:  ?You are having difficultly using the spirometer. ?You have trouble using the spirometer as often as instructed. ?Your pain medication is not giving enough relief while using the spirometer. ?You develop fever of 100.5? F (38.1? C) or higher. ?SEEK IMMEDIATE MEDICAL CARE IF:  ?You cough up bloody sputum that had not been present before. ?You develop fever of 102? F  (38.9? C) or greater. ?You develop worsening pain at or near the incision site. ?MAKE SURE YOU:  ?Understand these instructions. ?Will watch your condition. ?Will get help right away if you are not

## 2021-05-11 ENCOUNTER — Encounter (HOSPITAL_COMMUNITY)
Admission: RE | Admit: 2021-05-11 | Discharge: 2021-05-11 | Disposition: A | Discharge status: Home / Self Care | Payer: Medicare Other | Source: Ambulatory Visit | Attending: Orthopedic Surgery | Admitting: Orthopedic Surgery

## 2021-05-11 ENCOUNTER — Other Ambulatory Visit: Payer: Self-pay

## 2021-05-11 ENCOUNTER — Ambulatory Visit (HOSPITAL_COMMUNITY)
Admission: RE | Admit: 2021-05-11 | Discharge: 2021-05-11 | Disposition: A | Discharge status: Home / Self Care | Payer: Medicare Other | Source: Ambulatory Visit | Attending: Orthopedic Surgery | Admitting: Orthopedic Surgery

## 2021-05-11 ENCOUNTER — Encounter (HOSPITAL_COMMUNITY): Payer: Self-pay

## 2021-05-11 VITALS — BP 144/93 | HR 79 | Temp 98.5°F | Resp 16 | Ht 74.0 in | Wt 211.0 lb

## 2021-05-11 DIAGNOSIS — M47814 Spondylosis without myelopathy or radiculopathy, thoracic region: Secondary | ICD-10-CM | POA: Diagnosis not present

## 2021-05-11 DIAGNOSIS — I10 Essential (primary) hypertension: Secondary | ICD-10-CM | POA: Diagnosis not present

## 2021-05-11 DIAGNOSIS — Z01818 Encounter for other preprocedural examination: Secondary | ICD-10-CM | POA: Diagnosis not present

## 2021-05-11 DIAGNOSIS — M954 Acquired deformity of chest and rib: Secondary | ICD-10-CM | POA: Diagnosis not present

## 2021-05-11 LAB — BASIC METABOLIC PANEL
Anion gap: 8 (ref 5–15)
BUN: 25 mg/dL — ABNORMAL HIGH (ref 8–23)
CO2: 23 mmol/L (ref 22–32)
Calcium: 9.5 mg/dL (ref 8.9–10.3)
Chloride: 100 mmol/L (ref 98–111)
Creatinine, Ser: 0.82 mg/dL (ref 0.61–1.24)
GFR, Estimated: >60 mL/min (ref >60)
Glucose, Bld: 123 mg/dL — ABNORMAL HIGH (ref 70–99)
Potassium: 3.6 mmol/L (ref 3.5–5.1)
Sodium: 131 mmol/L — ABNORMAL LOW (ref 135–145)

## 2021-05-11 LAB — SURGICAL PCR SCREEN
MRSA, PCR: NEGATIVE
Staphylococcus aureus: POSITIVE — AB

## 2021-05-11 LAB — CBC
HCT: 38.7 % — ABNORMAL LOW (ref 39.0–52.0)
Hemoglobin: 13.6 g/dL (ref 13.0–17.0)
MCH: 32 pg (ref 26.0–34.0)
MCHC: 35.1 g/dL (ref 30.0–36.0)
MCV: 91.1 fL (ref 80.0–100.0)
Platelets: 263 10*3/uL (ref 150–400)
RBC: 4.25 MIL/uL (ref 4.22–5.81)
RDW: 11.8 % (ref 11.5–15.5)
WBC: 8.2 10*3/uL (ref 4.0–10.5)
nRBC: 0 % (ref 0.0–0.2)

## 2021-05-11 NOTE — Telephone Encounter (Signed)
LOV 11/03/20 ?Last refill 04/03/21, #30, 0 refills ? ?Please review, thanks! ? ?

## 2021-05-11 NOTE — Progress Notes (Signed)
PCR STAPH +, results routed to Dr. Mayer Camel. ?

## 2021-05-14 NOTE — Care Plan (Signed)
Ortho Bundle Case Management Note ? ?Patient Details  ?Name: Nathan Stewart ?MRN: [id number] ?Date of Birth: 08/28/1955 ? ? Spoke with patient prior to surgery. He will discharge to home with family to assist. Has a RW at home. OPPT set up with [place]. Patient and MD in agreement with plan. Choice offered                ? ? ? ?DME Arranged:    ?DME Agency:    ? ?HH Arranged:    ?[place] Agency:    ? ?Additional Comments: ?Please contact me with any questions of if this plan should need to change. ? ?Mardelle Matte  [facility]ist  [phone] ?05/14/2021, 2:30 PM ?  ?

## 2021-05-18 DIAGNOSIS — M1711 Unilateral primary osteoarthritis, right knee: Secondary | ICD-10-CM | POA: Diagnosis present

## 2021-05-18 NOTE — H&P (Signed)
TOTAL KNEE ADMISSION H&P ? ?Patient is being admitted for right total knee arthroplasty. ? ?Subjective: ? ?Chief Complaint:right knee pain. ? ?HPI: Nathan Stewart, 66 y.o. male, has a history of pain and functional disability in the right knee due to arthritis and has failed non-surgical conservative treatments for greater than 12 weeks to includeNSAID's and/or analgesics, corticosteriod injections, viscosupplementation injections, and activity modification.  Onset of symptoms was gradual, starting >10 years ago with gradually worsening course since that time. The patient noted no past surgery on the right knee(s).  Patient currently rates pain in the right knee(s) at 10 out of 10 with activity. Patient has night pain, worsening of pain with activity and weight bearing, pain that interferes with activities of daily living, pain with passive range of motion, crepitus, and joint swelling.  Patient has evidence of subchondral sclerosis, periarticular osteophytes, and joint space narrowing by imaging studies.  There is no active infection. ? ?Patient Active Problem List  ? Diagnosis Date Noted  ? Osteoarthritis of right knee 05/18/2021  ? Hx of adenomatous polyp of colon 04/04/2016  ? Cataract of both eyes 09/28/2015  ? Retinopathy, hypertensive, both eyes 09/28/2015  ? Glaucoma of both eyes 09/28/2015  ? Primary osteoarthritis of left knee 11/27/2014  ? INSOMNIA, CHRONIC 11/13/2009  ? Sleep apnea 09/20/2009  ? Low testosterone 08/14/2009  ? OSTEOARTHRITIS 06/07/2008  ? GEN OSTEOARTHROSIS INVOLVING MULTIPLE SITES 11/10/2007  ? GERD 01/01/2007  ? Essential hypertension 01/01/2007  ? ?Past Medical History:  ?Diagnosis Date  ? Allergy   ? seasonal  ? 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 GEN OSTEOARTHROSIS INVOLVING MULTIPLE SITES 11/10/2007  ? GERD 01/01/2007  ? Hx of adenomatous polyp of colon 04/04/2016  ? Hypertension   ? INSOMNIA, CHRONIC 11/13/2009  ? LIVER FUNCTION TESTS, ABNORMAL 08/27/2007  ? LOC OSTEOARTHROS NOT  SPEC PRIM/SEC LOWER LEG 11/10/2007  ? Memory loss 08/27/2007  ? MENISCUS TEAR 10/30/2006  ? OSTEOARTHRITIS 06/07/2008  ? Pneumonia   ? SLEEP APNEA 09/20/2009  ? TESTICULAR HYPOFUNCTION 08/14/2009  ?  ?Past Surgical History:  ?Procedure Laterality Date  ? COLONOSCOPY    ? HAND SURGERY  over 20 years ago  ? 'metal plate in wrist'  ? JOINT REPLACEMENT N/A   ? [employer] 10/19/2019  ? MULTIPLE TOOTH EXTRACTIONS    ? SHOULDER SURGERY Right 2021  ? 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  ? Procedure: TOTAL KNEE ARTHROPLASTY;  Surgeon: Frederik Pear, MD;  Location: [facility];  Service: Orthopedics;  Laterality: Left;  ?  ?Current Facility-Administered Medications  ?Medication Dose Route Frequency Provider Last Rate Last Admin  ? 0.9 %  sodium chloride infusion  500 mL Intravenous Continuous Gatha Mayer, MD      ? ?Current Outpatient Medications  ?Medication Sig Dispense Refill Last Dose  ? amLODipine (NORVASC) 5 MG tablet Take 1 tablet (5 mg total) by mouth daily. 90 tablet 1   ? Bioflavonoid Products (VITAMIN C PLUS) 1000 MG TABS Take 1 tablet by mouth daily. Vitamin C, Zinc, Elderberry     ? Glucosamine 500 MG CAPS Take 2 capsules by mouth daily.      ? hydrochlorothiazide (HYDRODIURIL) 25 MG tablet TAKE 1 TABLET(25 MG) BY MOUTH DAILY 90 tablet 3   ? metoprolol succinate (TOPROL-XL) 100 MG 24 hr tablet Take with or immediately following a meal. (Patient taking differently: Take 100 mg by mouth daily. Take with or immediately following a meal.) 90 tablet 1   ? Multiple Vitamin (MULTIVITAMIN) tablet Take  1 tablet by mouth daily.     ? Omega-3 Fatty Acids (FISH OIL) 1000 MG CPDR Take 1,000 mg by mouth daily.     ? sodium chloride (OCEAN) 0.65 % SOLN nasal spray Place 1 spray into both nostrils as needed for congestion.     ? Turmeric (QC TUMERIC COMPLEX PO) Take 1 capsule by mouth daily.     ? ALPRAZolam (XANAX) 0.5 MG tablet TAKE 1 TABLET(0.5 MG) BY MOUTH THREE TIMES DAILY AS NEEDED FOR ANXIETY 30 tablet 2   ? COVID-19 mRNA bivalent  vaccine, Pfizer, (PFIZER COVID-19 VAC BIVALENT) injection Inject into the muscle. 0.3 mL 0   ? ?Allergies  ?Allergen Reactions  ? Irbesartan   ?  Tingling, swelling ? ?Pt unaware of allergy reaction   ? Percocet [Oxycodone-Acetaminophen] Nausea And Vomiting  ? Shingrix [Zoster Vac Recomb Adjuvanted] Itching  ?  ?Social History  ? ?Tobacco Use  ? Smoking status: Former  ?  Types: Cigarettes  ?  Quit date: 02/25/1997  ?  Years since quitting: 24.2  ? Smokeless tobacco: Never  ? Tobacco comments:  ?  quit 1999  ?Substance Use Topics  ? Alcohol use: Yes  ?  Alcohol/week: 6.0 standard drinks  ?  Types: 6 Cans of beer per week  ?  Comment: occasional  ?  ?Family History  ?Problem Relation Age of Onset  ? Arthritis Mother   ? Heart disease Mother   ? Hypertension Mother   ? Hypertension Father   ? Stroke Father   ? Hypertension Sister   ? Stroke Brother   ? Colon cancer Paternal Uncle   ?  ? ?Review of Systems  ?Constitutional: Negative.   ?HENT: Negative.    ?Eyes: Negative.   ?Respiratory: Negative.    ?Cardiovascular:   ?     HTN  ?Gastrointestinal: Negative.   ?Endocrine: Negative.   ?Genitourinary: Negative.   ?Musculoskeletal:  Positive for arthralgias and myalgias.  ?Skin: Negative.   ?Allergic/Immunologic: Negative.   ?Neurological: Negative.   ?Hematological: Negative.   ?Psychiatric/Behavioral:  Positive for sleep disturbance.   ? ?Objective: ? ?Physical Exam ?Constitutional:   ?   Appearance: He is normal weight.  ?HENT:  ?   Head: Normocephalic and atraumatic.  ?   Nose: Nose normal.  ?   Mouth/Throat:  ?   Mouth: Mucous membranes are moist.  ?   Pharynx: Oropharynx is clear.  ?Eyes:  ?   Pupils: Pupils are equal, round, and reactive to light.  ?Cardiovascular:  ?   Pulses: Normal pulses.  ?Pulmonary:  ?   Effort: Pulmonary effort is normal.  ?Musculoskeletal:     ?   General: Tenderness present.  ?   Cervical back: Normal range of motion and neck supple.  ?   Comments: the patient's left knee has good strength  good range of motion.  He has a range from 0-125? on the left and near 0-120? on the right.  No instability.  Pain over the medial joint line.  Obvious crepitus with range of motion.  Calves are soft and nontender.  ?Skin: ?   General: Skin is warm and dry.  ?Neurological:  ?   General: No focal deficit present.  ?   Mental Status: He is alert and oriented to person, place, and time. Mental status is at baseline.  ?Psychiatric:     ?   Mood and Affect: Mood normal.     ?   Behavior: Behavior normal.     ?  Thought Content: Thought content normal.     ?   Judgment: Judgment normal.  ? ? ?Vital signs in last 24 hours: ?  ? ?Labs: ? ? ?Estimated body mass index is 27.09 kg/m? as calculated from the following: ?  Height as of 05/11/21: '6\' 2"'$  (1.88 m). ?  Weight as of 05/11/21: 95.7 kg. ? ? ?Imaging Review ?Plain radiographs demonstrate  bilateral AP weightbearing, bilateral Lutricia Feil, lateral sunrise views of the right knee are taken and reviewed in office today.  This shows end-stage bone-on-bone arthritis medial compartment with small periarticular osteophyte formation.  Patient's left total knee appears be well-placed and well fixed. ? ? ?Assessment/Plan: ? ?End stage arthritis, right knee  ? ?The patient history, physical examination, clinical judgment of the provider and imaging studies are consistent with end stage degenerative joint disease of the right knee(s) and total knee arthroplasty is deemed medically necessary. The treatment options including medical management, injection therapy arthroscopy and arthroplasty were discussed at length. The risks and benefits of total knee arthroplasty were presented and reviewed. The risks due to aseptic loosening, infection, stiffness, patella tracking problems, thromboembolic complications and other imponderables were discussed. The patient acknowledged the explanation, agreed to proceed with the plan and consent was signed. Patient is being admitted for inpatient  treatment for surgery, pain control, PT, OT, prophylactic antibiotics, VTE prophylaxis, progressive ambulation and ADL's and discharge planning. The patient is planning to be discharged home with home health ser

## 2021-05-19 NOTE — Anesthesia Preprocedure Evaluation (Addendum)
Anesthesia Evaluation  ?Patient identified by MRN, date of birth, ID band ?Patient awake ? ? ? ?Reviewed: ?Allergy & Precautions, NPO status , Unable to perform ROS - Chart review only ? ?History of Anesthesia Complications ?Negative for: history of anesthetic complications ? ?Airway ?Mallampati: II ? ?TM Distance: >3 FB ?Neck ROM: Full ? ? ? Dental ?no notable dental hx. ?(+) Dental Advisory Given ?  ?Pulmonary ?sleep apnea , former smoker,  ?  ?Pulmonary exam normal ? ? ? ? ? ? ? Cardiovascular ?hypertension, Pt. on medications and Pt. on home beta blockers ?Normal cardiovascular exam ? ? ?  ?Neuro/Psych ?  ? GI/Hepatic ?  ?Endo/Other  ? ? Renal/GU ?  ? ?  ?Musculoskeletal ? ?(+) Arthritis ,  ? Abdominal ?  ?Peds ? Hematology ?  ?Anesthesia Other Findings ? ? Reproductive/Obstetrics ? ?  ? ? ? ? ? ? ? ? ? ? ? ? ? ?  ?  ? ? ? ? ? ? ? ?Anesthesia Physical ?Anesthesia Plan ? ?ASA: 2 ? ?Anesthesia Plan: Spinal and MAC  ? ?Post-op Pain Management: Regional block*, Tylenol PO (pre-op)* and Celebrex PO (pre-op)*  ? ?Induction:  ? ?PONV Risk Score and Plan: 2 and Ondansetron and Propofol infusion ? ?Airway Management Planned: Natural Airway ? ?Additional Equipment:  ? ?Intra-op Plan:  ? ?Post-operative Plan:  ? ?Informed Consent: I have reviewed the patients History and Physical, chart, labs and discussed the procedure including the risks, benefits and alternatives for the proposed anesthesia with the patient or authorized representative who has indicated his/her understanding and acceptance.  ? ? ? ?Dental advisory given ? ?Plan Discussed with: Anesthesiologist and CRNA ? ?Anesthesia Plan Comments:   ? ? ? ? ? ?Anesthesia Quick Evaluation ? ?

## 2021-05-20 MED ORDER — SODIUM CHLORIDE 0.9 % IV SOLN
2000.0000 mg | INTRAVENOUS | Status: DC
  Filled 2021-05-20: qty 20

## 2021-05-21 ENCOUNTER — Ambulatory Visit (HOSPITAL_COMMUNITY): Payer: Medicare Other | Admitting: Anesthesiology

## 2021-05-21 ENCOUNTER — Encounter (HOSPITAL_COMMUNITY): Payer: Self-pay | Admitting: Orthopedic Surgery

## 2021-05-21 ENCOUNTER — Ambulatory Visit (HOSPITAL_COMMUNITY)
Admission: RE | Admit: 2021-05-21 | Discharge: 2021-05-21 | Disposition: A | Discharge status: Home / Self Care | Payer: Medicare Other | Source: Ambulatory Visit | Attending: Orthopedic Surgery | Admitting: Orthopedic Surgery

## 2021-05-21 ENCOUNTER — Ambulatory Visit (HOSPITAL_BASED_OUTPATIENT_CLINIC_OR_DEPARTMENT_OTHER): Payer: Medicare Other | Admitting: Anesthesiology

## 2021-05-21 ENCOUNTER — Encounter (HOSPITAL_COMMUNITY)
Admission: RE | Disposition: A | Discharge status: Home / Self Care | Payer: Self-pay | Source: Ambulatory Visit | Attending: Orthopedic Surgery

## 2021-05-21 ENCOUNTER — Other Ambulatory Visit: Payer: Self-pay

## 2021-05-21 DIAGNOSIS — M170 Bilateral primary osteoarthritis of knee: Secondary | ICD-10-CM | POA: Diagnosis not present

## 2021-05-21 DIAGNOSIS — I10 Essential (primary) hypertension: Secondary | ICD-10-CM | POA: Insufficient documentation

## 2021-05-21 DIAGNOSIS — Z87891 Personal history of nicotine dependence: Secondary | ICD-10-CM | POA: Insufficient documentation

## 2021-05-21 DIAGNOSIS — M1711 Unilateral primary osteoarthritis, right knee: Secondary | ICD-10-CM

## 2021-05-21 DIAGNOSIS — M25561 Pain in right knee: Secondary | ICD-10-CM | POA: Diagnosis present

## 2021-05-21 DIAGNOSIS — G4730 Sleep apnea, unspecified: Secondary | ICD-10-CM | POA: Diagnosis not present

## 2021-05-21 DIAGNOSIS — G8918 Other acute postprocedural pain: Secondary | ICD-10-CM | POA: Diagnosis not present

## 2021-05-21 DIAGNOSIS — Z96651 Presence of right artificial knee joint: Secondary | ICD-10-CM | POA: Diagnosis not present

## 2021-05-21 HISTORY — PX: TOTAL KNEE ARTHROPLASTY: SHX125

## 2021-05-21 LAB — TYPE AND SCREEN
ABO/RH(D): AB POS
Antibody Screen: NEGATIVE

## 2021-05-21 SURGERY — ARTHROPLASTY, KNEE, TOTAL
Anesthesia: Monitor Anesthesia Care | Site: Knee | Laterality: Right

## 2021-05-21 MED ORDER — TRANEXAMIC ACID-NACL 1000-0.7 MG/100ML-% IV SOLN: 1000.0000 mg | Freq: Once | INTRAVENOUS | Status: DC

## 2021-05-21 MED ORDER — TRANEXAMIC ACID-NACL 1000-0.7 MG/100ML-% IV SOLN: 1000.0000 mg | Freq: Once | INTRAVENOUS | Status: AC

## 2021-05-21 MED ORDER — SODIUM CHLORIDE 0.9 % IR SOLN
Status: DC | PRN
  Administered 2021-05-21: 1000 mL

## 2021-05-21 MED ORDER — PROPOFOL 1000 MG/100ML IV EMUL
INTRAVENOUS | Status: AC
  Filled 2021-05-21: qty 100

## 2021-05-21 MED ORDER — TRANEXAMIC ACID-NACL 1000-0.7 MG/100ML-% IV SOLN
INTRAVENOUS | Status: AC
  Administered 2021-05-21: 1000 mg via INTRAVENOUS
  Filled 2021-05-21: qty 100

## 2021-05-21 MED ORDER — METHOCARBAMOL 500 MG PO TABS: ORAL_TABLET | ORAL | 1 refills | Status: DC

## 2021-05-21 MED ORDER — LACTATED RINGERS IV SOLN: INTRAVENOUS | Status: DC

## 2021-05-21 MED ORDER — METHOCARBAMOL 500 MG PO TABS: 500.0000 mg | ORAL_TABLET | Freq: Four times a day (QID) | ORAL | Status: DC | PRN

## 2021-05-21 MED ORDER — BUPIVACAINE LIPOSOME 1.3 % IJ SUSP: 20.0000 mL | Freq: Once | INTRAMUSCULAR | Status: DC

## 2021-05-21 MED ORDER — BUPIVACAINE IN DEXTROSE 0.75-8.25 % IT SOLN
INTRATHECAL | Status: DC | PRN
  Administered 2021-05-21: 1.6 mL via INTRATHECAL

## 2021-05-21 MED ORDER — DEXAMETHASONE SODIUM PHOSPHATE 10 MG/ML IJ SOLN
INTRAMUSCULAR | Status: AC
  Filled 2021-05-21: qty 1

## 2021-05-21 MED ORDER — AMISULPRIDE (ANTIEMETIC) 5 MG/2ML IV SOLN: 10.0000 mg | Freq: Once | INTRAVENOUS | Status: DC | PRN

## 2021-05-21 MED ORDER — WATER FOR IRRIGATION, STERILE IR SOLN
Status: DC | PRN
Start: 2021-05-21 — End: 2021-05-21
  Administered 2021-05-21: 2000 mL

## 2021-05-21 MED ORDER — LACTATED RINGERS IV BOLUS
250.0000 mL | Freq: Once | INTRAVENOUS | Status: AC
  Administered 2021-05-21: 250 mL via INTRAVENOUS

## 2021-05-21 MED ORDER — TRANEXAMIC ACID-NACL 1000-0.7 MG/100ML-% IV SOLN
1000.0000 mg | INTRAVENOUS | Status: AC
  Administered 2021-05-21: 1000 mg via INTRAVENOUS
  Filled 2021-05-21: qty 100

## 2021-05-21 MED ORDER — MIDAZOLAM HCL 2 MG/2ML IJ SOLN
INTRAMUSCULAR | Status: AC
  Filled 2021-05-21: qty 2

## 2021-05-21 MED ORDER — LIDOCAINE HCL (PF) 2 % IJ SOLN
INTRAMUSCULAR | Status: AC
  Filled 2021-05-21: qty 5

## 2021-05-21 MED ORDER — BUPIVACAINE LIPOSOME 1.3 % IJ SUSP
INTRAMUSCULAR | Status: DC | PRN
  Administered 2021-05-21: 20 mL

## 2021-05-21 MED ORDER — ROPIVACAINE HCL 7.5 MG/ML IJ SOLN
INTRAMUSCULAR | Status: DC | PRN
  Administered 2021-05-21: 20 mL via PERINEURAL

## 2021-05-21 MED ORDER — BUPIVACAINE LIPOSOME 1.3 % IJ SUSP
INTRAMUSCULAR | Status: AC
  Filled 2021-05-21: qty 20

## 2021-05-21 MED ORDER — HYDROMORPHONE HCL 2 MG PO TABS
2.0000 mg | ORAL_TABLET | ORAL | 0 refills | Status: AC | PRN
Start: 2021-05-21 — End: 2021-05-28

## 2021-05-21 MED ORDER — CEFAZOLIN SODIUM-DEXTROSE 2-4 GM/100ML-% IV SOLN
2.0000 g | INTRAVENOUS | Status: AC
  Administered 2021-05-21: 2 g via INTRAVENOUS
  Filled 2021-05-21: qty 100

## 2021-05-21 MED ORDER — BUPIVACAINE-EPINEPHRINE (PF) 0.25% -1:200000 IJ SOLN
INTRAMUSCULAR | Status: DC | PRN
  Administered 2021-05-21: 30 mL via PERINEURAL

## 2021-05-21 MED ORDER — ASPIRIN EC 81 MG PO TBEC: 81.0000 mg | DELAYED_RELEASE_TABLET | Freq: Every day | ORAL | 2 refills | Status: DC

## 2021-05-21 MED ORDER — ONDANSETRON HCL 4 MG/2ML IJ SOLN
INTRAMUSCULAR | Status: AC
  Filled 2021-05-21: qty 2

## 2021-05-21 MED ORDER — FENTANYL CITRATE PF 50 MCG/ML IJ SOSY: 25.0000 ug | PREFILLED_SYRINGE | INTRAMUSCULAR | Status: DC | PRN

## 2021-05-21 MED ORDER — FENTANYL CITRATE (PF) 100 MCG/2ML IJ SOLN
INTRAMUSCULAR | Status: DC | PRN
  Administered 2021-05-21: 50 ug via INTRAVENOUS

## 2021-05-21 MED ORDER — BUPIVACAINE-EPINEPHRINE (PF) 0.25% -1:200000 IJ SOLN
INTRAMUSCULAR | Status: AC
  Filled 2021-05-21: qty 30

## 2021-05-21 MED ORDER — CHLORHEXIDINE GLUCONATE 0.12 % MT SOLN
15.0000 mL | Freq: Once | OROMUCOSAL | Status: AC
  Administered 2021-05-21: 15 mL via OROMUCOSAL

## 2021-05-21 MED ORDER — PROCHLORPERAZINE EDISYLATE 10 MG/2ML IJ SOLN: 10.0000 mg | Freq: Once | INTRAMUSCULAR | Status: DC

## 2021-05-21 MED ORDER — PROPOFOL 500 MG/50ML IV EMUL
INTRAVENOUS | Status: DC | PRN
  Administered 2021-05-21: 50 ug/kg/min via INTRAVENOUS

## 2021-05-21 MED ORDER — METHOCARBAMOL 500 MG IVPB - SIMPLE MED: 500.0000 mg | Freq: Four times a day (QID) | INTRAVENOUS | Status: DC | PRN

## 2021-05-21 MED ORDER — PHENYLEPHRINE HCL-NACL 20-0.9 MG/250ML-% IV SOLN
INTRAVENOUS | Status: DC | PRN
  Administered 2021-05-21: 30 ug/min via INTRAVENOUS

## 2021-05-21 MED ORDER — PHENYLEPHRINE HCL-NACL 20-0.9 MG/250ML-% IV SOLN
INTRAVENOUS | Status: AC
  Filled 2021-05-21: qty 250

## 2021-05-21 MED ORDER — CELECOXIB 200 MG PO CAPS
200.0000 mg | ORAL_CAPSULE | Freq: Once | ORAL | Status: AC
Start: 2021-05-21 — End: 2021-05-21
  Administered 2021-05-21: 200 mg via ORAL
  Filled 2021-05-21: qty 1

## 2021-05-21 MED ORDER — SODIUM CHLORIDE (PF) 0.9 % IJ SOLN
INTRAMUSCULAR | Status: AC
  Filled 2021-05-21: qty 50

## 2021-05-21 MED ORDER — DEXAMETHASONE SODIUM PHOSPHATE 10 MG/ML IJ SOLN
INTRAMUSCULAR | Status: DC | PRN
  Administered 2021-05-21: 5 mg
  Administered 2021-05-21: 8 mg

## 2021-05-21 MED ORDER — LIDOCAINE HCL (CARDIAC) PF 100 MG/5ML IV SOSY
PREFILLED_SYRINGE | INTRAVENOUS | Status: DC | PRN
  Administered 2021-05-21: 50 mg via INTRAVENOUS

## 2021-05-21 MED ORDER — PROPOFOL 10 MG/ML IV BOLUS
INTRAVENOUS | Status: DC | PRN
  Administered 2021-05-21 (×2): 10 mg via INTRAVENOUS

## 2021-05-21 MED ORDER — LACTATED RINGERS IV BOLUS
500.0000 mL | Freq: Once | INTRAVENOUS | Status: AC
  Administered 2021-05-21: 500 mL via INTRAVENOUS

## 2021-05-21 MED ORDER — TRANEXAMIC ACID 1000 MG/10ML IV SOLN
INTRAVENOUS | Status: DC | PRN
  Administered 2021-05-21: 2000 mg via TOPICAL

## 2021-05-21 MED ORDER — FENTANYL CITRATE (PF) 100 MCG/2ML IJ SOLN
INTRAMUSCULAR | Status: AC
  Filled 2021-05-21: qty 2

## 2021-05-21 MED ORDER — ACETAMINOPHEN 500 MG PO TABS
1000.0000 mg | ORAL_TABLET | Freq: Once | ORAL | Status: AC
  Administered 2021-05-21: 1000 mg via ORAL
  Filled 2021-05-21: qty 2

## 2021-05-21 MED ORDER — POVIDONE-IODINE 10 % EX SWAB
2.0000 "application " | Freq: Once | CUTANEOUS | Status: AC
  Administered 2021-05-21: 2 via TOPICAL

## 2021-05-21 MED ORDER — SODIUM CHLORIDE (PF) 0.9 % IJ SOLN
INTRAMUSCULAR | Status: DC | PRN
Start: 2021-05-21 — End: 2021-05-21
  Administered 2021-05-21: 50 mL via INTRAVENOUS

## 2021-05-21 MED ORDER — ONDANSETRON HCL 4 MG/2ML IJ SOLN
INTRAMUSCULAR | Status: DC | PRN
  Administered 2021-05-21: 4 mg via INTRAVENOUS

## 2021-05-21 MED ORDER — ORAL CARE MOUTH RINSE: 15.0000 mL | Freq: Once | OROMUCOSAL | Status: AC

## 2021-05-21 MED ORDER — MIDAZOLAM HCL 5 MG/5ML IJ SOLN
INTRAMUSCULAR | Status: DC | PRN
  Administered 2021-05-21: 1.5 mg via INTRAVENOUS

## 2021-05-21 SURGICAL SUPPLY — 53 items
ATTUNE MED DOME PAT 41 KNEE (Knees) ×1 IMPLANT
ATTUNE PS FEM RT SZ 7 CEM KNEE (Femur) ×1 IMPLANT
ATTUNE PSRP INSR SZ7 5 KNEE (Insert) ×1 IMPLANT
BAG COUNTER SPONGE SURGICOUNT (BAG) IMPLANT
BAG DECANTER FOR FLEXI CONT (MISCELLANEOUS) ×3 IMPLANT
BAG SPEC THK2 15X12 ZIP CLS (MISCELLANEOUS) ×1
BAG SPNG CNTER NS LX DISP (BAG)
BAG ZIPLOCK 12X15 (MISCELLANEOUS) ×3 IMPLANT
BASE TIBIAL ROT PLAT SZ 8 KNEE (Knees) IMPLANT
BLADE SAG 18X100X1.27 (BLADE) ×3 IMPLANT
BLADE SAW SGTL 11.0X1.19X90.0M (BLADE) ×3 IMPLANT
BLADE SURG SZ10 CARB STEEL (BLADE) ×6 IMPLANT
BNDG CMPR MED 10X6 ELC LF (GAUZE/BANDAGES/DRESSINGS) ×1
BNDG ELASTIC 6X10 VLCR STRL LF (GAUZE/BANDAGES/DRESSINGS) ×3 IMPLANT
BNDG ELASTIC 6X5.8 VLCR STR LF (GAUZE/BANDAGES/DRESSINGS) ×1 IMPLANT
BOWL SMART MIX CTS (DISPOSABLE) ×3 IMPLANT
BSPLAT TIB 8 CMNT ROT PLAT STR (Knees) ×1 IMPLANT
CEMENT HV SMART SET (Cement) ×6 IMPLANT
COVER SURGICAL LIGHT HANDLE (MISCELLANEOUS) ×3 IMPLANT
CUFF TOURN SGL QUICK 34 (TOURNIQUET CUFF) ×2
CUFF TRNQT CYL 34X4.125X (TOURNIQUET CUFF) ×2 IMPLANT
DRAPE INCISE IOBAN 66X45 STRL (DRAPES) ×3 IMPLANT
DRAPE U-SHAPE 47X51 STRL (DRAPES) ×3 IMPLANT
DRSG AQUACEL AG ADV 3.5X10 (GAUZE/BANDAGES/DRESSINGS) ×3 IMPLANT
DURAPREP 26ML APPLICATOR (WOUND CARE) ×3 IMPLANT
ELECT REM PT RETURN 15FT ADLT (MISCELLANEOUS) ×3 IMPLANT
GLOVE SRG 8 PF TXTR STRL LF DI (GLOVE) ×2 IMPLANT
GLOVE SURG ENC MOIS LTX SZ7.5 (GLOVE) ×3 IMPLANT
GLOVE SURG ENC MOIS LTX SZ8.5 (GLOVE) ×3 IMPLANT
GLOVE SURG UNDER POLY LF SZ8 (GLOVE) ×2
GLOVE SURG UNDER POLY LF SZ9 (GLOVE) ×3 IMPLANT
GOWN STRL REUS W/ TWL XL LVL3 (GOWN DISPOSABLE) ×4 IMPLANT
GOWN STRL REUS W/TWL XL LVL3 (GOWN DISPOSABLE) ×4
HANDPIECE INTERPULSE COAX TIP (DISPOSABLE) ×2
HOOD PEEL AWAY FLYTE STAYCOOL (MISCELLANEOUS) ×9 IMPLANT
KIT TURNOVER KIT A (KITS) IMPLANT
NDL HYPO 21X1.5 SAFETY (NEEDLE) ×4 IMPLANT
NEEDLE HYPO 21X1.5 SAFETY (NEEDLE) ×4 IMPLANT
NS IRRIG 1000ML POUR BTL (IV SOLUTION) ×3 IMPLANT
PACK TOTAL KNEE CUSTOM (KITS) ×3 IMPLANT
PROTECTOR NERVE ULNAR (MISCELLANEOUS) ×3 IMPLANT
SET HNDPC FAN SPRY TIP SCT (DISPOSABLE) ×2 IMPLANT
SPIKE FLUID TRANSFER (MISCELLANEOUS) ×9 IMPLANT
SUT VIC AB 1 CTX 36 (SUTURE) ×2
SUT VIC AB 1 CTX36XBRD ANBCTR (SUTURE) ×2 IMPLANT
SUT VIC AB 3-0 CT1 27 (SUTURE) ×6
SUT VIC AB 3-0 CT1 TAPERPNT 27 (SUTURE) ×6 IMPLANT
SYR CONTROL 10ML LL (SYRINGE) ×6 IMPLANT
TIBIAL BASE ROT PLAT SZ 8 KNEE (Knees) ×2 IMPLANT
TRAY CATH INTERMITTENT SS 16FR (CATHETERS) IMPLANT
TRAY FOLEY MTR SLVR 16FR STAT (SET/KITS/TRAYS/PACK) ×3 IMPLANT
WATER STERILE IRR 1000ML POUR (IV SOLUTION) ×6 IMPLANT
WRAP KNEE MAXI GEL POST OP (GAUZE/BANDAGES/DRESSINGS) ×3 IMPLANT

## 2021-05-21 NOTE — Anesthesia Postprocedure Evaluation (Signed)
Anesthesia Post Note ? ?Patient: Nathan Stewart ? ?Procedure(s) Performed: RIGHT TOTAL KNEE ARTHROPLASTY (Right: Knee) ? ?  ? ?Patient location during evaluation: PACU ?Anesthesia Type: MAC and Spinal ?Level of consciousness: awake and alert ?Pain management: pain level controlled ?Vital Signs Assessment: post-procedure vital signs reviewed and stable ?Respiratory status: spontaneous breathing and respiratory function stable ?Cardiovascular status: blood pressure returned to baseline and stable ?Postop Assessment: spinal receding ?Anesthetic complications: no ? ? ?No notable events documented. ? ?Last Vitals:  ?Vitals:  ? 05/21/21 1130 05/21/21 1200  ?BP:  138/82  ?Pulse: 67 67  ?Resp:  16  ?Temp:    ?SpO2: 97% 97%  ?  ?Last Pain:  ?Vitals:  ? 05/21/21 1200  ?TempSrc:   ?PainSc: 0-No pain  ? ? ?  ?  ?  ?  ?  ?  ? ?Kimmy Totten DANIEL ? ? ? ? ?

## 2021-05-21 NOTE — Anesthesia Procedure Notes (Signed)
Spinal ? ?Patient location during procedure: OR ?Start time: 05/21/2021 7:21 AM ?End time: 05/21/2021 7:25 AM ?Reason for block: surgical anesthesia ?Staffing ?Performed: resident/CRNA  ?Resident/CRNA: Garrel Ridgel, CRNA ?Preanesthetic Checklist ?Completed: patient identified, IV checked, site marked, risks and benefits discussed, surgical consent, monitors and equipment checked, pre-op evaluation and timeout performed ?Spinal Block ?Patient position: sitting ?Prep: ChloraPrep ?Patient monitoring: heart rate, cardiac monitor, continuous pulse ox and blood pressure ?Approach: midline ?Location: L4-5 ?Injection technique: single-shot ?Needle ?Needle type: Pencan  ?Needle gauge: 24 G ?Needle length: 9 cm ?Needle insertion depth: 7 cm ?Assessment ?Sensory level: T4 ?Events: CSF return ? ? ? ?

## 2021-05-21 NOTE — Transfer of Care (Signed)
Immediate Anesthesia Transfer of Care Note ? ?Patient: Nathan Stewart ? ?Procedure(s) Performed: RIGHT TOTAL KNEE ARTHROPLASTY (Right: Knee) ? ?Patient Location: PACU ? ?Anesthesia Type:Spinal ? ?Level of Consciousness: awake, alert , oriented and patient cooperative ? ?Airway & Oxygen Therapy: Patient Spontanous Breathing and Patient connected to face mask oxygen ? ?Post-op Assessment: Report given to RN and Post -op Vital signs reviewed and stable ? ?Post vital signs: Reviewed and stable ? ?Last Vitals:  ?Vitals Value Taken Time  ?BP 106/71 05/21/21 0926  ?Temp    ?Pulse 73 05/21/21 0928  ?Resp    ?SpO2 99 % 05/21/21 0928  ?Vitals shown include unvalidated device data. ? ?Last Pain:  ?Vitals:  ? 05/21/21 0550  ?TempSrc:   ?PainSc: 0-No pain  ?   ? ?  ? ?Complications: No notable events documented. ?

## 2021-05-21 NOTE — Discharge Instructions (Signed)

## 2021-05-21 NOTE — Anesthesia Procedure Notes (Signed)
Anesthesia Regional Block: Adductor canal block  ? ?Pre-Anesthetic Checklist: , timeout performed,  Correct Patient, Correct Site, Correct Laterality,  Correct Procedure, Correct Position, site marked,  Risks and benefits discussed,  Surgical consent,  Pre-op evaluation,  At surgeon's request and post-op pain management ? ?Laterality: Right ? ?Prep: chloraprep     ?  ?Needles:  ?Injection technique: Single-shot ? ?Needle Type: Stimulator Needle - 80   ? ? ?Needle Length: 10cm  ?Needle Gauge: 21  ? ? ? ?Additional Needles: ? ? ?Narrative:  ?Start time: 05/21/2021 6:39 AM ?End time: 05/21/2021 6:49 AM ?Injection made incrementally with aspirations every 5 mL. ? ?Performed by: Personally  ?Anesthesiologist: Duane Boston, MD ? ? ? ? ?

## 2021-05-21 NOTE — Evaluation (Addendum)
Physical Therapy Evaluation ?Patient Details ?Name: Nathan Stewart ?MRN: [id number] ?DOB: 21-Dec-1955 ?Today's Date: 05/21/2021 ? ?History of Present Illness ? Pt is a 66yo male presenting s/p R-TKA on 05/21/21. PMH: OA, GERD, HTN, L-TKA 2016.  ?Clinical Impression ? Nathan Stewart is a 66 y.o. male POD 0 s/p R-TKA. Patient reports independence with mobility at baseline. Patient is now limited by functional impairments (see PT problem list below) and requires min guard for transfers and gait with RW. Patient was able to ambulate 80 feet with RW and cues for safe walker management. Patient educated on safe sequencing for stair mobility and verbalized safe guarding position for people assisting with mobility. Patient instructed in exercises to facilitate ROM and circulation. Patient will benefit from continued skilled PT interventions to address impairments and progress towards PLOF. Patient has met mobility goals at adequate level for discharge home; will continue to follow if pt continues acute stay to progress towards Mod I goals. ?   ?   ? ?Recommendations for follow up therapy are one component of a multi-disciplinary discharge planning process, led by the attending physician.  Recommendations may be updated based on patient status, additional functional criteria and insurance authorization. ? ?Follow Up Recommendations Follow physician's recommendations for discharge plan and follow up therapies ? ?  ?Assistance Recommended at Discharge Set up Supervision/Assistance  ?Patient can return home with the following ? A little help with walking and/or transfers;A little help with bathing/dressing/bathroom;Assistance with cooking/housework;Assist for transportation;Help with stairs or ramp for entrance ? ?  ?Equipment Recommendations None recommended by PT (Pt has recommended PT)  ?Recommendations for Other Services ?    ?  ?Functional Status Assessment Patient has had a recent decline in their functional status and  demonstrates the ability to make significant improvements in function in a reasonable and predictable amount of time.  ? ?  ?Precautions / Restrictions Precautions ?Precautions: Knee ?Precaution Booklet Issued: No ?Restrictions ?Weight Bearing Restrictions: No ?Other Position/Activity Restrictions: WBAT  ? ?  ? ?Mobility ? Bed Mobility ?Overal bed mobility: Needs Assistance ?Bed Mobility: Supine to Sit ?  ?  ?Supine to sit: Min guard ?  ?  ?General bed mobility comments: pt min guard for safety only, no physical assist required. ?  ? ?Transfers ?Overall transfer level: Needs assistance ?Equipment used: Rolling walker (2 wheels) ?Transfers: Sit to/from Stand ?Sit to Stand: Min guard ?  ?  ?  ?  ?  ?General transfer comment: pt min guard for safety only, no physical assist required. VC for hand placement on bed to power up ?  ? ?Ambulation/Gait ?Ambulation/Gait assistance: Min guard ?Gait Distance (Feet): 80 Feet ?Assistive device: Rolling walker (2 wheels) ?Gait Pattern/deviations: Step-through pattern, Decreased stance time - right, Decreased step length - left ?Gait velocity: decreased ?  ?  ?General Gait Details: Pt ambulated with min guard assist for safet only, no physical assist required. Demonstrated step-to pattern progressing to step-through pattern. VC for proximity to device. No overt LOB noted. ? ?Stairs ?Stairs: Yes ?Stairs assistance: Min guard ?Stair Management: Two rails, Step to pattern, Forwards ?Number of Stairs: 2 ?General stair comments: Pt educated on stair ascent/descent, verbalized understanding. Demonstrated safe form and technique, no overt LOB noted. ? ?Wheelchair Mobility ?  ? ?Modified Rankin (Stroke Patients Only) ?  ? ?  ? ?Balance Overall balance assessment: Needs assistance ?Sitting-balance support: Feet supported, No upper extremity supported ?Sitting balance-Leahy Scale: Fair ?  ?  ?Standing balance support: Bilateral upper extremity supported, [place]  on assistive device for  balance, During functional activity ?Standing balance-Leahy Scale: Poor ?  ?  ?  ?  ?  ?  ?  ?  ?  ?  ?  ?  ?   ? ? ? ?Pertinent Vitals/Pain Pain Assessment ?Pain Assessment: 0-10 ?Pain Score: 4  ?Pain Location: R knee ?Pain Descriptors / Indicators: Operative site guarding, Discomfort ?Pain Intervention(s): Monitored during session, Repositioned, Ice applied  ? ? ?Home Living Family/patient expects to be discharged to:: Private residence ?Living Arrangements: Spouse/significant other (Wife Gwyndolyn Saxon) ?Available Help at Discharge: Available 24 hours/day;Family ?Type of Home: House ?Home Access: Stairs to enter ?Entrance Stairs-Rails: Can reach both;Left;Right ?Entrance Stairs-Number of Steps: 2 ?  ?Home Layout: One level ?Home Equipment: BSC/3in1;Rolling Walker (2 wheels) ?   ?  ?Prior Function Prior Level of Function : Independent/Modified Independent ?  ?  ?  ?  ?  ?  ?Mobility Comments: IND ?ADLs Comments: IND ?  ? ? ?Hand Dominance  ?   ? ?  ?Extremity/Trunk Assessment  ? Upper Extremity Assessment ?Upper Extremity Assessment: Overall WFL for tasks assessed ?  ? ?Lower Extremity Assessment ?Lower Extremity Assessment: RLE deficits/detail;LLE deficits/detail ?RLE Deficits / Details: MMT: ank df/pf 5/5, no extensor lag noted ?RLE Sensation: WNL ?LLE Deficits / Details: MMT: ank df/pf 5/5 ?LLE Sensation: WNL ?  ? ?Cervical / Trunk Assessment ?Cervical / Trunk Assessment: Normal  ?Communication  ? Communication: No difficulties  ?Cognition Arousal/Alertness: Awake/alert ?Behavior During Therapy: [facility] for tasks assessed/performed ?Overall Cognitive Status: Within Functional Limits for tasks assessed ?  ?  ?  ?  ?  ?  ?  ?  ?  ?  ?  ?  ?  ?  ?  ?  ?  ?  ?  ? ?  ?General Comments   ? ?  ?Exercises Total Joint Exercises ?Ankle Circles/Pumps: AROM, 20 reps ?Quad Sets: AROM, Right, 10 reps ?Short Arc Quad: AROM, Left, 10 reps ?Heel Slides: AROM, Left, 5 reps ?Hip ABduction/ADduction: AROM, Left, 5 reps ?Straight Leg Raises:  AROM, Left, 5 reps  ? ?Assessment/Plan  ?  ?PT Assessment Patient needs continued PT services  ?PT Problem List Decreased strength;Decreased range of motion;Decreased activity tolerance;Decreased balance;Decreased mobility;Decreased coordination;Decreased knowledge of use of DME;Pain ? ?   ?  ?PT Treatment Interventions DME instruction;Gait training;Stair training;Functional mobility training;Therapeutic activities;Therapeutic exercise;Balance training;Neuromuscular re-education;Patient/family education   ? ?PT Goals (Current goals can be found in the Care Plan section)  ?Acute Rehab PT Goals ?Patient Stated Goal: To get back to playing golf ?PT Goal Formulation: With patient ?Time For Goal Achievement: 06/04/21 ?Potential to Achieve Goals: Good ? ?  ?Frequency 7X/week ?  ? ? ?Co-evaluation   ?  ?  ?  ?  ? ? ?  ?AM-PAC PT "6 Clicks" Mobility  ?Outcome Measure Help needed turning from your back to your side while in a flat bed without using bedrails?: A Little ?Help needed moving from lying on your back to sitting on the side of a flat bed without using bedrails?: A Little ?Help needed moving to and from a bed to a chair (including a wheelchair)?: A Little ?Help needed standing up from a chair using your arms (e.g., wheelchair or bedside chair)?: A Little ?Help needed to walk in hospital room?: A Little ?Help needed climbing 3-5 steps with a railing? : A Little ?6 Click Score: 18 ? ?  ?End of Session Equipment Utilized During Treatment: Gait belt ?Activity Tolerance: Patient tolerated  treatment well ?Patient left: in chair;with call bell/phone within reach ?Nurse Communication: Mobility status ?PT Visit Diagnosis: Difficulty in walking, not elsewhere classified (R26.2) ?  ? ?Time: 7412-8786 ?PT Time Calculation (min) (ACUTE ONLY): 36 min ? ? ?Charges:   PT Evaluation ?$PT Eval Low Complexity: 1 Low ?PT Treatments ?$Gait Training: 23-37 mins ?  ?   ? ?Coolidge Breeze, PT, DPT ?[facility] Department ?Office:  [phone] ?Pager: [phone] ? ? ?Coolidge Breeze ?05/21/2021, 3:24 PM ?

## 2021-05-21 NOTE — Interval H&P Note (Signed)
History and Physical Interval Note: ? ?05/21/2021 ?6:25 AM ? ?Ardyth Harps  has presented today for surgery, with the diagnosis of RIGHT KNEE OSTEOARTHRITIS.  The various methods of treatment have been discussed with the patient and family. After consideration of risks, benefits and other options for treatment, the patient has consented to  Procedure(s): ?RIGHT TOTAL KNEE ARTHROPLASTY (Right) as a surgical intervention.  The patient's history has been reviewed, patient examined, no change in status, stable for surgery.  I have reviewed the patient's chart and labs.  Questions were answered to the patient's satisfaction.   ? ? ?Kerin Salen ? ? ?

## 2021-05-21 NOTE — Op Note (Signed)
PATIENT ID:      Nathan Stewart  ?MRN:     [id number] ?DOB/AGE:    66/26/1957 / 66 y.o. ? ? ?    OPERATIVE REPORT  ? ?DATE OF PROCEDURE:  05/21/2021   ?   ?PREOPERATIVE DIAGNOSIS:   RIGHT KNEE OSTEOARTHRITIS  ?    Estimated body mass index is 27.09 kg/m? as calculated from the following: ?  Height as of this encounter: '6\' 2"'$  (1.88 m). ?  Weight as of this encounter: 95.7 kg. ?                                                      ?POSTOPERATIVE DIAGNOSIS:   Same                                                                ?  ?PROCEDURE:  Procedure(s): ?RIGHT TOTAL KNEE ARTHROPLASTY Using DepuyAttune RP implants #7R Femur, #8Tibia, 5 mm Attune RP bearing, 41 Patella ?   ?SURGEON: Kerin Salen  ?ASSISTANT:   Kerry Hough. Barton Dubois   (Present and scrubbed throughout the case, critical for assistance with exposure, retraction, instrumentation, and closure.)      ?  ?ANESTHESIA: Spinal, 20cc Exparel, 50cc 0.25% Marcaine ?EBL: 300 cc ?FLUID REPLACEMENT: 1600 cc crystaloid ?TOURNIQUET: ?DRAINS: None ?TRANEXAMIC ACID: 1gm IV, 2gm topical ?COMPLICATIONS:  None      ?   ?INDICATIONS FOR PROCEDURE: The patient has ? RIGHT KNEE OSTEOARTHRITIS, Var deformities, XR shows bone on bone arthritis, lateral subluxation of tibia. Patient has failed all conservative measures including anti-inflammatory medicines, narcotics, attempts at exercise and weight loss, cortisone injections and viscosupplementation.  Risks and benefits of surgery have been discussed, questions answered.  ? ?DESCRIPTION OF PROCEDURE: The patient identified by armband, received  IV antibiotics, in the holding area at [facility]. Patient taken to the operating room, appropriate anesthetic monitors were attached, and spinal anesthesia was  induced. IV Tranexamic acid was given.Tourniquet applied high to the operative thigh. Lateral post and foot positioner applied to the table, the lower extremity was then prepped and draped in usual sterile fashion from the  toes to the tourniquet. Time-out procedure was performed. Kerry Hough. [facility] PAC, was present and scrubbed throughout the case, critical for assistance with, positioning, exposure, retraction, instrumentation, and closure.The skin and subcutaneous tissue along the incision was injected with 20 cc of a mixture of Exparel and Marcaine solution, using a 20-gauge by 1-1/2 inch needle. We began the operation, with the knee flexed 130 degrees, by making the anterior midline incision starting at handbreadth above the patella going over the patella 1 cm medial to and 4 cm distal to the tibial tubercle. Small bleeders in the skin and the subcutaneous tissue identified and cauterized. Transverse retinaculum was incised and reflected medially and a medial parapatellar arthrotomy was accomplished. the patella was everted and theprepatellar fat pad resected. The superficial medial collateral ligament was then elevated from anterior to posterior along the proximal flare of the tibia and anterior half of the menisci resected. The knee was hyperflexed exposing bone on bone arthritis. Peripheral  and notch osteophytes as well as the cruciate ligaments were then resected. We continued to work our way around posteriorly along the proximal tibia, and externally rotated the tibia subluxing it out from underneath the femur. A McHale PCL retractor was placed through the notch and a lateral Hohmann retractor placed, and we then entered the proximal tibia in line with the Depuy starter drill in line with the axis of the tibia followed by an intramedullary guide rod and 0-degree posterior slope cutting guide. The tibial cutting guide, 4 degree posterior sloped, was pinned into place allowing resection of 4 mm of bone medially and 12 mm of bone laterally. Satisfied with the tibial resection, we then entered the distal femur 2 mm anterior to the PCL origin with the intramedullary guide rod and applied the distal femoral cutting guide set at 9 mm,  with 5 degrees of valgus. This was pinned along the epicondylar axis. At this point, the distal femoral cut was accomplished without difficulty. We then sized for a #7R femoral component and pinned the guide in 3 degrees of external rotation. The chamfer cutting guide was pinned into place. The anterior, posterior, and chamfer cuts were accomplished without difficulty followed by the Attune RP box cutting guide and the box cut. We also removed posterior osteophytes from the posterior femoral condyles. The posterior capsule was injected with Exparel solution. The knee was brought into full extension. We checked our extension gap and fit a 5 mm bearing. Distracting in extension with a lamina spreader,  bleeders in the posterior capsule, Posterior medial and posterior lateral gutter were cauterized.  The transexamic acid-soaked sponge was then placed in the gap of the knee in extension. The knee was flexed 30?Marland Kitchen The posterior patella cut was accomplished with the 9.5 mm Attune cutting guide, sized for a 58m dome, and the fixation pegs drilled.The knee was then once again hyperflexed exposing the proximal tibia. We sized for a # 8 tibial base plate, applied the smokestack and the conical reamer followed by the the Delta fin keel punch. We then hammered into place the Attune RP trial femoral component, drilled the lugs, inserted a  5 mm trial bearing, trial patellar button, and took the knee through range of motion from 0-130 degrees. Medial and lateral ligamentous stability was checked. No thumb pressure was required for patellar Tracking. The tourniquet was not used. All trial components were removed, mating surfaces irrigated with pulse lavage, and dried with suction and sponges. 10 cc of the Exparel solution was applied to the cancellus bone of the patella distal femur and proximal tibia.  After waiting 30 seconds, the bony surfaces were again, dried with sponges. A double batch of DePuy HV cement was mixed and  applied to all bony metallic mating surfaces except for the posterior condyles of the femur itself. In order, we hammered into place the tibial tray and removed excess cement, the femoral component and removed excess cement. The final Attune RP bearing was inserted, and the knee brought to full extension with compression. The patellar button was clamped into place, and excess cement removed. The knee was held at 30? flexion with compression, while the cement cured. The wound was irrigated out with normal saline solution pulse lavage. The rest of the Exparel was injected into the parapatellar arthrotomy, subcutaneous tissues, and periosteal tissues. The parapatellar arthrotomy was closed with running #1 Vicryl suture. The subcutaneous tissue with 3-0 undyed Vicryl suture, and the skin with running 3-0 SQ vicryl. An Aquacil and  Ace wrap were applied. The patient was taken to recovery room without difficulty.  ? ?Kerin Salen ?05/21/2021, 6:29 AM ?  ?  ?

## 2021-05-21 NOTE — Progress Notes (Signed)
Orthopedic Tech Progress Note ?Patient Details:  ?Nathan Stewart ?[birth year] ?[id number] ? ?Ortho Devices ?Type of Ortho Device: Bone foam zero knee ?Ortho Device/Splint Interventions: Application ?  ?Post Interventions ?Patient Tolerated: Well ?Instructions Provided: Care of device ? ?[name] ?05/21/2021, 9:32 AM ? ?

## 2021-05-22 ENCOUNTER — Encounter (HOSPITAL_COMMUNITY): Payer: Self-pay | Admitting: Orthopedic Surgery

## 2021-05-23 DIAGNOSIS — Z96651 Presence of right artificial knee joint: Secondary | ICD-10-CM | POA: Diagnosis not present

## 2021-05-23 DIAGNOSIS — R2689 Other abnormalities of gait and mobility: Secondary | ICD-10-CM | POA: Diagnosis not present

## 2021-05-23 DIAGNOSIS — Z9889 Other specified postprocedural states: Secondary | ICD-10-CM | POA: Diagnosis not present

## 2021-05-23 DIAGNOSIS — R531 Weakness: Secondary | ICD-10-CM | POA: Diagnosis not present

## 2021-05-25 DIAGNOSIS — Z9889 Other specified postprocedural states: Secondary | ICD-10-CM | POA: Diagnosis not present

## 2021-05-25 DIAGNOSIS — R531 Weakness: Secondary | ICD-10-CM | POA: Diagnosis not present

## 2021-05-25 DIAGNOSIS — Z96651 Presence of right artificial knee joint: Secondary | ICD-10-CM | POA: Diagnosis not present

## 2021-05-25 DIAGNOSIS — R2689 Other abnormalities of gait and mobility: Secondary | ICD-10-CM | POA: Diagnosis not present

## 2021-05-29 DIAGNOSIS — Z96651 Presence of right artificial knee joint: Secondary | ICD-10-CM | POA: Diagnosis not present

## 2021-05-29 DIAGNOSIS — R531 Weakness: Secondary | ICD-10-CM | POA: Diagnosis not present

## 2021-05-29 DIAGNOSIS — R2689 Other abnormalities of gait and mobility: Secondary | ICD-10-CM | POA: Diagnosis not present

## 2021-05-29 DIAGNOSIS — Z9889 Other specified postprocedural states: Secondary | ICD-10-CM | POA: Diagnosis not present

## 2021-05-31 DIAGNOSIS — R2689 Other abnormalities of gait and mobility: Secondary | ICD-10-CM | POA: Diagnosis not present

## 2021-05-31 DIAGNOSIS — R531 Weakness: Secondary | ICD-10-CM | POA: Diagnosis not present

## 2021-05-31 DIAGNOSIS — Z96651 Presence of right artificial knee joint: Secondary | ICD-10-CM | POA: Diagnosis not present

## 2021-05-31 DIAGNOSIS — M1711 Unilateral primary osteoarthritis, right knee: Secondary | ICD-10-CM | POA: Diagnosis not present

## 2021-05-31 DIAGNOSIS — Z9889 Other specified postprocedural states: Secondary | ICD-10-CM | POA: Diagnosis not present

## 2021-06-04 DIAGNOSIS — Z96651 Presence of right artificial knee joint: Secondary | ICD-10-CM | POA: Diagnosis not present

## 2021-06-04 DIAGNOSIS — Z9889 Other specified postprocedural states: Secondary | ICD-10-CM | POA: Diagnosis not present

## 2021-06-04 DIAGNOSIS — R2689 Other abnormalities of gait and mobility: Secondary | ICD-10-CM | POA: Diagnosis not present

## 2021-06-04 DIAGNOSIS — R531 Weakness: Secondary | ICD-10-CM | POA: Diagnosis not present

## 2021-06-07 DIAGNOSIS — Z9889 Other specified postprocedural states: Secondary | ICD-10-CM | POA: Diagnosis not present

## 2021-06-07 DIAGNOSIS — R531 Weakness: Secondary | ICD-10-CM | POA: Diagnosis not present

## 2021-06-07 DIAGNOSIS — Z96651 Presence of right artificial knee joint: Secondary | ICD-10-CM | POA: Diagnosis not present

## 2021-06-07 DIAGNOSIS — R2689 Other abnormalities of gait and mobility: Secondary | ICD-10-CM | POA: Diagnosis not present

## 2021-06-11 DIAGNOSIS — Z9889 Other specified postprocedural states: Secondary | ICD-10-CM | POA: Diagnosis not present

## 2021-06-11 DIAGNOSIS — R531 Weakness: Secondary | ICD-10-CM | POA: Diagnosis not present

## 2021-06-11 DIAGNOSIS — Z96651 Presence of right artificial knee joint: Secondary | ICD-10-CM | POA: Diagnosis not present

## 2021-06-11 DIAGNOSIS — R2689 Other abnormalities of gait and mobility: Secondary | ICD-10-CM | POA: Diagnosis not present

## 2021-06-15 DIAGNOSIS — Z96651 Presence of right artificial knee joint: Secondary | ICD-10-CM | POA: Diagnosis not present

## 2021-06-15 DIAGNOSIS — R2689 Other abnormalities of gait and mobility: Secondary | ICD-10-CM | POA: Diagnosis not present

## 2021-06-15 DIAGNOSIS — Z9889 Other specified postprocedural states: Secondary | ICD-10-CM | POA: Diagnosis not present

## 2021-06-15 DIAGNOSIS — R531 Weakness: Secondary | ICD-10-CM | POA: Diagnosis not present

## 2021-06-18 DIAGNOSIS — R2689 Other abnormalities of gait and mobility: Secondary | ICD-10-CM | POA: Diagnosis not present

## 2021-06-18 DIAGNOSIS — R531 Weakness: Secondary | ICD-10-CM | POA: Diagnosis not present

## 2021-06-18 DIAGNOSIS — Z9889 Other specified postprocedural states: Secondary | ICD-10-CM | POA: Diagnosis not present

## 2021-06-18 DIAGNOSIS — Z96651 Presence of right artificial knee joint: Secondary | ICD-10-CM | POA: Diagnosis not present

## 2021-06-22 DIAGNOSIS — Z96651 Presence of right artificial knee joint: Secondary | ICD-10-CM | POA: Diagnosis not present

## 2021-06-22 DIAGNOSIS — Z9889 Other specified postprocedural states: Secondary | ICD-10-CM | POA: Diagnosis not present

## 2021-06-22 DIAGNOSIS — R2689 Other abnormalities of gait and mobility: Secondary | ICD-10-CM | POA: Diagnosis not present

## 2021-06-22 DIAGNOSIS — R531 Weakness: Secondary | ICD-10-CM | POA: Diagnosis not present

## 2021-06-25 DIAGNOSIS — R2689 Other abnormalities of gait and mobility: Secondary | ICD-10-CM | POA: Diagnosis not present

## 2021-06-25 DIAGNOSIS — Z96651 Presence of right artificial knee joint: Secondary | ICD-10-CM | POA: Diagnosis not present

## 2021-06-25 DIAGNOSIS — R531 Weakness: Secondary | ICD-10-CM | POA: Diagnosis not present

## 2021-06-25 DIAGNOSIS — Z9889 Other specified postprocedural states: Secondary | ICD-10-CM | POA: Diagnosis not present

## 2021-06-28 DIAGNOSIS — R2689 Other abnormalities of gait and mobility: Secondary | ICD-10-CM | POA: Diagnosis not present

## 2021-06-28 DIAGNOSIS — Z96651 Presence of right artificial knee joint: Secondary | ICD-10-CM | POA: Diagnosis not present

## 2021-06-28 DIAGNOSIS — Z9889 Other specified postprocedural states: Secondary | ICD-10-CM | POA: Diagnosis not present

## 2021-06-28 DIAGNOSIS — R531 Weakness: Secondary | ICD-10-CM | POA: Diagnosis not present

## 2021-07-02 DIAGNOSIS — R531 Weakness: Secondary | ICD-10-CM | POA: Diagnosis not present

## 2021-07-02 DIAGNOSIS — R2689 Other abnormalities of gait and mobility: Secondary | ICD-10-CM | POA: Diagnosis not present

## 2021-07-02 DIAGNOSIS — Z9889 Other specified postprocedural states: Secondary | ICD-10-CM | POA: Diagnosis not present

## 2021-07-02 DIAGNOSIS — Z96651 Presence of right artificial knee joint: Secondary | ICD-10-CM | POA: Diagnosis not present

## 2021-07-05 DIAGNOSIS — Z96651 Presence of right artificial knee joint: Secondary | ICD-10-CM | POA: Diagnosis not present

## 2021-07-05 DIAGNOSIS — R2689 Other abnormalities of gait and mobility: Secondary | ICD-10-CM | POA: Diagnosis not present

## 2021-07-05 DIAGNOSIS — Z9889 Other specified postprocedural states: Secondary | ICD-10-CM | POA: Diagnosis not present

## 2021-07-05 DIAGNOSIS — R531 Weakness: Secondary | ICD-10-CM | POA: Diagnosis not present

## 2021-07-09 DIAGNOSIS — Z9889 Other specified postprocedural states: Secondary | ICD-10-CM | POA: Diagnosis not present

## 2021-07-09 DIAGNOSIS — R2689 Other abnormalities of gait and mobility: Secondary | ICD-10-CM | POA: Diagnosis not present

## 2021-07-09 DIAGNOSIS — Z96651 Presence of right artificial knee joint: Secondary | ICD-10-CM | POA: Diagnosis not present

## 2021-07-09 DIAGNOSIS — R531 Weakness: Secondary | ICD-10-CM | POA: Diagnosis not present

## 2021-07-12 DIAGNOSIS — R2689 Other abnormalities of gait and mobility: Secondary | ICD-10-CM | POA: Diagnosis not present

## 2021-07-12 DIAGNOSIS — Z96651 Presence of right artificial knee joint: Secondary | ICD-10-CM | POA: Diagnosis not present

## 2021-07-12 DIAGNOSIS — R531 Weakness: Secondary | ICD-10-CM | POA: Diagnosis not present

## 2021-07-12 DIAGNOSIS — Z9889 Other specified postprocedural states: Secondary | ICD-10-CM | POA: Diagnosis not present

## 2021-07-13 DIAGNOSIS — H40023 Open angle with borderline findings, high risk, bilateral: Secondary | ICD-10-CM | POA: Diagnosis not present

## 2021-07-13 DIAGNOSIS — H04123 Dry eye syndrome of bilateral lacrimal glands: Secondary | ICD-10-CM | POA: Diagnosis not present

## 2021-07-13 DIAGNOSIS — H25813 Combined forms of age-related cataract, bilateral: Secondary | ICD-10-CM | POA: Diagnosis not present

## 2021-07-13 DIAGNOSIS — H40033 Anatomical narrow angle, bilateral: Secondary | ICD-10-CM | POA: Diagnosis not present

## 2021-07-16 ENCOUNTER — Other Ambulatory Visit: Payer: Self-pay | Admitting: Family Medicine

## 2021-07-17 NOTE — Telephone Encounter (Signed)
Courtesy refill. Future appt scheduled 11/06/21. Requested Prescriptions  Pending Prescriptions Disp Refills  . metoprolol succinate (TOPROL-XL) 100 MG 24 hr tablet [Pharmacy Med Name: METOPROLOL ER SUCCINATE '100MG'$  TABS] 90 tablet 0    Sig: TAKE 1 TABLET BY MOUTH EVERY DAY WITH OR IMMEDIATELY FOLLOWING A MEAL.     Cardiovascular:  Beta Blockers Failed - 07/16/2021 12:17 PM      Failed - Last BP in normal range    BP Readings from Last 1 Encounters:  05/21/21 (!) 148/86         Failed - Valid encounter within last 6 months    Recent Outpatient Visits          8 months ago Screening for AAA (abdominal aortic aneurysm)   [place] Susy Frizzle, MD   1 year ago Essential hypertension   [place], Mod[place] Nunnery, MD   1 year ago Routine general medical examination at a health care facility   [place], Modena Nunnery, MD   2 years ago Routine general medical examination at a health care facility   [place], Modena Nunnery, MD   3 years ago Essential hypertension   [place], Modena Nunnery, MD             Passed - Last Heart Rate in normal range    Pulse Readings from Last 1 Encounters:  05/21/21 76

## 2021-07-26 DIAGNOSIS — Z96651 Presence of right artificial knee joint: Secondary | ICD-10-CM | POA: Diagnosis not present

## 2021-07-26 DIAGNOSIS — Z471 Aftercare following joint replacement surgery: Secondary | ICD-10-CM | POA: Diagnosis not present

## 2021-08-04 ENCOUNTER — Other Ambulatory Visit: Payer: Self-pay | Admitting: Family Medicine

## 2021-08-06 NOTE — Telephone Encounter (Signed)
Requested Prescriptions  Pending Prescriptions Disp Refills  . amLODipine (NORVASC) 5 MG tablet [Pharmacy Med Name: AMLODIPINE BESYLATE '5MG'$  TABLETS] 90 tablet 0    Sig: TAKE 1 TABLET(5 MG) BY MOUTH DAILY     There is no refill protocol information for this order

## 2021-08-20 ENCOUNTER — Encounter: Payer: Self-pay | Admitting: Internal Medicine

## 2021-09-10 ENCOUNTER — Other Ambulatory Visit: Payer: Self-pay | Admitting: Family Medicine

## 2021-09-11 NOTE — Telephone Encounter (Signed)
Requested medication (s) are due for refill today - yes  Requested medication (s) are on the active medication list -yes  Future visit scheduled -yes  Last refill: 05/11/21 #30 2RF  Notes to clinic: non delegated Rx  Requested Prescriptions  Pending Prescriptions Disp Refills   ALPRAZolam (XANAX) 0.5 MG tablet [Pharmacy Med Name: ALPRAZOLAM 0.'5MG'$  TABLETS] 30 tablet     Sig: TAKE 1 TABLET(0.5 MG) BY MOUTH THREE TIMES DAILY AS NEEDED FOR ANXIETY     Not Delegated - Psychiatry: Anxiolytics/Hypnotics 2 Failed - 09/10/2021  6:50 AM      Failed - This refill cannot be delegated      Failed - Urine Drug Screen completed in last 360 days      Failed - Valid encounter within last 6 months    Recent Outpatient Visits           10 months ago Screening for AAA (abdominal aortic aneurysm)   [place] Susy Frizzle, MD   1 year ago Essential hypertension   [place], Modena Nunnery, MD   1 year ago Routine general medical examination at a health care facility   [place], Modena Nunnery, MD   2 years ago Routine general medical examination at a health care facility   [place], Modena Nunnery, MD   3 years ago Essential hypertension   [place], Modena Nunnery, MD              Passed - Patient is not pregnant         Requested Prescriptions  Pending Prescriptions Disp Refills   ALPRAZolam Duanne Moron) 0.5 MG tablet [Pharmacy Med Name: ALPRAZOLAM 0.'5MG'$  TABLETS] 30 tablet     Sig: TAKE 1 TABLET(0.5 MG) BY MOUTH THREE TIMES DAILY AS NEEDED FOR ANXIETY     Not Delegated - Psychiatry: Anxiolytics/Hypnotics 2 Failed - 09/10/2021  6:50 AM      Failed - This refill cannot be delegated      Failed - Urine Drug Screen completed in last 360 days      Failed - Valid encounter within last 6 months    Recent Outpatient Visits           10 months ago Screening for AAA (abdominal aortic  aneurysm)   [place] Susy Frizzle, MD   1 year ago Essential hypertension   [place], Modena Nunnery, MD   1 year ago Routine general medical examination at a health care facility   [place], Modena Nunnery, MD   2 years ago Routine general medical examination at a health care facility   [place], Modena Nunnery, MD   3 years ago Essential hypertension   [place], Modena Nunnery, MD              Passed - Patient is not pregnant

## 2021-10-10 ENCOUNTER — Other Ambulatory Visit: Payer: Self-pay | Admitting: Family Medicine

## 2021-10-11 NOTE — Telephone Encounter (Signed)
Pt has future OV scheduled, will refill medication.  Requested Prescriptions  Pending Prescriptions Disp Refills  . metoprolol succinate (TOPROL-XL) 100 MG 24 hr tablet [Pharmacy Med Name: METOPROLOL ER SUCCINATE '100MG'$  TABS] 90 tablet 0    Sig: TAKE 1 TABLET BY MOUTH EVERY DAY WITH OR IMMEDIATELY FOLLOWING A MEAL     Cardiovascular:  Beta Blockers Failed - 10/10/2021  2:40 PM      Failed - Last BP in normal range    BP Readings from Last 1 Encounters:  05/21/21 (!) 148/86         Failed - Valid encounter within last 6 months    Recent Outpatient Visits          11 months ago Screening for AAA (abdominal aortic aneurysm)   [place] Susy Frizzle, MD   1 year ago Essential hypertension   [place], Modena Nunnery, MD   1 year ago Routine general medical examination at a health care facility   [place], Modena Nunnery, MD   3 years ago Routine general medical examination at a health care facility   [place], Modena Nunnery, MD   3 years ago Essential hypertension   [place], Modena Nunnery, MD             Passed - Last Heart Rate in normal range    Pulse Readings from Last 1 Encounters:  05/21/21 76

## 2021-11-03 ENCOUNTER — Other Ambulatory Visit: Payer: Self-pay | Admitting: Family Medicine

## 2021-11-05 ENCOUNTER — Other Ambulatory Visit: Payer: Self-pay | Admitting: Family Medicine

## 2021-11-05 NOTE — Telephone Encounter (Signed)
Pt needs annual w/pcp for med eval/refills

## 2021-11-05 NOTE — Telephone Encounter (Signed)
CPE already scheduled for 11/06/21.

## 2021-11-06 ENCOUNTER — Ambulatory Visit (INDEPENDENT_AMBULATORY_CARE_PROVIDER_SITE_OTHER): Payer: Medicare Other | Admitting: Family Medicine

## 2021-11-06 VITALS — BP 130/80 | HR 69 | Temp 98.4°F | Ht 74.0 in | Wt 212.6 lb

## 2021-11-06 DIAGNOSIS — Z0000 Encounter for general adult medical examination without abnormal findings: Secondary | ICD-10-CM | POA: Diagnosis not present

## 2021-11-06 DIAGNOSIS — I10 Essential (primary) hypertension: Secondary | ICD-10-CM | POA: Diagnosis not present

## 2021-11-06 DIAGNOSIS — Z23 Encounter for immunization: Secondary | ICD-10-CM | POA: Diagnosis not present

## 2021-11-06 DIAGNOSIS — Z125 Encounter for screening for malignant neoplasm of prostate: Secondary | ICD-10-CM

## 2021-11-06 NOTE — Progress Notes (Signed)
Subjective:    Patient ID: Nathan Stewart, male    DOB: January 18, 1956, 66 y.o.   MRN: [id number]  HPI  Patient is a very pleasant 66 year old African-American gentleman here today for a physical exam.  He denies any falls or depression or memory loss.  He has a history of remote smoking although he quit 1999.  Patient had a colonoscopy in 2019.  They found 1 tubular adenoma.  His gastroenterologist recently recommended waiting till 2025 to repeat his colonoscopy after 7 years.  He is due for prostate cancer screening.  Due to his remote history of smoking he does not require lung cancer screening.  He had AAA screening last year that was negative.  He is due for a flu shot.  He is due for Prevnar 20.  He is due for Shingrix.  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   GEN OSTEOARTHROSIS INVOLVING MULTIPLE SITES 11/10/2007   GERD 01/01/2007   Hx of adenomatous polyp of colon 04/04/2016   Hypertension    INSOMNIA, CHRONIC 11/13/2009   LIVER FUNCTION TESTS, ABNORMAL 08/27/2007   LOC OSTEOARTHROS NOT SPEC PRIM/SEC LOWER LEG 11/10/2007   Memory loss 08/27/2007   MENISCUS TEAR 10/30/2006   OSTEOARTHRITIS 06/07/2008   Pneumonia    SLEEP APNEA 09/20/2009   TESTICULAR HYPOFUNCTION 08/14/2009   Past Surgical History:  Procedure Laterality Date   COLONOSCOPY     HAND SURGERY  over 20 years ago   'metal plate in wrist'   JOINT REPLACEMENT N/A    [employer]reesia 10/19/2019   MULTIPLE TOOTH EXTRACTIONS     SHOULDER SURGERY Right 2021   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   Procedure: TOTAL KNEE ARTHROPLASTY;  Surgeon: Frederik Pear, MD;  Location: [facility];  Service: Orthopedics;  Laterality: Left;   TOTAL KNEE ARTHROPLASTY Right 05/21/2021   Procedure: RIGHT TOTAL KNEE ARTHROPLASTY;  Surgeon: Frederik Pear, MD;  Location: [facility];  Service: Orthopedics;  Laterality: Right;   Current Outpatient Medications on File Prior to  Visit  Medication Sig Dispense Refill   ALPRAZolam (XANAX) 0.5 MG tablet TAKE 1 TABLET(0.5 MG) BY MOUTH THREE TIMES DAILY AS NEEDED FOR ANXIETY 30 tablet 2   amLODipine (NORVASC) 5 MG tablet TAKE 1 TABLET(5 MG) BY MOUTH DAILY 30 tablet 0   Bioflavonoid Products (VITAMIN C PLUS) 1000 MG TABS Take 1 tablet by mouth daily. Vitamin C, Zinc, Elderberry     COVID-19 mRNA bivalent vaccine, Pfizer, (PFIZER COVID-19 VAC BIVALENT) injection Inject into the muscle. 0.3 mL 0   Glucosamine 500 MG CAPS Take 2 capsules by mouth daily.      hydrochlorothiazide (HYDRODIURIL) 25 MG tablet TAKE 1 TABLET(25 MG) BY MOUTH DAILY 90 tablet 3   metoprolol succinate (TOPROL-XL) 100 MG 24 hr tablet TAKE 1 TABLET BY MOUTH EVERY DAY WITH OR IMMEDIATELY FOLLOWING A MEAL 90 tablet 0   Multiple Vitamin (MULTIVITAMIN) tablet Take 1 tablet by mouth daily.     Omega-3 Fatty Acids (FISH OIL) 1000 MG CPDR Take 1,000 mg by mouth daily.     sodium chloride (OCEAN) 0.65 % SOLN nasal spray Place 1 spray into both nostrils as needed for congestion.     Turmeric (QC TUMERIC COMPLEX PO) Take 1 capsule by mouth daily.     Current Facility-Administered Medications on File Prior to Visit  Medication Dose Route Frequency Provider Last Rate Last Admin  0.9 %  sodium chloride infusion  500 mL Intravenous Continuous Gatha Mayer, MD       .all Social History   Socioeconomic History   Marital status: Married    Spouse name: Not on file   Number of children: Not on file   Years of education: Not on file   Highest education level: Not on file  Occupational History   Not on file  Tobacco Use   Smoking status: Former    Types: Cigarettes    Quit date: 02/25/1997    Years since quitting: 24.7   Smokeless tobacco: Never   Tobacco comments:    quit 1999  Vaping Use   Vaping Use: Never used  Substance and Sexual Activity   Alcohol use: Yes    Alcohol/week: 6.0 standard drinks of alcohol    Types: 6 Cans of beer per week     Comment: occasional   Drug use: No   Sexual activity: Yes  Other Topics Concern   Not on file  Social History Narrative   Not on file   Social Determinants of Health   Financial Resource Strain: Not on file  Food Insecurity: Not on file  Transportation Needs: Not on file  Physical Activity: Not on file  Stress: Not on file  Social Connections: Not on file  Intimate Partner Violence: Not on file   Family History  Problem Relation Age of Onset   Arthritis Mother    Heart disease Mother    Hypertension Mother    Hypertension Father    Stroke Father    Hypertension Sister    Stroke Brother    Colon cancer Paternal Uncle       Review of Systems  All other systems reviewed and are negative.      Objective:   Physical Exam Vitals reviewed.  Constitutional:      General: He is not in acute distress.    Appearance: Normal appearance. He is normal weight. He is not ill-appearing or toxic-appearing.  HENT:     Head: Normocephalic and atraumatic.     Right Ear: Tympanic membrane and ear canal normal.     Left Ear: Tympanic membrane and ear canal normal.     Nose: Nose normal. No congestion or rhinorrhea.     Mouth/Throat:     Mouth: Mucous membranes are moist.     Pharynx: Oropharynx is clear. No oropharyngeal exudate or posterior oropharyngeal erythema.  Eyes:     Extraocular Movements: Extraocular movements intact.     Conjunctiva/sclera: Conjunctivae normal.     Pupils: Pupils are equal, round, and reactive to light.  Neck:     Vascular: No carotid bruit.  Cardiovascular:     Rate and Rhythm: Normal rate and regular rhythm.     Pulses: Normal pulses.     Heart sounds: Normal heart sounds. No murmur heard.    No friction rub. No gallop.  Pulmonary:     Effort: Pulmonary effort is normal. No respiratory distress.     Breath sounds: Normal breath sounds. No wheezing, rhonchi or rales.  Abdominal:     General: Abdomen is flat. Bowel sounds are normal. There is no  distension.     Palpations: Abdomen is soft.     Tenderness: There is no abdominal tenderness. There is no rebound.     Hernia: No hernia is present.  Musculoskeletal:     Cervical back: Neck supple.     Right lower leg: No edema.  Left lower leg: No edema.  Lymphadenopathy:     Cervical: No cervical adenopathy.  Skin:    Coloration: Skin is not jaundiced or pale.     Findings: No bruising, erythema, lesion or rash.  Neurological:     General: No focal deficit present.     Mental Status: He is alert and oriented to person, place, and time. Mental status is at baseline.     Cranial Nerves: No cranial nerve deficit.     Motor: No weakness.     Gait: Gait normal.  Psychiatric:        Mood and Affect: Mood normal.        Behavior: Behavior normal.        Thought Content: Thought content normal.        Judgment: Judgment normal.           Assessment & Plan:  Routine general medical examination at a health care facility  Prostate cancer screening - Plan: PSA  Essential hypertension - Plan: CBC with Differential/Platelet, Lipid panel, COMPLETE METABOLIC PANEL WITH GFR Patient's AAA screening was negative last year.  He does not require lung cancer screening.  Next colonoscopy is due in 2 years.  I recommended a flu shot, Prevnar 20, Shingrix, and a COVID booster.  He declined Shingrix due to a previous history of an allergic reaction to Zostavax.  He received Prevnar 20 today.  He defers this flu shot till later.  He will check on the COVID booster when it becomes available.  Check CBC CMP and a lipid panel.  Blood pressure today is excellent.

## 2021-11-06 NOTE — Addendum Note (Signed)
Addended by: Randal Buba K on: 11/06/2021 09:02 AM   Modules accepted: Orders

## 2021-11-07 LAB — COMPLETE METABOLIC PANEL WITH GFR
AG Ratio: 1.8 (calc) (ref 1.0–2.5)
ALT: 111 U/L — ABNORMAL HIGH (ref 9–46)
AST: 72 U/L — ABNORMAL HIGH (ref 10–35)
Albumin: 5 g/dL (ref 3.6–5.1)
Alkaline phosphatase (APISO): 77 U/L (ref 35–144)
BUN: 14 mg/dL (ref 7–25)
CO2: 23 mmol/L (ref 20–32)
Calcium: 10.7 mg/dL — ABNORMAL HIGH (ref 8.6–10.3)
Chloride: 102 mmol/L (ref 98–110)
Creat: 0.84 mg/dL (ref 0.70–1.35)
Globulin: 2.8 g/dL (calc) (ref 1.9–3.7)
Glucose, Bld: 110 mg/dL — ABNORMAL HIGH (ref 65–99)
Potassium: 4.2 mmol/L (ref 3.5–5.3)
Sodium: 138 mmol/L (ref 135–146)
Total Bilirubin: 0.8 mg/dL (ref 0.2–1.2)
Total Protein: 7.8 g/dL (ref 6.1–8.1)
eGFR: 96 mL/min/{1.73_m2} (ref >=60)

## 2021-11-07 LAB — CBC WITH DIFFERENTIAL/PLATELET
Absolute Monocytes: 475 cells/uL (ref 200–950)
Basophils Absolute: 53 cells/uL (ref 0–200)
Basophils Relative: 0.8 %
Eosinophils Absolute: 310 cells/uL (ref 15–500)
Eosinophils Relative: 4.7 %
HCT: 42 % (ref 38.5–50.0)
Hemoglobin: 14.7 g/dL (ref 13.2–17.1)
Lymphs Abs: 1544 cells/uL (ref 850–3900)
MCH: 31.5 pg (ref 27.0–33.0)
MCHC: 35 g/dL (ref 32.0–36.0)
MCV: 89.9 fL (ref 80.0–100.0)
MPV: 10.9 fL (ref 7.5–12.5)
Monocytes Relative: 7.2 %
Neutro Abs: 4217 cells/uL (ref 1500–7800)
Neutrophils Relative %: 63.9 %
Platelets: 221 10*3/uL (ref 140–400)
RBC: 4.67 10*6/uL (ref 4.20–5.80)
RDW: 12.1 % (ref 11.0–15.0)
Total Lymphocyte: 23.4 %
WBC: 6.6 10*3/uL (ref 3.8–10.8)

## 2021-11-07 LAB — LIPID PANEL
Cholesterol: 195 mg/dL (ref <200)
HDL: 44 mg/dL (ref >=40)
LDL Cholesterol (Calc): 115 mg/dL (calc) — ABNORMAL HIGH
Non-HDL Cholesterol (Calc): 151 mg/dL (calc) — ABNORMAL HIGH (ref <130)
Total CHOL/HDL Ratio: 4.4 (calc) (ref <5.0)
Triglycerides: 238 mg/dL — ABNORMAL HIGH (ref <150)

## 2021-11-07 LAB — PSA: PSA: 0.53 ng/mL (ref <=4.00)

## 2021-11-08 ENCOUNTER — Other Ambulatory Visit: Payer: Self-pay | Admitting: Family Medicine

## 2021-11-08 ENCOUNTER — Other Ambulatory Visit: Payer: Self-pay

## 2021-11-08 DIAGNOSIS — R7989 Other specified abnormal findings of blood chemistry: Secondary | ICD-10-CM

## 2021-11-14 ENCOUNTER — Telehealth: Payer: Self-pay

## 2021-11-14 ENCOUNTER — Ambulatory Visit
Admission: RE | Admit: 2021-11-14 | Discharge: 2021-11-14 | Disposition: A | Discharge status: Home / Self Care | Payer: Medicare Other | Source: Ambulatory Visit | Attending: Family Medicine | Admitting: Family Medicine

## 2021-11-14 DIAGNOSIS — R945 Abnormal results of liver function studies: Secondary | ICD-10-CM | POA: Diagnosis not present

## 2021-11-14 DIAGNOSIS — K8020 Calculus of gallbladder without cholecystitis without obstruction: Secondary | ICD-10-CM | POA: Diagnosis not present

## 2021-11-14 DIAGNOSIS — R7989 Other specified abnormal findings of blood chemistry: Secondary | ICD-10-CM

## 2021-11-14 NOTE — Telephone Encounter (Signed)
Pt called in to inquire about this med  amLODipine (NORVASC) 5 MG tablet [[id number]]    Order Details Dose, Route, Frequency: As Directed  Dispense Quantity: 30 tablet Refills: 0    Pt states that this med was refused for a 90 day supply. Pt has had his cpe and labs done on 11/06/21.  Please advise  Cb#: [phone]

## 2021-11-16 ENCOUNTER — Encounter: Payer: Self-pay | Admitting: Family Medicine

## 2021-11-19 ENCOUNTER — Telehealth: Payer: Self-pay

## 2021-11-19 NOTE — Telephone Encounter (Signed)
Called this morning and LVM for pt regarding his [place] lab results.

## 2021-11-20 ENCOUNTER — Other Ambulatory Visit: Payer: Self-pay

## 2021-11-20 DIAGNOSIS — I10 Essential (primary) hypertension: Secondary | ICD-10-CM

## 2021-11-20 MED ORDER — AMLODIPINE BESYLATE 5 MG PO TABS: ORAL_TABLET | ORAL | 0 refills | Status: DC

## 2021-11-22 DIAGNOSIS — Z96651 Presence of right artificial knee joint: Secondary | ICD-10-CM | POA: Diagnosis not present

## 2021-11-22 DIAGNOSIS — Z96653 Presence of artificial knee joint, bilateral: Secondary | ICD-10-CM | POA: Diagnosis not present

## 2021-11-28 ENCOUNTER — Other Ambulatory Visit: Payer: Self-pay | Admitting: Family Medicine

## 2021-11-28 DIAGNOSIS — I10 Essential (primary) hypertension: Secondary | ICD-10-CM

## 2021-11-28 NOTE — Telephone Encounter (Signed)
Called patient to schedule appt for medication refills/ annual exam. No answer, LVMTCB [phone].

## 2021-11-28 NOTE — Telephone Encounter (Signed)
Requested medication (s) are due for refill today: yes  Requested medication (s) are on the active medication list: yes  Last refill:  11/20/21 #30 0 refills  Future visit scheduled: yes in 11 months   Notes to clinic:  do you want another courtesy refill? No appt until 11 months. Called patient to schedule earlier appt no answer. LVMTCB     Requested Prescriptions  Pending Prescriptions Disp Refills   amLODipine (NORVASC) 5 MG tablet [Pharmacy Med Name: AMLODIPINE BESYLATE '5MG'$  TABLETS] 90 tablet     Sig: TAKE 1 TABLET(5 MG) BY MOUTH DAILY     Cardiovascular: Calcium Channel Blockers 2 Failed - 11/28/2021  9:11 AM      Failed - Valid encounter within last 6 months    Recent Outpatient Visits           1 year ago Screening for AAA (abdominal aortic aneurysm)   [place] Susy Frizzle, MD   1 year ago Essential hypertension   [place], Modena Nunnery, MD   2 years ago Routine general medical examination at a health care facility   [place], Modena Nunnery, MD   3 years ago Routine general medical examination at a health care facility   [place], Modena Nunnery, MD   3 years ago Essential hypertension   [place], Modena Nunnery, MD       Future Appointments             In 33 months Pickard, Cammie Mcgee, MD [place]            Passed - Last BP in normal range    BP Readings from Last 1 Encounters:  11/06/21 130/80         Passed - Last Heart Rate in normal range    Pulse Readings from Last 1 Encounters:  11/06/21 69

## 2021-12-03 ENCOUNTER — Other Ambulatory Visit: Payer: Self-pay

## 2021-12-03 DIAGNOSIS — I10 Essential (primary) hypertension: Secondary | ICD-10-CM

## 2021-12-03 MED ORDER — AMLODIPINE BESYLATE 5 MG PO TABS: ORAL_TABLET | ORAL | 3 refills | Status: DC

## 2021-12-03 NOTE — Telephone Encounter (Signed)
Pt called in stating that he didn't understand why he keeps receiving phone calls about scheduling a cpe to get his refills. Pt has had his cpe and labs by his pcp (11/06/21). Pt would like to know if this refill is going to be sent in. Please advise.  amLODipine (NORVASC) 5 MG tablet [[id number]]    Order Details Dose, Route, Frequency: As Directed  Dispense Quantity: 30 tablet Refills: 0   Note to Pharmacy: Pt needs annual w/pcp for   LOV: CPE 11/06/21  PHARMACY: [id number] - [place], [place] - [address] AT [address]

## 2021-12-03 NOTE — Telephone Encounter (Signed)
Requested Prescriptions  Pending Prescriptions Disp Refills  . amLODipine (NORVASC) 5 MG tablet 90 tablet 3    Sig: TAKE 1 TABLET(5 MG) BY MOUTH DAILY     Cardiovascular: Calcium Channel Blockers 2 Failed - 12/03/2021 12:12 PM      Failed - Valid encounter within last 6 months    Recent Outpatient Visits          1 year ago Screening for AAA (abdominal aortic aneurysm)   [place] Susy Frizzle, MD   1 year ago Essential hypertension   [place], Modena Nunnery, MD   2 years ago Routine general medical examination at a health care facility   [place], Modena Nunnery, MD   3 years ago Routine general medical examination at a health care facility   [place], Modena Nunnery, MD   3 years ago Essential hypertension   [place], Modena Nunnery, MD      Future Appointments            In 42 months Pickard, Cammie Mcgee, MD [place], [facility]           Passed - Last BP in normal range    BP Readings from Last 1 Encounters:  11/06/21 130/80         Passed - Last Heart Rate in normal range    Pulse Readings from Last 1 Encounters:  11/06/21 69

## 2021-12-20 ENCOUNTER — Other Ambulatory Visit: Payer: Self-pay | Admitting: Family Medicine

## 2021-12-31 ENCOUNTER — Other Ambulatory Visit: Payer: Self-pay | Admitting: Family Medicine

## 2021-12-31 DIAGNOSIS — I10 Essential (primary) hypertension: Secondary | ICD-10-CM

## 2021-12-31 NOTE — Telephone Encounter (Signed)
Refilled 12/03/2021 #90 3 rf - confirmed by same pharmacy. Requested Prescriptions  Pending Prescriptions Disp Refills   amLODipine (NORVASC) 5 MG tablet [Pharmacy Med Name: AMLODIPINE BESYLATE '5MG'$  TABLETS] 30 tablet     Sig: TAKE 1 TABLET(5 MG) BY MOUTH DAILY     Cardiovascular: Calcium Channel Blockers 2 Failed - 12/31/2021  3:32 AM      Failed - Valid encounter within last 6 months    Recent Outpatient Visits           1 year ago Screening for AAA (abdominal aortic aneurysm)   [place] Susy Frizzle, MD   1 year ago Essential hypertension   [place], Modena Nunnery, MD   2 years ago Routine general medical examination at a health care facility   [place], Modena Nunnery, MD   3 years ago Routine general medical examination at a health care facility   [place], Modena Nunnery, MD   3 years ago Essential hypertension   [place], Modena Nunnery, MD       Future Appointments             In 10 months Dennard Schaumann, Cammie Mcgee, MD [place]            Passed - Last BP in normal range    BP Readings from Last 1 Encounters:  11/06/21 130/80         Passed - Last Heart Rate in normal range    Pulse Readings from Last 1 Encounters:  11/06/21 69

## 2022-01-06 ENCOUNTER — Other Ambulatory Visit: Payer: Self-pay | Admitting: Family Medicine

## 2022-01-07 NOTE — Telephone Encounter (Signed)
Requested Prescriptions  Pending Prescriptions Disp Refills   metoprolol succinate (TOPROL-XL) 100 MG 24 hr tablet [Pharmacy Med Name: METOPROLOL ER SUCCINATE '100MG'$  TABS] 90 tablet 1    Sig: TAKE 1 TABLET BY MOUTH EVERY DAY WITH OR IMMEDIATELY FOLLOWING A MEAL     Cardiovascular:  Beta Blockers Failed - 01/06/2022  3:32 AM      Failed - Valid encounter within last 6 months    Recent Outpatient Visits           1 year ago Screening for AAA (abdominal aortic aneurysm)   [place] Susy Frizzle, MD   1 year ago Essential hypertension   [place], Modena Nunnery, MD   2 years ago Routine general medical examination at a health care facility   [place], Modena Nunnery, MD   3 years ago Routine general medical examination at a health care facility   [place], Modena Nunnery, MD   3 years ago Essential hypertension   [place], Modena Nunnery, MD       Future Appointments             In 10 months Dennard Schaumann, Cammie Mcgee, MD [place], [facility]            Passed - Last BP in normal range    BP Readings from Last 1 Encounters:  11/06/21 130/80         Passed - Last Heart Rate in normal range    Pulse Readings from Last 1 Encounters:  11/06/21 69

## 2022-01-23 ENCOUNTER — Other Ambulatory Visit (HOSPITAL_BASED_OUTPATIENT_CLINIC_OR_DEPARTMENT_OTHER): Payer: Self-pay

## 2022-01-23 MED ORDER — INFLUENZA VAC A&B SA ADJ QUAD 0.5 ML IM PRSY
PREFILLED_SYRINGE | INTRAMUSCULAR | 0 refills | Status: DC
  Filled 2022-01-23: qty 0.5, 1d supply, fill #0

## 2022-01-23 MED ORDER — COMIRNATY 30 MCG/0.3ML IM SUSY
PREFILLED_SYRINGE | INTRAMUSCULAR | 0 refills | Status: DC
  Filled 2022-01-23: qty 0.3, 1d supply, fill #0

## 2022-02-07 DIAGNOSIS — H40023 Open angle with borderline findings, high risk, bilateral: Secondary | ICD-10-CM | POA: Diagnosis not present

## 2022-02-07 DIAGNOSIS — H25813 Combined forms of age-related cataract, bilateral: Secondary | ICD-10-CM | POA: Diagnosis not present

## 2022-02-07 DIAGNOSIS — H43813 Vitreous degeneration, bilateral: Secondary | ICD-10-CM | POA: Diagnosis not present

## 2022-02-07 DIAGNOSIS — H524 Presbyopia: Secondary | ICD-10-CM | POA: Diagnosis not present

## 2022-02-07 DIAGNOSIS — H40033 Anatomical narrow angle, bilateral: Secondary | ICD-10-CM | POA: Diagnosis not present

## 2022-02-07 LAB — HM DIABETES EYE EXAM

## 2022-03-01 ENCOUNTER — Other Ambulatory Visit: Payer: Self-pay | Admitting: Family Medicine

## 2022-03-01 NOTE — Telephone Encounter (Signed)
Requested medication (s) are due for refill today: yes  Requested medication (s) are on the active medication list: yes  Last refill:  12/21/21 #30 1 RF  Future visit scheduled: yes  Notes to clinic:  med not delegated to NT to Reorder   Requested Prescriptions  Pending Prescriptions Disp Refills   ALPRAZolam (XANAX) 0.5 MG tablet [Pharmacy Med Name: ALPRAZOLAM 0.'5MG'$  TABLETS] 30 tablet     Sig: TAKE 1 TABLET(0.5 MG) BY MOUTH THREE TIMES DAILY AS NEEDED FOR ANXIETY     Not Delegated - Psychiatry: Anxiolytics/Hypnotics 2 Failed - 03/01/2022  9:04 AM      Failed - This refill cannot be delegated      Failed - Urine Drug Screen completed in last 360 days      Failed - Valid encounter within last 6 months    Recent Outpatient Visits           1 year ago Screening for AAA (abdominal aortic aneurysm)   [place] Susy Frizzle, MD   1 year ago Essential hypertension   [place], Modena Nunnery, MD   2 years ago Routine general medical examination at a health care facility   [place], Modena Nunnery, MD   3 years ago Routine general medical examination at a health care facility   [place], Modena Nunnery, MD   3 years ago Essential hypertension   [place], Modena Nunnery, MD       Future Appointments             In 8 months Pickard, Cammie Mcgee, MD [place], [place] - Patient is not pregnant

## 2022-03-06 DIAGNOSIS — M25612 Stiffness of left shoulder, not elsewhere classified: Secondary | ICD-10-CM | POA: Diagnosis not present

## 2022-03-06 DIAGNOSIS — R531 Weakness: Secondary | ICD-10-CM | POA: Diagnosis not present

## 2022-03-06 DIAGNOSIS — M6281 Muscle weakness (generalized): Secondary | ICD-10-CM | POA: Diagnosis not present

## 2022-03-06 DIAGNOSIS — Z96612 Presence of left artificial shoulder joint: Secondary | ICD-10-CM | POA: Diagnosis not present

## 2022-03-12 ENCOUNTER — Ambulatory Visit (INDEPENDENT_AMBULATORY_CARE_PROVIDER_SITE_OTHER): Payer: Medicare Other | Admitting: Family Medicine

## 2022-03-12 ENCOUNTER — Encounter: Payer: Self-pay | Admitting: Family Medicine

## 2022-03-12 VITALS — BP 162/92 | HR 89 | Temp 97.9°F | Ht 74.0 in | Wt 226.0 lb

## 2022-03-12 DIAGNOSIS — M542 Cervicalgia: Secondary | ICD-10-CM | POA: Diagnosis not present

## 2022-03-12 MED ORDER — CYCLOBENZAPRINE HCL 5 MG PO TABS: 5.0000 mg | ORAL_TABLET | Freq: Three times a day (TID) | ORAL | 0 refills | Status: DC | PRN

## 2022-03-12 NOTE — Assessment & Plan Note (Signed)
No red flag symptoms. Based on his exam I suspect this is musculoskeletal pain. Will start Flexeril '5mg'$  PRN TID. Instructed to use caution while taking this medication and discussed the risks of muscle relaxants. Instructed to return to office if symptoms persist or worsen.

## 2022-03-12 NOTE — Progress Notes (Signed)
Acute Office Visit  Subjective:     Patient ID: Nathan Stewart, male    DOB: Sep 07, 1955, 67 y.o.   MRN: [id number]  Chief Complaint  Patient presents with   Acute Visit    Crick in neck for 1 week    HPI Patient is in today for 1 week of left neck pain from behind his ear to above his shoulder. He was stiff when he first woke up after falling asleep in a recliner. The pain has been improving slowly over the week. Denies trauma, numbness or shooting pain, confusion, fever, nausea, vomiting, or chest pain. Has tried Ibuprofen without relief and heating pad, cold pad, and electrical stimulator.  Review of Systems  All other systems reviewed and are negative.  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   GEN OSTEOARTHROSIS INVOLVING MULTIPLE SITES 11/10/2007   GERD 01/01/2007   Hx of adenomatous polyp of colon 04/04/2016   Hypertension    INSOMNIA, CHRONIC 11/13/2009   LIVER FUNCTION TESTS, ABNORMAL 08/27/2007   LOC OSTEOARTHROS NOT SPEC PRIM/SEC LOWER LEG 11/10/2007   Memory loss 08/27/2007   MENISCUS TEAR 10/30/2006   OSTEOARTHRITIS 06/07/2008   Pneumonia    SLEEP APNEA 09/20/2009   TESTICULAR HYPOFUNCTION 08/14/2009   Past Surgical History:  Procedure Laterality Date   COLONOSCOPY     HAND SURGERY  over 20 years ago   'metal plate in wrist'   JOINT REPLACEMENT N/A    [employer]reesia 10/19/2019   MULTIPLE TOOTH EXTRACTIONS     SHOULDER SURGERY Right 2021   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   Procedure: TOTAL KNEE ARTHROPLASTY;  Surgeon: Frederik Pear, MD;  Location: [facility];  Service: Orthopedics;  Laterality: Left;   TOTAL KNEE ARTHROPLASTY Right 05/21/2021   Procedure: RIGHT TOTAL KNEE ARTHROPLASTY;  Surgeon: Frederik Pear, MD;  Location: [facility];  Service: Orthopedics;  Laterality: Right;   Current Outpatient Medications on File Prior to Visit  Medication Sig Dispense Refill   ALPRAZolam (XANAX) 0.5 MG tablet TAKE 1  TABLET(0.5 MG) BY MOUTH THREE TIMES DAILY AS NEEDED FOR ANXIETY 30 tablet 2   amLODipine (NORVASC) 5 MG tablet TAKE 1 TABLET(5 MG) BY MOUTH DAILY 90 tablet 3   Bioflavonoid Products (VITAMIN C PLUS) 1000 MG TABS Take 1 tablet by mouth daily. Vitamin C, Zinc, Elderberry     COVID-19 mRNA bivalent vaccine, Pfizer, (PFIZER COVID-19 VAC BIVALENT) injection Inject into the muscle. 0.3 mL 0   COVID-19 mRNA vaccine 2023-2024 (COMIRNATY) syringe Inject into the muscle. 0.3 mL 0   Glucosamine 500 MG CAPS Take 2 capsules by mouth daily.      hydrochlorothiazide (HYDRODIURIL) 25 MG tablet TAKE 1 TABLET(25 MG) BY MOUTH DAILY 90 tablet 3   influenza vaccine adjuvanted (FLUAD) 0.5 ML injection Inject into the muscle. 0.5 mL 0   metoprolol succinate (TOPROL-XL) 100 MG 24 hr tablet TAKE 1 TABLET BY MOUTH EVERY DAY WITH OR IMMEDIATELY FOLLOWING A MEAL 90 tablet 1   Multiple Vitamin (MULTIVITAMIN) tablet Take 1 tablet by mouth daily.     Omega-3 Fatty Acids (FISH OIL) 1000 MG CPDR Take 1,000 mg by mouth daily.     sodium chloride (OCEAN) 0.65 % SOLN nasal spray Place 1 spray into both nostrils as needed for congestion.     Turmeric (QC TUMERIC COMPLEX PO) Take 1 capsule by mouth daily.     Current Facility-Administered Medications on File Prior to Visit  Medication Dose Route Frequency Provider Last Rate Last Admin   0.9 %  sodium chloride infusion  500 mL Intravenous Continuous Gatha Mayer, MD       Allergies  Allergen Reactions   Irbesartan     Tingling, swelling  Pt unaware of allergy reaction    Percocet [Oxycodone-Acetaminophen] Nausea And Vomiting   Shingrix [Zoster Vac Recomb Adjuvanted] Itching        Objective:    BP (!) 162/92   Pulse 89   Temp 97.9 F (36.6 C) (Oral)   Ht '6\' 2"'$  (1.88 m)   Wt 226 lb (102.5 kg)   SpO2 97%   BMI 29.02 kg/m    Physical Exam Vitals and nursing note reviewed.  Constitutional:      Appearance: Normal appearance. He is normal weight.  HENT:      Head: Normocephalic and atraumatic.  Pulmonary:     Effort: Pulmonary effort is normal.  Musculoskeletal:     Cervical back: Neck supple. No rigidity or tenderness. Muscular tenderness present. No spinous process tenderness. Normal range of motion.  Skin:    General: Skin is warm and dry.     Capillary Refill: Capillary refill takes less than 2 seconds.  Neurological:     General: No focal deficit present.     Mental Status: He is alert and oriented to person, place, and time. Mental status is at baseline.  Psychiatric:        Mood and Affect: Mood normal.        Behavior: Behavior normal.        Thought Content: Thought content normal.        Judgment: Judgment normal.     No results found for any visits on 03/12/22.      Assessment & Plan:   Problem List Items Addressed This Visit       Other   Neck pain on left side - Primary    No red flag symptoms. Based on his exam I suspect this is musculoskeletal pain. Will start Flexeril '5mg'$  PRN TID. Instructed to use caution while taking this medication and discussed the risks of muscle relaxants. Instructed to return to office if symptoms persist or worsen.        Meds ordered this encounter  Medications   cyclobenzaprine (FLEXERIL) 5 MG tablet    Sig: Take 1 tablet (5 mg total) by mouth 3 (three) times daily as needed for muscle spasms.    Dispense:  30 tablet    Refill:  0    Order Specific Question:   Supervising Provider    Answer:   Jenna Luo T [[id number]]    No follow-ups on file.  Rubie Maid, FNP

## 2022-03-13 ENCOUNTER — Telehealth: Payer: Self-pay

## 2022-03-13 ENCOUNTER — Encounter: Payer: Self-pay | Admitting: Family Medicine

## 2022-03-13 NOTE — Telephone Encounter (Signed)
Pt called asking for a referral for a specialist? Per pt it is still hurting.  Do you want to send to Dr Dennard Schaumann for referral?  Pls advice?

## 2022-03-14 NOTE — Telephone Encounter (Signed)
Attempted to call pt on 03/13/22, however, no answer. LVM.

## 2022-03-20 ENCOUNTER — Telehealth: Payer: Self-pay

## 2022-03-20 NOTE — Telephone Encounter (Signed)
Pt called stated this morning, that still has a tight spot on the neck. Would like to see a specialist, if we could referral sent to [place] area.  Pls advice

## 2022-03-21 ENCOUNTER — Ambulatory Visit (INDEPENDENT_AMBULATORY_CARE_PROVIDER_SITE_OTHER): Payer: Medicare Other | Admitting: Family Medicine

## 2022-03-21 ENCOUNTER — Encounter: Payer: Self-pay | Admitting: Family Medicine

## 2022-03-21 VITALS — BP 118/64 | HR 92 | Temp 98.4°F | Ht 74.0 in | Wt 225.0 lb

## 2022-03-21 DIAGNOSIS — S161XXA Strain of muscle, fascia and tendon at neck level, initial encounter: Secondary | ICD-10-CM

## 2022-03-21 MED ORDER — PREDNISONE 20 MG PO TABS: ORAL_TABLET | ORAL | 0 refills | Status: DC

## 2022-03-21 NOTE — Progress Notes (Signed)
Subjective:    Patient ID: Nathan Stewart, male    DOB: 06-10-1955, 67 y.o.   MRN: [id number]  Neck Pain   Patient has been having right-sided neck pain now for approximately 2 weeks.  He states he woke up like this 1 morning after sleeping awkwardly on a couch.  He reports pain and stiffness starting at the mastoid process and radiating down the sternocleidomastoid muscle into the clavicle.  He denies any weakness or numbness in his left arm.  He denies any tingling in his left arm.  He denies any loss of grip strength.  Flexeril has helped very little.   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   GEN OSTEOARTHROSIS INVOLVING MULTIPLE SITES 11/10/2007   GERD 01/01/2007   Hx of adenomatous polyp of colon 04/04/2016   Hypertension    INSOMNIA, CHRONIC 11/13/2009   LIVER FUNCTION TESTS, ABNORMAL 08/27/2007   LOC OSTEOARTHROS NOT SPEC PRIM/SEC LOWER LEG 11/10/2007   Memory loss 08/27/2007   MENISCUS TEAR 10/30/2006   OSTEOARTHRITIS 06/07/2008   Pneumonia    SLEEP APNEA 09/20/2009   TESTICULAR HYPOFUNCTION 08/14/2009   Past Surgical History:  Procedure Laterality Date   COLONOSCOPY     HAND SURGERY  over 20 years ago   'metal plate in wrist'   JOINT REPLACEMENT N/A    [employer] 10/19/2019   MULTIPLE TOOTH EXTRACTIONS     SHOULDER SURGERY Right 2021   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   Procedure: TOTAL KNEE ARTHROPLASTY;  Surgeon: Frederik Pear, MD;  Location: [place];  Service: Orthopedics;  Laterality: Left;   TOTAL KNEE ARTHROPLASTY Right 05/21/2021   Procedure: RIGHT TOTAL KNEE ARTHROPLASTY;  Surgeon: Frederik Pear, MD;  Location: [facility];  Service: Orthopedics;  Laterality: Right;   Current Outpatient Medications on File Prior to Visit  Medication Sig Dispense Refill   ALPRAZolam (XANAX) 0.5 MG tablet TAKE 1 TABLET(0.5 MG) BY MOUTH THREE TIMES DAILY AS NEEDED FOR ANXIETY 30 tablet 2   amLODipine (NORVASC) 5 MG tablet TAKE 1  TABLET(5 MG) BY MOUTH DAILY 90 tablet 3   Bioflavonoid Products (VITAMIN C PLUS) 1000 MG TABS Take 1 tablet by mouth daily. Vitamin C, Zinc, Elderberry     COVID-19 mRNA bivalent vaccine, Pfizer, (PFIZER COVID-19 VAC BIVALENT) injection Inject into the muscle. 0.3 mL 0   COVID-19 mRNA vaccine 2023-2024 (COMIRNATY) syringe Inject into the muscle. 0.3 mL 0   cyclobenzaprine (FLEXERIL) 5 MG tablet Take 1 tablet (5 mg total) by mouth 3 (three) times daily as needed for muscle spasms. 30 tablet 0   Glucosamine 500 MG CAPS Take 2 capsules by mouth daily.      hydrochlorothiazide (HYDRODIURIL) 25 MG tablet TAKE 1 TABLET(25 MG) BY MOUTH DAILY 90 tablet 3   influenza vaccine adjuvanted (FLUAD) 0.5 ML injection Inject into the muscle. 0.5 mL 0   metoprolol succinate (TOPROL-XL) 100 MG 24 hr tablet TAKE 1 TABLET BY MOUTH EVERY DAY WITH OR IMMEDIATELY FOLLOWING A MEAL 90 tablet 1   Multiple Vitamin (MULTIVITAMIN) tablet Take 1 tablet by mouth daily.     Omega-3 Fatty Acids (FISH OIL) 1000 MG CPDR Take 1,000 mg by mouth daily.     sodium chloride (OCEAN) 0.65 % SOLN nasal spray Place 1 spray into both nostrils as needed for congestion.     Turmeric (QC TUMERIC COMPLEX PO) Take 1 capsule by mouth daily.     Current Facility-Administered  Medications on File Prior to Visit  Medication Dose Route Frequency Provider Last Rate Last Admin   0.9 %  sodium chloride infusion  500 mL Intravenous Continuous Gatha Mayer, MD       .all Social History   Socioeconomic History   Marital status: Married    Spouse name: Not on file   Number of children: Not on file   Years of education: Not on file   Highest education level: Not on file  Occupational History   Not on file  Tobacco Use   Smoking status: Former    Types: Cigarettes    Quit date: 02/25/1997    Years since quitting: 25.0   Smokeless tobacco: Never   Tobacco comments:    quit 1999  Vaping Use   Vaping Use: Never used  Substance and Sexual  Activity   Alcohol use: Yes    Alcohol/week: 6.0 standard drinks of alcohol    Types: 6 Cans of beer per week    Comment: occasional   Drug use: No   Sexual activity: Yes  Other Topics Concern   Not on file  Social History Narrative   Not on file   Social Determinants of Health   Financial Resource Strain: Not on file  Food Insecurity: Not on file  Transportation Needs: Not on file  Physical Activity: Not on file  Stress: Not on file  Social Connections: Not on file  Intimate Partner Violence: Not on file   Family History  Problem Relation Age of Onset   Arthritis Mother    Heart disease Mother    Hypertension Mother    Hypertension Father    Stroke Father    Hypertension Sister    Stroke Brother    Colon cancer Paternal Uncle       Review of Systems  Musculoskeletal:  Positive for neck pain.  All other systems reviewed and are negative.      Objective:   Physical Exam Vitals reviewed.  Constitutional:      General: He is not in acute distress.    Appearance: Normal appearance. He is normal weight. He is not ill-appearing or toxic-appearing.  HENT:     Head: Normocephalic and atraumatic.  Cardiovascular:     Rate and Rhythm: Normal rate and regular rhythm.     Pulses: Normal pulses.     Heart sounds: Normal heart sounds. No murmur heard.    No friction rub. No gallop.  Pulmonary:     Effort: Pulmonary effort is normal. No respiratory distress.     Breath sounds: Normal breath sounds. No wheezing, rhonchi or rales.  Musculoskeletal:     Cervical back: Rigidity present. Pain with movement and muscular tenderness present. Decreased range of motion.  Neurological:     General: No focal deficit present.     Mental Status: He is alert and oriented to person, place, and time. Mental status is at baseline.     Cranial Nerves: No cranial nerve deficit.     Motor: No weakness.     Gait: Gait normal.           Assessment & Plan:  Strain of neck muscle,  initial encounter - Plan: Ambulatory referral to Physical Therapy I believe the patient is dealing with muscle spasms.  Begin prednisone taper pack as an anti-inflammatory.  Continue Flexeril every 8 hours as needed and consult physical therapy.

## 2022-04-04 DIAGNOSIS — M1711 Unilateral primary osteoarthritis, right knee: Secondary | ICD-10-CM | POA: Diagnosis not present

## 2022-04-04 DIAGNOSIS — M170 Bilateral primary osteoarthritis of knee: Secondary | ICD-10-CM | POA: Diagnosis not present

## 2022-04-06 ENCOUNTER — Other Ambulatory Visit: Payer: Self-pay | Admitting: Family Medicine

## 2022-04-08 NOTE — Telephone Encounter (Signed)
Requested Prescriptions  Pending Prescriptions Disp Refills   hydrochlorothiazide (HYDRODIURIL) 25 MG tablet [Pharmacy Med Name: HYDROCHLOROTHIAZIDE 25MG TABLETS] 90 tablet 1    Sig: TAKE 1 TABLET(25 MG) BY MOUTH DAILY     Cardiovascular: Diuretics - Thiazide Failed - 04/06/2022  3:32 AM      Failed - Valid encounter within last 6 months    Recent Outpatient Visits           1 year ago Screening for AAA (abdominal aortic aneurysm)   [place] Susy Frizzle, MD   1 year ago Essential hypertension   [place], Modena Nunnery, MD   2 years ago Routine general medical examination at a health care facility   [place], Modena Nunnery, MD   3 years ago Routine general medical examination at a health care facility   [place], Modena Nunnery, MD   4 years ago Essential hypertension   [place], Modena Nunnery, MD       Future Appointments             In 7 months Pickard, Cammie Mcgee, MD [facility]            Passed - Cr in normal range and within 180 days    Creat  Date Value Ref Range Status  11/06/2021 0.84 0.70 - 1.35 mg/dL Final         Passed - K in normal range and within 180 days    Potassium  Date Value Ref Range Status  11/06/2021 4.2 3.5 - 5.3 mmol/L Final         Passed - Na in normal range and within 180 days    Sodium  Date Value Ref Range Status  11/06/2021 138 135 - 146 mmol/L Final         Passed - Last BP in normal range    BP Readings from Last 1 Encounters:  03/21/22 118/64

## 2022-04-09 ENCOUNTER — Other Ambulatory Visit: Payer: Self-pay | Admitting: Family Medicine

## 2022-04-10 NOTE — Telephone Encounter (Signed)
Requested Prescriptions  Pending Prescriptions Disp Refills   metoprolol succinate (TOPROL-XL) 100 MG 24 hr tablet [Pharmacy Med Name: METOPROLOL ER SUCCINATE 100MG TABS] 90 tablet 1    Sig: TAKE 1 TABLET BY MOUTH EVERY DAY WITH OR IMMEDIATELY FOLLOWING A MEAL     Cardiovascular:  Beta Blockers Failed - 04/09/2022  8:04 AM      Failed - Valid encounter within last 6 months    Recent Outpatient Visits           1 year ago Screening for AAA (abdominal aortic aneurysm)   [place] Susy Frizzle, MD   1 year ago Essential hypertension   [place], Modena Nunnery, MD   2 years ago Routine general medical examination at a health care facility   [place], Modena Nunnery, MD   3 years ago Routine general medical examination at a health care facility   [place], Modena Nunnery, MD   4 years ago Essential hypertension   [place], Modena Nunnery, MD       Future Appointments             In 7 months Pickard, Cammie Mcgee, MD [facility]            Passed - Last BP in normal range    BP Readings from Last 1 Encounters:  03/21/22 118/64         Passed - Last Heart Rate in normal range    Pulse Readings from Last 1 Encounters:  03/21/22 92

## 2022-05-07 ENCOUNTER — Ambulatory Visit (HOSPITAL_BASED_OUTPATIENT_CLINIC_OR_DEPARTMENT_OTHER): Payer: Medicare Other | Admitting: Physical Therapy

## 2022-06-05 ENCOUNTER — Other Ambulatory Visit: Payer: Self-pay | Admitting: Family Medicine

## 2022-06-06 NOTE — Telephone Encounter (Signed)
Requested medications are due for refill today.  Provider to determine  Requested medications are on the active medications list.  yes  Last refill. 03/01/2022 #30 2 rf  Future visit scheduled.   yes  Notes to clinic.  Refill not delegated.    Requested Prescriptions  Pending Prescriptions Disp Refills   ALPRAZolam (XANAX) 0.5 MG tablet [Pharmacy Med Name: ALPRAZOLAM 0.5MG  TABLETS] 30 tablet     Sig: TAKE 1 TABLET(0.5 MG) BY MOUTH THREE TIMES DAILY AS NEEDED FOR ANXIETY     Not Delegated - Psychiatry: Anxiolytics/Hypnotics 2 Failed - 06/05/2022  2:47 PM      Failed - This refill cannot be delegated      Failed - Urine Drug Screen completed in last 360 days      Failed - Valid encounter within last 6 months    Recent Outpatient Visits           1 year ago Screening for AAA (abdominal aortic aneurysm)   [facility] Donita Brooks, MD   2 years ago Essential hypertension   [facility] [place], Velna Hatchet, MD   2 years ago Routine general medical examination at a health care facility   [facility] [place], Velna Hatchet, MD   3 years ago Routine general medical examination at a health care facility   [facility] [place], Velna Hatchet, MD   4 years ago Essential hypertension   [facility] [place], Velna Hatchet, MD       Future Appointments             In 5 months Pickard, Priscille Heidelberg, MD [facility], [facility]            Passed - Patient is not pregnant

## 2022-07-09 ENCOUNTER — Encounter: Payer: Self-pay | Admitting: Family Medicine

## 2022-07-09 ENCOUNTER — Ambulatory Visit (INDEPENDENT_AMBULATORY_CARE_PROVIDER_SITE_OTHER): Payer: Medicare Other | Admitting: Family Medicine

## 2022-07-09 VITALS — BP 138/70 | HR 67 | Temp 97.5°F | Ht 72.0 in | Wt 227.4 lb

## 2022-07-09 DIAGNOSIS — Z1329 Encounter for screening for other suspected endocrine disorder: Secondary | ICD-10-CM | POA: Diagnosis not present

## 2022-07-09 DIAGNOSIS — G629 Polyneuropathy, unspecified: Secondary | ICD-10-CM | POA: Diagnosis not present

## 2022-07-09 DIAGNOSIS — E161 Other hypoglycemia: Secondary | ICD-10-CM | POA: Diagnosis not present

## 2022-07-09 NOTE — Progress Notes (Signed)
Subjective:    Patient ID: Nathan Stewart, male    DOB: 1956/01/16, 67 y.o.   MRN: [id number]   Patient states that for the last month or so, his finger tips are going numb.  This includes the 5th digit as well.  It will occur at random times.  There are no specific triggers.  He denies any weakness in his grip strength.  He is not dropping any objects.  Today on examination he has good grip strength in both hands.  There is no obvious deficit in C6-C7 or C8 bilaterally.  He has normal reflexes at the brachioradialis, and the biceps.  He has no pain or tenderness with Tinel's sign.  I am unable to elicit any numbness with Phalen sign.  He denies any other numbness anywhere else in his body.  He has normal pulses at the radial artery and ulnar artery bilaterally and normal capillary refill.  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   GEN OSTEOARTHROSIS INVOLVING MULTIPLE SITES 11/10/2007   GERD 01/01/2007   Hx of adenomatous polyp of colon 04/04/2016   Hypertension    INSOMNIA, CHRONIC 11/13/2009   LIVER FUNCTION TESTS, ABNORMAL 08/27/2007   LOC OSTEOARTHROS NOT SPEC PRIM/SEC LOWER LEG 11/10/2007   Memory loss 08/27/2007   MENISCUS TEAR 10/30/2006   OSTEOARTHRITIS 06/07/2008   Pneumonia    SLEEP APNEA 09/20/2009   TESTICULAR HYPOFUNCTION 08/14/2009   Past Surgical History:  Procedure Laterality Date   COLONOSCOPY     HAND SURGERY  over 20 years ago   'metal plate in wrist'   JOINT REPLACEMENT N/A    [employer] 10/19/2019   MULTIPLE TOOTH EXTRACTIONS     SHOULDER SURGERY Right 2021   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   Procedure: TOTAL KNEE ARTHROPLASTY;  Surgeon: Gean Birchwood, MD;  [facility];  Service: Orthopedics;  Laterality: Left;   TOTAL KNEE ARTHROPLASTY Right 05/21/2021   Procedure: RIGHT TOTAL KNEE ARTHROPLASTY;   Surgeon: Gean Birchwood, MD;  Location: [facility];  Service: Orthopedics;  Laterality: Right;   Current Outpatient Medications on File Prior to Visit  Medication Sig Dispense Refill   ALPRAZolam (XANAX) 0.5 MG tablet TAKE 1 TABLET(0.5 MG) BY MOUTH THREE TIMES DAILY AS NEEDED FOR ANXIETY 30 tablet 2   amLODipine (NORVASC) 5 MG tablet TAKE 1 TABLET(5 MG) BY MOUTH DAILY 90 tablet 3   Bioflavonoid Products (VITAMIN C PLUS) 1000 MG TABS Take 1 tablet by mouth daily. Vitamin C, Zinc, Elderberry     COVID-19 mRNA bivalent vaccine, Pfizer, (PFIZER COVID-19 VAC BIVALENT) injection Inject into the muscle. 0.3 mL 0   COVID-19 mRNA vaccine 2023-2024 (COMIRNATY) syringe Inject into the muscle. 0.3 mL 0   cyclobenzaprine (FLEXERIL) 5 MG tablet Take 1 tablet (5 mg total) by mouth 3 (three) times daily as needed for muscle spasms. 30 tablet 0   Glucosamine 500 MG CAPS Take 2 capsules by mouth daily.      hydrochlorothiazide (HYDRODIURIL) 25 MG tablet TAKE 1 TABLET(25 MG) BY MOUTH DAILY 90 tablet 1   influenza vaccine adjuvanted (FLUAD) 0.5 ML injection Inject into the muscle. 0.5 mL 0   metoprolol succinate (TOPROL-XL) 100 MG 24 hr tablet TAKE 1 TABLET BY MOUTH EVERY DAY WITH OR IMMEDIATELY FOLLOWING A MEAL 90 tablet 1   Multiple Vitamin (MULTIVITAMIN)  tablet Take 1 tablet by mouth daily.     Omega-3 Fatty Acids (FISH OIL) 1000 MG CPDR Take 1,000 mg by mouth daily.     predniSONE (DELTASONE) 20 MG tablet 3 tabs poqday 1-2, 2 tabs poqday 3-4, 1 tab poqday 5-6 12 tablet 0   sodium chloride (OCEAN) 0.65 % SOLN nasal spray Place 1 spray into both nostrils as needed for congestion.     Turmeric (QC TUMERIC COMPLEX PO) Take 1 capsule by mouth daily.     Current Facility-Administered Medications on File Prior to Visit  Medication Dose Route Frequency Provider Last Rate Last Admin   0.9 %  sodium chloride infusion  500 mL Intravenous Continuous Iva Boop, MD       .all Social History   Socioeconomic History    Marital status: Married    Spouse name: Not on file   Number of children: Not on file   Years of education: Not on file   Highest education level: Not on file  Occupational History   Not on file  Tobacco Use   Smoking status: Former    Types: Cigarettes    Quit date: 02/25/1997    Years since quitting: 25.3   Smokeless tobacco: Never   Tobacco comments:    quit 1999  Vaping Use   Vaping Use: Never used  Substance and Sexual Activity   Alcohol use: Yes    Alcohol/week: 6.0 standard drinks of alcohol    Types: 6 Cans of beer per week    Comment: occasional   Drug use: No   Sexual activity: Yes  Other Topics Concern   Not on file  Social History Narrative   Not on file   Social Determinants of Health   Financial Resource Strain: Not on file  Food Insecurity: Not on file  Transportation Needs: Not on file  Physical Activity: Not on file  Stress: Not on file  Social Connections: Not on file  Intimate Partner Violence: Not on file   Family History  Problem Relation Age of Onset   Arthritis Mother    Heart disease Mother    Hypertension Mother    Hypertension Father    Stroke Father    Hypertension Sister    Stroke Brother    Colon cancer Paternal Uncle       Review of Systems  Musculoskeletal:  Positive for neck pain.  All other systems reviewed and are negative.      Objective:   Physical Exam Vitals reviewed.  Constitutional:      General: He is not in acute distress.    Appearance: Normal appearance. He is normal weight. He is not ill-appearing or toxic-appearing.  HENT:     Head: Normocephalic and atraumatic.  Cardiovascular:     Rate and Rhythm: Normal rate and regular rhythm.     Pulses: Normal pulses.     Heart sounds: Normal heart sounds. No murmur heard.    No friction rub. No gallop.  Pulmonary:     Effort: Pulmonary effort is normal. No respiratory distress.     Breath sounds: Normal breath sounds. No wheezing, rhonchi or rales.   Musculoskeletal:     Right hand: No swelling, tenderness or bony tenderness. Normal range of motion. Normal strength. There is disruption of two-point discrimination. Normal capillary refill. Normal pulse.     Left hand: No swelling, tenderness or bony tenderness. Normal range of motion. Normal strength. There is disruption of two-point discrimination. Normal capillary refill. Normal  pulse.     Cervical back: Decreased range of motion.  Neurological:     General: No focal deficit present.     Mental Status: He is alert and oriented to person, place, and time. Mental status is at baseline.     Cranial Nerves: No cranial nerve deficit.     Motor: No weakness.     Gait: Gait normal.           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  However it does not seem to be isolated to the median or ulnar nerve.  Therefore I recommended nerve conduction studies to evaluate further.  Also check for metabolic causes of neuropathy checking B12, TSH, A1c, CMP, and CBC

## 2022-07-12 LAB — COMPLETE METABOLIC PANEL WITH GFR
AG Ratio: 1.6 (calc) (ref 1.0–2.5)
ALT: 98 U/L — ABNORMAL HIGH (ref 9–46)
AST: 57 U/L — ABNORMAL HIGH (ref 10–35)
Albumin: 4.6 g/dL (ref 3.6–5.1)
Alkaline phosphatase (APISO): 72 U/L (ref 35–144)
BUN: 14 mg/dL (ref 7–25)
CO2: 20 mmol/L (ref 20–32)
Calcium: 10 mg/dL (ref 8.6–10.3)
Chloride: 102 mmol/L (ref 98–110)
Creat: 0.98 mg/dL (ref 0.70–1.35)
Globulin: 2.8 g/dL (calc) (ref 1.9–3.7)
Glucose, Bld: 139 mg/dL — ABNORMAL HIGH (ref 65–99)
Potassium: 3.9 mmol/L (ref 3.5–5.3)
Sodium: 136 mmol/L (ref 135–146)
Total Bilirubin: 0.7 mg/dL (ref 0.2–1.2)
Total Protein: 7.4 g/dL (ref 6.1–8.1)
eGFR: 85 mL/min/{1.73_m2} (ref >=60)

## 2022-07-12 LAB — CBC WITH DIFFERENTIAL/PLATELET
Absolute Monocytes: 711 cells/uL (ref 200–950)
Basophils Absolute: 63 cells/uL (ref 0–200)
Basophils Relative: 0.8 %
Eosinophils Absolute: 269 cells/uL (ref 15–500)
Eosinophils Relative: 3.4 %
HCT: 41.6 % (ref 38.5–50.0)
Hemoglobin: 14.1 g/dL (ref 13.2–17.1)
Lymphs Abs: 1785 cells/uL (ref 850–3900)
MCH: 30.5 pg (ref 27.0–33.0)
MCHC: 33.9 g/dL (ref 32.0–36.0)
MCV: 89.8 fL (ref 80.0–100.0)
MPV: 11.4 fL (ref 7.5–12.5)
Monocytes Relative: 9 %
Neutro Abs: 5072 cells/uL (ref 1500–7800)
Neutrophils Relative %: 64.2 %
Platelets: 222 10*3/uL (ref 140–400)
RBC: 4.63 10*6/uL (ref 4.20–5.80)
RDW: 12.1 % (ref 11.0–15.0)
Total Lymphocyte: 22.6 %
WBC: 7.9 10*3/uL (ref 3.8–10.8)

## 2022-07-12 LAB — HEMOGLOBIN A1C
Hgb A1c MFr Bld: 5.6 % of total Hgb (ref <5.7)
Mean Plasma Glucose: 114 mg/dL
eAG (mmol/L): 6.3 mmol/L

## 2022-07-12 LAB — TSH: TSH: 1.58 mIU/L (ref 0.40–4.50)

## 2022-07-12 LAB — HEPATITIS PANEL, ACUTE
Hep A IgM: NONREACTIVE
Hep B C IgM: NONREACTIVE
Hepatitis B Surface Ag: NONREACTIVE
Hepatitis C Ab: NONREACTIVE

## 2022-07-12 LAB — TEST AUTHORIZATION

## 2022-07-12 LAB — VITAMIN B12: Vitamin B-12: 1094 pg/mL (ref 200–1100)

## 2022-07-13 ENCOUNTER — Other Ambulatory Visit: Payer: Self-pay | Admitting: Family Medicine

## 2022-07-15 ENCOUNTER — Other Ambulatory Visit: Payer: Self-pay

## 2022-07-15 DIAGNOSIS — R7989 Other specified abnormal findings of blood chemistry: Secondary | ICD-10-CM

## 2022-07-15 NOTE — Telephone Encounter (Signed)
OV 07/09/22 Requested Prescriptions  Pending Prescriptions Disp Refills   metoprolol succinate (TOPROL-XL) 100 MG 24 hr tablet [Pharmacy Med Name: METOPROLOL ER SUCCINATE 100MG  TABS] 90 tablet 1    Sig: TAKE 1 TABLET BY MOUTH EVERY DAY WITH OR IMMEDIATELY FOLLOWING A MEAL     Cardiovascular:  Beta Blockers Failed - 07/13/2022 11:06 AM      Failed - Valid encounter within last 6 months    Recent Outpatient Visits           1 year ago Screening for AAA (abdominal aortic aneurysm)   [facility] Donita Brooks, MD   2 years ago Essential hypertension   [facility] [place], Velna Hatchet, MD   2 years ago Routine general medical examination at a health care facility   [facility] [place], Velna Hatchet, MD   3 years ago Routine general medical examination at a health care facility   [facility] [place], Velna Hatchet, MD   4 years ago Essential hypertension   [facility] [place], Velna Hatchet, MD       Future Appointments             In 3 months Pickard, Priscille Heidelberg, MD [facility]            Passed - Last BP in normal range    BP Readings from Last 1 Encounters:  07/09/22 138/70         Passed - Last Heart Rate in normal range    Pulse Readings from Last 1 Encounters:  07/09/22 67

## 2022-07-16 ENCOUNTER — Encounter (INDEPENDENT_AMBULATORY_CARE_PROVIDER_SITE_OTHER): Payer: Self-pay | Admitting: *Deleted

## 2022-08-08 ENCOUNTER — Other Ambulatory Visit: Payer: Self-pay | Admitting: Family Medicine

## 2022-08-08 DIAGNOSIS — G629 Polyneuropathy, unspecified: Secondary | ICD-10-CM

## 2022-08-12 DIAGNOSIS — H40033 Anatomical narrow angle, bilateral: Secondary | ICD-10-CM | POA: Diagnosis not present

## 2022-08-12 DIAGNOSIS — H40023 Open angle with borderline findings, high risk, bilateral: Secondary | ICD-10-CM | POA: Diagnosis not present

## 2022-08-13 ENCOUNTER — Telehealth: Payer: Self-pay

## 2022-08-13 NOTE — Telephone Encounter (Signed)
Referral message:  Please advise.   ----- Message ----- From: Carlus Pavlov Sent: 08/13/2022   2:13 PM EDT To: Trula Slade Subject: Patient Referral                                Hi Linda!    This is Mattie with [facility]. Thank you for the referral for this patient! I wanted to reach out since this patient is being referred to [place] specifically requesting a nerve conduction study. We will have to get patient scheduled for a consult first and then can order the study to be done. We are currently booking out about mid September and the study is usually scheduled around 4(ish) weeks after that. Just wanted to make sure that was what Dr. Tanya Nones was wanting.    Let me know, thank you!

## 2022-08-21 DIAGNOSIS — R200 Anesthesia of skin: Secondary | ICD-10-CM | POA: Diagnosis not present

## 2022-08-21 DIAGNOSIS — R292 Abnormal reflex: Secondary | ICD-10-CM | POA: Diagnosis not present

## 2022-08-28 ENCOUNTER — Encounter: Payer: Self-pay | Admitting: Family Medicine

## 2022-08-28 ENCOUNTER — Ambulatory Visit (INDEPENDENT_AMBULATORY_CARE_PROVIDER_SITE_OTHER): Payer: Medicare Other | Admitting: Family Medicine

## 2022-08-28 VITALS — BP 140/80 | HR 77 | Temp 97.7°F | Ht 73.0 in | Wt 222.0 lb

## 2022-08-28 DIAGNOSIS — J069 Acute upper respiratory infection, unspecified: Secondary | ICD-10-CM

## 2022-08-28 NOTE — Progress Notes (Signed)
Subjective:  HPI: Nathan Stewart is a 67 y.o. male presenting on 08/28/2022 for Follow-up (Pos sinus infection)   HPI Patient is in today for malaise and sinus congestion for 2 days on last Friday and Saturday. He reports his symptoms are overall improved. He was urged to come today by his wife but reports he feels great. His wife was sick last week as well. He took Coriciden twice for symptoms. Denies fever, cough, sore throat, congestion, rhinorrhea.   Review of Systems  All other systems reviewed and are negative.   Relevant past medical history reviewed and updated as indicated.   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   GEN OSTEOARTHROSIS INVOLVING MULTIPLE SITES 11/10/2007   GERD 01/01/2007   Hx of adenomatous polyp of colon 04/04/2016   Hypertension    INSOMNIA, CHRONIC 11/13/2009   LIVER FUNCTION TESTS, ABNORMAL 08/27/2007   LOC OSTEOARTHROS NOT SPEC PRIM/SEC LOWER LEG 11/10/2007   Memory loss 08/27/2007   MENISCUS TEAR 10/30/2006   OSTEOARTHRITIS 06/07/2008   Pneumonia    SLEEP APNEA 09/20/2009   TESTICULAR HYPOFUNCTION 08/14/2009     Past Surgical History:  Procedure Laterality Date   COLONOSCOPY     HAND SURGERY  over 20 years ago   'metal plate in wrist'   JOINT REPLACEMENT N/A    Phreesia 10/19/2019   MULTIPLE TOOTH EXTRACTIONS     SHOULDER SURGERY Right 2021   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   Procedure: TOTAL KNEE ARTHROPLASTY;  Surgeon: Gean Birchwood, MD;  [facility]: [facility];  Service: Orthopedics;  Laterality: Left;   TOTAL KNEE ARTHROPLASTY Right 05/21/2021   Procedure: RIGHT TOTAL KNEE ARTHROPLASTY;  Surgeon: Gean Birchwood, MD;  Location: [facility];  Service: Orthopedics;  Laterality: Right;    Allergies and medications reviewed and updated.   Current Outpatient Medications:    ALPRAZolam (XANAX) 0.5 MG tablet, TAKE 1 TABLET(0.5 MG) BY MOUTH THREE TIMES DAILY AS NEEDED FOR ANXIETY, Disp: 30  tablet, Rfl: 2   amLODipine (NORVASC) 5 MG tablet, TAKE 1 TABLET(5 MG) BY MOUTH DAILY, Disp: 90 tablet, Rfl: 3   Bioflavonoid Products (VITAMIN C PLUS) 1000 MG TABS, Take 1 tablet by mouth daily. Vitamin C, Zinc, Elderberry, Disp: , Rfl:    COVID-19 mRNA bivalent vaccine, Pfizer, (PFIZER COVID-19 VAC BIVALENT) injection, Inject into the muscle., Disp: 0.3 mL, Rfl: 0   COVID-19 mRNA vaccine 2023-2024 (COMIRNATY) syringe, Inject into the muscle., Disp: 0.3 mL, Rfl: 0   cyclobenzaprine (FLEXERIL) 5 MG tablet, Take 1 tablet (5 mg total) by mouth 3 (three) times daily as needed for muscle spasms., Disp: 30 tablet, Rfl: 0   Glucosamine 500 MG CAPS, Take 2 capsules by mouth daily. , Disp: , Rfl:    hydrochlorothiazide (HYDRODIURIL) 25 MG tablet, TAKE 1 TABLET(25 MG) BY MOUTH DAILY, Disp: 90 tablet, Rfl: 1   influenza vaccine adjuvanted (FLUAD) 0.5 ML injection, Inject into the muscle., Disp: 0.5 mL, Rfl: 0   metoprolol succinate (TOPROL-XL) 100 MG 24 hr tablet, TAKE 1 TABLET BY MOUTH EVERY DAY WITH OR IMMEDIATELY FOLLOWING A MEAL, Disp: 90 tablet, Rfl: 1   Multiple Vitamin (MULTIVITAMIN) tablet, Take 1 tablet by mouth daily., Disp: , Rfl:    Omega-3 Fatty Acids (FISH OIL) 1000 MG CPDR, Take 1,000 mg by mouth daily., Disp: , Rfl:    sodium chloride (OCEAN) 0.65 % SOLN nasal spray, Place 1 spray into both nostrils as needed  for congestion., Disp: , Rfl:    Turmeric (QC TUMERIC COMPLEX PO), Take 1 capsule by mouth daily., Disp: , Rfl:    predniSONE (DELTASONE) 20 MG tablet, 3 tabs poqday 1-2, 2 tabs poqday 3-4, 1 tab poqday 5-6 (Patient not taking: Reported on 08/28/2022), Disp: 12 tablet, Rfl: 0  Current Facility-Administered Medications:    0.9 %  sodium chloride infusion, 500 mL, Intravenous, Continuous, Leone Payor, Maryjean Morn, MD  Allergies  Allergen Reactions   Irbesartan     Tingling, swelling  Pt unaware of allergy reaction    Percocet [Oxycodone-Acetaminophen] Nausea And Vomiting   Shingrix [Zoster  Vac Recomb Adjuvanted] Itching    Objective:   BP (!) 140/80   Pulse 77   Temp 97.7 F (36.5 C) (Oral)   Ht 6\' 1"  (1.854 m)   Wt 222 lb (100.7 kg)   SpO2 97%   BMI 29.29 kg/m      08/28/2022    9:02 AM 07/09/2022   10:32 AM 03/21/2022    3:31 PM  Vitals with BMI  Height 6\' 1"  6\' 0"  6\' 2"   Weight 222 lbs 227 lbs 6 oz 225 lbs  BMI 29.3 30.83 28.88  Systolic 140 138 161  Diastolic 80 70 64  Pulse 77 67 92     Physical Exam Vitals and nursing note reviewed.  Constitutional:      Appearance: Normal appearance. He is normal weight.  HENT:     Head: Normocephalic and atraumatic.  Cardiovascular:     Rate and Rhythm: Normal rate and regular rhythm.     Pulses: Normal pulses.     Heart sounds: Normal heart sounds.  Pulmonary:     Effort: Pulmonary effort is normal.     Breath sounds: Normal breath sounds.  Skin:    General: Skin is warm and dry.     Capillary Refill: Capillary refill takes less than 2 seconds.  Neurological:     General: No focal deficit present.     Mental Status: He is alert and oriented to person, place, and time. Mental status is at baseline.  Psychiatric:        Mood and Affect: Mood normal.        Behavior: Behavior normal.        Thought Content: Thought content normal.        Judgment: Judgment normal.     Assessment & Plan:  Viral URI Assessment & Plan: Symptoms consistent with past viral URI and are overall improved. Discussed expected course and features suggestive of secondary bacterial infection.  Continue supportive care if needed. Increase fluid intake with water or electrolyte solution like pedialyte. Encouraged acetaminophen as needed for fever/pain. Encouraged salt water gargling, chloraseptic spray and throat lozenges. Encouraged OTC guaifenesin. Encouraged saline sinus flushes and/or neti with humidified air.        Follow up plan: Return if symptoms worsen or fail to improve.  Park Meo, FNP

## 2022-08-28 NOTE — Assessment & Plan Note (Signed)
Symptoms consistent with past viral URI and are overall improved. Discussed expected course and features suggestive of secondary bacterial infection.  Continue supportive care if needed. Increase fluid intake with water or electrolyte solution like pedialyte. Encouraged acetaminophen as needed for fever/pain. Encouraged salt water gargling, chloraseptic spray and throat lozenges. Encouraged OTC guaifenesin. Encouraged saline sinus flushes and/or neti with humidified air.

## 2022-08-28 NOTE — Patient Instructions (Addendum)

## 2022-09-04 ENCOUNTER — Other Ambulatory Visit: Payer: Self-pay | Admitting: Family Medicine

## 2022-09-05 DIAGNOSIS — M4802 Spinal stenosis, cervical region: Secondary | ICD-10-CM | POA: Diagnosis not present

## 2022-09-05 NOTE — Telephone Encounter (Signed)
Requested medications are due for refill today.  yes  Requested medications are on the active medications list.  yes  Last refill. 06/06/2022 #30 2 rf  Future visit scheduled.   yes  Notes to clinic.  Refill not delegated.    Requested Prescriptions  Pending Prescriptions Disp Refills   ALPRAZolam (XANAX) 0.5 MG tablet [Pharmacy Med Name: ALPRAZOLAM 0.5MG  TABLETS] 30 tablet     Sig: TAKE 1 TABLET(0.5 MG) BY MOUTH THREE TIMES DAILY AS NEEDED FOR ANXIETY     Not Delegated - Psychiatry: Anxiolytics/Hypnotics 2 Failed - 09/04/2022  4:49 PM      Failed - This refill cannot be delegated      Failed - Urine Drug Screen completed in last 360 days      Failed - Valid encounter within last 6 months    Recent Outpatient Visits           1 year ago Screening for AAA (abdominal aortic aneurysm)   [facility] Donita Brooks, MD   2 years ago Essential hypertension   [facility] [place], Velna Hatchet, MD   2 years ago Routine general medical examination at a health care facility   [facility] [place], Velna Hatchet, MD   3 years ago Routine general medical examination at a health care facility   [facility] [place], Velna Hatchet, MD   4 years ago Essential hypertension   [facility] [place], Velna Hatchet, MD       Future Appointments             In 2 months Pickard, Priscille Heidelberg, MD [facility], [facility]            Passed - Patient is not pregnant

## 2022-09-11 DIAGNOSIS — M47812 Spondylosis without myelopathy or radiculopathy, cervical region: Secondary | ICD-10-CM | POA: Diagnosis not present

## 2022-09-11 DIAGNOSIS — G9520 Unspecified cord compression: Secondary | ICD-10-CM | POA: Insufficient documentation

## 2022-09-11 DIAGNOSIS — M5030 Other cervical disc degeneration, unspecified cervical region: Secondary | ICD-10-CM | POA: Insufficient documentation

## 2022-09-12 ENCOUNTER — Ambulatory Visit (INDEPENDENT_AMBULATORY_CARE_PROVIDER_SITE_OTHER): Payer: Medicare Other | Admitting: Gastroenterology

## 2022-09-12 ENCOUNTER — Encounter (INDEPENDENT_AMBULATORY_CARE_PROVIDER_SITE_OTHER): Payer: Self-pay | Admitting: Gastroenterology

## 2022-09-12 VITALS — BP 130/82 | HR 77 | Temp 97.5°F | Ht 73.0 in | Wt 224.9 lb

## 2022-09-12 DIAGNOSIS — R7989 Other specified abnormal findings of blood chemistry: Secondary | ICD-10-CM | POA: Diagnosis not present

## 2022-09-12 DIAGNOSIS — K760 Fatty (change of) liver, not elsewhere classified: Secondary | ICD-10-CM | POA: Diagnosis not present

## 2022-09-12 NOTE — Patient Instructions (Signed)
We will check a special ultrasound of your liver and some further blood testing Please be mindful of your alcohol intake as this may be contributing to your elevated liver numbers and frequent use of alcohol over time can significantly damage your liver I am providing the mediterranean diet, this is a liver healthy diet that can also help with good weight management It is important to make sure you are getting atleast 30 minutes of exercise 4-5x/week You should aim for 5-7% decrease in overall weight.  I will confirm timing of your next colonoscopy due date as well  Follow up 3 months  It was a pleasure to see you today. I want to create trusting relationships with patients and provide genuine, compassionate, and quality care. I truly value your feedback! please be on the lookout for a survey regarding your visit with me today. I appreciate your input about our visit and your time in completing this!    Nathan Colquitt L. Jeanmarie Hubert, MSN, APRN, AGNP-C Adult-Gerontology Nurse Practitioner [facility] at [facility]

## 2022-09-12 NOTE — Progress Notes (Addendum)
Referring Provider: Donita Brooks, MD Primary Care Physician:  Donita Brooks, MD Primary GI Physician: new (Dr. Levon Hedger)  Chief Complaint  Patient presents with   Elevated Hepatic Enzymes    Referred for elevated liver enzymes. Pt states it has been up and down for over 20 years.    HPI:   TINY RIETZ is a 67 y.o. male with past medical history of GERD, adenomatous polyps of colon, HTN, OA, Sleep apnea, ED.   Patient presenting today as a new patient for elevated LFTs.  [place] RUQ 10/2021 with Increased hepatic parenchymal echogenicity suggestive of steatosis.Cholelithiasis without secondary signs of acute cholecystitis.   Most recent labs in may with AST 57, ALT 98, T bili 0.7, AP 72, acute hep panel negative, platelet count 222k, albumin 4.6 Appears onset of LFT elevation in 2007, elevated through 2011, then per chart review, elevation began again in 2023.   Present: Patient states that his LFTs have been up and down for years on his labs. He states maybe 20+ years but notes his previous PCP never seemed concerned, however, his PCP now sent him here for further evaluation. He notes that he does drinks beer a few days a week/sometimes on the weekend, history of frequency is not quite clear based on patient's history.  does not drink liquor. He notes drinking 4-5 12 oz beers at each sitting. Denies any new medications, supplements, energy drinks or teas. He does take glucosamine and turmeric. Denies swelling to abdomen or legs, no episodes of confusion, jaundice, pruritus.   He was previously taking frequent Ibuprofen for knee pain last year prior to knee replacement.denies any high dosing of tylenol.   Patient states he received a letter saying that guidelines had changed and he was not due for colonoscopy again until 2025. Denies rectal bleeding, melena or changes in bowel habits.   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   GEN OSTEOARTHROSIS INVOLVING MULTIPLE SITES 11/10/2007   GERD 01/01/2007   Hx of adenomatous polyp of colon 04/04/2016   Hypertension    INSOMNIA, CHRONIC 11/13/2009   LIVER FUNCTION TESTS, ABNORMAL 08/27/2007   LOC OSTEOARTHROS NOT SPEC PRIM/SEC LOWER LEG 11/10/2007   Memory loss 08/27/2007   MENISCUS TEAR 10/30/2006   OSTEOARTHRITIS 06/07/2008   Pneumonia    SLEEP APNEA 09/20/2009   TESTICULAR HYPOFUNCTION 08/14/2009    Past Surgical History:  Procedure Laterality Date   COLONOSCOPY     HAND SURGERY  over 20 years ago   'metal plate in wrist'   JOINT REPLACEMENT N/A    [employer]reesia 10/19/2019   MULTIPLE TOOTH EXTRACTIONS     SHOULDER SURGERY Right 2021   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   Procedure: TOTAL KNEE ARTHROPLASTY;  Surgeon: Gean Birchwood, MD;  Location: [facility];  Service: Orthopedics;  Laterality: Left;   TOTAL KNEE ARTHROPLASTY Right 05/21/2021   Procedure: RIGHT TOTAL KNEE ARTHROPLASTY;  Surgeon: Gean Birchwood, MD;  Location: [facility];  Service: Orthopedics;  Laterality: Right;    Current Outpatient Medications  Medication Sig Dispense Refill   ALPRAZolam (XANAX) 0.5 MG tablet TAKE  1 TABLET(0.5 MG) BY MOUTH THREE TIMES DAILY AS NEEDED FOR ANXIETY 30 tablet 2   amLODipine (NORVASC) 5 MG tablet TAKE 1 TABLET(5 MG) BY MOUTH DAILY 90 tablet 3   Bioflavonoid Products (VITAMIN C PLUS) 1000 MG TABS Take 1 tablet by mouth daily. Vitamin C, Zinc, Elderberry     Glucosamine 500 MG CAPS Take 2 capsules by mouth daily.      hydrochlorothiazide (HYDRODIURIL) 25 MG tablet TAKE 1 TABLET(25 MG) BY MOUTH DAILY 90 tablet 1   metoprolol succinate (TOPROL-XL) 100 MG  24 hr tablet TAKE 1 TABLET BY MOUTH EVERY DAY WITH OR IMMEDIATELY FOLLOWING A MEAL 90 tablet 1   Multiple Vitamin (MULTIVITAMIN) tablet Take 1 tablet by mouth daily.     Omega-3 Fatty Acids (FISH OIL) 1000 MG CPDR Take 1,000 mg by mouth daily.     sodium chloride (OCEAN) 0.65 % SOLN nasal spray Place 1 spray into both nostrils as needed for congestion.     Turmeric (QC TUMERIC COMPLEX PO) Take 1 capsule by mouth daily.     Current Facility-Administered Medications  Medication Dose Route Frequency Provider Last Rate Last Admin   0.9 %  sodium chloride infusion  500 mL Intravenous Continuous Iva Boop, MD        Allergies as of 09/12/2022 - Review Complete 09/12/2022  Allergen Reaction Noted   Irbesartan  02/04/2014   Percocet [oxycodone-acetaminophen] Nausea And Vomiting 06/14/2013   Shingrix [zoster vac recomb adjuvanted] Itching 05/08/2021    Family History  Problem Relation Age of Onset   Arthritis Mother    Heart disease Mother    Hypertension Mother    Hypertension Father    Stroke Father    Hypertension Sister    Stroke Brother    Colon cancer Paternal Uncle     Social History   Socioeconomic History   Marital status: Married    Spouse name: Not on file   Number of children: Not on file   Years of education: Not on file   Highest education level: Not on file  Occupational History   Not on file  Tobacco Use   Smoking status: Former    Current packs/day: 0.00    Types: Cigarettes    Quit date: 02/25/1997    Years since quitting: 25.5    Passive exposure: Current   Smokeless tobacco: Never   Tobacco comments:    quit 1999  Vaping Use   Vaping status: Never Used  Substance and Sexual Activity   Alcohol use: Yes    Alcohol/week: 6.0 standard drinks of alcohol    Types: 6 Cans of beer per week    Comment: occasional   Drug use: No   Sexual activity: Yes  Other Topics Concern   Not on file  Social History Narrative   Not on file   Social Determinants  of Health   Financial Resource Strain: Low Risk  (08/21/2022)   [facility]   Overall Financial Resource Strain (CARDIA)    Difficulty of Paying Living Expenses: Not very hard  Food Insecurity: No Food Insecurity (08/21/2022)   Received from [facility]   Hunger Vital Sign    Worried About Running Out of Food in the Last Year: Never true    Ran Out of Food in the Last Year: Never true  Transportation Needs: No Transportation Needs (08/21/2022)   Received from [facility]   [facility] - Transportation    Lack  of Transportation (Medical): No    Lack of Transportation (Non-Medical): No  Physical Activity: Not on file  Stress: Not on file  Social Connections: Unknown (08/15/2022)   Received from [facility], [facility]   Social Network    Social Network: Not on file    Review of systems General: negative for malaise, night sweats, fever, chills, weight loss Neck: Negative for lumps, goiter, pain and significant neck swelling Resp: Negative for cough, wheezing, dyspnea at rest CV: Negative for chest pain, leg swelling, palpitations, orthopnea GI: denies melena, hematochezia, nausea, vomiting, diarrhea, constipation, dysphagia, odyonophagia, early satiety or unintentional weight loss.  MSK: Negative for joint pain or swelling, back pain, and muscle pain. Derm: Negative for itching or rash Psych: Denies depression, anxiety, memory loss, confusion. No homicidal or suicidal ideation.  Heme: Negative for prolonged bleeding, bruising easily, and swollen nodes. Endocrine: Negative for cold or heat intolerance, polyuria, polydipsia and goiter. Neuro: negative for tremor, gait imbalance, syncope and seizures. The remainder of the review of systems is noncontributory.  Physical Exam: BP 130/82 (BP Location: Left Arm, Patient Position: Sitting, Cuff Size: Large)   Pulse 77   Temp (!) 97.5 F (36.4 C) (Oral)   Ht 6\' 1"  (1.854 m)    Wt 224 lb 14.4 oz (102 kg)   BMI 29.67 kg/m  General:   Alert and oriented. No distress noted. Pleasant and cooperative.  Head:  Normocephalic and atraumatic. Eyes:  Conjuctiva clear without scleral icterus. Mouth:  Oral mucosa pink and moist. Good dentition. No lesions. Heart: Normal rate and rhythm, s1 and s2 heart sounds present.  Lungs: Clear lung sounds in all lobes. Respirations equal and unlabored. Abdomen:  +BS, soft, non-tender and non-distended. No rebound or guarding. No HSM or masses noted. Derm: No palmar erythema or jaundice Msk:  Symmetrical without gross deformities. Normal posture. Extremities:  Without edema. Neurologic:  Alert and  oriented x4 Psych:  Alert and cooperative. Normal mood and affect.  Invalid input(s): "6 MONTHS"   ASSESSMENT: JANELLE CULTON is a 67 y.o. male presenting today as a new patient for elevated LFTs  Notably LFTs elevated back in 2007-2011, normal until 2023 when they rose again. [place] of liver in September with hepatic steatosis, acute hep panel then was negative. 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   He does drink beer, 4-5 at each sitting, difficult to determine exactly how many days per week from provided history but sounds to be atleast 2-3 days during the week. No illicit drug use, new herbal supplements, new medications, he does take turmeric and glucosamine, has been on these for a while, both of which have showed some instances of elevated LFTs (studies on glucosamine unclear if this actually caused elevated LFTs or other additives contributed) but overall not significant cases of hepatotoxicity. Would be wise to consider these could be contributing. Pattern of elevation is mixed, will obtain US elastography to rule out fibrosis/cirrhosis especially in presence of frequent ETOH as well as other serologies to include A1A, ceruloplasmin, ANA, AMA, ASAM, iron studies to rule out  WIlson's disease, autoimmune diseases, iron overload as contributing. Ultimately made need to consider stopping herbal supplements if no underlying cause is found to explain LFT elevation. I did counsel the patient on importance of limiting alcohol use as well as implementation of Mediterranean diet/good weight management.   Last TCS in 2018 with 3mm TA, family history with paternal uncle with Colon  cancer, no first degree relatives, patient due for repeat colonoscopy in 2025.    PLAN:  US liver elastography  2. INR  3. Liver serologies as above 4. Repeat Colonoscopy 2025 5. Mediterranean diet 6. Good weight management, overall reduction 5-7% 7. Limit alcohol intake  All questions were answered, patient verbalized understanding and is in agreement with plan as outlined above.   Follow Up: 3 months   Deosha Werden L. Jeanmarie Hubert, MSN, APRN, AGNP-C Adult-Gerontology Nurse Practitioner [facility]  I have reviewed the note and agree with the APP's assessment as described in this progress note  Katrinka Blazing, MD Gastroenterology and Hepatology [facility]

## 2022-09-13 LAB — IRON, TOTAL/TOTAL IRON BINDING CAP
%SAT: 25 % (calc) (ref 20–48)
TIBC: 318 mcg/dL (calc) (ref 250–425)

## 2022-09-13 LAB — PROTIME-INR: Prothrombin Time: 10.3 s (ref 9.0–11.5)

## 2022-09-14 LAB — IGG, IGA, IGM: IgG (Immunoglobin G), Serum: 1480 mg/dL (ref 600–1540)

## 2022-09-16 LAB — IRON, TOTAL/TOTAL IRON BINDING CAP: Iron: 81 ug/dL (ref 50–180)

## 2022-09-16 LAB — IGG, IGA, IGM
IgM, Serum: 87 mg/dL (ref 50–300)
Immunoglobulin A: 171 mg/dL (ref 70–320)

## 2022-09-16 LAB — PROTIME-INR: INR: 0.9

## 2022-09-16 LAB — ALPHA-1-ANTITRYPSIN: A-1 Antitrypsin, Ser: 142 mg/dL (ref 83–199)

## 2022-09-19 LAB — ANTI-SMOOTH MUSCLE ANTIBODY, IGG: Actin (Smooth Muscle) Antibody (IGG): <20 U (ref <20)

## 2022-09-19 LAB — CERULOPLASMIN: Ceruloplasmin: 27 mg/dL (ref 14–30)

## 2022-09-19 LAB — MITOCHONDRIAL ANTIBODIES: Mitochondrial M2 Ab, IgG: <=20 U (ref <=20.0)

## 2022-09-19 LAB — ANA: Anti Nuclear Antibody (ANA): NEGATIVE

## 2022-09-25 ENCOUNTER — Ambulatory Visit (HOSPITAL_COMMUNITY)
Admission: RE | Admit: 2022-09-25 | Discharge: 2022-09-25 | Disposition: A | Discharge status: Home / Self Care | Payer: Medicare Other | Source: Ambulatory Visit | Attending: Gastroenterology | Admitting: Gastroenterology

## 2022-09-25 ENCOUNTER — Other Ambulatory Visit: Payer: Self-pay | Admitting: Family Medicine

## 2022-09-25 DIAGNOSIS — I10 Essential (primary) hypertension: Secondary | ICD-10-CM

## 2022-09-25 DIAGNOSIS — R932 Abnormal findings on diagnostic imaging of liver and biliary tract: Secondary | ICD-10-CM | POA: Diagnosis not present

## 2022-09-25 DIAGNOSIS — R7989 Other specified abnormal findings of blood chemistry: Secondary | ICD-10-CM | POA: Insufficient documentation

## 2022-09-26 DIAGNOSIS — Z87891 Personal history of nicotine dependence: Secondary | ICD-10-CM | POA: Diagnosis not present

## 2022-09-26 DIAGNOSIS — G9520 Unspecified cord compression: Secondary | ICD-10-CM | POA: Diagnosis not present

## 2022-09-26 DIAGNOSIS — K219 Gastro-esophageal reflux disease without esophagitis: Secondary | ICD-10-CM | POA: Diagnosis not present

## 2022-09-26 DIAGNOSIS — M47812 Spondylosis without myelopathy or radiculopathy, cervical region: Secondary | ICD-10-CM | POA: Diagnosis not present

## 2022-09-26 DIAGNOSIS — I10 Essential (primary) hypertension: Secondary | ICD-10-CM | POA: Diagnosis not present

## 2022-09-26 DIAGNOSIS — M5030 Other cervical disc degeneration, unspecified cervical region: Secondary | ICD-10-CM | POA: Diagnosis not present

## 2022-09-26 DIAGNOSIS — Z01812 Encounter for preprocedural laboratory examination: Secondary | ICD-10-CM | POA: Diagnosis not present

## 2022-10-08 DIAGNOSIS — M47812 Spondylosis without myelopathy or radiculopathy, cervical region: Secondary | ICD-10-CM | POA: Diagnosis not present

## 2022-10-08 DIAGNOSIS — G9520 Unspecified cord compression: Secondary | ICD-10-CM | POA: Diagnosis not present

## 2022-10-08 DIAGNOSIS — M5030 Other cervical disc degeneration, unspecified cervical region: Secondary | ICD-10-CM | POA: Diagnosis not present

## 2022-10-09 DIAGNOSIS — M4802 Spinal stenosis, cervical region: Secondary | ICD-10-CM | POA: Diagnosis not present

## 2022-10-09 DIAGNOSIS — M47812 Spondylosis without myelopathy or radiculopathy, cervical region: Secondary | ICD-10-CM | POA: Diagnosis not present

## 2022-10-09 DIAGNOSIS — Z981 Arthrodesis status: Secondary | ICD-10-CM | POA: Diagnosis not present

## 2022-10-09 DIAGNOSIS — G4730 Sleep apnea, unspecified: Secondary | ICD-10-CM | POA: Diagnosis not present

## 2022-10-09 DIAGNOSIS — M50021 Cervical disc disorder at C4-C5 level with myelopathy: Secondary | ICD-10-CM | POA: Diagnosis not present

## 2022-10-09 DIAGNOSIS — Z888 Allergy status to other drugs, medicaments and biological substances status: Secondary | ICD-10-CM | POA: Diagnosis not present

## 2022-10-09 DIAGNOSIS — Z87891 Personal history of nicotine dependence: Secondary | ICD-10-CM | POA: Diagnosis not present

## 2022-10-09 DIAGNOSIS — G9589 Other specified diseases of spinal cord: Secondary | ICD-10-CM | POA: Diagnosis not present

## 2022-10-09 DIAGNOSIS — K219 Gastro-esophageal reflux disease without esophagitis: Secondary | ICD-10-CM | POA: Diagnosis not present

## 2022-10-09 DIAGNOSIS — Z79899 Other long term (current) drug therapy: Secondary | ICD-10-CM | POA: Diagnosis not present

## 2022-10-09 DIAGNOSIS — M5031 Other cervical disc degeneration,  high cervical region: Secondary | ICD-10-CM | POA: Diagnosis not present

## 2022-10-09 DIAGNOSIS — M1990 Unspecified osteoarthritis, unspecified site: Secondary | ICD-10-CM | POA: Diagnosis not present

## 2022-10-09 DIAGNOSIS — M2578 Osteophyte, vertebrae: Secondary | ICD-10-CM | POA: Diagnosis not present

## 2022-10-09 DIAGNOSIS — G959 Disease of spinal cord, unspecified: Secondary | ICD-10-CM | POA: Diagnosis not present

## 2022-10-09 DIAGNOSIS — I10 Essential (primary) hypertension: Secondary | ICD-10-CM | POA: Diagnosis not present

## 2022-10-11 ENCOUNTER — Telehealth: Payer: Self-pay

## 2022-10-11 NOTE — Telephone Encounter (Signed)
Pt needs to handicap placard form completed by pcp. Intake form and handicap placard form placed in nurse's folder. Please call pt's wife Lion Schrantz when form is completed and available for pick up.   Cb#: [phone]

## 2022-10-21 DIAGNOSIS — M25812 Other specified joint disorders, left shoulder: Secondary | ICD-10-CM | POA: Diagnosis not present

## 2022-10-21 DIAGNOSIS — Z4789 Encounter for other orthopedic aftercare: Secondary | ICD-10-CM | POA: Diagnosis not present

## 2022-10-21 DIAGNOSIS — Z981 Arthrodesis status: Secondary | ICD-10-CM | POA: Diagnosis not present

## 2022-10-22 DIAGNOSIS — Z09 Encounter for follow-up examination after completed treatment for conditions other than malignant neoplasm: Secondary | ICD-10-CM | POA: Diagnosis not present

## 2022-10-22 DIAGNOSIS — M5030 Other cervical disc degeneration, unspecified cervical region: Secondary | ICD-10-CM | POA: Diagnosis not present

## 2022-10-22 DIAGNOSIS — G9520 Unspecified cord compression: Secondary | ICD-10-CM | POA: Diagnosis not present

## 2022-10-22 DIAGNOSIS — M47812 Spondylosis without myelopathy or radiculopathy, cervical region: Secondary | ICD-10-CM | POA: Diagnosis not present

## 2022-10-24 DIAGNOSIS — M47812 Spondylosis without myelopathy or radiculopathy, cervical region: Secondary | ICD-10-CM | POA: Diagnosis not present

## 2022-10-24 DIAGNOSIS — Z09 Encounter for follow-up examination after completed treatment for conditions other than malignant neoplasm: Secondary | ICD-10-CM | POA: Diagnosis not present

## 2022-10-24 DIAGNOSIS — M5030 Other cervical disc degeneration, unspecified cervical region: Secondary | ICD-10-CM | POA: Diagnosis not present

## 2022-10-24 DIAGNOSIS — G9520 Unspecified cord compression: Secondary | ICD-10-CM | POA: Diagnosis not present

## 2022-10-29 DIAGNOSIS — M5030 Other cervical disc degeneration, unspecified cervical region: Secondary | ICD-10-CM | POA: Diagnosis not present

## 2022-10-29 DIAGNOSIS — M47812 Spondylosis without myelopathy or radiculopathy, cervical region: Secondary | ICD-10-CM | POA: Diagnosis not present

## 2022-10-29 DIAGNOSIS — Z09 Encounter for follow-up examination after completed treatment for conditions other than malignant neoplasm: Secondary | ICD-10-CM | POA: Diagnosis not present

## 2022-10-29 DIAGNOSIS — G9520 Unspecified cord compression: Secondary | ICD-10-CM | POA: Diagnosis not present

## 2022-10-31 DIAGNOSIS — M62511 Muscle wasting and atrophy, not elsewhere classified, right shoulder: Secondary | ICD-10-CM | POA: Diagnosis not present

## 2022-10-31 DIAGNOSIS — M19012 Primary osteoarthritis, left shoulder: Secondary | ICD-10-CM | POA: Diagnosis not present

## 2022-10-31 DIAGNOSIS — M24112 Other articular cartilage disorders, left shoulder: Secondary | ICD-10-CM | POA: Diagnosis not present

## 2022-10-31 DIAGNOSIS — M75122 Complete rotator cuff tear or rupture of left shoulder, not specified as traumatic: Secondary | ICD-10-CM | POA: Diagnosis not present

## 2022-10-31 DIAGNOSIS — M948X1 Other specified disorders of cartilage, shoulder: Secondary | ICD-10-CM | POA: Diagnosis not present

## 2022-10-31 DIAGNOSIS — M67922 Unspecified disorder of synovium and tendon, left upper arm: Secondary | ICD-10-CM | POA: Diagnosis not present

## 2022-10-31 DIAGNOSIS — S46312A Strain of muscle, fascia and tendon of triceps, left arm, initial encounter: Secondary | ICD-10-CM | POA: Diagnosis not present

## 2022-11-07 ENCOUNTER — Other Ambulatory Visit: Payer: Medicare Other

## 2022-11-07 DIAGNOSIS — Z0000 Encounter for general adult medical examination without abnormal findings: Secondary | ICD-10-CM | POA: Diagnosis not present

## 2022-11-07 DIAGNOSIS — R739 Hyperglycemia, unspecified: Secondary | ICD-10-CM | POA: Diagnosis not present

## 2022-11-11 ENCOUNTER — Ambulatory Visit: Payer: Medicare Other | Admitting: Family Medicine

## 2022-11-11 VITALS — BP 138/82 | HR 84 | Temp 98.3°F | Ht 73.0 in | Wt 225.6 lb

## 2022-11-11 DIAGNOSIS — Z0001 Encounter for general adult medical examination with abnormal findings: Secondary | ICD-10-CM | POA: Diagnosis not present

## 2022-11-11 DIAGNOSIS — Z0000 Encounter for general adult medical examination without abnormal findings: Secondary | ICD-10-CM

## 2022-11-11 DIAGNOSIS — K760 Fatty (change of) liver, not elsewhere classified: Secondary | ICD-10-CM | POA: Diagnosis not present

## 2022-11-11 DIAGNOSIS — R7989 Other specified abnormal findings of blood chemistry: Secondary | ICD-10-CM

## 2022-11-11 DIAGNOSIS — I10 Essential (primary) hypertension: Secondary | ICD-10-CM

## 2022-11-11 LAB — COMPLETE METABOLIC PANEL WITH GFR
AG Ratio: 1.4 (calc) (ref 1.0–2.5)
ALT: 68 U/L — ABNORMAL HIGH (ref 9–46)
AST: 41 U/L — ABNORMAL HIGH (ref 10–35)
Albumin: 4.3 g/dL (ref 3.6–5.1)
Alkaline phosphatase (APISO): 70 U/L (ref 35–144)
BUN: 17 mg/dL (ref 7–25)
CO2: 26 mmol/L (ref 20–32)
Calcium: 10.1 mg/dL (ref 8.6–10.3)
Chloride: 100 mmol/L (ref 98–110)
Creat: 0.87 mg/dL (ref 0.70–1.35)
Globulin: 3.1 g/dL (ref 1.9–3.7)
Glucose, Bld: 138 mg/dL — ABNORMAL HIGH (ref 65–99)
Potassium: 3.9 mmol/L (ref 3.5–5.3)
Sodium: 137 mmol/L (ref 135–146)
Total Bilirubin: 0.7 mg/dL (ref 0.2–1.2)
Total Protein: 7.4 g/dL (ref 6.1–8.1)
eGFR: 95 mL/min/{1.73_m2} (ref >=60)

## 2022-11-11 LAB — CBC WITH DIFFERENTIAL/PLATELET
Absolute Monocytes: 614 {cells}/uL (ref 200–950)
Basophils Absolute: 59 {cells}/uL (ref 0–200)
Basophils Relative: 0.8 %
Eosinophils Absolute: 622 {cells}/uL — ABNORMAL HIGH (ref 15–500)
Eosinophils Relative: 8.4 %
HCT: 41.6 % (ref 38.5–50.0)
Hemoglobin: 14 g/dL (ref 13.2–17.1)
Lymphs Abs: 2072 {cells}/uL (ref 850–3900)
MCH: 30.3 pg (ref 27.0–33.0)
MCHC: 33.7 g/dL (ref 32.0–36.0)
MCV: 90 fL (ref 80.0–100.0)
MPV: 11 fL (ref 7.5–12.5)
Monocytes Relative: 8.3 %
Neutro Abs: 4033 {cells}/uL (ref 1500–7800)
Neutrophils Relative %: 54.5 %
Platelets: 211 10*3/uL (ref 140–400)
RBC: 4.62 10*6/uL (ref 4.20–5.80)
RDW: 12.1 % (ref 11.0–15.0)
Total Lymphocyte: 28 %
WBC: 7.4 10*3/uL (ref 3.8–10.8)

## 2022-11-11 LAB — PSA, TOTAL AND FREE
PSA, % Free: 33 % (ref >25)
PSA, Free: 0.3 ng/mL
PSA, Total: 0.9 ng/mL (ref <=4.0)

## 2022-11-11 LAB — LIPID PANEL
Cholesterol: 188 mg/dL (ref <200)
HDL: 41 mg/dL (ref >=40)
LDL Cholesterol (Calc): 118 mg/dL — ABNORMAL HIGH
Non-HDL Cholesterol (Calc): 147 mg/dL — ABNORMAL HIGH (ref <130)
Total CHOL/HDL Ratio: 4.6 (calc) (ref <5.0)
Triglycerides: 173 mg/dL — ABNORMAL HIGH (ref <150)

## 2022-11-11 LAB — TEST AUTHORIZATION

## 2022-11-11 LAB — TEST AUTHORIZATION 2

## 2022-11-11 LAB — HEMOGLOBIN A1C
Hgb A1c MFr Bld: 5.9 %{Hb} — ABNORMAL HIGH (ref <5.7)
Mean Plasma Glucose: 123 mg/dL
eAG (mmol/L): 6.8 mmol/L

## 2022-11-11 NOTE — Progress Notes (Signed)
Subjective:    Patient ID: Nathan Stewart, male    DOB: 01/01/1956, 67 y.o.   MRN: [id number]  HPI  Patient is a very pleasant 67 year old African-American gentleman here today for a physical exam.  He denies any falls or depression or memory loss.  He has a history of remote smoking although he quit 1999.  Since I last saw the patient, he had surgery for cervical radiculopathy.  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  He is due for colonoscopy in 2025.  He is due for a PSA.  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  His most recent lab work is listed below Lab on 11/07/2022  Component Date Value Ref Range Status   WBC 11/07/2022 7.4  3.8 - 10.8 Thousand/uL Final   RBC 11/07/2022 4.62  4.20 - 5.80 Million/uL Final   Hemoglobin 11/07/2022 14.0  13.2 - 17.1 g/dL Final   HCT 40/98/1191 41.6  38.5 - 50.0 % Final   MCV 11/07/2022 90.0  80.0 - 100.0 fL Final   MCH 11/07/2022 30.3  27.0 - 33.0 pg Final   MCHC 11/07/2022 33.7  32.0 - 36.0 g/dL Final   RDW 47/82/9562 12.1  11.0 - 15.0 % Final   Platelets 11/07/2022 211  140 - 400 Thousand/uL Final   MPV 11/07/2022 11.0  7.5 - 12.5 fL Final   Neutro Abs 11/07/2022 4,033  1,500 - 7,800 cells/uL Final   Lymphs Abs 11/07/2022 2,072  850 - 3,900 cells/uL Final   Absolute Monocytes 11/07/2022 614  200 - 950 cells/uL Final   Eosinophils Absolute 11/07/2022 622 (H)  15 - 500 cells/uL Final   Basophils Absolute 11/07/2022 59  0 - 200 cells/uL Final   Neutrophils Relative % 11/07/2022 54.5  % Final   Total Lymphocyte 11/07/2022 28.0  % Final   Monocytes Relative 11/07/2022 8.3  % Final   Eosinophils Relative 11/07/2022 8.4  % Final   Basophils Relative 11/07/2022 0.8  % Final   Glucose, Bld 11/07/2022 138 (H)  65 - 99 mg/dL Final   Comment: .            Fasting reference interval . For someone without known diabetes, a glucose value >125 mg/dL  indicates that they may have diabetes and this should be confirmed with a follow-up test. .    BUN 11/07/2022 17  7 - 25 mg/dL Final   Creat 13/09/6576 0.87  0.70 - 1.35 mg/dL Final   eGFR 46/96/2952 95  > OR = 60 mL/min/1.77m2 Final   BUN/Creatinine Ratio 11/07/2022 SEE NOTE:  6 - 22 (calc) Final   Comment:    Not Reported: BUN and Creatinine are within    reference range. .    Sodium 11/07/2022 137  135 - 146 mmol/L Final   Potassium 11/07/2022 3.9  3.5 - 5.3 mmol/L Final   Chloride 11/07/2022 100  98 - 110 mmol/L Final   CO2 11/07/2022 26  20 - 32 mmol/L Final   Calcium 11/07/2022 10.1  8.6 - 10.3 mg/dL Final   Total Protein 84/13/2440 7.4  6.1 - 8.1 g/dL Final   Albumin 12/22/2534 4.3  3.6 - 5.1 g/dL Final   Globulin 64/40/3474 3.1  1.9 - 3.7 g/dL (calc) Final   AG Ratio 11/07/2022 1.4  1.0 - 2.5 (calc) Final   Total Bilirubin 11/07/2022 0.7  0.2 - 1.2 mg/dL Final  Alkaline phosphatase (APISO) 11/07/2022 70  35 - 144 U/L Final   AST 11/07/2022 41 (H)  10 - 35 U/L Final   ALT 11/07/2022 68 (H)  9 - 46 U/L Final   Cholesterol 11/07/2022 188  <200 mg/dL Final   HDL 57/84/6962 41  > OR = 40 mg/dL Final   Triglycerides 95/28/4132 173 (H)  <150 mg/dL Final   LDL Cholesterol (Calc) 11/07/2022 118 (H)  mg/dL (calc) Final   Comment: Reference range: <100 . Desirable range <100 mg/dL for primary prevention;   <70 mg/dL for patients with CHD or diabetic patients  with > or = 2 CHD risk factors. Marland Kitchen LDL-C is now calculated using the Martin-Hopkins  calculation, which is a validated novel method providing  better accuracy than the Friedewald equation in the  estimation of LDL-C.  Horald Pollen et al. Lenox Ahr. 4401;027(25): 2061-2068  (http://education.[facility].com/faq/FAQ164)    Total CHOL/HDL Ratio 11/07/2022 4.6  <3.6 (calc) Final   Non-HDL Cholesterol (Calc) 11/07/2022 147 (H)  <130 mg/dL (calc) Final   Comment: For patients with diabetes plus 1 major ASCVD risk  factor,  treating to a non-HDL-C goal of <100 mg/dL  (LDL-C of <64 mg/dL) is considered a therapeutic  option.    Hgb A1c MFr Bld 11/07/2022 5.9 (H)  <5.7 % of total Hgb Final   Comment: For someone without known diabetes, a hemoglobin  A1c value between 5.7% and 6.4% is consistent with prediabetes and should be confirmed with a  follow-up test. . For someone with known diabetes, a value <7% indicates that their diabetes is well controlled. A1c targets should be individualized based on duration of diabetes, age, comorbid conditions, and other considerations. . This assay result is consistent with an increased risk of diabetes. . Currently, no consensus exists regarding use of hemoglobin A1c for diagnosis of diabetes for children. .    Mean Plasma Glucose 11/07/2022 123  mg/dL Final   eAG (mmol/L) 40/34/7425 6.8  mmol/L Final   Comment: . This test was performed on the Roche cobas c503 platform. Effective 12/03/21, a change in test platforms from the Abbott Architect to the Roche cobas c503 may have shifted HbA1c results compared to historical results. Based on laboratory validation testing conducted at [facility], the Roche platform relative to the Abbott platform had an average increase in HbA1c value of < or = 0.3%. This difference is within accepted  variability established by the [facility]. Note that not all individuals will have had a shift in their results and direct comparisons between historical and current results for testing conducted on different platforms is not recommended.    TEST NAME: 11/07/2022 HEMOGLOBIN A1c WITH eAG   Final   TEST CODE: 11/07/2022 [id number]   Final   CLIENT CONTACT: 11/07/2022 Lowella Grip   Final   REPORT ALWAYS MESSAGE SIGNATURE 11/07/2022    Final   Comment: . The laboratory testing on this patient was verbally requested or confirmed by the ordering physician or his or her authorized representative after  contact with an employee of [employer]. Federal regulations require that we maintain on file written authorization for all laboratory testing.  Accordingly we are asking that the ordering physician or his or her authorized representative sign a copy of this report and promptly return it to the client service representative. . . Signature:____________________________________________________ . Please fax this signed page to [phone] or return it via your [employer] courier.      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   GEN OSTEOARTHROSIS INVOLVING MULTIPLE SITES 11/10/2007   GERD 01/01/2007   Hx of adenomatous polyp of colon 04/04/2016   Hypertension    INSOMNIA, CHRONIC 11/13/2009   LIVER FUNCTION TESTS, ABNORMAL 08/27/2007   LOC OSTEOARTHROS NOT SPEC PRIM/SEC LOWER LEG 11/10/2007   Memory loss 08/27/2007   MENISCUS TEAR 10/30/2006   OSTEOARTHRITIS 06/07/2008   Pneumonia    SLEEP APNEA 09/20/2009   TESTICULAR HYPOFUNCTION 08/14/2009   Past Surgical History:  Procedure Laterality Date   COLONOSCOPY     HAND SURGERY  over 20 years ago   'metal plate in wrist'   JOINT REPLACEMENT N/A    [employer] 10/19/2019   MULTIPLE TOOTH EXTRACTIONS     SHOULDER SURGERY Right 2021   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   Procedure: TOTAL KNEE ARTHROPLASTY;  Surgeon: Gean Birchwood, MD;  Location: [facility] OR;  Service: Orthopedics;  Laterality: Left;   TOTAL KNEE ARTHROPLASTY Right 05/21/2021   Procedure: RIGHT TOTAL KNEE ARTHROPLASTY;  Surgeon: Gean Birchwood, MD;  Location: [facility];  Service: Orthopedics;  Laterality: Right;   Current Outpatient Medications on File Prior to Visit  Medication Sig Dispense Refill   ALPRAZolam (XANAX) 0.5 MG tablet TAKE 1 TABLET(0.5 MG) BY MOUTH THREE TIMES DAILY AS NEEDED FOR ANXIETY 30 tablet 2   amLODipine (NORVASC) 5 MG tablet TAKE 1 TABLET(5 MG) BY MOUTH DAILY 90 tablet 3   Bioflavonoid  Products (VITAMIN C PLUS) 1000 MG TABS Take 1 tablet by mouth daily. Vitamin C, Zinc, Elderberry     Glucosamine 500 MG CAPS Take 2 capsules by mouth daily.      hydrochlorothiazide (HYDRODIURIL) 25 MG tablet TAKE 1 TABLET(25 MG) BY MOUTH DAILY 90 tablet 1   metoprolol succinate (TOPROL-XL) 100 MG 24 hr tablet TAKE 1 TABLET BY MOUTH EVERY DAY WITH OR IMMEDIATELY FOLLOWING A MEAL 90 tablet 1   Multiple Vitamin (MULTIVITAMIN) tablet Take 1 tablet by mouth daily.     Omega-3 Fatty Acids (FISH OIL) 1000 MG CPDR Take 1,000 mg by mouth daily.     sodium chloride (OCEAN) 0.65 % SOLN nasal spray Place 1 spray into both nostrils as needed for congestion.     Turmeric (QC TUMERIC COMPLEX PO) Take 1 capsule by mouth daily.     Current Facility-Administered Medications on File Prior to Visit  Medication Dose Route Frequency Provider Last Rate Last Admin   0.9 %  sodium chloride infusion  500 mL Intravenous Continuous Iva Boop, MD       .all Social History   Socioeconomic History   Marital status: Married    Spouse name: Not on file   Number of children: Not on file   Years of education: Not on file   Highest education level: Not on file  Occupational History   Not on file  Tobacco Use   Smoking status: Former    Current packs/day: 0.00    Types: Cigarettes    Quit date: 02/25/1997    Years since quitting: 25.7    Passive exposure: Current   Smokeless tobacco: Never   Tobacco comments:    quit 1999  Vaping Use   Vaping status: Never Used  Substance and Sexual Activity   Alcohol use: Yes    Alcohol/week: 6.0 standard drinks of alcohol    Types: 6 Cans of beer per week    Comment: occasional   Drug use: No  Sexual activity: Yes  Other Topics Concern   Not on file  Social History Narrative   Not on file   Social Determinants of Health   Financial Resource Strain: Low Risk  (08/21/2022)   Received from [facility]   Overall Financial Resource Strain (CARDIA)     Difficulty of Paying Living Expenses: Not very hard  Food Insecurity: No Food Insecurity (08/21/2022)   Received from [facility], [facility]   Hunger Vital Sign    Worried About Running Out of Food in the Last Year: Never true    Ran Out of Food in the Last Year: Never true  Transportation Needs: No Transportation Needs (08/21/2022)   Received from [facility], [facility]   [facility] - Transportation    Lack of Transportation (Medical): No    Lack of Transportation (Non-Medical): No  Physical Activity: Not on file  Stress: No Stress Concern Present (10/09/2022)   Received from [facility] - Occupational Stress Questionnaire    Feeling of Stress : Not at all  Social Connections: Unknown (08/15/2022)   Received from [facility], [facility]   Social Network    Social Network: Not on file  Intimate Partner Violence: Not At Risk (10/09/2022)   Received from [facility]   HITS    Over the last 12 months how often did your partner physically hurt you?: 1    Over the last 12 months how often did your partner insult you or talk down to you?: 1    Over the last 12 months how often did your partner threaten you with physical harm?: 1    Over the last 12 months how often did your partner scream or curse at you?: 1   Family History  Problem Relation Age of Onset   Arthritis Mother    Heart disease Mother    Hypertension Mother    Hypertension Father    Stroke Father    Hypertension Sister    Stroke Brother    Colon cancer Paternal Uncle       Review of Systems  All other systems reviewed and are negative.      Objective:   Physical Exam Vitals reviewed.  Constitutional:      General: He is not in acute distress.    Appearance: Normal appearance. He is normal weight. He is not ill-appearing or toxic-appearing.  HENT:     Head: Normocephalic and atraumatic.     Right Ear: Tympanic membrane and ear canal normal.      Left Ear: Tympanic membrane and ear canal normal.     Nose: Nose normal. No congestion or rhinorrhea.     Mouth/Throat:     Mouth: Mucous membranes are moist.     Pharynx: Oropharynx is clear. No oropharyngeal exudate or posterior oropharyngeal erythema.  Eyes:     Extraocular Movements: Extraocular movements intact.     Conjunctiva/sclera: Conjunctivae normal.     Pupils: Pupils are equal, round, and reactive to light.  Neck:     Vascular: No carotid bruit.  Cardiovascular:     Rate and Rhythm: Normal rate and regular rhythm.     Pulses: Normal pulses.     Heart sounds: Normal heart sounds. No murmur heard.    No friction rub. No gallop.  Pulmonary:     Effort: Pulmonary effort is normal. No respiratory distress.     Breath sounds: Normal breath sounds. No wheezing, rhonchi or  rales.  Abdominal:     General: Abdomen is flat. Bowel sounds are normal. There is no distension.     Palpations: Abdomen is soft.     Tenderness: There is no abdominal tenderness. There is no rebound.     Hernia: No hernia is present.  Musculoskeletal:     Cervical back: Neck supple.     Right lower leg: No edema.     Left lower leg: No edema.  Lymphadenopathy:     Cervical: No cervical adenopathy.  Skin:    Coloration: Skin is not jaundiced or pale.     Findings: No bruising, erythema, lesion or rash.  Neurological:     General: No focal deficit present.     Mental Status: He is alert and oriented to person, place, and time. Mental status is at baseline.     Cranial Nerves: No cranial nerve deficit.     Motor: No weakness.     Gait: Gait normal.  Psychiatric:        Mood and Affect: Mood normal.        Behavior: Behavior normal.        Thought Content: Thought content normal.        Judgment: Judgment normal.           Assessment & Plan:  Routine general medical examination at a health care facility  Elevated liver function tests  Essential hypertension  NAFLD (nonalcoholic fatty  liver disease)  Patient's AAA screening was negative.  He does not require lung cancer screening.  Recommended colonoscopy in 2025.  Blood pressure is well-controlled.  Recommended a flu shot as well as a COVID shot.  The remainder of his immunizations are up-to-date.  He declines a flu shot today and he refuses a COVID shot.  Patient does have mild prediabetes.  Recommended reduction in his consumption of carbohydrates.  Spent 10 minutes discussing a low carbohydrate diet.  Recommended avoiding alcohol due to his history of fatty liver disease.  Increase fish oil to 2000 mg a day to address his hyperlipidemia

## 2022-11-12 DIAGNOSIS — M47812 Spondylosis without myelopathy or radiculopathy, cervical region: Secondary | ICD-10-CM | POA: Diagnosis not present

## 2022-11-12 DIAGNOSIS — Z09 Encounter for follow-up examination after completed treatment for conditions other than malignant neoplasm: Secondary | ICD-10-CM | POA: Diagnosis not present

## 2022-11-12 DIAGNOSIS — G9520 Unspecified cord compression: Secondary | ICD-10-CM | POA: Diagnosis not present

## 2022-11-12 DIAGNOSIS — M5030 Other cervical disc degeneration, unspecified cervical region: Secondary | ICD-10-CM | POA: Diagnosis not present

## 2022-11-13 DIAGNOSIS — M19212 Secondary osteoarthritis, left shoulder: Secondary | ICD-10-CM | POA: Diagnosis not present

## 2022-11-19 DIAGNOSIS — M5030 Other cervical disc degeneration, unspecified cervical region: Secondary | ICD-10-CM | POA: Diagnosis not present

## 2022-11-19 DIAGNOSIS — G9520 Unspecified cord compression: Secondary | ICD-10-CM | POA: Diagnosis not present

## 2022-11-19 DIAGNOSIS — Z09 Encounter for follow-up examination after completed treatment for conditions other than malignant neoplasm: Secondary | ICD-10-CM | POA: Diagnosis not present

## 2022-11-19 DIAGNOSIS — M47812 Spondylosis without myelopathy or radiculopathy, cervical region: Secondary | ICD-10-CM | POA: Diagnosis not present

## 2022-11-21 DIAGNOSIS — M47812 Spondylosis without myelopathy or radiculopathy, cervical region: Secondary | ICD-10-CM | POA: Diagnosis not present

## 2022-11-21 DIAGNOSIS — M5030 Other cervical disc degeneration, unspecified cervical region: Secondary | ICD-10-CM | POA: Diagnosis not present

## 2022-11-21 DIAGNOSIS — G9520 Unspecified cord compression: Secondary | ICD-10-CM | POA: Diagnosis not present

## 2022-11-21 DIAGNOSIS — Z09 Encounter for follow-up examination after completed treatment for conditions other than malignant neoplasm: Secondary | ICD-10-CM | POA: Diagnosis not present

## 2022-11-26 DIAGNOSIS — M5030 Other cervical disc degeneration, unspecified cervical region: Secondary | ICD-10-CM | POA: Diagnosis not present

## 2022-11-26 DIAGNOSIS — M47812 Spondylosis without myelopathy or radiculopathy, cervical region: Secondary | ICD-10-CM | POA: Diagnosis not present

## 2022-11-26 DIAGNOSIS — Z09 Encounter for follow-up examination after completed treatment for conditions other than malignant neoplasm: Secondary | ICD-10-CM | POA: Diagnosis not present

## 2022-11-26 DIAGNOSIS — G9520 Unspecified cord compression: Secondary | ICD-10-CM | POA: Diagnosis not present

## 2022-11-28 ENCOUNTER — Ambulatory Visit: Payer: Medicare Other

## 2022-11-28 VITALS — BP 134/72 | Ht 73.0 in | Wt 228.0 lb

## 2022-11-28 DIAGNOSIS — Z0000 Encounter for general adult medical examination without abnormal findings: Secondary | ICD-10-CM | POA: Diagnosis not present

## 2022-11-28 NOTE — Patient Instructions (Signed)
Nathan Stewart , Thank you for taking time to come for your Medicare Wellness Visit. I appreciate your ongoing commitment to your health goals. Please review the following plan we discussed and let me know if I can assist you in the future.   Referrals/Orders/Follow-Ups/Clinician Recommendations: Aim for 30 minutes of exercise or brisk walking, 6-8 glasses of water, and 5 servings of fruits and vegetables each day.  This is a list of the screening recommended for you and due dates:  Health Maintenance  Topic Date Due   Zoster (Shingles) Vaccine (1 of 2) 05/11/2005   Flu Shot  09/26/2022   COVID-19 Vaccine (7 - 2023-24 season) 10/27/2022   Medicare Annual Wellness Visit  11/07/2022   Colon Cancer Screening  03/29/2023   DTaP/Tdap/Td vaccine (4 - Td or Tdap) 10/08/2028   Pneumonia Vaccine  Completed   Hepatitis C Screening  Completed   HPV Vaccine  Aged Out    Advanced directives: Information on Advanced Care Planning can be found at [facility] of [facility] Advance Health Care Directives Advance Health Care Directives ([email or web address]   Next Medicare Annual Wellness Visit scheduled for next year: Yes

## 2022-11-28 NOTE — Progress Notes (Signed)
Subjective:   Nathan Stewart is a 67 y.o. male who presents for Medicare Annual/Subsequent preventive examination.  Visit Complete: In person  Cardiac Risk Factors include: advanced age (>30men, >59 women);dyslipidemia;hypertension;male gender     Objective:    Today's Vitals   11/28/22 1143  BP: 134/72  Weight: 228 lb (103.4 kg)  Height: 6\' 1"  (1.854 m)   Body mass index is 30.08 kg/m.     11/28/2022   12:03 PM 05/11/2021    9:20 AM 11/03/2020    8:30 AM 03/22/2016    2:26 PM 11/16/2014    8:36 AM  Advanced Directives  Does Patient Have a Medical Advance Directive? No Yes Yes Yes Yes  Type of Special educational needs teacher of [place];Living will Living will;Healthcare Power of [employer] Power of [place];Living will Living will  Does patient want to make changes to medical advance directive?   No - Patient declined  No - Patient declined  Copy of Healthcare Power of Attorney in Chart?  No - copy requested No - copy requested  No - copy requested  Would patient like information on creating a medical advance directive? Yes (MAU/Ambulatory/Procedural Areas - Information given)        Current Medications (verified) Outpatient Encounter Medications as of 11/28/2022  Medication Sig   ALPRAZolam (XANAX) 0.5 MG tablet TAKE 1 TABLET(0.5 MG) BY MOUTH THREE TIMES DAILY AS NEEDED FOR ANXIETY   amLODipine (NORVASC) 5 MG tablet TAKE 1 TABLET(5 MG) BY MOUTH DAILY   Bioflavonoid Products (VITAMIN C PLUS) 1000 MG TABS Take 1 tablet by mouth daily. Vitamin C, Zinc, Elderberry   Glucosamine 500 MG CAPS Take 2 capsules by mouth daily.    hydrochlorothiazide (HYDRODIURIL) 25 MG tablet TAKE 1 TABLET(25 MG) BY MOUTH DAILY   metoprolol succinate (TOPROL-XL) 100 MG 24 hr tablet TAKE 1 TABLET BY MOUTH EVERY DAY WITH OR IMMEDIATELY FOLLOWING A MEAL   Multiple Vitamin (MULTIVITAMIN) tablet Take 1 tablet by mouth daily.   Omega-3 Fatty Acids (FISH OIL) 1000 MG CPDR Take 1,000 mg by mouth  daily.   sodium chloride (OCEAN) 0.65 % SOLN nasal spray Place 1 spray into both nostrils as needed for congestion.   Turmeric (QC TUMERIC COMPLEX PO) Take 1 capsule by mouth daily.   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   GEN OSTEOARTHROSIS INVOLVING MULTIPLE SITES 11/10/2007   GERD 01/01/2007   Hx of adenomatous polyp of colon 04/04/2016   Hypertension    INSOMNIA, CHRONIC 11/13/2009   LIVER FUNCTION TESTS, ABNORMAL 08/27/2007   LOC OSTEOARTHROS NOT SPEC PRIM/SEC LOWER LEG 11/10/2007   Memory loss 08/27/2007   MENISCUS TEAR 10/30/2006   OSTEOARTHRITIS 06/07/2008   Pneumonia    SLEEP APNEA 09/20/2009   TESTICULAR HYPOFUNCTION 08/14/2009   Past Surgical History:  Procedure Laterality Date   COLONOSCOPY     HAND SURGERY  over 20 years ago   'metal plate in wrist'   JOINT REPLACEMENT N/A    Phreesia 10/19/2019   MULTIPLE TOOTH EXTRACTIONS     SHOULDER SURGERY Right 2021   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   Procedure: TOTAL KNEE ARTHROPLASTY;  Surgeon: Gean Birchwood, MD;  Location: [facility];  Service: Orthopedics;  Laterality:  Left;   TOTAL KNEE ARTHROPLASTY Right 05/21/2021   Procedure: RIGHT TOTAL KNEE ARTHROPLASTY;  Surgeon: Gean Birchwood, MD;  Location: [facility];  Service: Orthopedics;  Laterality: Right;   Family History  Problem Relation Age of Onset   Arthritis Mother    Heart disease Mother    Hypertension Mother    Hypertension Father    Stroke Father    Hypertension Sister    Stroke Brother    Colon cancer Paternal Uncle    Social History   Socioeconomic History   Marital status: Married    Spouse name: Not on file   Number of children: Not on  file   Years of education: Not on file   Highest education level: Not on file  Occupational History   Not on file  Tobacco Use   Smoking status: Former    Current packs/day: 0.00    Types: Cigarettes    Quit date: 02/25/1997    Years since quitting: 25.7    Passive exposure: Current   Smokeless tobacco: Never   Tobacco comments:    quit 1999  Vaping Use   Vaping status: Never Used  Substance and Sexual Activity   Alcohol use: Yes    Alcohol/week: 6.0 standard drinks of alcohol    Types: 6 Cans of beer per week    Comment: occasional   Drug use: No   Sexual activity: Yes  Other Topics Concern   Not on file  Social History Narrative   Not on file   Social Determinants of Health   Financial Resource Strain: Low Risk  (11/28/2022)   Overall Financial Resource Strain (CARDIA)    Difficulty of Paying Living Expenses: Not hard at all  Food Insecurity: No Food Insecurity (11/28/2022)   Hunger Vital Sign    Worried About Running Out of Food in the Last Year: Never true    Ran Out of Food in the Last Year: Never true  Transportation Needs: No Transportation Needs (11/28/2022)   PRAPARE - Administrator, Civil Service (Medical): No    Lack of Transportation (Non-Medical): No  Physical Activity: Inactive (11/28/2022)   Exercise Vital Sign    Days of Exercise per Week: 0 days    Minutes of Exercise per Session: 0 min  Stress: No Stress Concern Present (11/28/2022)   [employer] of Occupational Health - Occupational Stress Questionnaire    Feeling of Stress : Not at all  Social Connections: Socially Integrated (11/28/2022)   Social Connection and Isolation Panel [NHANES]    Frequency of Communication with Friends and Family: More than three times a week    Frequency of Social Gatherings with Friends and Family: Three times a week    Attends Religious Services: More than 4 times per year    Active Member of Clubs or Organizations: Yes    Attends Hospital doctor: More than 4 times per year    Marital Status: Married    Tobacco Counseling Counseling given: Not Answered Tobacco comments: quit 1999   Clinical Intake:  Pre-visit preparation completed: Yes  Pain : No/denies pain     Diabetes: No  How often do you need to have someone help you when you read instructions, pamphlets, or other written materials from your doctor or pharmacy?: 1 - Never  Interpreter Needed?: No  Information entered by :: Kandis Fantasia LPN   Activities of Daily Living    11/28/2022   11:51 AM  In your present state  of health, do you have any difficulty performing the following activities:  Hearing? 0  Vision? 0  Difficulty concentrating or making decisions? 0  Walking or climbing stairs? 0  Dressing or bathing? 0  Doing errands, shopping? 0  Preparing Food and eating ? N  Using the Toilet? N  In the past six months, have you accidently leaked urine? N  Do you have problems with loss of bowel control? N  Managing your Medications? N  Managing your Finances? N  Housekeeping or managing your Housekeeping? N    Patient Care Team: Donita Brooks, MD as PCP - General (Family Medicine) Diona Foley, MD as Consulting Physician (Ophthalmology) Jonetta Speak, NP as Nurse Practitioner (Neurosurgery) Raquel James, NP as Nurse Practitioner (Gastroenterology) [facility]  Indicate any recent Medical Services you may have received from other than [facility] providers in the past year (date may be approximate).     Assessment:   This is a routine wellness examination for Bhavya.  Hearing/Vision screen Hearing Screening - Comments:: Denies hearing difficulties   Vision Screening - Comments:: Wears rx glasses - up to date with routine eye exams with Dr.Chauni Zenaida Niece     Goals Addressed             This Visit's Progress    Complete shoulder revision surgery        Depression Screen    11/28/2022   12:00 PM  11/11/2022    9:26 AM 07/09/2022   10:36 AM 03/21/2022    3:35 PM 11/06/2021    8:25 AM 11/03/2020    8:30 AM 04/21/2020    8:57 AM  PHQ 2/9 Scores  PHQ - 2 Score 0 0 0 0 0 0 0  PHQ- 9 Score   0        Fall Risk    11/28/2022   12:03 PM 11/11/2022    9:26 AM 07/09/2022   10:32 AM 03/21/2022    3:34 PM 11/06/2021    8:25 AM  Fall Risk   Falls in the past year? 0 0 0 0 0  Number falls in past yr: 0  0 0 0  Injury with Fall? 0  0 0 0  Risk for fall due to : No Fall Risks  History of fall(s) No Fall Risks No Fall Risks  Follow up Falls prevention discussed;Education provided;Falls evaluation completed  Falls evaluation completed Falls prevention discussed Falls prevention discussed    MEDICARE RISK AT HOME: Medicare Risk at Home Any stairs in or around the home?: Yes If so, are there any without handrails?: No Home free of loose throw rugs in walkways, pet beds, electrical cords, etc?: Yes Adequate lighting in your home to reduce risk of falls?: Yes Life alert?: No Use of a cane, walker or w/c?: No Grab bars in the bathroom?: Yes Shower chair or bench in shower?: No Elevated toilet seat or a handicapped toilet?: No  TIMED UP AND GO:  Was the test performed?  Yes  Length of time to ambulate 10 feet: 7 sec Gait steady and fast without use of assistive device    Cognitive Function:        11/28/2022   12:11 PM  6CIT Screen  What Year? 0 points  What month? 0 points  What time? 0 points  Count back from 20 0 points  Months in reverse 0 points  Repeat phrase 0 points  Total Score 0 points    Immunizations Immunization  History  Administered Date(s) Administered   Fluad Quad(high Dose 65+) 12/21/2020, 01/23/2022   Influenza Whole 12/01/2007   Influenza,inj,Quad PF,6+ Mos 01/25/2013, 01/05/2016, 12/22/2017, 11/13/2018, 12/17/2019   PFIZER Comirnaty(Gray Top)Covid-19 Tri-Sucrose Vaccine 07/25/2020   PFIZER(Purple Top)SARS-COV-2 Vaccination 05/16/2019, 06/06/2019, 12/31/2019    PNEUMOCOCCAL CONJUGATE-20 11/06/2021   Pfizer Covid-19 Vaccine Bivalent Booster 39yrs & up 01/05/2021   Pfizer(Comirnaty)Fall Seasonal Vaccine 12 years and older 01/23/2022   Td 12/27/1998, 02/05/2008   Tdap 10/09/2018   Zoster, Live 06/23/2015    TDAP status: Up to date  Flu Vaccine status: Declined, Education has been provided regarding the importance of this vaccine but patient still declined. Advised may receive this vaccine at local pharmacy or Health Dept. Aware to provide a copy of the vaccination record if obtained from local pharmacy or Health Dept. Verbalized acceptance and understanding.  Pneumococcal vaccine status: Up to date  Covid-19 vaccine status: Information provided on how to obtain vaccines.   Qualifies for Shingles Vaccine? Yes   Zostavax completed No   Shingrix Completed?: No.    Education has been provided regarding the importance of this vaccine. Patient has been advised to call insurance company to determine out of pocket expense if they have not yet received this vaccine. Advised may also receive vaccine at local pharmacy or Health Dept. Verbalized acceptance and understanding.  Screening Tests Health Maintenance  Topic Date Due   COVID-19 Vaccine (7 - 2023-24 season) 10/27/2022   Colonoscopy  03/29/2023   Zoster Vaccines- Shingrix (1 of 2) 02/28/2023 (Originally 05/11/2005)   INFLUENZA VACCINE  05/26/2023 (Originally 09/26/2022)   Medicare Annual Wellness (AWV)  11/28/2023   DTaP/Tdap/Td (4 - Td or Tdap) 10/08/2028   Pneumonia Vaccine 50+ Years old  Completed   Hepatitis C Screening  Completed   HPV VACCINES  Aged Out    Health Maintenance  Health Maintenance Due  Topic Date Due   COVID-19 Vaccine (7 - 2023-24 season) 10/27/2022   Colonoscopy  03/29/2023    Colorectal cancer screening: Type of screening: Colonoscopy. Completed 03/28/16. Repeat every 7 years  Lung Cancer Screening: (Low Dose CT Chest recommended if Age 13-80 years, 20 pack-year  currently smoking OR have quit w/in 15years.) does not qualify.   Lung Cancer Screening Referral: n/a  Additional Screening:  Hepatitis C Screening: does qualify; Completed 07/09/22  Vision Screening: Recommended annual ophthalmology exams for early detection of glaucoma and other disorders of the eye. Is the patient up to date with their annual eye exam?  Yes  Who is the provider or what is the name of the office in which the patient attends annual eye exams? Dr. Demetra Shiner If pt is not established with a provider, would they like to be referred to a provider to establish care? No .   Dental Screening: Recommended annual dental exams for proper oral hygiene  Community Resource Referral / Chronic Care Management: CRR required this visit?  No   CCM required this visit?  No     Plan:     I have personally reviewed and noted the following in the patient's chart:   Medical and social history Use of alcohol, tobacco or illicit drugs  Current medications and supplements including opioid prescriptions. Patient is not currently taking opioid prescriptions. Functional ability and status Nutritional status Physical activity Advanced directives List of other physicians Hospitalizations, surgeries, and ER visits in previous 12 months Vitals Screenings to include cognitive, depression, and falls Referrals and appointments  In addition, I have reviewed and discussed  with patient certain preventive protocols, quality metrics, and best practice recommendations. A written personalized care plan for preventive services as well as general preventive health recommendations were provided to patient.     Kandis Fantasia [place], [place]   [id number]/[id number]   After Visit Summary: (In Person-Printed) AVS printed and given to the patient  Nurse Notes: No concerns at this time

## 2022-12-09 DIAGNOSIS — M25512 Pain in left shoulder: Secondary | ICD-10-CM | POA: Diagnosis not present

## 2022-12-13 ENCOUNTER — Other Ambulatory Visit: Payer: Self-pay | Admitting: Family Medicine

## 2022-12-13 NOTE — Telephone Encounter (Signed)
Requested medications are due for refill today.  yes  Requested medications are on the active medications list.  yes  Last refill. 09/05/2022 #30 2 rf  Future visit scheduled.   yes  Notes to clinic.  Refill not delegated.    Requested Prescriptions  Pending Prescriptions Disp Refills   ALPRAZolam (XANAX) 0.5 MG tablet [Pharmacy Med Name: ALPRAZOLAM 0.5MG  TABLETS] 30 tablet     Sig: TAKE 1 TABLET(0.5 MG) BY MOUTH THREE TIMES DAILY AS NEEDED FOR ANXIETY     Not Delegated - Psychiatry: Anxiolytics/Hypnotics 2 Failed - 12/13/2022  6:43 AM      Failed - This refill cannot be delegated      Failed - Urine Drug Screen completed in last 360 days      Failed - Valid encounter within last 6 months    Recent Outpatient Visits           2 years ago Screening for AAA (abdominal aortic aneurysm)   [facility] Donita Brooks, MD   2 years ago Essential hypertension   [facility] [place], Velna Hatchet, MD   3 years ago Routine general medical examination at a health care facility   [facility] [place], Velna Hatchet, MD   4 years ago Routine general medical examination at a health care facility   [facility] [place], Velna Hatchet, MD   4 years ago Essential hypertension   [facility] [place], Velna Hatchet, MD       Future Appointments             In 11 months Pickard, Priscille Heidelberg, MD [facility], [facility]            Passed - Patient is not pregnant

## 2022-12-17 DIAGNOSIS — M19012 Primary osteoarthritis, left shoulder: Secondary | ICD-10-CM | POA: Diagnosis not present

## 2022-12-17 DIAGNOSIS — M75102 Unspecified rotator cuff tear or rupture of left shoulder, not specified as traumatic: Secondary | ICD-10-CM | POA: Diagnosis not present

## 2022-12-17 DIAGNOSIS — M25712 Osteophyte, left shoulder: Secondary | ICD-10-CM | POA: Diagnosis not present

## 2022-12-17 DIAGNOSIS — G8918 Other acute postprocedural pain: Secondary | ICD-10-CM | POA: Diagnosis not present

## 2022-12-17 DIAGNOSIS — Z96612 Presence of left artificial shoulder joint: Secondary | ICD-10-CM | POA: Diagnosis not present

## 2022-12-19 ENCOUNTER — Other Ambulatory Visit: Payer: Self-pay | Admitting: Family Medicine

## 2022-12-19 ENCOUNTER — Telehealth: Payer: Self-pay

## 2022-12-19 MED ORDER — ALPRAZOLAM 0.5 MG PO TABS: 0.5000 mg | ORAL_TABLET | Freq: Three times a day (TID) | ORAL | 2 refills | Status: DC | PRN

## 2022-12-19 NOTE — Telephone Encounter (Signed)
Pt called in to check on status of this refill  ALPRAZolam (XANAX) 0.5 MG tablet [[id number]]  LOV: 11/11/22 CPE  PHARMACY: [id number] - [place], [place] - [address],  [place] [address] Phone: [phone]  Fax: [phone]    CB#: [phone]

## 2022-12-24 ENCOUNTER — Encounter (INDEPENDENT_AMBULATORY_CARE_PROVIDER_SITE_OTHER): Payer: Self-pay | Admitting: Gastroenterology

## 2022-12-24 ENCOUNTER — Ambulatory Visit (INDEPENDENT_AMBULATORY_CARE_PROVIDER_SITE_OTHER): Payer: Medicare Other | Admitting: Gastroenterology

## 2022-12-24 ENCOUNTER — Other Ambulatory Visit: Payer: Self-pay | Admitting: Family Medicine

## 2022-12-24 VITALS — BP 112/72 | HR 79 | Temp 97.9°F | Ht 74.0 in | Wt 230.5 lb

## 2022-12-24 DIAGNOSIS — F1090 Alcohol use, unspecified, uncomplicated: Secondary | ICD-10-CM | POA: Diagnosis not present

## 2022-12-24 DIAGNOSIS — R7989 Other specified abnormal findings of blood chemistry: Secondary | ICD-10-CM | POA: Diagnosis not present

## 2022-12-24 DIAGNOSIS — K7581 Nonalcoholic steatohepatitis (NASH): Secondary | ICD-10-CM | POA: Insufficient documentation

## 2022-12-24 NOTE — Patient Instructions (Signed)
Liver enzymes look much better, still mildly elevated Please continue to avoid glucosamine and turmeric like you are doing and limit alcohol intake Please be mindful of good weight management and healthy diet given your history of fatty liver We will recheck liver enzymes again in December You are due for colonoscopy in February 2025, we will reach out to you closer to time to schedule this  Follow up 6 months  It was a pleasure to see you today. I want to create trusting relationships with patients and provide genuine, compassionate, and quality care. I truly value your feedback! please be on the lookout for a survey regarding your visit with me today. I appreciate your input about our visit and your time in completing this!    Dylynn Ketner L. Jeanmarie Hubert, MSN, APRN, AGNP-C Adult-Gerontology Nurse Practitioner [facility] at [facility]

## 2022-12-24 NOTE — Telephone Encounter (Signed)
Requested Prescriptions  Pending Prescriptions Disp Refills   metoprolol succinate (TOPROL-XL) 100 MG 24 hr tablet [Pharmacy Med Name: METOPROLOL ER SUCCINATE 100MG  TABS] 90 tablet 1    Sig: TAKE 1 TABLET BY MOUTH EVERY DAY WITH OR IMMEDIATELY FOLLOWING A MEAL     Cardiovascular:  Beta Blockers Failed - 12/24/2022  6:23 AM      Failed - Valid encounter within last 6 months    Recent Outpatient Visits           2 years ago Screening for AAA (abdominal aortic aneurysm)   [facility] Donita Brooks, MD   2 years ago Essential hypertension   [facility] [place], Velna Hatchet, MD   3 years ago Routine general medical examination at a health care facility   [facility] [place], Velna Hatchet, MD   4 years ago Routine general medical examination at a health care facility   [facility] [place], Velna Hatchet, MD   4 years ago Essential hypertension   [facility] [place], Velna Hatchet, MD       Future Appointments             In 10 months Pickard, Priscille Heidelberg, MD [facility], [facility]            Passed - Last BP in normal range    BP Readings from Last 1 Encounters:  12/24/22 112/72         Passed - Last Heart Rate in normal range    Pulse Readings from Last 1 Encounters:  12/24/22 79

## 2022-12-24 NOTE — Progress Notes (Addendum)
Referring Provider: Donita Brooks, MD Primary Care Physician:  Donita Brooks, MD Primary GI Physician: Dr. Levon Hedger   Chief Complaint  Patient presents with   elevated liver function    Follow up on elevated liver function test. Not having any other concerns. Last labs done at pcp 9/12 at his physical.    HPI:   Nathan Stewart is a 67 y.o. male with past medical history of  GERD, adenomatous polyps of colon, HTN, OA, Sleep apnea, ED.   Patient presenting today for follow up of elevated LFTs  Last seen July 2024, at that time here for elevated LFTs labs in may with AST 57, ALT 98, T bili 0.7, AP 72, acute hep panel negative, platelet count 222k, albumin 4.6 Appears onset of LFT elevation in 2007, elevated through 2011, then per chart review, elevation began again in 2023.   Recommended US liver elastography, INR, liver serologies, repeat Colonoscopy 2025, mediterranean diet, limit alcohol intake  Labs and elastography as below, advised to stop OTC supplements  Most recent labs September 2024 with AST 41, ALT 68  Present: Notes he has had two surgeries since his last visit. He had neck surgery for a nerve impingement and his Left shoulder replaced. He stopped turmeric and glucosamine after his visit in July. He has stopped taking ibuprofen and has also cut alcohol intake down quite a bit, only having 1-2 on the weekend now. Doing well today. He has no GI complaints. No red flag symptoms. Patient denies melena, hematochezia, nausea, vomiting, diarrhea, constipation, dysphagia, odyonophagia, early satiety or weight loss.   Pertinent labs/testing: July 2024 -[place] elastography 08/2022 median kPa 4.8, Diffusely echogenic hepatic parenchyma consistent with fatty liver infiltration. 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   GEN OSTEOARTHROSIS INVOLVING MULTIPLE SITES 11/10/2007   GERD 01/01/2007   Hx of adenomatous polyp of colon 04/04/2016   Hypertension    INSOMNIA, CHRONIC 11/13/2009   LIVER FUNCTION TESTS, ABNORMAL 08/27/2007   LOC OSTEOARTHROS NOT SPEC PRIM/SEC LOWER LEG 11/10/2007   Memory loss 08/27/2007   MENISCUS TEAR 10/30/2006   OSTEOARTHRITIS 06/07/2008   Pneumonia    SLEEP APNEA 09/20/2009   TESTICULAR HYPOFUNCTION 08/14/2009    Past Surgical History:  Procedure Laterality Date   COLONOSCOPY     HAND SURGERY  over 20 years ago   'metal plate in wrist'   JOINT REPLACEMENT N/A    [employer] 10/19/2019   MULTIPLE TOOTH EXTRACTIONS     SHOULDER SURGERY Right 2021   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   Procedure: TOTAL KNEE ARTHROPLASTY;  Surgeon: Gean Birchwood, MD;  Location: [facility] OR;  Service: Orthopedics;  Laterality: Left;   TOTAL KNEE ARTHROPLASTY Right 05/21/2021   Procedure: RIGHT TOTAL KNEE ARTHROPLASTY;  Surgeon: Gean Birchwood, MD;  Location: [facility];  Service: Orthopedics;  Laterality: Right;    Current Outpatient Medications  Medication Sig Dispense Refill   ALPRAZolam (XANAX) 0.5 MG tablet Take 1 tablet (0.5 mg total) by mouth 3 (three) times daily as needed for anxiety. 30 tablet 2   amLODipine (NORVASC) 5 MG tablet TAKE 1 TABLET(5 MG) BY MOUTH DAILY 90 tablet  3   Bioflavonoid Products (VITAMIN C PLUS) 1000 MG TABS Take 1 tablet by mouth daily. Vitamin C, Zinc, Elderberry     hydrochlorothiazide (HYDRODIURIL) 25 MG tablet TAKE 1 TABLET(25 MG) BY MOUTH DAILY 90 tablet 1   metoprolol succinate (TOPROL-XL) 100 MG 24 hr tablet TAKE 1 TABLET BY MOUTH EVERY DAY WITH OR IMMEDIATELY FOLLOWING A MEAL 90 tablet 1   Multiple Vitamin (MULTIVITAMIN) tablet Take 1 tablet by mouth daily.     Omega-3 Fatty Acids (FISH  OIL) 1000 MG CPDR Take 2,000 mg by mouth daily.     sodium chloride (OCEAN) 0.65 % SOLN nasal spray Place 1 spray into both nostrils as needed for congestion.     Current Facility-Administered Medications  Medication Dose Route Frequency Provider Last Rate Last Admin   0.9 %  sodium chloride infusion  500 mL Intravenous Continuous Iva Boop, MD        Allergies as of 12/24/2022 - Review Complete 12/24/2022  Allergen Reaction Noted   Hydrocodone-acetaminophen Nausea And Vomiting 09/18/2022   Irbesartan  02/04/2014   Percocet [oxycodone-acetaminophen] Nausea And Vomiting 06/14/2013   Shingrix [zoster vac recomb adjuvanted] Itching 05/08/2021    Family History  Problem Relation Age of Onset   Arthritis Mother    Heart disease Mother    Hypertension Mother    Hypertension Father    Stroke Father    Hypertension Sister    Stroke Brother    Colon cancer Paternal Uncle     Social History   Socioeconomic History   Marital status: Married    Spouse name: Not on file   Number of children: Not on file   Years of education: Not on file   Highest education level: Not on file  Occupational History   Not on file  Tobacco Use   Smoking status: Former    Current packs/day: 0.00    Types: Cigarettes    Quit date: 02/25/1997    Years since quitting: 25.8    Passive exposure: Current   Smokeless tobacco: Never   Tobacco comments:    quit 1999  Vaping Use   Vaping status: Never Used  Substance and Sexual Activity   Alcohol use: Yes    Alcohol/week: 6.0 standard drinks of alcohol    Types: 6 Cans of beer per week    Comment: occasional   Drug use: No   Sexual activity: Yes  Other Topics Concern   Not on file  Social History Narrative   Not on file   Social Determinants of Health   Financial Resource Strain: Low Risk  (12/07/2022)   Received from [facility]   Overall Financial Resource Strain (CARDIA)    Difficulty of Paying Living Expenses: Not hard at all  Food  Insecurity: No Food Insecurity (11/28/2022)   Hunger Vital Sign    Worried About Running Out of Food in the Last Year: Never true    Ran Out of Food in the Last Year: Never true  Transportation Needs: No Transportation Needs (11/28/2022)   PRAPARE - Administrator, Civil Service (Medical): No    Lack of Transportation (Non-Medical): No  Physical Activity: Unknown (12/07/2022)   [facility]   Exercise Vital Sign    Days of Exercise per Week: 3 days    Minutes of Exercise per Session: Not on file  Recent Concern: Physical Activity - Inactive (11/28/2022)   Exercise Vital Sign    Days of Exercise per Week: 0  days    Minutes of Exercise per Session: 0 min  Stress: No Stress Concern Present (11/28/2022)   [employer] of Occupational Health - Occupational Stress Questionnaire    Feeling of Stress : Not at all  Social Connections: Socially Integrated (12/07/2022)   Received from [facility]   Social Network    How would you rate your social network (family, work, friends)?: Good participation with social networks   Review of systems General: negative for malaise, night sweats, fever, chills, weight loss Neck: Negative for lumps, goiter, pain and significant neck swelling Resp: Negative for cough, wheezing, dyspnea at rest CV: Negative for chest pain, leg swelling, palpitations, orthopnea GI: denies melena, hematochezia, nausea, vomiting, diarrhea, constipation, dysphagia, odyonophagia, early satiety or unintentional weight loss.  The remainder of the review of systems is noncontributory.  Physical Exam: There were no vitals taken for this visit. General:   Alert and oriented. No distress noted. Pleasant and cooperative.  Head:  Normocephalic and atraumatic. Eyes:  Conjuctiva clear without scleral icterus. Mouth:  Oral mucosa pink and moist. Good dentition. No lesions. Heart: Normal rate and rhythm, s1 and s2 heart sounds present.  Lungs: Clear lung  sounds in all lobes. Respirations equal and unlabored. Abdomen:  +BS, soft, non-tender and non-distended. No rebound or guarding. No HSM or masses noted. Extremities:  Without edema. Neurologic:  Alert and  oriented x4 Psych:  Alert and cooperative. Normal mood and affect.  Invalid input(s): "6 MONTHS"   ASSESSMENT: NOMAR BROAD is a 67 y.o. male presenting today for follow up of elevated LFTs   Previous liver serologies, Iron studies WNL, Elastography mostly consistent with fatty liver. LFTs have continued to trend down over the past few months. He did stop glucosamine and turmeric as instructed. Has also cut back on etoh intake, query if both supplements and ETOH were contributing to his elevated LFTs. He should continue to avoid herbal supplements and limit ETOH. Encouraged on good weight management and healthy diet (mediterranean diet provided previously). Will repeat HFP again in December.   He is due for colonoscopy for history of TAs again in February 2025, we will ensure he is on the recall list to get this scheduled closer to due time.   PLAN:  Repeat HFP December  2. Continue to avoid herbal supplements  3. Limit ETOH intake 4. Good weight management and mediterranean diet 5. Repeat Colonoscopy February 2025  All questions were answered, patient verbalized understanding and is in agreement with plan as outlined above.   Follow Up: 6 months   Katha Kuehne L. 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 Agree with plan and alcohol cessation  Katrinka Blazing, MD Gastroenterology and Hepatology [facility]

## 2022-12-30 DIAGNOSIS — M19012 Primary osteoarthritis, left shoulder: Secondary | ICD-10-CM | POA: Diagnosis not present

## 2023-01-03 ENCOUNTER — Other Ambulatory Visit (INDEPENDENT_AMBULATORY_CARE_PROVIDER_SITE_OTHER): Payer: Self-pay | Admitting: *Deleted

## 2023-01-03 ENCOUNTER — Telehealth (INDEPENDENT_AMBULATORY_CARE_PROVIDER_SITE_OTHER): Payer: Self-pay | Admitting: *Deleted

## 2023-01-03 ENCOUNTER — Ambulatory Visit: Payer: Medicare Other | Admitting: Family Medicine

## 2023-01-03 ENCOUNTER — Ambulatory Visit (INDEPENDENT_AMBULATORY_CARE_PROVIDER_SITE_OTHER): Payer: Medicare Other

## 2023-01-03 DIAGNOSIS — Z23 Encounter for immunization: Secondary | ICD-10-CM

## 2023-01-03 DIAGNOSIS — R7989 Other specified abnormal findings of blood chemistry: Secondary | ICD-10-CM

## 2023-01-03 NOTE — Telephone Encounter (Addendum)
Called patient and left on his vm that he is due for labwork and I would mail order to him to do at [facility] and to call back if any questions.   ----- Message from Nurse Toniann Fail R sent at 10/02/2022  3:35 PM EDT ----- Regarding: labs HFP due in 3 months per chelsea ( due around 01/02/23 )

## 2023-01-14 DIAGNOSIS — R531 Weakness: Secondary | ICD-10-CM | POA: Diagnosis not present

## 2023-01-14 DIAGNOSIS — Z96612 Presence of left artificial shoulder joint: Secondary | ICD-10-CM | POA: Diagnosis not present

## 2023-01-14 DIAGNOSIS — M25612 Stiffness of left shoulder, not elsewhere classified: Secondary | ICD-10-CM | POA: Diagnosis not present

## 2023-01-15 DIAGNOSIS — R531 Weakness: Secondary | ICD-10-CM | POA: Diagnosis not present

## 2023-01-15 DIAGNOSIS — M25612 Stiffness of left shoulder, not elsewhere classified: Secondary | ICD-10-CM | POA: Diagnosis not present

## 2023-01-15 DIAGNOSIS — Z96612 Presence of left artificial shoulder joint: Secondary | ICD-10-CM | POA: Diagnosis not present

## 2023-01-21 DIAGNOSIS — R531 Weakness: Secondary | ICD-10-CM | POA: Diagnosis not present

## 2023-01-21 DIAGNOSIS — M25612 Stiffness of left shoulder, not elsewhere classified: Secondary | ICD-10-CM | POA: Diagnosis not present

## 2023-01-21 DIAGNOSIS — Z96612 Presence of left artificial shoulder joint: Secondary | ICD-10-CM | POA: Diagnosis not present

## 2023-01-28 DIAGNOSIS — R531 Weakness: Secondary | ICD-10-CM | POA: Diagnosis not present

## 2023-01-28 DIAGNOSIS — M25612 Stiffness of left shoulder, not elsewhere classified: Secondary | ICD-10-CM | POA: Diagnosis not present

## 2023-01-28 DIAGNOSIS — Z96612 Presence of left artificial shoulder joint: Secondary | ICD-10-CM | POA: Diagnosis not present

## 2023-01-30 DIAGNOSIS — Z96612 Presence of left artificial shoulder joint: Secondary | ICD-10-CM | POA: Diagnosis not present

## 2023-01-30 DIAGNOSIS — R531 Weakness: Secondary | ICD-10-CM | POA: Diagnosis not present

## 2023-01-30 DIAGNOSIS — M25612 Stiffness of left shoulder, not elsewhere classified: Secondary | ICD-10-CM | POA: Diagnosis not present

## 2023-02-03 DIAGNOSIS — M47812 Spondylosis without myelopathy or radiculopathy, cervical region: Secondary | ICD-10-CM | POA: Diagnosis not present

## 2023-02-03 DIAGNOSIS — R531 Weakness: Secondary | ICD-10-CM | POA: Diagnosis not present

## 2023-02-03 DIAGNOSIS — G9520 Unspecified cord compression: Secondary | ICD-10-CM | POA: Diagnosis not present

## 2023-02-03 DIAGNOSIS — M25612 Stiffness of left shoulder, not elsewhere classified: Secondary | ICD-10-CM | POA: Diagnosis not present

## 2023-02-03 DIAGNOSIS — M5030 Other cervical disc degeneration, unspecified cervical region: Secondary | ICD-10-CM | POA: Diagnosis not present

## 2023-02-03 DIAGNOSIS — Z96612 Presence of left artificial shoulder joint: Secondary | ICD-10-CM | POA: Diagnosis not present

## 2023-02-06 ENCOUNTER — Telehealth (INDEPENDENT_AMBULATORY_CARE_PROVIDER_SITE_OTHER): Payer: Self-pay | Admitting: *Deleted

## 2023-02-06 DIAGNOSIS — R531 Weakness: Secondary | ICD-10-CM | POA: Diagnosis not present

## 2023-02-06 DIAGNOSIS — M25612 Stiffness of left shoulder, not elsewhere classified: Secondary | ICD-10-CM | POA: Diagnosis not present

## 2023-02-06 DIAGNOSIS — Z96612 Presence of left artificial shoulder joint: Secondary | ICD-10-CM | POA: Diagnosis not present

## 2023-02-06 NOTE — Telephone Encounter (Addendum)
Pt in reminder file: Per chelsea due for HFP in December due to elevated LFT's. ( Pt said he does labs in [place] and to mail him the order for [facility]) he said end of December would be best for him.  Orders put in for [facility] and mailed to patient

## 2023-02-11 DIAGNOSIS — Z96612 Presence of left artificial shoulder joint: Secondary | ICD-10-CM | POA: Diagnosis not present

## 2023-02-11 DIAGNOSIS — M25612 Stiffness of left shoulder, not elsewhere classified: Secondary | ICD-10-CM | POA: Diagnosis not present

## 2023-02-11 DIAGNOSIS — R531 Weakness: Secondary | ICD-10-CM | POA: Diagnosis not present

## 2023-02-13 DIAGNOSIS — H524 Presbyopia: Secondary | ICD-10-CM | POA: Diagnosis not present

## 2023-02-13 DIAGNOSIS — H04123 Dry eye syndrome of bilateral lacrimal glands: Secondary | ICD-10-CM | POA: Diagnosis not present

## 2023-02-13 DIAGNOSIS — H40033 Anatomical narrow angle, bilateral: Secondary | ICD-10-CM | POA: Diagnosis not present

## 2023-02-13 DIAGNOSIS — H40023 Open angle with borderline findings, high risk, bilateral: Secondary | ICD-10-CM | POA: Diagnosis not present

## 2023-02-13 DIAGNOSIS — R531 Weakness: Secondary | ICD-10-CM | POA: Diagnosis not present

## 2023-02-13 DIAGNOSIS — Z96612 Presence of left artificial shoulder joint: Secondary | ICD-10-CM | POA: Diagnosis not present

## 2023-02-13 DIAGNOSIS — M25612 Stiffness of left shoulder, not elsewhere classified: Secondary | ICD-10-CM | POA: Diagnosis not present

## 2023-02-17 DIAGNOSIS — M25612 Stiffness of left shoulder, not elsewhere classified: Secondary | ICD-10-CM | POA: Diagnosis not present

## 2023-02-17 DIAGNOSIS — R531 Weakness: Secondary | ICD-10-CM | POA: Diagnosis not present

## 2023-02-17 DIAGNOSIS — Z96612 Presence of left artificial shoulder joint: Secondary | ICD-10-CM | POA: Diagnosis not present

## 2023-02-18 DIAGNOSIS — M25612 Stiffness of left shoulder, not elsewhere classified: Secondary | ICD-10-CM | POA: Diagnosis not present

## 2023-02-18 DIAGNOSIS — Z96612 Presence of left artificial shoulder joint: Secondary | ICD-10-CM | POA: Diagnosis not present

## 2023-02-18 DIAGNOSIS — R531 Weakness: Secondary | ICD-10-CM | POA: Diagnosis not present

## 2023-02-21 DIAGNOSIS — M25612 Stiffness of left shoulder, not elsewhere classified: Secondary | ICD-10-CM | POA: Diagnosis not present

## 2023-02-24 DIAGNOSIS — Z96612 Presence of left artificial shoulder joint: Secondary | ICD-10-CM | POA: Diagnosis not present

## 2023-02-24 DIAGNOSIS — R531 Weakness: Secondary | ICD-10-CM | POA: Diagnosis not present

## 2023-02-24 DIAGNOSIS — M25612 Stiffness of left shoulder, not elsewhere classified: Secondary | ICD-10-CM | POA: Diagnosis not present

## 2023-02-27 DIAGNOSIS — Z96612 Presence of left artificial shoulder joint: Secondary | ICD-10-CM | POA: Diagnosis not present

## 2023-02-27 DIAGNOSIS — R531 Weakness: Secondary | ICD-10-CM | POA: Diagnosis not present

## 2023-02-27 DIAGNOSIS — M25612 Stiffness of left shoulder, not elsewhere classified: Secondary | ICD-10-CM | POA: Diagnosis not present

## 2023-02-28 ENCOUNTER — Encounter (INDEPENDENT_AMBULATORY_CARE_PROVIDER_SITE_OTHER): Payer: Self-pay | Admitting: *Deleted

## 2023-03-05 DIAGNOSIS — Z96612 Presence of left artificial shoulder joint: Secondary | ICD-10-CM | POA: Diagnosis not present

## 2023-03-05 DIAGNOSIS — M25612 Stiffness of left shoulder, not elsewhere classified: Secondary | ICD-10-CM | POA: Diagnosis not present

## 2023-03-05 DIAGNOSIS — R531 Weakness: Secondary | ICD-10-CM | POA: Diagnosis not present

## 2023-03-07 DIAGNOSIS — M25612 Stiffness of left shoulder, not elsewhere classified: Secondary | ICD-10-CM | POA: Diagnosis not present

## 2023-03-07 DIAGNOSIS — R531 Weakness: Secondary | ICD-10-CM | POA: Diagnosis not present

## 2023-03-07 DIAGNOSIS — Z96612 Presence of left artificial shoulder joint: Secondary | ICD-10-CM | POA: Diagnosis not present

## 2023-03-07 DIAGNOSIS — M25511 Pain in right shoulder: Secondary | ICD-10-CM | POA: Diagnosis not present

## 2023-03-07 DIAGNOSIS — M6281 Muscle weakness (generalized): Secondary | ICD-10-CM | POA: Diagnosis not present

## 2023-03-10 ENCOUNTER — Other Ambulatory Visit: Payer: Self-pay

## 2023-03-10 ENCOUNTER — Encounter (HOSPITAL_COMMUNITY): Payer: Self-pay | Admitting: Orthopedic Surgery

## 2023-03-10 ENCOUNTER — Other Ambulatory Visit: Payer: Self-pay | Admitting: Orthopedic Surgery

## 2023-03-10 DIAGNOSIS — M25512 Pain in left shoulder: Secondary | ICD-10-CM | POA: Diagnosis not present

## 2023-03-10 NOTE — Progress Notes (Addendum)
 COVID Vaccine Completed:  Yes  Date of COVID positive in last 90 days:  No  PCP - Butler Burr, MD Cardiologist - N/A  Chest x-ray - N/A EKG - Day of surgery Stress Test - N/A ECHO - N/A Cardiac Cath - N/A Pacemaker/ICD device last checked: Spinal Cord Stimulator:N/A  Bowel Prep -  N/A  Sleep Study - Yes, mild sleep apnea CPAP - No  Fasting Blood Sugar - N/A Checks Blood Sugar _____ times a day  Last dose of GLP1 agonist-  N/A GLP1 instructions:  Hold 7 days before surgery    Last dose of SGLT-2 inhibitors-  N/A SGLT-2 instructions:  Hold 3 days before surgery    Blood Thinner Instructions:  N/A Aspirin  Instructions: Last Dose:  Activity level:  Can go up a flight of stairs and perform activities of daily living without stopping and without symptoms of chest pain or shortness of breath.  Anesthesia review: N/A  Patient denies shortness of breath, fever, cough and chest pain at PAT appointment  Patient verbalized understanding of instructions that were given to them at the PAT appointment. Patient was also instructed that they will need to review over the PAT instructions again at home before surgery.

## 2023-03-11 ENCOUNTER — Inpatient Hospital Stay (HOSPITAL_COMMUNITY): Payer: Medicare Other | Admitting: Anesthesiology

## 2023-03-11 ENCOUNTER — Encounter (HOSPITAL_COMMUNITY)
Admission: RE | Disposition: A | Discharge status: Home / Self Care | Payer: Self-pay | Source: Ambulatory Visit | Attending: Orthopedic Surgery

## 2023-03-11 ENCOUNTER — Other Ambulatory Visit: Payer: Self-pay

## 2023-03-11 ENCOUNTER — Encounter (HOSPITAL_COMMUNITY): Payer: Self-pay | Admitting: Orthopedic Surgery

## 2023-03-11 ENCOUNTER — Inpatient Hospital Stay (HOSPITAL_COMMUNITY)
Admission: RE | Admit: 2023-03-11 | Discharge: 2023-03-11 | DRG: 483 | Disposition: A | Discharge status: Home / Self Care | Payer: Medicare Other | Source: Ambulatory Visit | Attending: Orthopedic Surgery | Admitting: Orthopedic Surgery

## 2023-03-11 DIAGNOSIS — J189 Pneumonia, unspecified organism: Secondary | ICD-10-CM | POA: Diagnosis not present

## 2023-03-11 DIAGNOSIS — E291 Testicular hypofunction: Secondary | ICD-10-CM | POA: Diagnosis present

## 2023-03-11 DIAGNOSIS — G4700 Insomnia, unspecified: Secondary | ICD-10-CM | POA: Diagnosis not present

## 2023-03-11 DIAGNOSIS — G4733 Obstructive sleep apnea (adult) (pediatric): Secondary | ICD-10-CM | POA: Diagnosis not present

## 2023-03-11 DIAGNOSIS — K769 Liver disease, unspecified: Secondary | ICD-10-CM

## 2023-03-11 DIAGNOSIS — Z888 Allergy status to other drugs, medicaments and biological substances status: Secondary | ICD-10-CM | POA: Diagnosis not present

## 2023-03-11 DIAGNOSIS — I10 Essential (primary) hypertension: Secondary | ICD-10-CM

## 2023-03-11 DIAGNOSIS — Y838 Other surgical procedures as the cause of abnormal reaction of the patient, or of later complication, without mention of misadventure at the time of the procedure: Secondary | ICD-10-CM | POA: Diagnosis present

## 2023-03-11 DIAGNOSIS — K219 Gastro-esophageal reflux disease without esophagitis: Secondary | ICD-10-CM | POA: Diagnosis not present

## 2023-03-11 DIAGNOSIS — Z8701 Personal history of pneumonia (recurrent): Secondary | ICD-10-CM

## 2023-03-11 DIAGNOSIS — Z887 Allergy status to serum and vaccine status: Secondary | ICD-10-CM | POA: Diagnosis not present

## 2023-03-11 DIAGNOSIS — Z8249 Family history of ischemic heart disease and other diseases of the circulatory system: Secondary | ICD-10-CM

## 2023-03-11 DIAGNOSIS — T84028A Dislocation of other internal joint prosthesis, initial encounter: Secondary | ICD-10-CM | POA: Diagnosis not present

## 2023-03-11 DIAGNOSIS — N529 Male erectile dysfunction, unspecified: Secondary | ICD-10-CM | POA: Diagnosis present

## 2023-03-11 DIAGNOSIS — M159 Polyosteoarthritis, unspecified: Secondary | ICD-10-CM | POA: Diagnosis not present

## 2023-03-11 DIAGNOSIS — Z96619 Presence of unspecified artificial shoulder joint: Secondary | ICD-10-CM

## 2023-03-11 DIAGNOSIS — Z96612 Presence of left artificial shoulder joint: Secondary | ICD-10-CM | POA: Diagnosis not present

## 2023-03-11 DIAGNOSIS — Z87891 Personal history of nicotine dependence: Secondary | ICD-10-CM

## 2023-03-11 DIAGNOSIS — Z885 Allergy status to narcotic agent status: Secondary | ICD-10-CM | POA: Diagnosis not present

## 2023-03-11 DIAGNOSIS — S43005A Unspecified dislocation of left shoulder joint, initial encounter: Secondary | ICD-10-CM | POA: Diagnosis not present

## 2023-03-11 DIAGNOSIS — Z860101 Personal history of adenomatous and serrated colon polyps: Secondary | ICD-10-CM | POA: Diagnosis not present

## 2023-03-11 DIAGNOSIS — G4730 Sleep apnea, unspecified: Secondary | ICD-10-CM | POA: Diagnosis not present

## 2023-03-11 DIAGNOSIS — Z96653 Presence of artificial knee joint, bilateral: Secondary | ICD-10-CM | POA: Diagnosis not present

## 2023-03-11 DIAGNOSIS — Z8261 Family history of arthritis: Secondary | ICD-10-CM | POA: Diagnosis not present

## 2023-03-11 DIAGNOSIS — Z01818 Encounter for other preprocedural examination: Principal | ICD-10-CM

## 2023-03-11 DIAGNOSIS — T84098A Other mechanical complication of other internal joint prosthesis, initial encounter: Secondary | ICD-10-CM | POA: Diagnosis not present

## 2023-03-11 DIAGNOSIS — M25512 Pain in left shoulder: Secondary | ICD-10-CM | POA: Diagnosis present

## 2023-03-11 DIAGNOSIS — Z79899 Other long term (current) drug therapy: Secondary | ICD-10-CM

## 2023-03-11 HISTORY — PX: TOTAL SHOULDER REVISION: SHX6130

## 2023-03-11 LAB — COMPREHENSIVE METABOLIC PANEL
ALT: 76 U/L — ABNORMAL HIGH (ref 0–44)
AST: 44 U/L — ABNORMAL HIGH (ref 15–41)
Albumin: 4.2 g/dL (ref 3.5–5.0)
Alkaline Phosphatase: 65 U/L (ref 38–126)
Anion gap: 11 (ref 5–15)
BUN: 19 mg/dL (ref 8–23)
CO2: 23 mmol/L (ref 22–32)
Calcium: 9.7 mg/dL (ref 8.9–10.3)
Chloride: 102 mmol/L (ref 98–111)
Creatinine, Ser: 0.76 mg/dL (ref 0.61–1.24)
GFR, Estimated: >60 mL/min (ref >60)
Glucose, Bld: 122 mg/dL — ABNORMAL HIGH (ref 70–99)
Potassium: 3.6 mmol/L (ref 3.5–5.1)
Sodium: 136 mmol/L (ref 135–145)
Total Bilirubin: 1 mg/dL (ref 0.0–1.2)
Total Protein: 7.7 g/dL (ref 6.5–8.1)

## 2023-03-11 LAB — TYPE AND SCREEN
ABO/RH(D): AB POS
Antibody Screen: NEGATIVE

## 2023-03-11 LAB — CBC
HCT: 41.3 % (ref 39.0–52.0)
Hemoglobin: 13.9 g/dL (ref 13.0–17.0)
MCH: 30.2 pg (ref 26.0–34.0)
MCHC: 33.7 g/dL (ref 30.0–36.0)
MCV: 89.8 fL (ref 80.0–100.0)
Platelets: 206 10*3/uL (ref 150–400)
RBC: 4.6 MIL/uL (ref 4.22–5.81)
RDW: 12 % (ref 11.5–15.5)
WBC: 8.1 10*3/uL (ref 4.0–10.5)
nRBC: 0 % (ref 0.0–0.2)

## 2023-03-11 LAB — SURGICAL PCR SCREEN
MRSA, PCR: NEGATIVE
Staphylococcus aureus: NEGATIVE

## 2023-03-11 SURGERY — REVISION, TOTAL ARTHROPLASTY, SHOULDER
Anesthesia: General | Site: Shoulder | Laterality: Left

## 2023-03-11 MED ORDER — CHLORHEXIDINE GLUCONATE 0.12 % MT SOLN
15.0000 mL | Freq: Once | OROMUCOSAL | Status: AC
  Administered 2023-03-11: 15 mL via OROMUCOSAL

## 2023-03-11 MED ORDER — MIDAZOLAM HCL 2 MG/2ML IJ SOLN: 0.5000 mg | INTRAMUSCULAR | Status: DC

## 2023-03-11 MED ORDER — ROCURONIUM BROMIDE 10 MG/ML (PF) SYRINGE
PREFILLED_SYRINGE | INTRAVENOUS | Status: AC
  Filled 2023-03-11: qty 10

## 2023-03-11 MED ORDER — ROCURONIUM BROMIDE 10 MG/ML (PF) SYRINGE
PREFILLED_SYRINGE | INTRAVENOUS | Status: DC | PRN
  Administered 2023-03-11: 20 mg via INTRAVENOUS
  Administered 2023-03-11: 60 mg via INTRAVENOUS

## 2023-03-11 MED ORDER — DEXAMETHASONE SODIUM PHOSPHATE 10 MG/ML IJ SOLN
INTRAMUSCULAR | Status: AC
  Filled 2023-03-11: qty 1

## 2023-03-11 MED ORDER — BUPIVACAINE HCL (PF) 0.25 % IJ SOLN
INTRAMUSCULAR | Status: AC
  Filled 2023-03-11: qty 30

## 2023-03-11 MED ORDER — PROPOFOL 10 MG/ML IV BOLUS
INTRAVENOUS | Status: DC | PRN
  Administered 2023-03-11: 160 mg via INTRAVENOUS

## 2023-03-11 MED ORDER — FENTANYL CITRATE (PF) 100 MCG/2ML IJ SOLN
INTRAMUSCULAR | Status: AC
  Filled 2023-03-11: qty 2

## 2023-03-11 MED ORDER — OXYCODONE HCL 5 MG PO TABS
5.0000 mg | ORAL_TABLET | ORAL | Status: DC | PRN
  Administered 2023-03-11: 5 mg via ORAL

## 2023-03-11 MED ORDER — OXYCODONE HCL 5 MG PO TABS
ORAL_TABLET | ORAL | Status: AC
  Filled 2023-03-11: qty 1

## 2023-03-11 MED ORDER — KETAMINE HCL 50 MG/5ML IJ SOSY
PREFILLED_SYRINGE | INTRAMUSCULAR | Status: AC
  Filled 2023-03-11: qty 5

## 2023-03-11 MED ORDER — CEFAZOLIN SODIUM-DEXTROSE 2-4 GM/100ML-% IV SOLN
2.0000 g | Freq: Once | INTRAVENOUS | Status: AC
  Administered 2023-03-11: 2 g via INTRAVENOUS

## 2023-03-11 MED ORDER — FENTANYL CITRATE (PF) 100 MCG/2ML IJ SOLN
INTRAMUSCULAR | Status: DC | PRN
  Administered 2023-03-11: 50 ug via INTRAVENOUS
  Administered 2023-03-11: 100 ug via INTRAVENOUS
  Administered 2023-03-11: 50 ug via INTRAVENOUS

## 2023-03-11 MED ORDER — ONDANSETRON HCL 4 MG/2ML IJ SOLN
INTRAMUSCULAR | Status: DC | PRN
  Administered 2023-03-11: 4 mg via INTRAVENOUS

## 2023-03-11 MED ORDER — MIDAZOLAM HCL 2 MG/2ML IJ SOLN
INTRAMUSCULAR | Status: AC
  Filled 2023-03-11: qty 2

## 2023-03-11 MED ORDER — ONDANSETRON HCL 4 MG/2ML IJ SOLN
INTRAMUSCULAR | Status: AC
  Filled 2023-03-11: qty 2

## 2023-03-11 MED ORDER — DROPERIDOL 2.5 MG/ML IJ SOLN: 0.6250 mg | Freq: Once | INTRAMUSCULAR | Status: DC | PRN

## 2023-03-11 MED ORDER — ORAL CARE MOUTH RINSE: 15.0000 mL | Freq: Once | OROMUCOSAL | Status: AC

## 2023-03-11 MED ORDER — FENTANYL CITRATE PF 50 MCG/ML IJ SOSY
25.0000 ug | PREFILLED_SYRINGE | INTRAMUSCULAR | Status: DC
Start: 2023-03-11 — End: 2023-03-15

## 2023-03-11 MED ORDER — ACETAMINOPHEN 500 MG PO TABS
1000.0000 mg | ORAL_TABLET | Freq: Once | ORAL | Status: AC
  Administered 2023-03-11: 1000 mg via ORAL
  Filled 2023-03-11: qty 2

## 2023-03-11 MED ORDER — KETOROLAC TROMETHAMINE 30 MG/ML IJ SOLN
INTRAMUSCULAR | Status: AC
  Filled 2023-03-11: qty 1

## 2023-03-11 MED ORDER — SODIUM CHLORIDE 0.9 % IR SOLN
Status: DC | PRN
  Administered 2023-03-11: 1000 mL

## 2023-03-11 MED ORDER — FENTANYL CITRATE PF 50 MCG/ML IJ SOSY
PREFILLED_SYRINGE | INTRAMUSCULAR | Status: AC
  Filled 2023-03-11: qty 1

## 2023-03-11 MED ORDER — KETOROLAC TROMETHAMINE 30 MG/ML IJ SOLN
INTRAMUSCULAR | Status: DC | PRN
  Administered 2023-03-11: 30 mg via INTRAVENOUS

## 2023-03-11 MED ORDER — DEXAMETHASONE SODIUM PHOSPHATE 10 MG/ML IJ SOLN
INTRAMUSCULAR | Status: DC | PRN
  Administered 2023-03-11: 10 mg via INTRAVENOUS

## 2023-03-11 MED ORDER — LIDOCAINE HCL (PF) 2 % IJ SOLN
INTRAMUSCULAR | Status: AC
Start: 2023-03-11 — End: ?
  Filled 2023-03-11: qty 5

## 2023-03-11 MED ORDER — BUPIVACAINE HCL (PF) 0.25 % IJ SOLN
INTRAMUSCULAR | Status: DC | PRN
  Administered 2023-03-11: 30 mL

## 2023-03-11 MED ORDER — FENTANYL CITRATE PF 50 MCG/ML IJ SOSY: 25.0000 ug | PREFILLED_SYRINGE | INTRAMUSCULAR | Status: DC | PRN

## 2023-03-11 MED ORDER — HYDROMORPHONE HCL 2 MG/ML IJ SOLN
INTRAMUSCULAR | Status: AC
  Filled 2023-03-11: qty 1

## 2023-03-11 MED ORDER — SUGAMMADEX SODIUM 200 MG/2ML IV SOLN
INTRAVENOUS | Status: DC | PRN
  Administered 2023-03-11: 200 mg via INTRAVENOUS

## 2023-03-11 MED ORDER — LIDOCAINE 2% (20 MG/ML) 5 ML SYRINGE
INTRAMUSCULAR | Status: DC | PRN
  Administered 2023-03-11: 100 mg via INTRAVENOUS

## 2023-03-11 MED ORDER — CEFAZOLIN SODIUM-DEXTROSE 2-4 GM/100ML-% IV SOLN
INTRAVENOUS | Status: AC
  Filled 2023-03-11: qty 100

## 2023-03-11 MED ORDER — PROPOFOL 10 MG/ML IV BOLUS
INTRAVENOUS | Status: AC
  Filled 2023-03-11: qty 20

## 2023-03-11 MED ORDER — HYDROMORPHONE HCL 1 MG/ML IJ SOLN
INTRAMUSCULAR | Status: DC | PRN
  Administered 2023-03-11: .4 mg via INTRAVENOUS
  Administered 2023-03-11: .2 mg via INTRAVENOUS
  Administered 2023-03-11: .4 mg via INTRAVENOUS
  Administered 2023-03-11 (×5): .2 mg via INTRAVENOUS

## 2023-03-11 MED ORDER — 0.9 % SODIUM CHLORIDE (POUR BTL) OPTIME
TOPICAL | Status: DC | PRN
  Administered 2023-03-11: 1000 mL

## 2023-03-11 MED ORDER — OXYCODONE HCL 5 MG PO TABS: 5.0000 mg | ORAL_TABLET | ORAL | 0 refills | Status: DC | PRN

## 2023-03-11 MED ORDER — KETAMINE HCL 10 MG/ML IJ SOLN
INTRAMUSCULAR | Status: DC | PRN
  Administered 2023-03-11: 30 mg via INTRAVENOUS
  Administered 2023-03-11: 20 mg via INTRAVENOUS

## 2023-03-11 MED ORDER — LACTATED RINGERS IV SOLN: INTRAVENOUS | Status: DC | PRN

## 2023-03-11 MED ORDER — TIZANIDINE HCL 2 MG PO TABS: 2.0000 mg | ORAL_TABLET | Freq: Three times a day (TID) | ORAL | 0 refills | Status: DC | PRN

## 2023-03-11 SURGICAL SUPPLY — 46 items
BAG COUNTER SPONGE SURGICOUNT (BAG) IMPLANT
BAG ZIPLOCK 12X15 (MISCELLANEOUS) ×2 IMPLANT
BIT DRILL 4 DIA CALIBRATED (BIT) IMPLANT
COVER SURGICAL LIGHT HANDLE (MISCELLANEOUS) ×2 IMPLANT
DRAPE INCISE IOBAN 66X45 STRL (DRAPES) ×2 IMPLANT
DRAPE POUCH INSTRU U-SHP 10X18 (DRAPES) ×2 IMPLANT
DRAPE SURG 17X11 SM STRL (DRAPES) ×2 IMPLANT
DRAPE SURG ORHT 6 SPLT 77X108 (DRAPES) ×4 IMPLANT
DRAPE U-SHAPE 47X51 STRL (DRAPES) ×2 IMPLANT
DRSG AQUACEL AG ADV 3.5X10 (GAUZE/BANDAGES/DRESSINGS) ×2 IMPLANT
DURAPREP 26ML APPLICATOR (WOUND CARE) ×4 IMPLANT
ELECT BLADE TIP CTD 4 INCH (ELECTRODE) ×2 IMPLANT
ELECT REM PT RETURN 15FT ADLT (MISCELLANEOUS) ×2 IMPLANT
FACESHIELD WRAPAROUND (MASK) ×1
FACESHIELD WRAPAROUND OR TEAM (MASK) ×4 IMPLANT
GLOVE BIO SURGEON STRL SZ7 (GLOVE) ×2 IMPLANT
GLOVE BIO SURGEON STRL SZ7.5 (GLOVE) ×4 IMPLANT
GLOVE BIOGEL PI IND STRL 6.5 (GLOVE) ×2 IMPLANT
GLOVE BIOGEL PI IND STRL 7.0 (GLOVE) ×2 IMPLANT
GLOVE BIOGEL PI IND STRL 8 (GLOVE) ×2 IMPLANT
GLOVE SURG POLYISO LF SZ6.5 (GLOVE) ×2 IMPLANT
GOWN STRL REUS W/ TWL XL LVL3 (GOWN DISPOSABLE) ×2 IMPLANT
HEAD GLENOID W/SCREW 32MM (Shoulder) IMPLANT
HOOD PEEL AWAY T7 (MISCELLANEOUS) ×4 IMPLANT
INSERT EPOLY STND HUM SKT+4 36 (Shoulder) ×1 IMPLANT
INSERT EPOLYSTD HUM SKT+4 36 (Shoulder) IMPLANT
KIT BASIN OR (CUSTOM PROCEDURE TRAY) ×2 IMPLANT
KIT TURNOVER KIT A (KITS) IMPLANT
MANIFOLD NEPTUNE II (INSTRUMENTS) ×2 IMPLANT
NS IRRIG 1000ML POUR BTL (IV SOLUTION) ×2 IMPLANT
PACK SHOULDER (CUSTOM PROCEDURE TRAY) ×2 IMPLANT
PROTECTOR NERVE ULNAR (MISCELLANEOUS) ×2 IMPLANT
SET HNDPC FAN SPRY TIP SCT (DISPOSABLE) ×2 IMPLANT
SLING ARM IMMOBILIZER LRG (SOFTGOODS) ×2 IMPLANT
SPONGE T-LAP 4X18 ~~LOC~~+RFID (SPONGE) ×4 IMPLANT
STRIP CLOSURE SKIN 1/2X4 (GAUZE/BANDAGES/DRESSINGS) ×4 IMPLANT
SUCTION TUBE FRAZIER 12FR DISP (SUCTIONS) ×2 IMPLANT
SUT ETHIBOND 2 V 37 (SUTURE) IMPLANT
SUT MNCRL AB 4-0 PS2 18 (SUTURE) ×2 IMPLANT
SUT VIC AB 0 CT1 36 (SUTURE) ×2 IMPLANT
SUT VIC AB 2-0 CT1 TAPERPNT 27 (SUTURE) ×2 IMPLANT
SWAB COLLECTION DEVICE MRSA (MISCELLANEOUS) IMPLANT
SWAB CULTURE ESWAB REG 1ML (MISCELLANEOUS) IMPLANT
TAP SURG THRD DJ 6.5 (ORTHOPEDIC DISPOSABLE SUPPLIES) IMPLANT
TOWEL OR 17X26 10 PK STRL BLUE (TOWEL DISPOSABLE) ×2 IMPLANT
WATER STERILE IRR 1000ML POUR (IV SOLUTION) ×2 IMPLANT

## 2023-03-11 NOTE — Op Note (Signed)
 Procedure(s): LEFT SHOULDER REVISION   DANFORD TAT male 68 y.o. 03/11/2023  Preoperative diagnosis: Failure of left shoulder glenoid component, reverse total shoulder arthroplasty  Postoperative diagnosis: Same  Procedure performed: Left shoulder revision reverse total shoulder arthroplasty including replacement and reimplantation of glenosphere and replacement of humeral sided polyethylene component.  Indications:  68 y.o. male status post left shoulder reverse total shoulder arthroplasty nearly 3 months ago with dissociation of the glenosphere from the glenoid baseplate approximately 3 weeks ago.  Indicated for surgical treatment to restore the mechanical stability of the implant.     Surgeon: Josefa LELON Herring   Assistants: Jeoffrey Northern PA-C Amber was present and scrubbed throughout the procedure and was essential in positioning, retraction, exposure, and closure)  Anesthesia: General endotracheal anesthesia    Procedure Detail  LEFT SHOULDER REVISION  Estimated Blood Loss:  less than 100 mL         Drains: none  Blood Given: none          Specimens: none        Complications:  (1) Difficult to intubate - expected  Comments: Filed from anesthesia note documentation.         Disposition: PACU - hemodynamically stable.         Condition: stable      OPERATIVE FINDINGS:  The displaced glenosphere component was removed.  The glenoid baseplate was intact and well-fixed.  After cleaning the periphery of the baseplate well the new 36 sphere was impacted with excellent fixation.  The humeral polyethylene component had to be replaced.  PROCEDURE: The patient was identified in the preoperative holding area  where I personally marked the operative site after verifying site, side,  and procedure with the patient. An interscalene block given by  the attending anesthesiologist in the holding area and the patient was taken back to the operating room where all extremities  were  carefully padded in position after general anesthesia was induced. She  was placed in a beach-chair position and the operative upper extremity was  prepped and draped in a standard sterile fashion. An approximately 10-  cm incision was made from the tip of the coracoid process to the center  point of the humerus at the level of the axilla. Dissection was carried  down through subcutaneous tissues to the previous scar plane.  The deltoid was taken laterally with the pectoralis major taken medially.  The cephalic vein was not identified.  The undersurface of the deltoid plane was identified and developed.  The implant was identified and after freeing up extensive scar tissue I was able to dislocate the implant.  The polyethylene component was then removed from the humeral side.  The humeral side was well-fixed.  The glenosphere was then exposed and removed without difficulty.  The glenoid baseplate was then exposed.  There was noted to be some prominence of bone and inferior aspect of the glenoid baseplate rim.  After using a curette and rongeurs around the edges I used the peripheral reamer on power to ream down to a concentric flat surface around the baseplate.  After extensive irrigation here a new 36 standard glenosphere was placed and impacted.  After impaction and before screw placement the Morse taper fixation was checked and I was able to pull my full force on the implant and rotate with full force with no movement at all.  At this point the sphere was further impacted with the impactor and the central screw was placed with satisfactory  fixation.  At this point the trial polyethylene components were again used and the 36+4 retentive polyethylene was felt to be the best stability and soft tissue tension.  Therefore the +4 retentive polyethylene was impacted and the joint was reduced and again taken through range of motion and felt to be stable with appropriate soft tissue tension.  Copious  irrigation was used the pulse irrigator.  The wound was then closed in layers with 2-0 Vicryl and 4-0 Monocryl for skin closure.  Steri-Strips were applied as well as a waterproof dressing.  The wound was infiltrated with approximately 10 cc of quarter percent Marcaine  without epinephrine .  Postoperative plan: The patient will be observed in the recovery room and if his pain is well-controlled and he is hemodynamically stable he can be discharged home today with his family.

## 2023-03-11 NOTE — Anesthesia Preprocedure Evaluation (Addendum)
 Anesthesia Evaluation  Patient identified by MRN, date of birth, ID band Patient awake    Reviewed: Allergy & Precautions, NPO status , Patient's Chart, lab work & pertinent test results  Airway Mallampati: III  TM Distance: >3 FB Neck ROM: Limited    Dental  (+) Dental Advisory Given, Teeth Intact   Pulmonary sleep apnea , pneumonia, former smoker   Pulmonary exam normal breath sounds clear to auscultation       Cardiovascular hypertension, Pt. on home beta blockers and Pt. on medications  Rhythm:Regular Rate:Normal     Neuro/Psych negative neurological ROS     GI/Hepatic Neg liver ROS,GERD  ,,  Endo/Other  negative endocrine ROS    Renal/GU negative Renal ROS     Musculoskeletal  (+) Arthritis ,    Abdominal   Peds  Hematology negative hematology ROS (+)   Anesthesia Other Findings   Reproductive/Obstetrics                             Anesthesia Physical Anesthesia Plan  ASA: 3  Anesthesia Plan: General   Post-op Pain Management: Tylenol  PO (pre-op)*, Toradol  IV (intra-op)* and Precedex   Induction: Intravenous  PONV Risk Score and Plan: 2 and Ondansetron , Dexamethasone , Treatment may vary due to age or medical condition and Midazolam   Airway Management Planned: Oral ETT and Video Laryngoscope Planned  Additional Equipment:   Intra-op Plan:   Post-operative Plan: Extubation in OR  Informed Consent: I have reviewed the patients History and Physical, chart, labs and discussed the procedure including the risks, benefits and alternatives for the proposed anesthesia with the patient or authorized representative who has indicated his/her understanding and acceptance.     Dental advisory given  Plan Discussed with: CRNA  Anesthesia Plan Comments: (I had a long discussion with Nathan Stewart about doing a nerve block for his surgery today. 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 He's not had any other workup for this whether it's residual cervical compression or potential nerve injury from his prior interscalene block from his surgery in October. His symptoms appear to be improving in all other locations (gait, RUE, etc). I discussed this with Dr. Dozier.)       Anesthesia Quick Evaluation

## 2023-03-11 NOTE — OR Nursing (Signed)
 Components explanted from left shoulder by Ave Filter, MD

## 2023-03-11 NOTE — Anesthesia Procedure Notes (Signed)
 Procedure Name: Intubation Date/Time: 03/11/2023 1:00 PM  Performed by: Zulema Leita PARAS, CRNAPre-anesthesia Checklist: Patient identified, Emergency Drugs available, Suction available and Patient being monitored Patient Re-evaluated:Patient Re-evaluated prior to induction Oxygen Delivery Method: Circle system utilized Preoxygenation: Pre-oxygenation with 100% oxygen Induction Type: IV induction Ventilation: Mask ventilation without difficulty Laryngoscope Size: Glidescope and 4 Grade View: Grade I Tube type: Oral Tube size: 7.5 mm Number of attempts: 1 Airway Equipment and Method: Stylet Placement Confirmation: ETT inserted through vocal cords under direct vision, positive ETCO2 and breath sounds checked- equal and bilateral Secured at: 21 cm Tube secured with: Tape Dental Injury: Teeth and Oropharynx as per pre-operative assessment  Difficulty Due To: Difficulty was anticipated Comments: Due to prior intubation note and use of Glidescope in past, choice to utilize Glidescope for intubation. Easy mask, Grade 1 view and ETT gently placed. BBS=, EETCO2 +.

## 2023-03-11 NOTE — Transfer of Care (Signed)
 Immediate Anesthesia Transfer of Care Note  Patient: Nathan Stewart  Procedure(s) Performed: LEFT SHOULDER REVISION GLENOID COMPONENT (Left: Shoulder)  Patient Location: PACU  Anesthesia Type:General  Level of Consciousness: awake, alert , and oriented  Airway & Oxygen Therapy: Patient Spontanous Breathing and Patient connected to face mask oxygen  Post-op Assessment: Report given to RN and Post -op Vital signs reviewed and stable  Post vital signs: Reviewed and stable  Last Vitals:  Vitals Value Taken Time  BP 143/98 03/11/23 1505  Temp    Pulse 77 03/11/23 1507  Resp 12 03/11/23 1507  SpO2 99 % 03/11/23 1507  Vitals shown include unfiled device data.  Last Pain:  Vitals:   03/11/23 1113  TempSrc: Temporal  PainSc:          Complications:  Encounter Notable Events  Notable Event Outcome Phase Comment  Difficult to intubate - expected  Intraprocedure Filed from anesthesia note documentation.

## 2023-03-11 NOTE — Discharge Instructions (Addendum)
Discharge Instructions after Reverse Total Shoulder Arthroplasty   . A sling has been provided for you. You are to wear this at all times (except for bathing and dressing), until your first post operative visit with Dr. Chandler. Please also wear while sleeping at night. While you bath and dress, let the arm/elbow extend straight down to stretch your elbow. Wiggle your fingers and pump your first while your in the sling to prevent hand swelling. . Use ice on the shoulder intermittently over the first 48 hours after surgery. Continue to use ice or and ice machine as needed after 48 hours for pain control/swelling.  . Pain medicine has been prescribed for you.  . Use your medicine liberally over the first 48 hours, and then you can begin to taper your use. You may take Extra Strength Tylenol or Tylenol only in place of the pain pills. DO NOT take ANY nonsteroidal anti-inflammatory pain medications: Advil, Motrin, Ibuprofen, Aleve, Naproxen or Naprosyn.  . Take one aspirin a day for 2 weeks after surgery, unless you have an aspirin sensitivity/allergy or asthma.  . Leave your dressing on until your first follow up visit.  You may shower with the dressing.  Hold your arm as if you still have your sling on while you shower. . Simply allow the water to wash over the site and then pat dry. Make sure your axilla (armpit) is completely dry after showering.    Please call [phone] during normal business hours or [phone] after hours for any problems. Including the following:  - excessive redness of the incisions - drainage for more than 4 days - fever of more than 101.5 F  *Please note that pain medications will not be refilled after hours or on weekends.     

## 2023-03-11 NOTE — H&P (Signed)
 Nathan Stewart is an 68 y.o. male.   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   GEN OSTEOARTHROSIS INVOLVING MULTIPLE SITES 11/10/2007   GERD 01/01/2007   Hx of adenomatous polyp of colon 04/04/2016   Hypertension    INSOMNIA, CHRONIC 11/13/2009   LIVER FUNCTION TESTS, ABNORMAL 08/27/2007   LOC OSTEOARTHROS NOT SPEC PRIM/SEC LOWER LEG 11/10/2007   Memory loss 08/27/2007   MENISCUS TEAR 10/30/2006   OSTEOARTHRITIS 06/07/2008   Pneumonia    SLEEP APNEA 09/20/2009   TESTICULAR HYPOFUNCTION 08/14/2009    Past Surgical History:  Procedure Laterality Date   COLONOSCOPY     HAND SURGERY  over 20 years ago   'metal plate in wrist'   JOINT REPLACEMENT N/A    [employer]reesia 10/19/2019   MULTIPLE TOOTH EXTRACTIONS     SHOULDER SURGERY Right 2021   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   Procedure: TOTAL KNEE ARTHROPLASTY;  Surgeon: Dempsey Sensor, MD;  Location: [facility];  Service: Orthopedics;  Laterality: Left;   TOTAL KNEE ARTHROPLASTY Right 05/21/2021   Procedure: RIGHT TOTAL KNEE ARTHROPLASTY;  Surgeon: Sensor Dempsey, MD;  Location: [facility];  Service: Orthopedics;  Laterality: Right;    Family History  Problem Relation Age of Onset   Arthritis Mother    Heart disease Mother    Hypertension Mother    Hypertension Father    Stroke Father    Hypertension Sister    Stroke Brother    Colon cancer Paternal Uncle    Social History:  reports that he quit smoking about 26 years ago. His smoking use included cigarettes. He has been exposed to tobacco smoke. He has never used smokeless tobacco. He reports current alcohol use of about 6.0 standard drinks of alcohol per week. He reports that he does not use drugs.  Allergies:  Allergies  Allergen Reactions   Hydrocodone -Acetaminophen  Nausea And Vomiting   Irbesartan      Tingling,  swelling  Pt unaware of allergy reaction    Percocet [Oxycodone -Acetaminophen ] Nausea And Vomiting   Shingrix [Zoster Vac Recomb Adjuvanted] Itching    Facility-Administered Medications Prior to Admission  Medication Dose Route Frequency Provider Last Rate Last Admin   0.9 %  sodium chloride  infusion  500 mL Intravenous Continuous Avram Lupita BRAVO, MD       Medications Prior to Admission  Medication Sig Dispense Refill   ALPRAZolam  (XANAX ) 0.5 MG tablet Take 1 tablet (0.5 mg total) by mouth 3 (three) times daily as needed for anxiety. (Patient taking differently: Take 0.5 mg by mouth at bedtime.) 30 tablet 2   amLODipine  (NORVASC ) 5 MG tablet TAKE 1 TABLET(5 MG) BY MOUTH DAILY 90 tablet 3   ELDERBERRY PO Take 2,000 Units by mouth daily.     Garlic 1000 MG CAPS Take 1,000 mg by mouth daily.     hydrochlorothiazide  (HYDRODIURIL ) 25 MG tablet TAKE 1 TABLET(25 MG) BY MOUTH DAILY 90 tablet 1   Magnesium 250 MG TABS Take 250 mg by mouth daily.     metoprolol  succinate (TOPROL -XL) 100 MG 24 hr tablet TAKE 1 TABLET BY MOUTH EVERY DAY WITH OR IMMEDIATELY FOLLOWING A MEAL 90 tablet 1   Multiple Vitamin (MULTIVITAMIN) tablet Take 1 tablet by mouth daily. Adult 50+     Omega-3 Fatty Acids (FISH OIL) 1000 MG CPDR Take 2,000  mg by mouth daily.     vitamin C (ASCORBIC ACID) 250 MG tablet Take 250 mg by mouth daily.      No results found for this or any previous visit (from the past 48 hours). No results found.  Review of Systems  All other systems reviewed and are negative.   Blood pressure (!) 148/86, pulse 78, temperature 98.1 F (36.7 C), temperature source Temporal, resp. rate 16, height 6' 2 (1.88 m), weight 103.4 kg, SpO2 96%. Physical Exam Constitutional:      Appearance: He is well-developed.  HENT:     Head: Atraumatic.  Eyes:     Extraocular Movements: Extraocular movements intact.  Cardiovascular:     Pulses: Normal pulses.  Pulmonary:     Effort: Pulmonary effort is normal.   Musculoskeletal:     Comments: Pain with L shoulder ROM.  Skin:    General: Skin is warm and dry.  Neurological:     Mental Status: He is alert and oriented to person, place, and time.  Psychiatric:        Mood and Affect: Mood normal.      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 Plan revision L reverse TSA Risks / benefits of surgery discussed Consent on chart  NPO for OR Preop antibiotics   Josefa LELON Herring, MD 03/11/2023, 12:19 PM

## 2023-03-11 NOTE — Anesthesia Postprocedure Evaluation (Signed)
 Anesthesia Post Note  Patient: Nathan Stewart  Procedure(s) Performed: LEFT SHOULDER REVISION GLENOID COMPONENT (Left: Shoulder)     Patient location during evaluation: PACU Anesthesia Type: General Level of consciousness: awake and alert Pain management: pain level controlled Vital Signs Assessment: post-procedure vital signs reviewed and stable Respiratory status: spontaneous breathing, nonlabored ventilation, respiratory function stable and patient connected to nasal cannula oxygen Cardiovascular status: blood pressure returned to baseline and stable Postop Assessment: no apparent nausea or vomiting Anesthetic complications: yes   Encounter Notable Events  Notable Event Outcome Phase Comment  Difficult to intubate - expected  Intraprocedure Filed from anesthesia note documentation.    Last Vitals:  Vitals:   03/11/23 1745 03/11/23 1902  BP: (!) 141/93 (!) 131/96  Pulse: 81 81  Resp: 18   Temp: (!) 36.4 C   SpO2: 93% 92%    Last Pain:  Vitals:   03/11/23 1902  TempSrc:   PainSc: 0-No pain                 Nathan Stewart

## 2023-03-12 ENCOUNTER — Encounter (HOSPITAL_COMMUNITY): Payer: Self-pay | Admitting: Orthopedic Surgery

## 2023-03-13 DIAGNOSIS — Z96619 Presence of unspecified artificial shoulder joint: Secondary | ICD-10-CM

## 2023-03-14 DIAGNOSIS — M25512 Pain in left shoulder: Secondary | ICD-10-CM | POA: Diagnosis not present

## 2023-03-17 ENCOUNTER — Telehealth (INDEPENDENT_AMBULATORY_CARE_PROVIDER_SITE_OTHER): Payer: Self-pay | Admitting: Gastroenterology

## 2023-03-17 DIAGNOSIS — Z860101 Personal history of adenomatous and serrated colon polyps: Secondary | ICD-10-CM

## 2023-03-17 NOTE — Telephone Encounter (Signed)
Room 1 , 2 day prep Thanks 

## 2023-03-17 NOTE — Telephone Encounter (Signed)
Who is your primary care physician: Lynnea Ferrier   Reasons for the colonoscopy: 7 year recall  Have you had a colonoscopy before?  Yes I think 7 years ago  Do you have family history of colon cancer? no  Previous colonoscopy with polyps removed? Yes 1 years ago  Do you have a history colorectal cancer?   no  Are you diabetic? If yes, Type 1 or Type 2?    no  Do you have a prosthetic or mechanical heart valve? no  Do you have a pacemaker/defibrillator?   no  Have you had endocarditis/atrial fibrillation? no  Have you had joint replacement within the last 12 months?  yes  Do you tend to be constipated or have to use laxatives? no  Do you have any history of drugs or alchohol?  no  Do you use supplemental oxygen?  no  Have you had a stroke or heart attack within the last 6 months? no  Do you take weight loss medication?  no  Do you take any blood-thinning medications such as: (aspirin, warfarin, Plavix, Aggrenox)  no  If yes we need the name, milligram, dosage and who is prescribing doctor  Current Outpatient Medications on File Prior to Visit  Medication Sig Dispense Refill   ALPRAZolam (XANAX) 0.5 MG tablet Take 1 tablet (0.5 mg total) by mouth 3 (three) times daily as needed for anxiety. (Patient taking differently: Take 0.5 mg by mouth at bedtime.) 30 tablet 2   amLODipine (NORVASC) 5 MG tablet TAKE 1 TABLET(5 MG) BY MOUTH DAILY 90 tablet 3   Garlic 1000 MG CAPS Take 1,000 mg by mouth daily.     hydrochlorothiazide (HYDRODIURIL) 25 MG tablet TAKE 1 TABLET(25 MG) BY MOUTH DAILY 90 tablet 1   Magnesium 250 MG TABS Take 250 mg by mouth daily.     metoprolol succinate (TOPROL-XL) 100 MG 24 hr tablet TAKE 1 TABLET BY MOUTH EVERY DAY WITH OR IMMEDIATELY FOLLOWING A MEAL 90 tablet 1   Multiple Vitamin (MULTIVITAMIN) tablet Take 1 tablet by mouth daily. Adult 50+     Omega-3 Fatty Acids (FISH OIL) 1000 MG CPDR Take 2,000 mg by mouth daily.     vitamin C (ASCORBIC ACID) 250 MG  tablet Take 250 mg by mouth daily.     ELDERBERRY PO Take 2,000 Units by mouth daily. (Patient not taking: Reported on 03/17/2023)     oxyCODONE (ROXICODONE) 5 MG immediate release tablet Take 1 tablet (5 mg total) by mouth every 4 (four) hours as needed for severe pain (pain score 7-10). (Patient not taking: Reported on 03/17/2023) 30 tablet 0   tiZANidine (ZANAFLEX) 2 MG tablet Take 1-2 tablets (2-4 mg total) by mouth every 8 (eight) hours as needed for muscle spasms. (Patient not taking: Reported on 03/17/2023) 40 tablet 0   Current Facility-Administered Medications on File Prior to Visit  Medication Dose Route Frequency Provider Last Rate Last Admin   0.9 %  sodium chloride infusion  500 mL Intravenous Continuous Iva Boop, MD        Allergies  Allergen Reactions   Hydrocodone-Acetaminophen Nausea And Vomiting   Irbesartan     Tingling, swelling  Pt unaware of allergy reaction    Percocet [Oxycodone-Acetaminophen] Nausea And Vomiting   Shingrix [Zoster Vac Recomb Adjuvanted] Itching     Pharmacy: [facility] Insurance Name: [employer]  Best number where you can be reached: [phone]

## 2023-03-20 NOTE — Discharge Summary (Signed)
Patient ID: Nathan Stewart MRN: [id number] DOB/AGE: [birth year] 68 y.o.  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o. male who was admitted 03/11/2023 for operative treatment ofHistory of revision of total shoulder arthroplasty. Patient has severe unremitting pain that affects sleep, daily activities, and work/hobbies. After pre-op clearance the patient was taken to the operating room on 03/11/2023 and underwent  Procedure(s): LEFT SHOULDER REVISION GLENOID COMPONENT.    Patient was given perioperative antibiotics:  Anti-infectives (From admission, onward)    Start     Dose/Rate Route Frequency Ordered Stop   03/11/23 1315  ceFAZolin (ANCEF) IVPB 2g/100 mL premix        2 g 200 mL/hr over 30 Minutes Intravenous  Once 03/11/23 1307 03/11/23 1303   03/11/23 1246  ceFAZolin (ANCEF) 2-4 GM/100ML-% IVPB       Note to Pharmacy: Maricela Curet J: cabinet override      03/11/23 1246 03/11/23 1309        Patient was given sequential compression devices, early ambulation, and chemoprophylaxis to prevent DVT.  He had some bleeding from the incision in PACU. Dr Ave Filter  came and placed about 6 staples in the distal aspect of the incision and placed a bulky, padded dressing over the incision.      Discharge Medications:   Allergies as of 03/14/2023       Reactions   Hydrocodone-acetaminophen Nausea And Vomiting   Irbesartan    Tingling, swelling Pt unaware of allergy reaction    Percocet [oxycodone-acetaminophen] Nausea And Vomiting   Shingrix [zoster Vac Recomb Adjuvanted] Itching        Medication List     TAKE these medications    ALPRAZolam 0.5 MG tablet Commonly known as: XANAX Take 1 tablet (0.5 mg total) by mouth 3 (three) times daily as needed for anxiety. What changed: when to take this   amLODipine 5 MG tablet Commonly known as: NORVASC TAKE 1 TABLET(5 MG) BY MOUTH DAILY   ELDERBERRY PO Take 2,000 Units by mouth daily.   Fish Oil 1000 MG Cpdr Take 2,000 mg by mouth daily.   Garlic 1000 MG Caps Take 1,000 mg by mouth daily.   hydrochlorothiazide 25 MG tablet Commonly known as: HYDRODIURIL TAKE 1 TABLET(25 MG) BY MOUTH DAILY   Magnesium 250 MG Tabs Take 250 mg by mouth daily.   metoprolol succinate 100 MG 24 hr tablet Commonly known as: TOPROL-XL TAKE 1 TABLET BY MOUTH EVERY DAY WITH OR IMMEDIATELY FOLLOWING A MEAL   multivitamin tablet Take 1 tablet by mouth daily. Adult 50+   oxyCODONE 5 MG immediate release tablet Commonly known as: Roxicodone Take 1 tablet (5 mg total)  by mouth every 4 (four) hours as needed for severe pain (pain score 7-10).   tiZANidine 2 MG tablet Commonly known as: ZANAFLEX Take 1-2 tablets (2-4 mg total) by mouth every 8 (eight) hours as needed for muscle spasms.   vitamin C 250 MG tablet Commonly known as: ASCORBIC ACID Take 250 mg by mouth daily.        Diagnostic Studies: No results found.  Disposition: Discharge disposition: 01-Home or Self Care       Discharge Instructions     Call MD / Call 911   Complete by: As directed    If you experience chest pain or  shortness of breath, CALL 911 and be transported to the hospital emergency room.  If you develope a fever above 101 F, pus (white drainage) or increased drainage or redness at the wound, or calf pain, call your surgeon's office.   Constipation Prevention   Complete by: As directed    Drink plenty of fluids.  Prune juice may be helpful.  You may use a stool softener, such as Colace (over the counter) 100 mg twice a day.  Use MiraLax (over the counter) for constipation as needed.        Follow-up Information     Jones Broom, MD. Schedule an appointment as soon as possible for a visit on 03/24/2023.   Specialty: Orthopedic Surgery Contact information: [address] [place] [address] [phone]                  Signed: Joice Lofts L. [place], PA-C 03/20/2023, 7:35 AM

## 2023-03-24 ENCOUNTER — Other Ambulatory Visit: Payer: Self-pay | Admitting: Family Medicine

## 2023-03-26 ENCOUNTER — Other Ambulatory Visit: Payer: Self-pay | Admitting: Family Medicine

## 2023-03-26 DIAGNOSIS — M25512 Pain in left shoulder: Secondary | ICD-10-CM | POA: Diagnosis not present

## 2023-03-26 NOTE — Telephone Encounter (Signed)
Left message to return call

## 2023-03-27 MED ORDER — PEG 3350-KCL-NA BICARB-NACL 420 G PO SOLR: 4000.0000 mL | Freq: Once | ORAL | 0 refills | Status: AC

## 2023-03-27 NOTE — Telephone Encounter (Signed)
Questionnaire from recall, no referral needed

## 2023-03-27 NOTE — Telephone Encounter (Signed)
Pt returned call. Pt scheduled for 05/09/23. Instructions will be mailed to pt. No pa needed. Prep sent to pharmacy.

## 2023-03-27 NOTE — Addendum Note (Signed)
Addended by: Marlowe Shores on: 03/27/2023 03:29 PM   Modules accepted: Orders

## 2023-03-31 DIAGNOSIS — Z96612 Presence of left artificial shoulder joint: Secondary | ICD-10-CM | POA: Diagnosis not present

## 2023-03-31 DIAGNOSIS — R531 Weakness: Secondary | ICD-10-CM | POA: Diagnosis not present

## 2023-03-31 DIAGNOSIS — M25612 Stiffness of left shoulder, not elsewhere classified: Secondary | ICD-10-CM | POA: Diagnosis not present

## 2023-04-02 ENCOUNTER — Other Ambulatory Visit: Payer: Self-pay | Admitting: Orthopedic Surgery

## 2023-04-02 ENCOUNTER — Encounter (HOSPITAL_COMMUNITY): Payer: Self-pay | Admitting: Orthopedic Surgery

## 2023-04-02 ENCOUNTER — Other Ambulatory Visit: Payer: Self-pay

## 2023-04-02 DIAGNOSIS — M25512 Pain in left shoulder: Secondary | ICD-10-CM | POA: Diagnosis not present

## 2023-04-02 NOTE — Progress Notes (Addendum)
 PCP - Butler Pickard,MD  Cardiologist - no  PPM/ICD -  Device Orders -  Rep Notified -   Chest x-ray -  EKG - 03-11-23 epic Stress Test -  ECHO -  Cardiac Cath -   Sleep Study -  CPAP -   Fasting Blood Sugar -  Checks Blood Sugar _____ times a day  Blood Thinner Instructions: Aspirin  Instructions:  ERAS Protcol - PRE-SURGERY   COVID vaccine -yes  Activity--Able to climb a flight of stairs with no cp or sob Anesthesia review: HTN , difficult intubation noted in chart. Pt. Unaware of any difficult intubation.Burnard Senna PA-C aware   Patient denies shortness of breath, fever, cough and chest pain at PAT appointment   All instructions explained to the patient, with a verbal understanding of the material. Patient agrees to go over the instructions while at home for a better understanding. Patient also instructed to self quarantine after being tested for COVID-19. The opportunity to ask questions was provided.

## 2023-04-03 ENCOUNTER — Inpatient Hospital Stay (HOSPITAL_COMMUNITY)
Admission: RE | Admit: 2023-04-03 | Discharge: 2023-04-07 | DRG: 483 | Disposition: A | Discharge status: Home Health | Payer: Medicare Other | Attending: Orthopedic Surgery | Admitting: Orthopedic Surgery

## 2023-04-03 ENCOUNTER — Inpatient Hospital Stay (HOSPITAL_COMMUNITY): Payer: Medicare Other | Admitting: Medical

## 2023-04-03 ENCOUNTER — Encounter (HOSPITAL_COMMUNITY)
Admission: RE | Disposition: A | Discharge status: Home Health | Payer: Self-pay | Source: Home / Self Care | Attending: Orthopedic Surgery

## 2023-04-03 ENCOUNTER — Encounter (HOSPITAL_COMMUNITY): Payer: Self-pay | Admitting: Orthopedic Surgery

## 2023-04-03 ENCOUNTER — Other Ambulatory Visit: Payer: Self-pay

## 2023-04-03 DIAGNOSIS — Z87891 Personal history of nicotine dependence: Secondary | ICD-10-CM | POA: Diagnosis not present

## 2023-04-03 DIAGNOSIS — G4733 Obstructive sleep apnea (adult) (pediatric): Secondary | ICD-10-CM

## 2023-04-03 DIAGNOSIS — Z01818 Encounter for other preprocedural examination: Secondary | ICD-10-CM

## 2023-04-03 DIAGNOSIS — Z888 Allergy status to other drugs, medicaments and biological substances status: Secondary | ICD-10-CM

## 2023-04-03 DIAGNOSIS — I10 Essential (primary) hypertension: Secondary | ICD-10-CM | POA: Diagnosis not present

## 2023-04-03 DIAGNOSIS — Z885 Allergy status to narcotic agent status: Secondary | ICD-10-CM | POA: Diagnosis not present

## 2023-04-03 DIAGNOSIS — Z96653 Presence of artificial knee joint, bilateral: Secondary | ICD-10-CM | POA: Diagnosis present

## 2023-04-03 DIAGNOSIS — Z860101 Personal history of adenomatous and serrated colon polyps: Secondary | ICD-10-CM | POA: Diagnosis not present

## 2023-04-03 DIAGNOSIS — B9561 Methicillin susceptible Staphylococcus aureus infection as the cause of diseases classified elsewhere: Secondary | ICD-10-CM | POA: Diagnosis present

## 2023-04-03 DIAGNOSIS — Z887 Allergy status to serum and vaccine status: Secondary | ICD-10-CM | POA: Diagnosis not present

## 2023-04-03 DIAGNOSIS — G4700 Insomnia, unspecified: Secondary | ICD-10-CM | POA: Diagnosis not present

## 2023-04-03 DIAGNOSIS — Z96619 Presence of unspecified artificial shoulder joint: Secondary | ICD-10-CM

## 2023-04-03 DIAGNOSIS — Z96612 Presence of left artificial shoulder joint: Secondary | ICD-10-CM | POA: Diagnosis not present

## 2023-04-03 DIAGNOSIS — Z8249 Family history of ischemic heart disease and other diseases of the circulatory system: Secondary | ICD-10-CM | POA: Diagnosis not present

## 2023-04-03 DIAGNOSIS — Z1211 Encounter for screening for malignant neoplasm of colon: Secondary | ICD-10-CM

## 2023-04-03 DIAGNOSIS — Z79899 Other long term (current) drug therapy: Secondary | ICD-10-CM | POA: Diagnosis not present

## 2023-04-03 DIAGNOSIS — T8459XA Infection and inflammatory reaction due to other internal joint prosthesis, initial encounter: Principal | ICD-10-CM | POA: Diagnosis present

## 2023-04-03 DIAGNOSIS — Y838 Other surgical procedures as the cause of abnormal reaction of the patient, or of later complication, without mention of misadventure at the time of the procedure: Secondary | ICD-10-CM | POA: Diagnosis present

## 2023-04-03 DIAGNOSIS — Z96611 Presence of right artificial shoulder joint: Secondary | ICD-10-CM | POA: Diagnosis not present

## 2023-04-03 HISTORY — PX: TOTAL SHOULDER REVISION: SHX6130

## 2023-04-03 HISTORY — PX: IRRIGATION AND DEBRIDEMENT SHOULDER: SHX5880

## 2023-04-03 LAB — BASIC METABOLIC PANEL
Anion gap: 13 (ref 5–15)
BUN: 13 mg/dL (ref 8–23)
CO2: 22 mmol/L (ref 22–32)
Calcium: 9.6 mg/dL (ref 8.9–10.3)
Chloride: 99 mmol/L (ref 98–111)
Creatinine, Ser: 0.87 mg/dL (ref 0.61–1.24)
GFR, Estimated: >60 mL/min (ref >60)
Glucose, Bld: 167 mg/dL — ABNORMAL HIGH (ref 70–99)
Potassium: 3.8 mmol/L (ref 3.5–5.1)
Sodium: 134 mmol/L — ABNORMAL LOW (ref 135–145)

## 2023-04-03 LAB — CBC
HCT: 40.6 % (ref 39.0–52.0)
Hemoglobin: 12.7 g/dL — ABNORMAL LOW (ref 13.0–17.0)
MCH: 29.1 pg (ref 26.0–34.0)
MCHC: 31.3 g/dL (ref 30.0–36.0)
MCV: 92.9 fL (ref 80.0–100.0)
Platelets: 215 10*3/uL (ref 150–400)
RBC: 4.37 MIL/uL (ref 4.22–5.81)
RDW: 11.8 % (ref 11.5–15.5)
WBC: 10.9 10*3/uL — ABNORMAL HIGH (ref 4.0–10.5)
nRBC: 0 % (ref 0.0–0.2)

## 2023-04-03 LAB — SEDIMENTATION RATE: Sed Rate: 58 mm/h — ABNORMAL HIGH (ref 0–16)

## 2023-04-03 LAB — C-REACTIVE PROTEIN: CRP: 7.6 mg/dL — ABNORMAL HIGH (ref <1.0)

## 2023-04-03 SURGERY — IRRIGATION AND DEBRIDEMENT SHOULDER
Anesthesia: General | Site: Shoulder | Laterality: Left

## 2023-04-03 MED ORDER — METOPROLOL SUCCINATE ER 50 MG PO TB24
100.0000 mg | ORAL_TABLET | Freq: Every day | ORAL | Status: DC
Start: 2023-04-04 — End: 2023-04-07
  Administered 2023-04-04 – 2023-04-07 (×4): 100 mg via ORAL
  Filled 2023-04-03 (×4): qty 2

## 2023-04-03 MED ORDER — DEXAMETHASONE SODIUM PHOSPHATE 10 MG/ML IJ SOLN
INTRAMUSCULAR | Status: DC | PRN
  Administered 2023-04-03: 8 mg via INTRAVENOUS

## 2023-04-03 MED ORDER — SODIUM CHLORIDE 0.9 % IV SOLN
2.0000 g | INTRAVENOUS | Status: DC
  Administered 2023-04-03 – 2023-04-05 (×3): 2 g via INTRAVENOUS
  Filled 2023-04-03 (×3): qty 20

## 2023-04-03 MED ORDER — SUGAMMADEX SODIUM 200 MG/2ML IV SOLN
INTRAVENOUS | Status: DC | PRN
  Administered 2023-04-03: 200 mg via INTRAVENOUS

## 2023-04-03 MED ORDER — FENTANYL CITRATE (PF) 100 MCG/2ML IJ SOLN
INTRAMUSCULAR | Status: DC | PRN
  Administered 2023-04-03: 50 ug via INTRAVENOUS
  Administered 2023-04-03: 25 ug via INTRAVENOUS
  Administered 2023-04-03 (×4): 50 ug via INTRAVENOUS
  Administered 2023-04-03: 25 ug via INTRAVENOUS

## 2023-04-03 MED ORDER — MIDAZOLAM HCL 2 MG/2ML IJ SOLN: 1.0000 mg | INTRAMUSCULAR | Status: DC

## 2023-04-03 MED ORDER — FENTANYL CITRATE (PF) 100 MCG/2ML IJ SOLN
INTRAMUSCULAR | Status: AC
  Filled 2023-04-03: qty 2

## 2023-04-03 MED ORDER — ONDANSETRON HCL 4 MG PO TABS: 4.0000 mg | ORAL_TABLET | Freq: Four times a day (QID) | ORAL | Status: DC | PRN

## 2023-04-03 MED ORDER — LACTATED RINGERS IV SOLN: INTRAVENOUS | Status: DC

## 2023-04-03 MED ORDER — ACETAMINOPHEN 500 MG PO TABS
1000.0000 mg | ORAL_TABLET | Freq: Once | ORAL | Status: AC
  Administered 2023-04-03: 1000 mg via ORAL
  Filled 2023-04-03: qty 2

## 2023-04-03 MED ORDER — SODIUM CHLORIDE 0.9 % IR SOLN
Status: DC | PRN
  Administered 2023-04-03: 9000 mL

## 2023-04-03 MED ORDER — ONDANSETRON HCL 4 MG/2ML IJ SOLN
INTRAMUSCULAR | Status: DC | PRN
  Administered 2023-04-03: 4 mg via INTRAVENOUS

## 2023-04-03 MED ORDER — POLYETHYLENE GLYCOL 3350 17 G PO PACK: 17.0000 g | PACK | Freq: Every day | ORAL | Status: DC | PRN

## 2023-04-03 MED ORDER — CHLORHEXIDINE GLUCONATE 0.12 % MT SOLN
15.0000 mL | Freq: Once | OROMUCOSAL | Status: AC
  Administered 2023-04-03: 15 mL via OROMUCOSAL

## 2023-04-03 MED ORDER — LIDOCAINE HCL (PF) 2 % IJ SOLN
INTRAMUSCULAR | Status: AC
  Filled 2023-04-03: qty 5

## 2023-04-03 MED ORDER — ACETAMINOPHEN 500 MG PO TABS
1000.0000 mg | ORAL_TABLET | Freq: Four times a day (QID) | ORAL | Status: AC
  Administered 2023-04-03 – 2023-04-04 (×4): 1000 mg via ORAL
  Filled 2023-04-03 (×4): qty 2

## 2023-04-03 MED ORDER — OXYCODONE HCL 5 MG PO TABS: 5.0000 mg | ORAL_TABLET | ORAL | Status: DC | PRN

## 2023-04-03 MED ORDER — ONDANSETRON HCL 4 MG/2ML IJ SOLN: 4.0000 mg | Freq: Four times a day (QID) | INTRAMUSCULAR | Status: DC | PRN

## 2023-04-03 MED ORDER — OXYCODONE HCL 5 MG PO TABS: 10.0000 mg | ORAL_TABLET | ORAL | Status: DC | PRN

## 2023-04-03 MED ORDER — AMLODIPINE BESYLATE 5 MG PO TABS
5.0000 mg | ORAL_TABLET | Freq: Every day | ORAL | Status: DC
  Administered 2023-04-04 – 2023-04-07 (×4): 5 mg via ORAL
  Filled 2023-04-03 (×4): qty 1

## 2023-04-03 MED ORDER — ROCURONIUM BROMIDE 100 MG/10ML IV SOLN
INTRAVENOUS | Status: DC | PRN
  Administered 2023-04-03: 50 mg via INTRAVENOUS

## 2023-04-03 MED ORDER — METHOCARBAMOL 1000 MG/10ML IJ SOLN: 500.0000 mg | Freq: Four times a day (QID) | INTRAMUSCULAR | Status: DC | PRN

## 2023-04-03 MED ORDER — VANCOMYCIN HCL 1000 MG IV SOLR
INTRAVENOUS | Status: DC | PRN
  Administered 2023-04-03: 1000 mg via TOPICAL

## 2023-04-03 MED ORDER — ALPRAZOLAM 0.5 MG PO TABS
0.5000 mg | ORAL_TABLET | Freq: Three times a day (TID) | ORAL | Status: DC | PRN
  Administered 2023-04-06: 0.5 mg via ORAL
  Filled 2023-04-03: qty 1

## 2023-04-03 MED ORDER — ONDANSETRON HCL 4 MG/2ML IJ SOLN
INTRAMUSCULAR | Status: AC
  Filled 2023-04-03: qty 2

## 2023-04-03 MED ORDER — PHENYLEPHRINE HCL (PRESSORS) 10 MG/ML IV SOLN
INTRAVENOUS | Status: DC | PRN
  Administered 2023-04-03 (×3): 80 ug via INTRAVENOUS

## 2023-04-03 MED ORDER — SODIUM CHLORIDE 0.9 % IV SOLN
8.0000 mg/kg | Freq: Every day | INTRAVENOUS | Status: DC
  Administered 2023-04-03 – 2023-04-05 (×3): 800 mg via INTRAVENOUS
  Filled 2023-04-03 (×4): qty 16

## 2023-04-03 MED ORDER — VANCOMYCIN HCL 1000 MG IV SOLR
INTRAVENOUS | Status: DC | PRN
  Administered 2023-04-03: 1000 mg via INTRAVENOUS

## 2023-04-03 MED ORDER — TRANEXAMIC ACID-NACL 1000-0.7 MG/100ML-% IV SOLN
1000.0000 mg | INTRAVENOUS | Status: AC
  Administered 2023-04-03: 1000 mg via INTRAVENOUS
  Filled 2023-04-03: qty 100

## 2023-04-03 MED ORDER — VANCOMYCIN HCL 1000 MG IV SOLR
INTRAVENOUS | Status: AC
Start: 2023-04-03 — End: ?
  Filled 2023-04-03: qty 20

## 2023-04-03 MED ORDER — ACETAMINOPHEN 325 MG PO TABS
325.0000 mg | ORAL_TABLET | Freq: Four times a day (QID) | ORAL | Status: DC | PRN
  Administered 2023-04-04 – 2023-04-06 (×5): 650 mg via ORAL
  Filled 2023-04-03 (×5): qty 2

## 2023-04-03 MED ORDER — ORAL CARE MOUTH RINSE: 15.0000 mL | Freq: Once | OROMUCOSAL | Status: AC

## 2023-04-03 MED ORDER — DEXAMETHASONE SODIUM PHOSPHATE 10 MG/ML IJ SOLN
INTRAMUSCULAR | Status: AC
  Filled 2023-04-03: qty 1

## 2023-04-03 MED ORDER — METHOCARBAMOL 500 MG PO TABS: 500.0000 mg | ORAL_TABLET | Freq: Four times a day (QID) | ORAL | Status: DC | PRN

## 2023-04-03 MED ORDER — DIPHENHYDRAMINE HCL 12.5 MG/5ML PO ELIX: 12.5000 mg | ORAL_SOLUTION | ORAL | Status: DC | PRN

## 2023-04-03 MED ORDER — ROCURONIUM BROMIDE 10 MG/ML (PF) SYRINGE
PREFILLED_SYRINGE | INTRAVENOUS | Status: AC
  Filled 2023-04-03: qty 10

## 2023-04-03 MED ORDER — HYDROMORPHONE HCL 1 MG/ML IJ SOLN: 0.2500 mg | INTRAMUSCULAR | Status: DC | PRN

## 2023-04-03 MED ORDER — PROPOFOL 10 MG/ML IV BOLUS
INTRAVENOUS | Status: AC
  Filled 2023-04-03: qty 20

## 2023-04-03 MED ORDER — HYDROCHLOROTHIAZIDE 25 MG PO TABS
25.0000 mg | ORAL_TABLET | Freq: Every day | ORAL | Status: DC
  Administered 2023-04-04 – 2023-04-07 (×4): 25 mg via ORAL
  Filled 2023-04-03 (×4): qty 1

## 2023-04-03 MED ORDER — PROPOFOL 10 MG/ML IV BOLUS
INTRAVENOUS | Status: DC | PRN
  Administered 2023-04-03: 150 mg via INTRAVENOUS

## 2023-04-03 MED ORDER — FENTANYL CITRATE PF 50 MCG/ML IJ SOSY: 50.0000 ug | PREFILLED_SYRINGE | INTRAMUSCULAR | Status: DC

## 2023-04-03 MED ORDER — PHENYLEPHRINE 80 MCG/ML (10ML) SYRINGE FOR IV PUSH (FOR BLOOD PRESSURE SUPPORT)
PREFILLED_SYRINGE | INTRAVENOUS | Status: AC
  Filled 2023-04-03: qty 10

## 2023-04-03 MED ORDER — DOCUSATE SODIUM 100 MG PO CAPS
100.0000 mg | ORAL_CAPSULE | Freq: Two times a day (BID) | ORAL | Status: DC
  Administered 2023-04-03 – 2023-04-06 (×6): 100 mg via ORAL
  Filled 2023-04-03 (×8): qty 1

## 2023-04-03 MED ORDER — VANCOMYCIN HCL IN DEXTROSE 1-5 GM/200ML-% IV SOLN
INTRAVENOUS | Status: AC
  Filled 2023-04-03: qty 200

## 2023-04-03 MED ORDER — DROPERIDOL 2.5 MG/ML IJ SOLN: 0.6250 mg | Freq: Once | INTRAMUSCULAR | Status: DC | PRN

## 2023-04-03 MED ORDER — LIDOCAINE 2% (20 MG/ML) 5 ML SYRINGE
INTRAMUSCULAR | Status: DC | PRN
  Administered 2023-04-03: 50 mg via INTRAVENOUS

## 2023-04-03 MED ORDER — SODIUM CHLORIDE 0.9 % IV SOLN: INTRAVENOUS | Status: DC

## 2023-04-03 MED ORDER — HYDROMORPHONE HCL 1 MG/ML IJ SOLN: 0.5000 mg | INTRAMUSCULAR | Status: DC | PRN

## 2023-04-03 MED ORDER — SODIUM CHLORIDE 0.9 % IV SOLN: INTRAVENOUS | Status: AC

## 2023-04-03 SURGICAL SUPPLY — 66 items
BAG COUNTER SPONGE SURGICOUNT (BAG) ×2 IMPLANT
BAG ZIPLOCK 12X15 (MISCELLANEOUS) ×2 IMPLANT
BIT DRILL 4 DIA CALIBRATED (BIT) IMPLANT
BOOTIES KNEE HIGH SLOAN (MISCELLANEOUS) ×4 IMPLANT
CHLORAPREP W/TINT 26 (MISCELLANEOUS) ×2 IMPLANT
COVER SURGICAL LIGHT HANDLE (MISCELLANEOUS) ×2 IMPLANT
DRAPE INCISE IOBAN 66X45 STRL (DRAPES) ×2 IMPLANT
DRAPE POUCH INSTRU U-SHP 10X18 (DRAPES) ×2 IMPLANT
DRAPE SURG 17X11 SM STRL (DRAPES) ×2 IMPLANT
DRAPE SURG ORHT 6 SPLT 77X108 (DRAPES) ×4 IMPLANT
DRAPE U-SHAPE 47X51 STRL (DRAPES) ×2 IMPLANT
DRSG AQUACEL AG ADV 3.5X10 (GAUZE/BANDAGES/DRESSINGS) ×2 IMPLANT
DRSG MEPILEX POST OP 4X8 (GAUZE/BANDAGES/DRESSINGS) IMPLANT
DURAPREP 26ML APPLICATOR (WOUND CARE) ×4 IMPLANT
ELECT BLADE TIP CTD 4 INCH (ELECTRODE) ×2 IMPLANT
ELECT REM PT RETURN 15FT ADLT (MISCELLANEOUS) ×2 IMPLANT
EVACUATOR 1/8 PVC DRAIN (DRAIN) IMPLANT
FACESHIELD WRAPAROUND (MASK) ×2 IMPLANT
FACESHIELD WRAPAROUND OR TEAM (MASK) ×4 IMPLANT
GAUZE PAD ABD 8X10 STRL (GAUZE/BANDAGES/DRESSINGS) ×4 IMPLANT
GAUZE SPONGE 4X4 12PLY STRL (GAUZE/BANDAGES/DRESSINGS) ×2 IMPLANT
GAUZE XEROFORM 1X8 LF (GAUZE/BANDAGES/DRESSINGS) ×2 IMPLANT
GLOVE BIO SURGEON STRL SZ7 (GLOVE) ×2 IMPLANT
GLOVE BIO SURGEON STRL SZ7.5 (GLOVE) ×4 IMPLANT
GLOVE BIOGEL PI IND STRL 6.5 (GLOVE) ×2 IMPLANT
GLOVE BIOGEL PI IND STRL 7.0 (GLOVE) ×2 IMPLANT
GLOVE BIOGEL PI IND STRL 8 (GLOVE) ×2 IMPLANT
GLOVE ORTHO TXT STRL SZ7.5 (GLOVE) ×2 IMPLANT
GLOVE SURG POLYISO LF SZ6.5 (GLOVE) ×2 IMPLANT
GOWN STRL REUS W/ TWL LRG LVL3 (GOWN DISPOSABLE) ×2 IMPLANT
GOWN STRL REUS W/ TWL XL LVL3 (GOWN DISPOSABLE) ×4 IMPLANT
HEAD GLENOID W/SCREW 32MM (Shoulder) IMPLANT
HOOD PEEL AWAY T7 (MISCELLANEOUS) ×4 IMPLANT
INSERT EPOLY STND HUM SKT+4 36 (Shoulder) ×1 IMPLANT
INSERT EPOLYSTD HUM SKT+4 36 (Shoulder) IMPLANT
KIT BASIN OR (CUSTOM PROCEDURE TRAY) ×2 IMPLANT
KIT TURNOVER KIT A (KITS) IMPLANT
MANIFOLD NEPTUNE II (INSTRUMENTS) ×2 IMPLANT
NS IRRIG 1000ML POUR BTL (IV SOLUTION) ×2 IMPLANT
PACK SHOULDER (CUSTOM PROCEDURE TRAY) ×2 IMPLANT
PAD ARMBOARD 7.5X6 YLW CONV (MISCELLANEOUS) ×4 IMPLANT
PROTECTOR NERVE ULNAR (MISCELLANEOUS) ×2 IMPLANT
SET HNDPC FAN SPRY TIP SCT (DISPOSABLE) ×2 IMPLANT
SLING ARM FOAM STRAP LRG (SOFTGOODS) IMPLANT
SLING ARM IMMOBILIZER LRG (SOFTGOODS) ×2 IMPLANT
SPONGE T-LAP 18X18 ~~LOC~~+RFID (SPONGE) IMPLANT
SPONGE T-LAP 4X18 ~~LOC~~+RFID (SPONGE) ×4 IMPLANT
STAPLER SKIN PROX WIDE 3.9 (STAPLE) IMPLANT
STRIP CLOSURE SKIN 1/2X4 (GAUZE/BANDAGES/DRESSINGS) ×4 IMPLANT
SUCTION TUBE FRAZIER 12FR DISP (SUCTIONS) ×2 IMPLANT
SUPPORT WRAP ARM LG (MISCELLANEOUS) IMPLANT
SUT ETHIBOND 2 V 37 (SUTURE) IMPLANT
SUT ETHILON 2 0 PS N (SUTURE) IMPLANT
SUT ETHILON 3 0 PS 1 (SUTURE) IMPLANT
SUT MNCRL AB 4-0 PS2 18 (SUTURE) ×2 IMPLANT
SUT VIC AB 0 CT1 27XBRD ANBCTR (SUTURE) IMPLANT
SUT VIC AB 0 CT1 36 (SUTURE) ×2 IMPLANT
SUT VIC AB 2-0 CT1 TAPERPNT 27 (SUTURE) ×2 IMPLANT
SWAB COLLECTION DEVICE MRSA (MISCELLANEOUS) ×2 IMPLANT
SWAB CULTURE ESWAB REG 1ML (MISCELLANEOUS) IMPLANT
TAP SURG THRD DJ 6.5 (ORTHOPEDIC DISPOSABLE SUPPLIES) IMPLANT
TOWEL OR 17X26 10 PK STRL BLUE (TOWEL DISPOSABLE) ×2 IMPLANT
TUBING CONNECTING 10 (TUBING) ×2 IMPLANT
UNDERPAD 30X36 HEAVY ABSORB (UNDERPADS AND DIAPERS) ×2 IMPLANT
WATER STERILE IRR 1000ML POUR (IV SOLUTION) ×2 IMPLANT
YANKAUER SUCT BULB TIP NO VENT (SUCTIONS) ×2 IMPLANT

## 2023-04-03 NOTE — Transfer of Care (Signed)
 Immediate Anesthesia Transfer of Care Note  Patient: Nathan Stewart  Procedure(s) Performed: IRRIGATION AND DEBRIDEMENT SHOULDER (Left: Shoulder) SHOULDER REVISION (Left: Shoulder)  Patient Location: PACU  Anesthesia Type:General  Level of Consciousness: awake and alert   Airway & Oxygen Therapy: Patient Spontanous Breathing and Patient connected to face mask oxygen  Post-op Assessment: Report given to RN and Post -op Vital signs reviewed and stable  Post vital signs: Reviewed and stable  Last Vitals:  Vitals Value Taken Time  BP 148/80 04/03/23 1353  Temp 36.7 C 04/03/23 1353  Pulse 84 04/03/23 1353  Resp 17 04/03/23 1353  SpO2 99 % 04/03/23 1353  Vitals shown include unfiled device data.  Last Pain:  Vitals:   04/03/23 0924  TempSrc:   PainSc: 0-No pain         Complications: No notable events documented.

## 2023-04-03 NOTE — Anesthesia Preprocedure Evaluation (Addendum)
 Anesthesia Evaluation  Patient identified by MRN, date of birth, ID band Patient awake    Reviewed: Allergy & Precautions, NPO status , Patient's Chart, lab work & pertinent test results, reviewed documented beta blocker date and time   History of Anesthesia Complications Negative for: history of anesthetic complications  Airway Mallampati: II  TM Distance: >3 FB Neck ROM: Full    Dental no notable dental hx.    Pulmonary sleep apnea , former smoker   Pulmonary exam normal        Cardiovascular hypertension, Pt. on medications and Pt. on home beta blockers Normal cardiovascular exam     Neuro/Psych negative neurological ROS     GI/Hepatic Neg liver ROS,GERD  ,,  Endo/Other  negative endocrine ROS    Renal/GU negative Renal ROS     Musculoskeletal  (+) Arthritis ,  LEFT REVERSE TOTAL SHOULDER INFECTION   Abdominal   Peds  Hematology negative hematology ROS (+)   Anesthesia Other Findings Day of surgery medications reviewed with patient.  Reproductive/Obstetrics                             Anesthesia Physical Anesthesia Plan  ASA: 2  Anesthesia Plan: General   Post-op Pain Management: Tylenol  PO (pre-op)* and Regional block*   Induction: Intravenous  PONV Risk Score and Plan: 2 and Ondansetron , Dexamethasone , Treatment may vary due to age or medical condition and Midazolam   Airway Management Planned: Oral ETT  Additional Equipment: None  Intra-op Plan:   Post-operative Plan: Extubation in OR  Informed Consent: I have reviewed the patients History and Physical, chart, labs and discussed the procedure including the risks, benefits and alternatives for the proposed anesthesia with the patient or authorized representative who has indicated his/her understanding and acceptance.     Dental advisory given  Plan Discussed with: CRNA  Anesthesia Plan Comments:         Anesthesia Quick Evaluation

## 2023-04-03 NOTE — H&P (Signed)
 Breeze K Kentner is an 68 y.o. male.   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   GEN OSTEOARTHROSIS INVOLVING MULTIPLE SITES 11/10/2007   GERD 01/01/2007   Hx of adenomatous polyp of colon 04/04/2016   Hypertension    INSOMNIA, CHRONIC 11/13/2009   LIVER FUNCTION TESTS, ABNORMAL 08/27/2007   LOC OSTEOARTHROS NOT SPEC PRIM/SEC LOWER LEG 11/10/2007   Memory loss 08/27/2007   MENISCUS TEAR 10/30/2006   OSTEOARTHRITIS 06/07/2008   Pneumonia    SLEEP APNEA 09/20/2009   no cpap   TESTICULAR HYPOFUNCTION 08/14/2009    Past Surgical History:  Procedure Laterality Date   COLONOSCOPY     HAND SURGERY  over 20 years ago   'metal plate in wrist'   JOINT REPLACEMENT N/A    [employer]reesia 10/19/2019   MULTIPLE TOOTH EXTRACTIONS     SHOULDER SURGERY Right 2021   ligament damage   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   Procedure: TOTAL KNEE ARTHROPLASTY;  Surgeon: Dempsey Sensor, MD;  Location: [facility];  Service: Orthopedics;  Laterality: Left;   TOTAL KNEE ARTHROPLASTY Right 05/21/2021   Procedure: RIGHT TOTAL KNEE ARTHROPLASTY;  Surgeon: Sensor Dempsey, MD;  Location: [facility];  Service: Orthopedics;  Laterality: Right;   TOTAL SHOULDER REVISION Left 03/11/2023   Procedure: LEFT SHOULDER REVISION GLENOID COMPONENT;  Surgeon: Dozier Soulier, MD;  Location: [facility];  Service: Orthopedics;  Laterality: Left;    Family History  Problem Relation Age of Onset   Arthritis Mother    Heart disease Mother    Hypertension Mother    Hypertension Father    Stroke Father    Hypertension Sister    Stroke Brother    Colon cancer Paternal Uncle    Social History:  reports that he quit smoking about 26 years ago. His smoking use included cigarettes. He has  been exposed to tobacco smoke. He has never used smokeless tobacco. He reports current alcohol use of about 6.0 standard drinks of alcohol per week. He reports that he does not use drugs.  Allergies:  Allergies  Allergen Reactions   Hydrocodone -Acetaminophen  Nausea And Vomiting   Irbesartan      Tingling, swelling  Pt unaware of allergy reaction    Percocet [Oxycodone -Acetaminophen ] Nausea And Vomiting   Shingrix [Zoster Vac Recomb Adjuvanted] Itching    Facility-Administered Medications Prior to Admission  Medication Dose Route Frequency Provider Last Rate Last Admin   0.9 %  sodium chloride  infusion  500 mL Intravenous Continuous Avram Lupita BRAVO, MD       Medications Prior to Admission  Medication Sig Dispense Refill   ALPRAZolam  (XANAX ) 0.5 MG tablet TAKE 1 TABLET(0.5 MG) BY MOUTH THREE TIMES DAILY AS NEEDED FOR ANXIETY 30 tablet 3   amLODipine  (NORVASC ) 5 MG tablet TAKE 1 TABLET(5 MG) BY MOUTH DAILY 90 tablet 3   ELDERBERRY PO Take 1 tablet by mouth daily.     Garlic 1000 MG CAPS Take 1,000 mg by mouth daily.     hydrochlorothiazide  (HYDRODIURIL ) 25 MG tablet TAKE 1 TABLET(25 MG) BY MOUTH DAILY 90 tablet 1   Magnesium 250 MG TABS Take 250 mg by mouth daily.     metoprolol  succinate (TOPROL -XL) 100 MG 24 hr tablet TAKE 1 TABLET BY MOUTH EVERY DAY WITH OR IMMEDIATELY FOLLOWING  A MEAL 90 tablet 1   Multiple Vitamin (MULTIVITAMIN) tablet Take 1 tablet by mouth daily. Adult 50+     Omega-3 Fatty Acids (FISH OIL) 1000 MG CPDR Take 2,000 mg by mouth daily.     vitamin C (ASCORBIC ACID) 250 MG tablet Take 250 mg by mouth daily.      Results for orders placed or performed during the hospital encounter of 04/03/23 (from the past 48 hours)  CBC per protocol     Status: Abnormal   Collection Time: 04/03/23  9:25 AM  Result Value Ref Range   WBC 10.9 (H) 4.0 - 10.5 K/uL   RBC 4.37 4.22 - 5.81 MIL/uL   Hemoglobin 12.7 (L) 13.0 - 17.0 g/dL   HCT 59.3 60.9 - 47.9 %   MCV 92.9 80.0 - 100.0  fL   MCH 29.1 26.0 - 34.0 pg   MCHC 31.3 30.0 - 36.0 g/dL   RDW 88.1 88.4 - 84.4 %   Platelets 215 150 - 400 K/uL   nRBC 0.0 0.0 - 0.2 %    Comment: Performed at [facility], [address] [place], [place] [address]   No results found.  Review of Systems  All other systems reviewed and are negative.   Blood pressure (!) 146/89, pulse 89, temperature 98 F (36.7 C), temperature source Oral, resp. rate 19, height 6' 2 (1.88 m), weight 103.4 kg, SpO2 97%. Physical Exam Constitutional:      Appearance: He is well-developed.  HENT:     Head: Atraumatic.  Eyes:     Extraocular Movements: Extraocular movements intact.  Cardiovascular:     Pulses: Normal pulses.  Pulmonary:     Effort: Pulmonary effort is normal.  Musculoskeletal:     Comments: L shoulder incision with purulent drainage.  Skin:    General: Skin is warm and dry.  Neurological:     Mental Status: He is alert and oriented to person, place, and time.  Psychiatric:        Mood and Affect: Mood normal.      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 Plan irrigation debridement with component exchange He will be admitted postoperatively for IV antibiotics and infectious disease consultation. Risks / benefits of surgery discussed Consent on chart  NPO for OR Preop antibiotics held   Josefa LELON Herring, MD 04/03/2023, 10:28 AM

## 2023-04-03 NOTE — Progress Notes (Signed)
 Pharmacy Antibiotic Note  Nathan Stewart is a 68 y.o. male who developed shoulder infection s/p left reverse total shoulder arthroplasty and subsequent left shoulder revision on 03/11/23. He presented to [facility] on 04/03/23 for I&D with revision of humeral and glenoid components.  Per Dr. Fleeta Rothman via [place] msg, start daptomycin  and ceftriaxone  per pharmacy for infection.  Today, 04/03/2023: - scr 0.87 (crcl >100) - wbc 10.9 - SR elevated 58, CRP elevated 7.6  Plan: - Daptomycin  8 mg/kg q24h - ceftriaxone  2gm q24h - CK on 04/04/23 and weekly  ____________________________________________  Height: 6' 2 (188 cm) Weight: 103.4 kg (227 lb 15.3 oz) IBW/kg (Calculated) : 82.2  Temp (24hrs), Avg:98 F (36.7 C), Min:98 F (36.7 C), Max:98 F (36.7 C)  Recent Labs  Lab 04/03/23 0925  WBC 10.9*  CREATININE 0.87    Estimated Creatinine Clearance: 105.7 mL/min (by C-G formula based on SCr of 0.87 mg/dL).    Allergies  Allergen Reactions   Hydrocodone -Acetaminophen  Nausea And Vomiting   Irbesartan      Tingling, swelling  Pt unaware of allergy reaction    Percocet [Oxycodone -Acetaminophen ] Nausea And Vomiting   Shingrix [Zoster Vac Recomb Adjuvanted] Itching     Thank you for allowing pharmacy to be a part of this patient's care.  Nathan Stewart 04/03/2023 3:26 PM

## 2023-04-03 NOTE — Op Note (Signed)
 Procedure(s):  Procedure Note  Nathan Stewart male 68 y.o. 04/03/2023  Preoperative diagnosis: Acute left shoulder postoperative infection with reverse total shoulder arthroplasty  Postoperative diagnosis: Same  Procedure performed: Extensive irrigation and debridement of infection with revision of humeral and glenoid components   Indications:  68 y.o. 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  He was taken back to the operating room approximately 3 weeks ago for revision of the glenosphere component which was successful.  Postoperatively he had some drainage and ultimately had purulent drainage with obvious infection of the joint.  Indicated for surgical treatment to try and eradicate the infection.      Surgeon: Josefa LELON Herring   Assistants: Jeoffrey Northern PA-C Amber was present and scrubbed throughout the procedure and was essential in positioning, retraction, exposure, and closure)  Anesthesia: General endotracheal anesthesia    Procedure Detail   Estimated Blood Loss:  less than 100 mL         Drains: none  Blood Given: none          Specimens: none        Complications:  * No complications entered in OR log *         Disposition: PACU - hemodynamically stable.         Condition: stable      OPERATIVE FINDINGS: Seropurulent drainage was noted.  Multiple cultures were taken.  An extensive irrigation and debridement was carried out with removal and replacement of both the glenosphere and the humeral polyethylene component.  PROCEDURE: The patient was identified in the preoperative holding area  where I personally marked the operative site after verifying site, side,  and procedure with the patient. the patient was taken back to the operating room where all extremities were  carefully padded in position after general anesthesia was induced. She  was placed in a beach-chair position and the operative  upper extremity was  prepped and draped in a standard sterile fashion.  Antibiotics were held.  The previously marked out incision was incised.  Seropurulent drainage was noted.  Cultures were sent from the fascial layer.  The fascial and muscular layers were identified and developed beneath the deltoid and pectoralis major.  The components were exposed.  Initial irrigation was carried out.  The joint was then dislocated and the humeral polyethylene component was removed.  The humeral stem was well-fixed.  The stem was copiously irrigated.  The glenosphere was exposed and removed after the central screw was removed.  Cultures were sent from the deep joint and from the baseplate.  Again copious irrigation was used.  Cobb elevator and rongeurs were used to debride all of the surrounding tissues extensively.  Once adequate debridement was felt to be achieved and 3 L normal saline was used to irrigate the joint was then bathed in a dilute iodine  solution and allowed to sit for 3 minutes.  This was then removed and the joint was again irrigated with 3 L normal saline.  At this point all of the gloves and back table were changed to sterile equipment and the new 36 standard glenosphere was impacted and screwed in place.  The new polyethylene component was then again impacted and the joint was reduced.  At this point an additional 1.5 L of normal saline were used to irrigate.  The joint was then bathed in Irrisept for an additional 3 minutes.  Irrisept was then evacuated and the joint was again  irrigated with an additional 1.5 L normal saline for total pulse lavage irrigation of 9 L. A medium Hemovac drain was then placed out percutaneously beneath the deltoid.  The wound was then closed in layers with antibiotic impregnated Vicryl for the deep fascial closure and 2-0 nylon in a horizontal mattress configuration for skin closure to try and evert the edges.  A bulky sterile dressing was applied.   The patient was  allowed  to awaken from general anesthesia, transferred to stretcher, and taken  to recovery room in stable condition.   POSTOPERATIVE PLAN: The patient will be admitted to the hospital for IV antibiotics.  We will start with broad-spectrum antibiotics and await cultures.  Infectious disease will be consulted for management of antibiotics.  He will likely stay in the hospital 2 to 3 days.

## 2023-04-03 NOTE — Discharge Instructions (Addendum)
 Discharge Instructions after Reverse Total Shoulder Arthroplasty   A sling has been provided for you. You are to wear this at all times (except for bathing and dressing), until your first post operative visit with Dr. Dozier. Please also wear while sleeping at night. While you bath and dress, let the arm/elbow extend straight down to stretch your elbow. Wiggle your fingers and pump your first while your in the sling to prevent hand swelling. Use ice on the shoulder intermittently over the first 48 hours after surgery. Continue to use ice or and ice machine as needed after 48 hours for pain control/swelling.  Pain medicine has been prescribed for you.  Use your medicine liberally over the first 48 hours, and then you can begin to taper your use. You may take Extra Strength Tylenol  or Tylenol  only in place of the pain pills. DO NOT take ANY nonsteroidal anti-inflammatory pain medications: Advil, Motrin, Ibuprofen, Aleve, Naproxen or Naprosyn.  Take one aspirin  a day for 2 weeks after surgery, unless you have an aspirin  sensitivity/allergy or asthma.  Change dressing every 2 days as needed. Do not get area wet for at least 5 days Simply allow the water  to wash over the site and then pat dry. Make sure your axilla (armpit) is completely dry after showering.    Please call [phone] during normal business hours or [phone] after hours for any problems. Including the following:  - excessive redness of the incisions - drainage for more than 4 days - fever of more than 101.5 F  *Please note that pain medications will not be refilled after hours or on weekends.

## 2023-04-03 NOTE — Anesthesia Procedure Notes (Signed)
 Procedure Name: Intubation Date/Time: 04/03/2023 12:29 PM  Performed by: Harvey Josette CROME, CRNAPre-anesthesia Checklist: Patient identified, Emergency Drugs available, Suction available and Patient being monitored Patient Re-evaluated:Patient Re-evaluated prior to induction Oxygen Delivery Method: Circle system utilized Preoxygenation: Pre-oxygenation with 100% oxygen Induction Type: IV induction Ventilation: Mask ventilation without difficulty Laryngoscope Size: Glidescope and 3 Grade View: Grade I Tube type: Oral Tube size: 7.5 mm Number of attempts: 1 Secured at: 23 cm Tube secured with: Tape Dental Injury: Teeth and Oropharynx as per pre-operative assessment

## 2023-04-03 NOTE — Plan of Care (Signed)
 Problem: Education: Goal: Knowledge of General Education information will improve Description: Including pain rating scale, medication(s)/side effects and non-pharmacologic comfort measures Outcome: Progressing   Problem: Health Behavior/Discharge Planning: Goal: Ability to manage health-related needs will improve Outcome: Progressing   Problem: Clinical Measurements: Goal: Ability to maintain clinical measurements within normal limits will improve Outcome: Progressing   Problem: Activity: Goal: Risk for activity intolerance will decrease Outcome: Progressing   Problem: Nutrition: Goal: Adequate nutrition will be maintained Outcome: Progressing   Problem: Coping: Goal: Level of anxiety will decrease Outcome: Progressing   Problem: Pain Managment: Goal: General experience of comfort will improve and/or be controlled Outcome: Progressing   Problem: Safety: Goal: Ability to remain free from injury will improve Outcome: Progressing   Jon LULLA Reins, RN 04/03/23 7:48 PM

## 2023-04-04 ENCOUNTER — Encounter (HOSPITAL_COMMUNITY): Payer: Self-pay | Admitting: Orthopedic Surgery

## 2023-04-04 DIAGNOSIS — Z96611 Presence of right artificial shoulder joint: Secondary | ICD-10-CM | POA: Diagnosis not present

## 2023-04-04 DIAGNOSIS — T8459XA Infection and inflammatory reaction due to other internal joint prosthesis, initial encounter: Secondary | ICD-10-CM | POA: Diagnosis not present

## 2023-04-04 LAB — COMPREHENSIVE METABOLIC PANEL
ALT: 33 U/L (ref 0–44)
AST: 24 U/L (ref 15–41)
Albumin: 3.5 g/dL (ref 3.5–5.0)
Alkaline Phosphatase: 60 U/L (ref 38–126)
Anion gap: 12 (ref 5–15)
BUN: 15 mg/dL (ref 8–23)
CO2: 21 mmol/L — ABNORMAL LOW (ref 22–32)
Calcium: 8.9 mg/dL (ref 8.9–10.3)
Chloride: 99 mmol/L (ref 98–111)
Creatinine, Ser: 0.84 mg/dL (ref 0.61–1.24)
GFR, Estimated: >60 mL/min (ref >60)
Glucose, Bld: 202 mg/dL — ABNORMAL HIGH (ref 70–99)
Potassium: 4 mmol/L (ref 3.5–5.1)
Sodium: 132 mmol/L — ABNORMAL LOW (ref 135–145)
Total Bilirubin: 0.6 mg/dL (ref 0.0–1.2)
Total Protein: 7.1 g/dL (ref 6.5–8.1)

## 2023-04-04 LAB — CBC
HCT: 31.4 % — ABNORMAL LOW (ref 39.0–52.0)
Hemoglobin: 10.4 g/dL — ABNORMAL LOW (ref 13.0–17.0)
MCH: 29.7 pg (ref 26.0–34.0)
MCHC: 33.1 g/dL (ref 30.0–36.0)
MCV: 89.7 fL (ref 80.0–100.0)
Platelets: 279 10*3/uL (ref 150–400)
RBC: 3.5 MIL/uL — ABNORMAL LOW (ref 4.22–5.81)
RDW: 11.7 % (ref 11.5–15.5)
WBC: 14.6 10*3/uL — ABNORMAL HIGH (ref 4.0–10.5)
nRBC: 0 % (ref 0.0–0.2)

## 2023-04-04 LAB — CK: Total CK: 145 U/L (ref 49–397)

## 2023-04-04 MED ORDER — METHOCARBAMOL 500 MG PO TABS: 500.0000 mg | ORAL_TABLET | Freq: Four times a day (QID) | ORAL | 0 refills | Status: DC | PRN

## 2023-04-04 MED ORDER — OXYCODONE HCL 5 MG PO TABS: 5.0000 mg | ORAL_TABLET | Freq: Four times a day (QID) | ORAL | 0 refills | Status: DC | PRN

## 2023-04-04 MED ORDER — ONDANSETRON HCL 4 MG PO TABS: 4.0000 mg | ORAL_TABLET | Freq: Four times a day (QID) | ORAL | 0 refills | Status: DC | PRN

## 2023-04-04 NOTE — Anesthesia Postprocedure Evaluation (Signed)
 Anesthesia Post Note  Patient: Nathan Stewart  Procedure(s) Performed: IRRIGATION AND DEBRIDEMENT SHOULDER (Left: Shoulder) SHOULDER REVISION (Left: Shoulder)     Patient location during evaluation: PACU Anesthesia Type: General Level of consciousness: awake and alert Pain management: pain level controlled Vital Signs Assessment: post-procedure vital signs reviewed and stable Respiratory status: spontaneous breathing, nonlabored ventilation and respiratory function stable Cardiovascular status: blood pressure returned to baseline Postop Assessment: no apparent nausea or vomiting Anesthetic complications: no   No notable events documented.            Vertell Row

## 2023-04-04 NOTE — Consult Note (Addendum)
 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 Screening for viral hepatitides and HIV screen negative for HIV in 2019 and negative for hep A and B and C in May 2024 will check his antibody levels to hep B and recheck HIV antibody   Nathan Stewart has an appointment on 04/30/2023 at 245PM with Dr. Fleeta Rothman  at  [facility], which  is located in the [facility] at  [address] in [place].  [address], which is located to the left of the elevators.  Phone: [phone]  Fax: [phone]  [email or web address]  The patient should arrive 30 minutes prior to their appoitment.  Dr. Efrain is on this weekend and will follow-up his culture data and Dr. Dennise will take over the service on Monday.  Principal Problem:   Infection of prosthetic total shoulder joint, initial encounter (HCC)   Scheduled Meds:  acetaminophen   1,000 mg Oral Q6H   amLODipine   5 mg Oral Daily   docusate sodium   100 mg Oral BID   hydrochlorothiazide   25 mg Oral Daily   metoprolol  succinate  100 mg Oral Daily   Continuous Infusions:  sodium chloride  Stopped (04/04/23 0936)   cefTRIAXone  (ROCEPHIN )  IV 2 g (04/03/23 1931)   DAPTOmycin  800 mg (04/03/23 1826)   PRN Meds:.acetaminophen , ALPRAZolam , diphenhydrAMINE , HYDROmorphone  (DILAUDID ) injection, methocarbamol  **OR** methocarbamol  (ROBAXIN ) injection, ondansetron  **OR** ondansetron  (ZOFRAN ) IV, oxyCODONE , oxyCODONE , polyethylene glycol  HPI: Nathan Stewart is a 67 y.o. male  with past medical history significant for left reverse shoulder arthroplasty requiring subsequent revision due to failure of the glenoid sphere roughly 3 weeks ago.  He tells me that the wound and surgery never entirely healed up and was draining material which progressed and became overtly purulent when he was undergoing physical therapy last week and was evaluated by Dr. Herring and ultimately admitted to the hospital where he has undergone extensive irrigation debridement along with revision of the humeral and glenoid components.  Cultures appear to be growing a streptococcal species based on Gram stain though cultures are still incubating we have placed him on daptomycin  and ceftriaxone  while we await for the culture data to come back.  I will place an order for PICC line and plan on giving him 6 weeks of IV antibiotics followed by oral antibiotics likely for at least 6 months postoperatively potentially a year postoperatively which is my own bias. Others do treat for shorter duration even as short as 3 months.  My partner Dr. Efrain will follow-up the culture data this weekend and adjust antibiotics as needed.  Dr. Dennise will take over the service on Monday.  I will schedule an appointment for the patient to follow-up with me after his hospital stay.  I have personally spent 84 minutes involved in face-to-face and non-face-to-face activities for this patient on the day of the visit. Professional time spent includes the following activities: Preparing to see the patient (review of tests), Obtaining and/or reviewing separately obtained history (admission/discharge record), Performing a medically appropriate examination and/or evaluation ,  Ordering medications/tests/procedures, referring and communicating with other health care professionals, Documenting clinical information in the EMR, Independently interpreting results (not separately reported), Communicating results to the patient/family/caregiver,  Counseling and educating the patient/family/caregiver and Care coordination (not separately reported).   Evaluation of the patient requires complex antimicrobial therapy evaluation, counseling , isolation needs to reduce disease transmission and risk assessment and mitigation. \   Review of Systems: Review of Systems  Constitutional:  Negative for chills, diaphoresis, fever, malaise/fatigue and weight loss.  HENT:  Negative for congestion, hearing loss, sore throat and tinnitus.   Eyes:  Negative for blurred vision and double vision.  Respiratory:  Negative for cough, sputum production, shortness of breath and wheezing.   Cardiovascular:  Negative for chest pain, palpitations and leg swelling.  Gastrointestinal:  Negative for abdominal pain, blood in stool, constipation, diarrhea, heartburn, melena, nausea and vomiting.  Genitourinary:  Negative for dysuria, flank pain and hematuria.  Musculoskeletal:  Positive for joint pain. Negative for back pain, falls and myalgias.  Skin:  Negative for itching and rash.  Neurological:  Negative for dizziness, sensory change, focal weakness, loss of consciousness, weakness and headaches.  Endo/Heme/Allergies:  Does not bruise/bleed easily.  Psychiatric/Behavioral:  Negative for depression, memory loss and suicidal ideas. The patient is not nervous/anxious.     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   GEN OSTEOARTHROSIS INVOLVING MULTIPLE SITES 11/10/2007   GERD 01/01/2007   Hx of adenomatous polyp of colon 04/04/2016   Hypertension    INSOMNIA, CHRONIC 11/13/2009   LIVER FUNCTION TESTS, ABNORMAL 08/27/2007   LOC OSTEOARTHROS NOT SPEC PRIM/SEC LOWER LEG 11/10/2007   Memory loss 08/27/2007   MENISCUS TEAR 10/30/2006   OSTEOARTHRITIS 06/07/2008   Pneumonia    SLEEP APNEA 09/20/2009   no cpap   TESTICULAR HYPOFUNCTION 08/14/2009    Social History   Tobacco Use   Smoking  status: Former    Current packs/day: 0.00    Types: Cigarettes    Quit date: 02/25/1997    Years since quitting: 26.1    Passive exposure: Current   Smokeless tobacco: Never   Tobacco comments:    quit 1999  Vaping Use   Vaping status: Never Used  Substance Use Topics   Alcohol use: Yes    Alcohol/week: 6.0 standard drinks of alcohol    Types: 6 Cans of beer per week    Comment: occasional   Drug use: No    Family History  Problem Relation Age of Onset   Arthritis Mother    Heart disease Mother    Hypertension Mother    Hypertension Father    Stroke Father    Hypertension Sister    Stroke Brother    Colon cancer Paternal Uncle    Allergies  Allergen Reactions   Hydrocodone -Acetaminophen  Nausea And Vomiting   Irbesartan      Tingling, swelling  Pt unaware of allergy reaction    Percocet [Oxycodone -Acetaminophen ] Nausea And Vomiting   Shingrix [Zoster Vac Recomb Adjuvanted] Itching    OBJECTIVE: Blood pressure 133/71, pulse 89, temperature 98.8 F (37.1 C), temperature source Oral, resp. rate 18, height 6' 2 (1.88 m), weight 103.4 kg, SpO2 95%.  Physical Exam Constitutional:      Appearance: He is well-developed.  HENT:     Head: Normocephalic and atraumatic.  Eyes:     Conjunctiva/sclera: Conjunctivae normal.  Cardiovascular:     Rate and Rhythm: Normal rate and regular  rhythm.  Pulmonary:     Effort: Pulmonary effort is normal. No respiratory distress.     Breath sounds: No wheezing.  Abdominal:     General: There is no distension.     Palpations: Abdomen is soft.  Musculoskeletal:     Cervical back: Normal range of motion and neck supple.  Skin:    General: Skin is warm and dry.     Coloration: Skin is not pale.     Findings: No erythema or rash.  Neurological:     General: No focal deficit present.     Mental Status: He is alert and oriented to person, place, and time.  Psychiatric:        Mood and Affect: Mood normal.        Behavior: Behavior  normal.        Thought Content: Thought content normal.        Judgment: Judgment normal.    Shoulder bandaged  Lab Results Lab Results  Component Value Date   WBC 14.6 (H) 04/04/2023   HGB 10.4 (L) 04/04/2023   HCT 31.4 (L) 04/04/2023   MCV 89.7 04/04/2023   PLT 279 04/04/2023    Lab Results  Component Value Date   CREATININE 0.84 04/04/2023   BUN 15 04/04/2023   NA 132 (L) 04/04/2023   K 4.0 04/04/2023   CL 99 04/04/2023   CO2 21 (L) 04/04/2023    Lab Results  Component Value Date   ALT 33 04/04/2023   AST 24 04/04/2023   ALKPHOS 60 04/04/2023   BILITOT 0.6 04/04/2023     Microbiology: Recent Results (from the past 240 hours)  Aerobic/Anaerobic Culture w Gram Stain (surgical/deep wound)     Status: None (Preliminary result)   Collection Time: 04/03/23 12:04 PM   Specimen: Synovial, Left Shoulder; Body Fluid  Result Value Ref Range Status   Specimen Description   Final    SYNOVIAL LEFT SHOULDER FASCIAL SWABS  X2 SPECIMEN A Performed at [facility], [address] [place], [place] [address]    Special Requests   Final    NONE Performed at [facility], [address], [place], [place] [address]    Gram Stain NO WBC SEEN NO ORGANISMS SEEN   Final   Culture   Final    CULTURE REINCUBATED FOR BETTER GROWTH Performed at [facility], [address], [place], [place] [address]    Report Status PENDING  Incomplete  Aerobic/Anaerobic Culture w Gram Stain (surgical/deep wound)     Status: None (Preliminary result)   Collection Time: 04/03/23 12:06 PM   Specimen: Soft Tissue, Other  Result Value Ref Range Status   Specimen Description   Final    TISSUE LEFT SHOULDER JOINT TISSUE SPECIMEN B Performed at [facility], [address] [place], [place] [address]    Special Requests   Final    NONE Performed at [facility], [address], [place], [place] [address]    Gram Stain  NO WBC SEEN NO ORGANISMS SEEN   Final   Culture   Final    CULTURE REINCUBATED FOR BETTER GROWTH Performed at [facility], [address] [place], [place] [address]    Report Status PENDING  Incomplete  Aerobic/Anaerobic Culture w Gram Stain (surgical/deep wound)     Status: None (Preliminary result)   Collection Time: 04/03/23 12:26 PM   Specimen: Soft Tissue, Other  Result Value Ref Range  Status   Specimen Description   Final    TISSUE LEFT SHOULDER BASEPLATE SPECIMEN C Performed at [facility], [address] [place], [place] [address]    Special Requests   Final    NONE Performed at [facility], [address] [place], [place] [address]    Gram Stain   Final    RARE WBC PRESENT, PREDOMINANTLY PMN RARE GRAM POSITIVE COCCI IN PAIRS    Culture   Final    CULTURE REINCUBATED FOR BETTER GROWTH Performed at [facility], [address] [place], [place] [address]    Report Status PENDING  Incomplete    Jomarie Fleeta Rothman, MD [facility] [phone] pager  04/04/2023, 10:15 AM

## 2023-04-04 NOTE — Progress Notes (Signed)
 PATIENT ID: Nathan Stewart  MRN: [id number]  DOB/AGE:  68-Sep-1957 / 68 y.o.  1 Day Post-Op Procedure(s) (LRB): IRRIGATION AND DEBRIDEMENT SHOULDER (Left) SHOULDER REVISION (Left)  Subjective: Pain is mild.  No c/o chest pain or SOB. Patient resting comfortably in room. Wife is in room with patient and questions/concerns addressed.    Objective: Vital signs in last 24 hours: Temp:  [98 F (36.7 C)-98.8 F (37.1 C)] 98.8 F (37.1 C) (02/07 0535) Pulse Rate:  [79-89] 89 (02/07 0535) Resp:  [14-20] 18 (02/07 0535) BP: (128-170)/(71-102) 133/71 (02/07 0535) SpO2:  [92 %-98 %] 95 % (02/07 0535) Weight:  [103.4 kg] 103.4 kg (02/06 0924)  Intake/Output from previous day: 02/06 0701 - 02/07 0700 In: 2961.9 [P.O.:840; I.V.:1355.9; IV Piggyback:766] Out: 1345 [Urine:1200; Drains:95; Blood:50]   Recent Labs    04/03/23 0925 04/04/23 0355  HGB 12.7* 10.4*   Recent Labs    04/03/23 0925 04/04/23 0355  WBC 10.9* 14.6*  RBC 4.37 3.50*  HCT 40.6 31.4*  PLT 215 279   Recent Labs    04/03/23 0925 04/04/23 0355  NA 134* 132*  K 3.8 4.0  CL 99 99  CO2 22 21*  BUN 13 15  CREATININE 0.87 0.84  GLUCOSE 167* 202*  CALCIUM 9.6 8.9     Physical Exam: Neurologically intact Sensation intact distally Intact pulses distally Incision: dressing C/D/I Able to move fingers, hand, and wrist of LUE  Mild bloody drainage from hemovac  Assessment/Plan: 1 Day Post-Op Procedure(s) (LRB): IRRIGATION AND DEBRIDEMENT SHOULDER (Left) SHOULDER REVISION (Left)   Advance diet Continue ABX therapy due to Post-op infection Non Weight Bearing (NWB) LUE VTE prophylaxis:  SCDs and aspirin  81mg   Patient may come out of the sling a couple times per day for hand, wrist, and elbow motion but no motion of the shoulder yet. Will keep dressing on shoulder a couple more days until hemovac removal. Patient will continue antibiotic treatment per ID. Likely PICC line placement. Hopeful for DC home in a  couple of days but informed patient that it will depend on how quickly we can get outpatient antibiotic regimen established.   Kaysin Brock L. [place], PA-C 04/04/2023, 7:47 AM

## 2023-04-04 NOTE — Plan of Care (Signed)
  Problem: Education: Goal: Knowledge of General Education information will improve Description: Including pain rating scale, medication(s)/side effects and non-pharmacologic comfort measures Outcome: Progressing   Problem: Activity: Goal: Risk for activity intolerance will decrease Outcome: Progressing   Problem: Elimination: Goal: Will not experience complications related to urinary retention Outcome: Progressing   Problem: Pain Managment: Goal: General experience of comfort will improve and/or be controlled Outcome: Progressing   Problem: Safety: Goal: Ability to remain free from injury will improve Outcome: Progressing

## 2023-04-04 NOTE — TOC Initial Note (Signed)
 Transition of Care [facility]) - Initial/Assessment Note    Patient Details  Name: Nathan Stewart MRN: [id number] Date of Birth: 04-Mar-1955  Transition of Care [facility]) CM/SW Contact:    Hoy DELENA Bigness, LCSW Phone Number: 04/04/2023, 2:52 PM  Clinical Narrative:                 Pt planned to return home with 6 weeks of IV abx. Spoke with pt and confirmed plan. PT shares his daughter is a engineer, civil (consulting) and will be assisting him with treatment at home. Holley Herring with [facility] is following pt and will be providing home IV abx and needed equipment. Pam to provide education to pt and family in the hospital prior to discharge. [facility] will also provide ongoing home health nursing for pt while receiving IV abx.   Expected Discharge Plan: Home w Home Health Services Barriers to Discharge: No Barriers Identified   Patient Goals and CMS Choice Patient states their goals for this hospitalization and ongoing recovery are:: To return home CMS Medicare.gov Compare Post Acute Care list provided to:: Patient Choice offered to / list presented to : Patient [place] ownership interest in [facility].provided to::  (NA)    Expected Discharge Plan and Services In-house Referral: Clinical Social Work Discharge Planning Services: NA Post Acute Care Choice: Home Health, Durable Medical Equipment Living arrangements for the past 2 months: Single Family Home                 DME Arranged: IV pump/equipment DME Agency: Other - Comment [place]) Date DME Agency Contacted: 04/04/23 Time DME Agency Contacted: 1450 Representative spoke with at DME Agency: Holley Herring HH Arranged: RN HH Agency: [facility] Date HH Agency Contacted: 04/04/23 Time HH Agency Contacted: 1451 Representative spoke with at [facility] Agency: Holley Herring  Prior Living Arrangements/Services Living arrangements for the past 2 months: Single Family Home Lives with:: Spouse Patient language and need for interpreter reviewed:: Yes Do you feel  safe going back to the place where you live?: Yes      Need for Family Participation in Patient Care: Yes (Comment) Care giver support system in place?: No (comment) Current home services: DME Criminal Activity/Legal Involvement Pertinent to Current Situation/Hospitalization: No - Comment as needed  Activities of Daily Living   ADL Screening (condition at time of admission) Independently performs ADLs?: Yes (appropriate for developmental age) Is the patient deaf or have difficulty hearing?: No Does the patient have difficulty seeing, even when wearing glasses/contacts?: No Does the patient have difficulty concentrating, remembering, or making decisions?: No  Permission Sought/Granted Permission sought to share information with : Facility Medical Sales Representative, Family Supports Permission granted to share information with : Yes, Verbal Permission Granted              Emotional Assessment Appearance:: Appears stated age Attitude/Demeanor/Rapport: Engaged Affect (typically observed): Accepting Orientation: : Oriented to Self, Oriented to Place, Oriented to  Time, Oriented to Situation Alcohol / Substance Use: Not Applicable Psych Involvement: No (comment)  Admission diagnosis:  Infection of prosthetic total shoulder joint, initial encounter (HCC) [T84.59XA, Z96.619] Patient Active Problem List   Diagnosis Date Noted   Infection of prosthetic total shoulder joint, initial encounter (HCC) 04/03/2023   History of revision of total shoulder arthroplasty 03/13/2023   Metabolic dysfunction-associated steatohepatitis (MASH) 12/24/2022   Arthropathy of cervical facet joint 09/11/2022   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   DDD (degenerative disc disease), cervical 09/11/2022   Viral URI 08/28/2022  Neck pain on left side 03/12/2022   Osteoarthritis of right knee 05/18/2021   Hx of adenomatous polyp of colon 04/04/2016   Cataract of both eyes 09/28/2015    Retinopathy, hypertensive, both eyes 09/28/2015   Glaucoma of both eyes 09/28/2015   Primary osteoarthritis of left knee 11/27/2014   INSOMNIA, CHRONIC 11/13/2009   Sleep apnea 09/20/2009   Low testosterone  08/14/2009   Osteoarthritis 06/07/2008   GEN OSTEOARTHROSIS INVOLVING MULTIPLE SITES 11/10/2007   Elevated LFTs 08/27/2007   GERD 01/01/2007   Essential hypertension 01/01/2007   PCP:  [name], MD Pharmacy:   [facility] [id number] - [place], [place] - [address] [place] [address] Phone: [phone] Fax: [phone]  [facility] Delivery - [place], [place] - [address] [place] [address] Phone: [phone] Fax: [phone]     Social Drivers of Health (SDOH) Social History: SDOH Screenings   Food Insecurity: No Food Insecurity (04/03/2023)  Housing: Low Risk  (04/03/2023)  Transportation Needs: No Transportation Needs (04/03/2023)  Utilities: Not At Risk (04/03/2023)  Alcohol Screen: Low Risk  (11/28/2022)  Depression (PHQ2-9): Low Risk  (11/28/2022)  Financial Resource Strain: Low Risk  (12/07/2022)   Received from [facility]  Physical Activity: Unknown (12/07/2022)   Received from [facility]  Recent Concern: Physical Activity - Inactive (11/28/2022)  Social Connections: Socially Integrated (04/03/2023)  Stress: No Stress Concern Present (11/28/2022)  Tobacco Use: Medium Risk (04/03/2023)  Health Literacy: Adequate Health Literacy (11/28/2022)   SDOH Interventions:     Readmission Risk Interventions    04/04/2023    2:48 PM  Readmission Risk Prevention Plan  Post Dischage Appt Complete  Medication Screening Complete  Transportation Screening Complete

## 2023-04-04 NOTE — Plan of Care (Signed)
   Problem: Activity: Goal: Risk for activity intolerance will decrease Outcome: Progressing   Problem: Pain Managment: Goal: General experience of comfort will improve and/or be controlled Outcome: Progressing   Problem: Safety: Goal: Ability to remain free from injury will improve Outcome: Progressing

## 2023-04-05 DIAGNOSIS — Z96619 Presence of unspecified artificial shoulder joint: Secondary | ICD-10-CM

## 2023-04-05 DIAGNOSIS — T8459XA Infection and inflammatory reaction due to other internal joint prosthesis, initial encounter: Secondary | ICD-10-CM

## 2023-04-05 LAB — HEPATITIS A ANTIBODY, TOTAL: hep A Total Ab: REACTIVE — AB

## 2023-04-05 LAB — HEPATITIS B SURFACE ANTIGEN: Hepatitis B Surface Ag: NONREACTIVE

## 2023-04-05 NOTE — Progress Notes (Addendum)
    Patient doing well PO Day 2 S/P L shoulder I&D by Dr. Dozier. We are pending final cultures an antibiotic recommendations by infectious disease.  Dr. Lindia saw the patient yesterday and ordered a PICC line and is hopeful for the patient to be discharged home in a couple of days pending establishment of an outpatient antibiotic regimen.  The patient's pain is well controlled at this venture.  He did have a drain in place, but it came out accidentally early this morning.  They believe this may have happened when they were changing his gown.  He did have some night sweats and sweat through his sling, which is currently drying.  He is comfortably laying in his hospital bed and states that he has little to no pain at this venture.  His PICC line has not yet been placed.  Physical Exam: Vitals:   04/04/23 2135 04/05/23 0641  BP: 124/76 131/68  Pulse: 72 69  Resp: 18 18  Temp: 98 F (36.7 C) 98 F (36.7 C)  SpO2: 97% 98%    Dressing in place, drain is no longer present.  He is sitting up comfortably in bed with his arm braced against his body and sling at bedside.  Distal compartments soft.  Distal pulses intact and patient able to move fingers hand wrist and elbow without difficulty NVI  POD #2 s/p revision left shoulder irrigation and debridement  - patient able to come out of sling several times a day for hand wrist and elbow motion.  No motion of the shoulder. - Drain was accidentally removed this morning, no need to replace - Maintain current dressing until discharge home, at which point a new dressing will be placed - PICC line placement per ID - Discharged home once home antibiotic regimen has been established by ID, likely early next week per ID - continue aspirin  81 mg and SCDs for VTE prophylaxis  - NWB LUE

## 2023-04-05 NOTE — Progress Notes (Signed)
 Pt family accidentally removed hemovac, no bleeding noted, RN reinforced the dressing.

## 2023-04-05 NOTE — Progress Notes (Signed)
 Brief ID note:  Cultures growing Staph aureus in all cultures.  Sensitivities pending.  Will monitor and once finalized, will plan on 6 weeks of IV treatment (daptomycin  or cefazolin )  Lina Render, MD

## 2023-04-06 LAB — HEPATITIS B SURFACE ANTIBODY, QUANTITATIVE: Hep B S AB Quant (Post): <3.5 m[IU]/mL — ABNORMAL LOW

## 2023-04-06 LAB — HCV INTERPRETATION

## 2023-04-06 LAB — HCV AB W REFLEX TO QUANT PCR: HCV Ab: NONREACTIVE

## 2023-04-06 MED ORDER — CEFAZOLIN SODIUM-DEXTROSE 2-4 GM/100ML-% IV SOLN
2.0000 g | Freq: Three times a day (TID) | INTRAVENOUS | Status: DC
  Administered 2023-04-06 – 2023-04-07 (×4): 2 g via INTRAVENOUS
  Filled 2023-04-06 (×4): qty 100

## 2023-04-06 NOTE — Plan of Care (Signed)
  Problem: Education: Goal: Knowledge of General Education information will improve Description: Including pain rating scale, medication(s)/side effects and non-pharmacologic comfort measures Outcome: Progressing   Problem: Clinical Measurements: Goal: Will remain free from infection Outcome: Progressing Goal: Diagnostic test results will improve Outcome: Progressing Goal: Respiratory complications will improve Outcome: Progressing Goal: Cardiovascular complication will be avoided Outcome: Progressing   Problem: Activity: Goal: Risk for activity intolerance will decrease Outcome: Progressing   Problem: Nutrition: Goal: Adequate nutrition will be maintained Outcome: Progressing   Problem: Coping: Goal: Level of anxiety will decrease Outcome: Progressing   Problem: Elimination: Goal: Will not experience complications related to bowel motility Outcome: Progressing Goal: Will not experience complications related to urinary retention Outcome: Progressing   Problem: Pain Managment: Goal: General experience of comfort will improve and/or be controlled Outcome: Progressing   Problem: Safety: Goal: Ability to remain free from injury will improve Outcome: Progressing   Problem: Skin Integrity: Goal: Risk for impaired skin integrity will decrease Outcome: Progressing

## 2023-04-06 NOTE — Progress Notes (Deleted)
 Pharmacy Antibiotic Note  Nathan Stewart is a 68 y.o. male admitted on 04/03/2023 with  shoulder PJI .  Pharmacy has been consulted for Dapto dosing.  ID:  shoulder PJI (purulent drainage PTA) -2/6 s/p extensive I&D w/ revision Afebrile, WBC 14.6 up , Scr <1  Dapto 2/6 >> CTX 2/6 >>  2/6 Fascial swab (A): Mod SA 2/6 Joint tissue (B): Few SA 2/6 Baseplate (C): Abundant SA  Plan: -CTX 2g/24h -Dapto 8mg /kg/day  -CK weekly on Fridays - 145 (2/7) Awaiting cultures/sens to determine Dapto vs Ancef  Will need OPAT for 6wks abx   Height: 6' 2 (188 cm) Weight: 103.4 kg (227 lb 15.3 oz) IBW/kg (Calculated) : 82.2  Temp (24hrs), Avg:98.1 F (36.7 C), Min:97.8 F (36.6 C), Max:98.4 F (36.9 C)  Recent Labs  Lab 04/03/23 0925 04/04/23 0355  WBC 10.9* 14.6*  CREATININE 0.87 0.84    Estimated Creatinine Clearance: 109.5 mL/min (by C-G formula based on SCr of 0.84 mg/dL).    Allergies  Allergen Reactions   Hydrocodone -Acetaminophen  Nausea And Vomiting   Irbesartan      Tingling, swelling  Pt unaware of allergy reaction    Percocet [Oxycodone -Acetaminophen ] Nausea And Vomiting   Shingrix [Zoster Vac Recomb Adjuvanted] Itching    Phyllip Claw S. Casimir, PharmD, BCPS Clinical Staff Pharmacist  Casimir Salines Stillinger 04/06/2023 9:46 AM

## 2023-04-06 NOTE — Progress Notes (Signed)
    Patient doing well PO Day 3 S/P L shoulder I&D by Dr. Dozier. We are pending final cultures and antibiotic recommendations by infectious disease. S. aureus growing and awaiting sensitivities. Pt still pending PICC line placement. The patient's pain is well controlled at this venture.  He did have a drain in place, but it came out accidentally Saturday morning.   He is comfortably laying in his hospital bed with his sling on and states that he has little to no pain at this venture.  Physical Exam: Vitals:   04/06/23 0541 04/06/23 1317  BP: (!) 142/73 (!) 146/73  Pulse: 70 72  Resp: 17 16  Temp: 98.2 F (36.8 C) 97.9 F (36.6 C)  SpO2: 97% 95%    Dressing in place, bilked up original. Sling in place, distal compartments soft, 2+ DRP = BIL, NVI   POD #3 s/p revision left shoulder irrigation and debridement by Dr Dozier   - patient able to come out of sling several times a day for hand wrist and elbow motion.  No motion of the shoulder. - Drain fell out accidentally Saturday, no need to replace - Maintain current dressing until discharge home, at which point a new dressing will be placed - PICC line placement per ID - Discharged home once home antibiotic regimen has been established by ID, likely early next week per ID - continue aspirin  81 mg and SCDs for VTE prophylaxis  - NWB LUE

## 2023-04-07 ENCOUNTER — Other Ambulatory Visit: Payer: Self-pay

## 2023-04-07 DIAGNOSIS — T8459XA Infection and inflammatory reaction due to other internal joint prosthesis, initial encounter: Secondary | ICD-10-CM | POA: Diagnosis not present

## 2023-04-07 DIAGNOSIS — Z96619 Presence of unspecified artificial shoulder joint: Secondary | ICD-10-CM | POA: Diagnosis not present

## 2023-04-07 MED ORDER — SODIUM CHLORIDE 0.9% FLUSH
10.0000 mL | INTRAVENOUS | Status: DC | PRN
  Administered 2023-04-07: 10 mL

## 2023-04-07 MED ORDER — CEFAZOLIN IV (FOR PTA / DISCHARGE USE ONLY): 2.0000 g | Freq: Three times a day (TID) | INTRAVENOUS | 0 refills | Status: AC

## 2023-04-07 MED ORDER — SODIUM CHLORIDE 0.9% FLUSH
10.0000 mL | Freq: Two times a day (BID) | INTRAVENOUS | Status: DC
  Administered 2023-04-07: 10 mL

## 2023-04-07 MED ORDER — HEPARIN SOD (PORK) LOCK FLUSH 100 UNIT/ML IV SOLN
250.0000 [IU] | INTRAVENOUS | Status: AC | PRN
  Administered 2023-04-07: 250 [IU]

## 2023-04-07 MED ORDER — CHLORHEXIDINE GLUCONATE CLOTH 2 % EX PADS: 6.0000 | MEDICATED_PAD | Freq: Every day | CUTANEOUS | Status: DC

## 2023-04-07 NOTE — Progress Notes (Signed)
 PATIENT ID: Nathan Stewart  MRN: [id number]  DOB/AGE:  68-Aug-1957 / 68 y.o.  4 Days Post-Op Procedure(s) (LRB): IRRIGATION AND DEBRIDEMENT SHOULDER (Left) SHOULDER REVISION (Left)  Subjective: Patient reports no pain in the left shoulder. Eager to go home. Voiding well and reports BM since surgery.   Objective: Vital signs in last 24 hours: Temp:  [97.9 F (36.6 C)-99.1 F (37.3 C)] 98.5 F (36.9 C) (02/10 0508) Pulse Rate:  [68-72] 68 (02/10 0508) Resp:  [16-18] 17 (02/10 0508) BP: (131-146)/(72-76) 136/72 (02/10 0508) SpO2:  [95 %-98 %] 98 % (02/10 0508)  Intake/Output from previous day: 02/09 0701 - 02/10 0700 In: 686.1 [P.O.:480; IV Piggyback:206.1] Out: 700 [Urine:700]  Physical Exam: Neurologically intact Sensation intact distally Intact pulses distally Incision: no drainage; dressing changed No cellulitis present Hand, wrist, elbow function intact Sling in place LUE  Assessment/Plan: 4 Days Post-Op Procedure(s) (LRB): IRRIGATION AND DEBRIDEMENT SHOULDER (Left) SHOULDER REVISION (Left)   POD #4 s/p revision left shoulder irrigation and debridement by Dr Deeann Fare   - patient able to come out of sling several times a day for hand wrist and elbow motion.  No motion of the shoulder. - Dressing changed-no active drainagae - Awaiting PICC line placement per ID - Discharged home once home antibiotic regimen has been established by ID, stable for DC per ortho - continue aspirin  81 mg and SCDs for VTE prophylaxis  - NWB LUE  Naz Denunzio L. Porterfield, PA-C 04/07/2023, 8:11 AM

## 2023-04-07 NOTE — Progress Notes (Signed)
 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  Taken to the OR 3 weeks ago for revision.  Postop course: Placated by drainage - Taken to the OR with Dr. Deeann Fare on 2/6 for I&D revision humeral and glenoid components, or cultures grew MSSA. -Plan on 6 weeks of IV antibiotics with cefazolin  followed by p.o. suppression Recommendations: -Place PICC -Continue cefazolin  to complete 6 weeks antibiotics from OR EOT 05/15/23 followed by p.o. suppression(communicated with Dr. Deeann Fare -not all hardware out). Discussed plan with pt and family at bedside. - Follow-up with Dr. Fontaine Ice on 04/30/2023  OPAT ORDERS:  Diagnosis: MSSA PJI of left shoulder  Culture Result: MSSA  Allergies  Allergen Reactions   Hydrocodone -Acetaminophen  Nausea And Vomiting   Irbesartan      Tingling, swelling  Pt unaware of allergy reaction    Percocet [Oxycodone -Acetaminophen ] Nausea And Vomiting   Shingrix [Zoster Vac Recomb Adjuvanted] Itching     Discharge antibiotics to be given via PICC line:  Per pharmacy protocol Cefazolin  2 gm IV Q 8 hours   Duration: 6 weeks End Date: 05/15/23  [facility] Care Per Protocol with Biopatch Use: Home health RN for IV administration and teaching, line care and labs.    Labs weekly while on IV antibiotics: _x_ CBC with differential __ BMP **TWICE WEEKLY ON VANCOMYCIN   _x_ CMP _x_ CRP _x_ ESR __ Vancomycin  trough TWICE WEEKLY __ CK  __ Please pull PIC at completion of IV antibiotics _x_ Please leave PIC in place until doctor has seen patient or been  notified  Fax weekly labs to [phone]  Clinic Follow Up Appt: 04/30/23  @ [facility] with dr Carloyn Chi dam  Microbiology:   Antibiotics: Ceftriaxone  2/6-2/8 Daptomyciin 2/6-2/8 Cefazolin  2/9- Cultures: Blood  Urine  Other 2/6 3/3 MSSA  SUBJECTIVE: Restinin bed.  Interval: Afebrile overnight  Review of Systems: Review of Systems  All other systems reviewed and are negative.    Scheduled Meds:  amLODipine   5 mg Oral Daily   Chlorhexidine  Gluconate Cloth  6 each Topical Daily   docusate sodium   100 mg Oral BID   hydrochlorothiazide   25 mg Oral Daily   metoprolol  succinate  100 mg Oral Daily   sodium chloride  flush  10-40 mL Intracatheter Q12H   Continuous Infusions:   ceFAZolin  (ANCEF ) IV 2 g (04/07/23 1249)   PRN Meds:.acetaminophen , ALPRAZolam , diphenhydrAMINE , HYDROmorphone  (DILAUDID ) injection, methocarbamol  **OR** methocarbamol  (ROBAXIN ) injection, ondansetron  **OR** ondansetron  (ZOFRAN ) IV, oxyCODONE , oxyCODONE , polyethylene glycol, sodium chloride  flush Allergies  Allergen Reactions   Hydrocodone -Acetaminophen  Nausea And Vomiting   Irbesartan      Tingling, swelling  Pt unaware of allergy reaction    Percocet [Oxycodone -Acetaminophen ] Nausea And Vomiting   Shingrix [Zoster Vac Recomb Adjuvanted] Itching    OBJECTIVE: Vitals:   04/06/23 1317 04/06/23 2016 04/07/23 0508 04/07/23 1316  BP: (!) 146/73 131/76 136/72 133/83  Pulse: 72 70 68 75  Resp: 16 18 17 16   Temp: 97.9 F (36.6 C) 99.1 F (37.3 C) 98.5 F (36.9 C) 98.2 F (36.8 C)  TempSrc:  Oral Oral   SpO2:  95% 97% 98% 98%  Weight:      Height:       Body mass index is 29.27 kg/m.  Physical Exam Constitutional:      General: He is not in acute distress.    Appearance: He is normal weight. He is not toxic-appearing.  HENT:     Head: Normocephalic and atraumatic.     Right Ear: External ear normal.     Left Ear: External ear normal.     Nose: No congestion or rhinorrhea.     Mouth/Throat:      Mouth: Mucous membranes are moist.     Pharynx: Oropharynx is clear.  Eyes:     Extraocular Movements: Extraocular movements intact.     Conjunctiva/sclera: Conjunctivae normal.     Pupils: Pupils are equal, round, and reactive to light.  Cardiovascular:     Rate and Rhythm: Normal rate and regular rhythm.     Heart sounds: No murmur heard.    No friction rub. No gallop.  Pulmonary:     Effort: Pulmonary effort is normal.     Breath sounds: Normal breath sounds.  Abdominal:     General: Abdomen is flat. Bowel sounds are normal.     Palpations: Abdomen is soft.  Musculoskeletal:        General: No swelling. Normal range of motion.     Cervical back: Normal range of motion and neck supple.  Skin:    General: Skin is warm and dry.  Neurological:     General: No focal deficit present.     Mental Status: He is oriented to person, place, and time.  Psychiatric:        Mood and Affect: Mood normal.       Lab Results Lab Results  Component Value Date   WBC 14.6 (H) 04/04/2023   HGB 10.4 (L) 04/04/2023   HCT 31.4 (L) 04/04/2023   MCV 89.7 04/04/2023   PLT 279 04/04/2023    Lab Results  Component Value Date   CREATININE 0.84 04/04/2023   BUN 15 04/04/2023   NA 132 (L) 04/04/2023   K 4.0 04/04/2023   CL 99 04/04/2023   CO2 21 (L) 04/04/2023    Lab Results  Component Value Date   ALT 33 04/04/2023   AST 24 04/04/2023   ALKPHOS 60 04/04/2023   BILITOT 0.6 04/04/2023        Orlie Bjornstad, MD [facility] 04/07/2023, 3:10 PM   I have personally spent 52 minutes involved in face-to-face and non-face-to-face activities for this patient on the day of the visit. Professional time spent includes the following activities: Preparing to see the patient (review of tests), Obtaining and/or reviewing separately obtained history (admission/discharge record), Performing a medically appropriate examination and/or evaluation ,  Ordering medications/tests/procedures, referring and communicating with other health care professionals, Documenting clinical information in the EMR, Independently interpreting results (not separately reported), Communicating results to the patient/family/caregiver, Counseling and educating the patient/family/caregiver and Care coordination (not separately reported).

## 2023-04-07 NOTE — Progress Notes (Signed)
 Peripherally Inserted Central Catheter Placement  The IV Nurse has discussed with the patient and/or persons authorized to consent for the patient, the purpose of this procedure and the potential benefits and risks involved with this procedure.  The benefits include less needle sticks, lab draws from the catheter, and the patient may be discharged home with the catheter. Risks include, but not limited to, infection, bleeding, blood clot (thrombus formation), and puncture of an artery; nerve damage and irregular heartbeat and possibility to perform a PICC exchange if needed/ordered by physician.  Alternatives to this procedure were also discussed.  Bard Power PICC patient education guide, fact sheet on infection prevention and patient information card has been provided to patient /or left at bedside.    PICC Placement Documentation  PICC Single Lumen 04/07/23 Right Brachial 41 cm 0 cm (Active)  Indication for Insertion or Continuance of Line Home intravenous therapies (PICC only) 04/07/23 1152  Exposed Catheter (cm) 0 cm 04/07/23 1152  Site Assessment Clean, Dry, Intact 04/07/23 1152  Line Status Flushed;Blood return noted;Saline locked 04/07/23 1152  Dressing Type Transparent 04/07/23 1152  Dressing Status Antimicrobial disc/dressing in place 04/07/23 1152  Line Care Connections checked and tightened 04/07/23 1152  Line Adjustment (NICU/IV Team Only) No 04/07/23 1152  Dressing Intervention New dressing 04/07/23 1152  Dressing Change Due 04/14/23 04/07/23 1152       Nathan Stewart 04/07/2023, 11:53 AM

## 2023-04-07 NOTE — Progress Notes (Signed)
 PHARMACY CONSULT NOTE FOR:  OUTPATIENT  PARENTERAL ANTIBIOTIC THERAPY (OPAT)  Indication: MSSA PJI of left shoulder Regimen: Cefazolin  2 gm IV Q 8 hours End date: 05/15/23  IV antibiotic discharge orders are pended. To discharging provider:  please sign these orders via discharge navigator,  Select New Orders & click on the button choice - Manage This Unsigned Work.     Thank you for allowing pharmacy to be a part of this patient's care.  Denson Flake, PharmD, BCPS, BCIDP Infectious Diseases Clinical Pharmacist Phone: [phone] 04/07/2023, 10:41 AM

## 2023-04-07 NOTE — Plan of Care (Signed)
°  Problem: Education: °Goal: Knowledge of General Education information will improve °Description: Including pain rating scale, medication(s)/side effects and non-pharmacologic comfort measures °Outcome: Progressing °  °Problem: Health Behavior/Discharge Planning: °Goal: Ability to manage health-related needs will improve °Outcome: Progressing °  °Problem: Clinical Measurements: °Goal: Ability to maintain clinical measurements within normal limits will improve °Outcome: Progressing °Goal: Respiratory complications will improve °Outcome: Progressing °Goal: Cardiovascular complication will be avoided °Outcome: Progressing °  °Problem: Activity: °Goal: Risk for activity intolerance will decrease °Outcome: Progressing °  °Problem: Nutrition: °Goal: Adequate nutrition will be maintained °Outcome: Progressing °  °

## 2023-04-08 LAB — AEROBIC/ANAEROBIC CULTURE W GRAM STAIN (SURGICAL/DEEP WOUND)
Gram Stain: NONE SEEN
Gram Stain: NONE SEEN

## 2023-04-09 DIAGNOSIS — T8459XA Infection and inflammatory reaction due to other internal joint prosthesis, initial encounter: Secondary | ICD-10-CM | POA: Diagnosis not present

## 2023-04-09 DIAGNOSIS — Z96619 Presence of unspecified artificial shoulder joint: Secondary | ICD-10-CM | POA: Diagnosis not present

## 2023-04-15 NOTE — Discharge Summary (Signed)
 Patient ID: Nathan Stewart MRN: [id number] DOB/AGE: 04-07-1955 68 y.o.  Admit date: 04/03/2023 Discharge date: 04/07/2023  Admission Diagnoses:  Principal Problem:   Infection of prosthetic total shoulder joint, initial encounter [facility])   Discharge Diagnoses:  Same  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   GEN OSTEOARTHROSIS INVOLVING MULTIPLE SITES 11/10/2007   GERD 01/01/2007   Hx of adenomatous polyp of colon 04/04/2016   Hypertension    INSOMNIA, CHRONIC 11/13/2009   LIVER FUNCTION TESTS, ABNORMAL 08/27/2007   LOC OSTEOARTHROS NOT SPEC PRIM/SEC LOWER LEG 11/10/2007   Memory loss 08/27/2007   MENISCUS TEAR 10/30/2006   OSTEOARTHRITIS 06/07/2008   Pneumonia    SLEEP APNEA 09/20/2009   no cpap   TESTICULAR HYPOFUNCTION 08/14/2009    Surgeries: Procedure(s): IRRIGATION AND DEBRIDEMENT SHOULDER SHOULDER REVISION on 04/03/2023   Consultants:   Discharged Condition: Improved  Hospital Course: Nathan Stewart is an 68 y.o. male who was admitted 04/03/2023 for operative treatment ofInfection of prosthetic total shoulder joint, initial encounter (HCC). Patient has severe unremitting pain that affects sleep, daily activities, and work/hobbies. After pre-op clearance the patient was taken to the operating room on 04/03/2023 and underwent  Procedure(s): IRRIGATION AND DEBRIDEMENT SHOULDER SHOULDER REVISION.    Patient was given perioperative antibiotics:  Anti-infectives (From admission, onward)    Start     Dose/Rate Route Frequency Ordered Stop   04/07/23 0000  ceFAZolin (ANCEF) IVPB        2 g Intravenous Every 8 hours 04/07/23 1043 05/15/23 2359   04/06/23 1400  ceFAZolin (ANCEF) IVPB 2g/100 mL premix  Status:  Discontinued        2 g 200 mL/hr over 30 Minutes Intravenous Every 8 hours 04/06/23 1225 04/07/23 2133   04/03/23 1700  DAPTOmycin (CUBICIN) 800 mg in sodium chloride 0.9 % IVPB  Status:  Discontinued         8 mg/kg  103.4 kg 132 mL/hr over 30 Minutes Intravenous Daily 04/03/23 1646 04/06/23 1210   04/03/23 1700  cefTRIAXone (ROCEPHIN) 2 g in sodium chloride 0.9 % 100 mL IVPB  Status:  Discontinued        2 g 200 mL/hr over 30 Minutes Intravenous Every 24 hours 04/03/23 1647 04/06/23 1210   04/03/23 1300  vancomycin (VANCOCIN) powder  Status:  Discontinued          As needed 04/03/23 1311 04/03/23 1418   04/03/23 1150  vancomycin (VANCOCIN) 1-5 GM/200ML-% IVPB       Note to Pharmacy: Minor, Anneita S: cabinet override      04/03/23 1150 04/03/23 2359        Patient was given sequential compression devices, early ambulation, and chemoprophylaxis to prevent DVT.  Patient benefited maximally from hospital stay and there were no complications.  ID was consulted and worked with pharmacy to determine appropriate antibiotic coverage. Patient had PICC line placed and sent home on IV antibiotics.     Discharge Medications:   Allergies as of 04/07/2023       Reactions   Hydrocodone-acetaminophen Nausea And Vomiting   Irbesartan    Tingling, swelling Pt unaware of allergy reaction    Percocet [oxycodone-acetaminophen] Nausea And Vomiting   Shingrix [zoster Vac Recomb Adjuvanted] Itching        Medication List     TAKE these medications    ALPRAZolam 0.5 MG tablet Commonly known as: XANAX TAKE 1 TABLET(0.5 MG) BY MOUTH  THREE TIMES DAILY AS NEEDED FOR ANXIETY   amLODipine 5 MG tablet Commonly known as: NORVASC TAKE 1 TABLET(5 MG) BY MOUTH DAILY   ceFAZolin IVPB Commonly known as: ANCEF Inject 2 g into the vein every 8 (eight) hours. Indication:  MSSA PJI of left shoulder First Dose: Yes Last Day of Therapy:  05/15/23  Labs - Once weekly:  CBC/D and BMP, Labs - Once weekly: ESR and CRP Method of administration: IV Push Method of administration may be changed at the discretion of home infusion pharmacist based upon assessment of the patient and/or caregiver's ability to  self-administer the medication ordered.   ELDERBERRY PO Take 1 tablet by mouth daily.   Fish Oil 1000 MG Cpdr Take 2,000 mg by mouth daily.   Garlic 1000 MG Caps Take 1,000 mg by mouth daily.   hydrochlorothiazide 25 MG tablet Commonly known as: HYDRODIURIL TAKE 1 TABLET(25 MG) BY MOUTH DAILY   Magnesium 250 MG Tabs Take 250 mg by mouth daily.   methocarbamol 500 MG tablet Commonly known as: ROBAXIN Take 1 tablet (500 mg total) by mouth every 6 (six) hours as needed for muscle spasms.   metoprolol succinate 100 MG 24 hr tablet Commonly known as: TOPROL-XL TAKE 1 TABLET BY MOUTH EVERY DAY WITH OR IMMEDIATELY FOLLOWING A MEAL   multivitamin tablet Take 1 tablet by mouth daily. Adult 50+   ondansetron 4 MG tablet Commonly known as: ZOFRAN Take 1 tablet (4 mg total) by mouth every 6 (six) hours as needed for nausea.   oxyCODONE 5 MG immediate release tablet Commonly known as: Oxy IR/ROXICODONE Take 1 tablet (5 mg total) by mouth every 6 (six) hours as needed for moderate pain (pain score 4-6).   vitamin C 250 MG tablet Commonly known as: ASCORBIC ACID Take 250 mg by mouth daily.               Discharge Care Instructions  (From admission, onward)           Start     Ordered   04/07/23 0000  Change dressing on IV access line weekly and PRN  (Home infusion instructions - [facility] )        04/07/23 1043            Diagnostic Studies: [place] EKG SITE RITE Result Date: 04/07/2023 If Site Rite image not attached, placement could not be confirmed due to current cardiac rhythm.   Disposition: Discharge disposition: 01-Home or Self Care       Discharge Instructions     [facility] pharmacist to adjust dose for Vancomycin, Aminoglycosides and other anti-infective therapies as requested by physician.   Complete by: As directed    [facility] to provide Cath Flo 2mg    Complete by: As directed    Administer for PICC line  occlusion and as ordered by physician for other access device issues.   Anaphylaxis Kit: Provided to treat any anaphylactic reaction to the medication being provided to the patient if First Dose or when requested by physician   Complete by: As directed    Epinephrine 1mg /ml vial / amp: Administer 0.3mg  (0.2ml) subcutaneously once for moderate to severe anaphylaxis, nurse to call physician and pharmacy when reaction occurs and call 911 if needed for immediate care   Diphenhydramine 50mg /ml IV vial: Administer 25-50mg  IV/IM PRN for first dose reaction, rash, itching, mild reaction, nurse to call physician and pharmacy when reaction occurs   Sodium Chloride 0.9% NS  IV: Administer if needed for hypovolemic blood pressure drop or as ordered by physician after call to physician with anaphylactic reaction   Change dressing on IV access line weekly and PRN   Complete by: As directed    Flush IV access with Sodium Chloride 0.9% and Heparin 10 units/ml or 100 units/ml   Complete by: As directed    Home infusion instructions - [facility]   Complete by: As directed    Instructions: Flush IV access with Sodium Chloride 0.9% and Heparin 10units/ml or 100units/ml   Change dressing on IV access line: Weekly and PRN   Instructions Cath Flo 2mg : Administer for PICC Line occlusion and as ordered by physician for other access device   [facility] pharmacist to adjust dose for: Vancomycin, Aminoglycosides and other anti-infective therapies as requested by physician   Method of administration may be changed at the discretion of home infusion pharmacist based upon assessment of the patient and/or caregiver's ability to self-administer the medication ordered   Complete by: As directed         Follow-up Information     [place], [facility], PA-C. Schedule an appointment as soon as possible for a visit on 04/17/2023.   Specialty: Orthopedic Surgery Contact information: [address] [place] [address] [phone]         [facility] Follow up.   Why: [facility] to provide IV antibiotics and home health nursing                 Signed: Aylin Rhoads L. [place], PA-C 04/15/2023, 11:43 AM

## 2023-04-17 DIAGNOSIS — T8459XA Infection and inflammatory reaction due to other internal joint prosthesis, initial encounter: Secondary | ICD-10-CM | POA: Diagnosis not present

## 2023-04-17 DIAGNOSIS — Z96619 Presence of unspecified artificial shoulder joint: Secondary | ICD-10-CM | POA: Diagnosis not present

## 2023-04-18 DIAGNOSIS — T8459XA Infection and inflammatory reaction due to other internal joint prosthesis, initial encounter: Secondary | ICD-10-CM | POA: Diagnosis not present

## 2023-04-18 DIAGNOSIS — Z96619 Presence of unspecified artificial shoulder joint: Secondary | ICD-10-CM | POA: Diagnosis not present

## 2023-04-18 DIAGNOSIS — M25512 Pain in left shoulder: Secondary | ICD-10-CM | POA: Diagnosis not present

## 2023-04-24 DIAGNOSIS — Z96619 Presence of unspecified artificial shoulder joint: Secondary | ICD-10-CM | POA: Diagnosis not present

## 2023-04-24 DIAGNOSIS — T8459XA Infection and inflammatory reaction due to other internal joint prosthesis, initial encounter: Secondary | ICD-10-CM | POA: Diagnosis not present

## 2023-04-25 DIAGNOSIS — Z96619 Presence of unspecified artificial shoulder joint: Secondary | ICD-10-CM | POA: Diagnosis not present

## 2023-04-25 DIAGNOSIS — T8459XA Infection and inflammatory reaction due to other internal joint prosthesis, initial encounter: Secondary | ICD-10-CM | POA: Diagnosis not present

## 2023-04-29 ENCOUNTER — Encounter: Payer: Self-pay | Admitting: Infectious Disease

## 2023-04-29 DIAGNOSIS — A4901 Methicillin susceptible Staphylococcus aureus infection, unspecified site: Secondary | ICD-10-CM

## 2023-04-29 HISTORY — DX: Methicillin susceptible Staphylococcus aureus infection, unspecified site: A49.01

## 2023-04-30 ENCOUNTER — Encounter: Payer: Self-pay | Admitting: Infectious Disease

## 2023-04-30 ENCOUNTER — Ambulatory Visit: Payer: Medicare Other | Admitting: Infectious Disease

## 2023-04-30 ENCOUNTER — Other Ambulatory Visit: Payer: Self-pay

## 2023-04-30 VITALS — BP 144/81 | HR 79 | Temp 97.9°F | Wt 240.0 lb

## 2023-04-30 DIAGNOSIS — Z96612 Presence of left artificial shoulder joint: Secondary | ICD-10-CM

## 2023-04-30 DIAGNOSIS — T8459XA Infection and inflammatory reaction due to other internal joint prosthesis, initial encounter: Secondary | ICD-10-CM

## 2023-04-30 DIAGNOSIS — Z5181 Encounter for therapeutic drug level monitoring: Secondary | ICD-10-CM

## 2023-04-30 DIAGNOSIS — Z7185 Encounter for immunization safety counseling: Secondary | ICD-10-CM

## 2023-04-30 DIAGNOSIS — Z1211 Encounter for screening for malignant neoplasm of colon: Secondary | ICD-10-CM

## 2023-04-30 DIAGNOSIS — A4901 Methicillin susceptible Staphylococcus aureus infection, unspecified site: Secondary | ICD-10-CM

## 2023-04-30 MED ORDER — CEFADROXIL 500 MG PO CAPS
1000.0000 mg | ORAL_CAPSULE | Freq: Two times a day (BID) | ORAL | 9 refills | Status: AC
Start: 2023-04-30 — End: ?

## 2023-04-30 NOTE — Progress Notes (Signed)
 Subjective:  Chief Complaint follow-up for prosthetic joint infection  Patient ID: Nathan Stewart, male    DOB: December 17, 1955, 68 y.o.   MRN: [id number]  HPI  Discussed the use of AI scribe software for clinical note transcription with the patient, who gave verbal consent to proceed.  History of Present Illness   The patient, with a history of reverse shoulder surgery, presents for a follow-up visit after he shoulder got infected post-operatively. The infection was identified early, with pus noted at the surgical site. The infection was caused by a methicilin staph aureus, and the patient is currently on cefazolin three times a day via a PICC line. The patient reports no pain in the shoulder, despite having had three surgeries on it. He is not currently undergoing therapy for the shoulder. The patient has been managing any discomfort with Tylenol since the surgery. The patient is able to perform normal activities and has been working on shoulder mobility, including table slides and reaching up to his ear. The patient also mentions a scheduled colonoscopy and expresses concern about whether it is safe to proceed given his current infection.       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 04/29/2023   OSTEOARTHRITIS 06/07/2008   Pneumonia    SLEEP APNEA 09/20/2009   no cpap   TESTICULAR HYPOFUNCTION 08/14/2009    Past Surgical History:  Procedure Laterality Date   COLONOSCOPY     HAND SURGERY  over 20 years ago   'metal plate in wrist'    IRRIGATION AND DEBRIDEMENT SHOULDER Left 04/03/2023   Procedure: IRRIGATION AND DEBRIDEMENT SHOULDER;  Surgeon: Jones Broom, MD;  Location: [facility];  Service: Orthopedics;  Laterality: Left;   JOINT REPLACEMENT N/A    [employer] 10/19/2019   MULTIPLE TOOTH EXTRACTIONS     SHOULDER SURGERY Right 2021   ligament damage   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   Procedure: TOTAL KNEE ARTHROPLASTY;  Surgeon: Gean Birchwood, MD;  Location: [facility];  Service: Orthopedics;  Laterality: Left;   TOTAL KNEE ARTHROPLASTY Right 05/21/2021   Procedure: RIGHT TOTAL KNEE ARTHROPLASTY;  Surgeon: Gean Birchwood, MD;  Location: [facility];  Service: Orthopedics;  Laterality: Right;   TOTAL SHOULDER REVISION Left 03/11/2023   Procedure: LEFT SHOULDER REVISION GLENOID COMPONENT;  Surgeon: Jones Broom, MD;  Location: [facility];  Service: Orthopedics;  Laterality: Left;   TOTAL SHOULDER REVISION Left 04/03/2023   Procedure: SHOULDER REVISION;  Surgeon: Jones Broom, MD;  Location: [facility];  Service: Orthopedics;  Laterality: Left;    Family History  Problem Relation Age of Onset   Arthritis Mother    Heart disease Mother    Hypertension Mother    Hypertension Father    Stroke Father    Hypertension Sister    Stroke Brother    Colon cancer Paternal Uncle       Social History   Socioeconomic History   Marital status: Married    Spouse name: Not on file   Number of children: Not on file   Years of  education: Not on file   Highest education level: Not on file  Occupational History   Not on file  Tobacco Use   Smoking status: Former    Current packs/day: 0.00    Types: Cigarettes    Quit date: 02/25/1997    Years since quitting: 26.1    Passive exposure: Current   Smokeless tobacco: Never   Tobacco comments:    quit 1999  Vaping Use   Vaping status: Never Used  Substance and Sexual Activity   Alcohol use: Yes    Alcohol/week: 6.0 standard drinks of alcohol    Types: 6 Cans of beer per week     Comment: occasional   Drug use: No   Sexual activity: Yes  Other Topics Concern   Not on file  Social History Narrative   Not on file   Social Drivers of Health   Financial Resource Strain: Low Risk  (12/07/2022)   Received from [facility]   Overall Financial Resource Strain (CARDIA)    Difficulty of Paying Living Expenses: Not hard at all  Food Insecurity: No Food Insecurity (04/03/2023)   Hunger Vital Sign    Worried About Running Out of Food in the Last Year: Never true    Ran Out of Food in the Last Year: Never true  Transportation Needs: No Transportation Needs (04/03/2023)   PRAPARE - Administrator, Civil Service (Medical): No    Lack of Transportation (Non-Medical): No  Physical Activity: Unknown (12/07/2022)   [facility] from [facility]   Exercise Vital Sign    Days of Exercise per Week: 3 days    Minutes of Exercise per Session: Not on file  Recent Concern: Physical Activity - Inactive (11/28/2022)   Exercise Vital Sign    Days of Exercise per Week: 0 days    Minutes of Exercise per Session: 0 min  Stress: No Stress Concern Present (11/28/2022)   [employer] of Occupational Health - Occupational Stress Questionnaire    Feeling of Stress : Not at all  Social Connections: Socially Integrated (04/03/2023)   Social Connection and Isolation Panel [NHANES]    Frequency of Communication with Friends and Family: More than three times a week    Frequency of Social Gatherings with Friends and Family: Three times a week    Attends Religious Services: More than 4 times per year    Active Member of Clubs or Organizations: Yes    Attends Engineer, structural: More than 4 times per year    Marital Status: Married    Allergies  Allergen Reactions   Hydrocodone-Acetaminophen Nausea And Vomiting   Irbesartan     Tingling, swelling  Pt unaware of allergy reaction    Percocet [Oxycodone-Acetaminophen] Nausea And Vomiting   Shingrix [Zoster Vac Recomb  Adjuvanted] Itching     Current Outpatient Medications:    ALPRAZolam (XANAX) 0.5 MG tablet, TAKE 1 TABLET(0.5 MG) BY MOUTH THREE TIMES DAILY AS NEEDED FOR ANXIETY, Disp: 30 tablet, Rfl: 3   amLODipine (NORVASC) 5 MG tablet, TAKE 1 TABLET(5 MG) BY MOUTH DAILY, Disp: 90 tablet, Rfl: 3   ceFAZolin (ANCEF) IVPB, Inject 2 g into the vein every 8 (eight) hours. Indication:  MSSA PJI of left shoulder First Dose: Yes Last Day of Therapy:  05/15/23  Labs - Once weekly:  CBC/D and BMP, Labs - Once weekly: ESR and CRP Method of administration: IV Push Method of administration may be changed at the discretion of home infusion pharmacist based  upon assessment of the patient and/or caregiver's ability to self-administer the medication ordered., Disp: 114 Units, Rfl: 0   ELDERBERRY PO, Take 1 tablet by mouth daily., Disp: , Rfl:    Garlic 1000 MG CAPS, Take 1,000 mg by mouth daily., Disp: , Rfl:    hydrochlorothiazide (HYDRODIURIL) 25 MG tablet, TAKE 1 TABLET(25 MG) BY MOUTH DAILY, Disp: 90 tablet, Rfl: 1   Magnesium 250 MG TABS, Take 250 mg by mouth daily., Disp: , Rfl:    methocarbamol (ROBAXIN) 500 MG tablet, Take 1 tablet (500 mg total) by mouth every 6 (six) hours as needed for muscle spasms., Disp: 20 tablet, Rfl: 0   metoprolol succinate (TOPROL-XL) 100 MG 24 hr tablet, TAKE 1 TABLET BY MOUTH EVERY DAY WITH OR IMMEDIATELY FOLLOWING A MEAL, Disp: 90 tablet, Rfl: 1   Multiple Vitamin (MULTIVITAMIN) tablet, Take 1 tablet by mouth daily. Adult 50+, Disp: , Rfl:    Omega-3 Fatty Acids (FISH OIL) 1000 MG CPDR, Take 2,000 mg by mouth daily., Disp: , Rfl:    ondansetron (ZOFRAN) 4 MG tablet, Take 1 tablet (4 mg total) by mouth every 6 (six) hours as needed for nausea., Disp: 20 tablet, Rfl: 0   oxyCODONE (OXY IR/ROXICODONE) 5 MG immediate release tablet, Take 1 tablet (5 mg total) by mouth every 6 (six) hours as needed for moderate pain (pain score 4-6)., Disp: 20 tablet, Rfl: 0   vitamin C (ASCORBIC ACID) 250 MG  tablet, Take 250 mg by mouth daily., Disp: , Rfl:   Current Facility-Administered Medications:    0.9 %  sodium chloride infusion, 500 mL, Intravenous, Continuous, Iva Boop, MD   Review of Systems  Constitutional:  Negative for activity change, appetite change, chills, diaphoresis, fatigue, fever and unexpected weight change.  HENT:  Negative for congestion, rhinorrhea, sinus pressure, sneezing, sore throat and trouble swallowing.   Eyes:  Negative for photophobia and visual disturbance.  Respiratory:  Negative for cough, chest tightness, shortness of breath, wheezing and stridor.   Cardiovascular:  Negative for chest pain, palpitations and leg swelling.  Gastrointestinal:  Negative for abdominal distention, abdominal pain, anal bleeding, blood in stool, constipation, diarrhea, nausea and vomiting.  Genitourinary:  Negative for difficulty urinating, dysuria, flank pain and hematuria.  Musculoskeletal:  Negative for arthralgias, back pain, gait problem, joint swelling and myalgias.  Skin:  Negative for color change, pallor, rash and wound.  Neurological:  Negative for dizziness, tremors, weakness and light-headedness.  Hematological:  Negative for adenopathy. Does not bruise/bleed easily.  Psychiatric/Behavioral:  Negative for agitation, behavioral problems, confusion, decreased concentration, dysphoric mood and sleep disturbance.        Objective:   Physical Exam Constitutional:      Appearance: He is well-developed.  HENT:     Head: Normocephalic and atraumatic.  Eyes:     Conjunctiva/sclera: Conjunctivae normal.  Cardiovascular:     Rate and Rhythm: Normal rate and regular rhythm.  Pulmonary:     Effort: Pulmonary effort is normal. No respiratory distress.     Breath sounds: No wheezing.  Abdominal:     General: There is no distension.     Palpations: Abdomen is soft.  Musculoskeletal:        General: No tenderness. Normal range of motion.     Cervical back: Normal  range of motion and neck supple.  Skin:    General: Skin is warm and dry.     Coloration: Skin is not pale.     Findings: No erythema  or rash.  Neurological:     General: No focal deficit present.     Mental Status: He is alert and oriented to person, place, and time.  Psychiatric:        Mood and Affect: Mood normal.        Behavior: Behavior normal.        Thought Content: Thought content normal.        Judgment: Judgment normal.           Assessment & Plan:   Assessment and Plan    Prosthetic Joint Infection (Left Shoulder) Early infection post-revision surgery with Staphylococcus aureus. Currently on IV Cefazolin via PICC line, with no reported pain. PICC line site appears clean and well-maintained. -Continue Cefazolin IV three times daily until 05/25/2023. -Transition to oral antibiotics (Cephalexin 1000mg  twice daily) after completion of IV course, for a total duration of one year. -Check inflammatory markers periodically to monitor response to treatment. -Schedule follow-up appointment in two months.  Colonoscopy Scheduled for next Friday. Patient has concerns about undergoing the procedure while being treated for an active infection. -Advise patient to communicate with the gastroenterologist performing the procedure regarding THEIR policies re infection. I personallyDO NOT have concern about safety of colonoscopy in this patient with stable infection that is controlled with antibiotics on board  Vaccinations Patient has received the flu vaccine but not the COVID-19 vaccine for the current year. -Offer COVID-19 vaccine at the clinic today.      TDM labs from home health reviewed

## 2023-05-01 ENCOUNTER — Telehealth: Payer: Self-pay

## 2023-05-01 DIAGNOSIS — Z96619 Presence of unspecified artificial shoulder joint: Secondary | ICD-10-CM | POA: Diagnosis not present

## 2023-05-01 DIAGNOSIS — T8459XA Infection and inflammatory reaction due to other internal joint prosthesis, initial encounter: Secondary | ICD-10-CM | POA: Diagnosis not present

## 2023-05-01 NOTE — Telephone Encounter (Signed)
 Per DR. [facility] okay for picc to be pulled after last dose. Community message forwarded to [employer] team to update nursing with pull picc orders. End date 05/25/23 Juanita Laster, RMA

## 2023-05-02 ENCOUNTER — Other Ambulatory Visit (HOSPITAL_COMMUNITY)
Admission: RE | Admit: 2023-05-02 | Discharge: 2023-05-02 | Disposition: A | Discharge status: Home / Self Care | Source: Ambulatory Visit | Attending: Gastroenterology | Admitting: Gastroenterology

## 2023-05-02 DIAGNOSIS — Z09 Encounter for follow-up examination after completed treatment for conditions other than malignant neoplasm: Secondary | ICD-10-CM | POA: Diagnosis not present

## 2023-05-02 DIAGNOSIS — Z860101 Personal history of adenomatous and serrated colon polyps: Secondary | ICD-10-CM | POA: Diagnosis not present

## 2023-05-02 DIAGNOSIS — Z96619 Presence of unspecified artificial shoulder joint: Secondary | ICD-10-CM | POA: Diagnosis not present

## 2023-05-02 DIAGNOSIS — T8459XA Infection and inflammatory reaction due to other internal joint prosthesis, initial encounter: Secondary | ICD-10-CM | POA: Diagnosis not present

## 2023-05-02 LAB — BASIC METABOLIC PANEL
Anion gap: 14 (ref 5–15)
BUN: 16 mg/dL (ref 8–23)
CO2: 21 mmol/L — ABNORMAL LOW (ref 22–32)
Calcium: 10.1 mg/dL (ref 8.9–10.3)
Chloride: 99 mmol/L (ref 98–111)
Creatinine, Ser: 0.85 mg/dL (ref 0.61–1.24)
GFR, Estimated: >60 mL/min (ref >60)
Glucose, Bld: 187 mg/dL — ABNORMAL HIGH (ref 70–99)
Potassium: 3.7 mmol/L (ref 3.5–5.1)
Sodium: 134 mmol/L — ABNORMAL LOW (ref 135–145)

## 2023-05-05 DIAGNOSIS — M47812 Spondylosis without myelopathy or radiculopathy, cervical region: Secondary | ICD-10-CM | POA: Diagnosis not present

## 2023-05-05 DIAGNOSIS — M5030 Other cervical disc degeneration, unspecified cervical region: Secondary | ICD-10-CM | POA: Diagnosis not present

## 2023-05-05 DIAGNOSIS — G9520 Unspecified cord compression: Secondary | ICD-10-CM | POA: Diagnosis not present

## 2023-05-07 DIAGNOSIS — Z96619 Presence of unspecified artificial shoulder joint: Secondary | ICD-10-CM | POA: Diagnosis not present

## 2023-05-07 DIAGNOSIS — T8459XA Infection and inflammatory reaction due to other internal joint prosthesis, initial encounter: Secondary | ICD-10-CM | POA: Diagnosis not present

## 2023-05-08 LAB — LAB REPORT - SCANNED: EGFR: 97

## 2023-05-09 ENCOUNTER — Encounter (HOSPITAL_COMMUNITY)
Admission: RE | Disposition: A | Discharge status: Home / Self Care | Payer: Self-pay | Source: Ambulatory Visit | Attending: Gastroenterology

## 2023-05-09 ENCOUNTER — Ambulatory Visit (HOSPITAL_COMMUNITY): Admitting: Anesthesiology

## 2023-05-09 ENCOUNTER — Ambulatory Visit (HOSPITAL_BASED_OUTPATIENT_CLINIC_OR_DEPARTMENT_OTHER): Admitting: Anesthesiology

## 2023-05-09 ENCOUNTER — Other Ambulatory Visit: Payer: Self-pay

## 2023-05-09 ENCOUNTER — Ambulatory Visit (HOSPITAL_COMMUNITY)
Admission: RE | Admit: 2023-05-09 | Discharge: 2023-05-09 | Disposition: A | Discharge status: Home / Self Care | Payer: Medicare Other | Source: Ambulatory Visit | Attending: Gastroenterology | Admitting: Gastroenterology

## 2023-05-09 ENCOUNTER — Encounter (HOSPITAL_COMMUNITY): Payer: Self-pay | Admitting: Gastroenterology

## 2023-05-09 DIAGNOSIS — Z87891 Personal history of nicotine dependence: Secondary | ICD-10-CM | POA: Insufficient documentation

## 2023-05-09 DIAGNOSIS — Z860100 Personal history of colon polyps, unspecified: Secondary | ICD-10-CM | POA: Diagnosis not present

## 2023-05-09 DIAGNOSIS — K5730 Diverticulosis of large intestine without perforation or abscess without bleeding: Secondary | ICD-10-CM

## 2023-05-09 DIAGNOSIS — G4733 Obstructive sleep apnea (adult) (pediatric): Secondary | ICD-10-CM | POA: Diagnosis not present

## 2023-05-09 DIAGNOSIS — I10 Essential (primary) hypertension: Secondary | ICD-10-CM | POA: Insufficient documentation

## 2023-05-09 DIAGNOSIS — D122 Benign neoplasm of ascending colon: Secondary | ICD-10-CM | POA: Diagnosis not present

## 2023-05-09 DIAGNOSIS — G4730 Sleep apnea, unspecified: Secondary | ICD-10-CM | POA: Diagnosis not present

## 2023-05-09 DIAGNOSIS — Z1211 Encounter for screening for malignant neoplasm of colon: Secondary | ICD-10-CM

## 2023-05-09 DIAGNOSIS — K648 Other hemorrhoids: Secondary | ICD-10-CM | POA: Insufficient documentation

## 2023-05-09 DIAGNOSIS — K219 Gastro-esophageal reflux disease without esophagitis: Secondary | ICD-10-CM | POA: Insufficient documentation

## 2023-05-09 DIAGNOSIS — D123 Benign neoplasm of transverse colon: Secondary | ICD-10-CM | POA: Insufficient documentation

## 2023-05-09 DIAGNOSIS — T8459XA Infection and inflammatory reaction due to other internal joint prosthesis, initial encounter: Secondary | ICD-10-CM | POA: Diagnosis not present

## 2023-05-09 DIAGNOSIS — K635 Polyp of colon: Secondary | ICD-10-CM | POA: Diagnosis not present

## 2023-05-09 DIAGNOSIS — Z96619 Presence of unspecified artificial shoulder joint: Secondary | ICD-10-CM | POA: Diagnosis not present

## 2023-05-09 HISTORY — PX: POLYPECTOMY: SHX5525

## 2023-05-09 HISTORY — PX: COLONOSCOPY WITH PROPOFOL: SHX5780

## 2023-05-09 LAB — HM COLONOSCOPY

## 2023-05-09 SURGERY — COLONOSCOPY WITH PROPOFOL
Anesthesia: General

## 2023-05-09 MED ORDER — LACTATED RINGERS IV SOLN: INTRAVENOUS | Status: DC

## 2023-05-09 MED ORDER — PROPOFOL 10 MG/ML IV BOLUS
INTRAVENOUS | Status: DC | PRN
  Administered 2023-05-09: 50 mg via INTRAVENOUS
  Administered 2023-05-09: 30 mg via INTRAVENOUS
  Administered 2023-05-09: 50 mg via INTRAVENOUS
  Administered 2023-05-09: 150 mg via INTRAVENOUS
  Administered 2023-05-09 (×5): 50 mg via INTRAVENOUS

## 2023-05-09 NOTE — Anesthesia Postprocedure Evaluation (Signed)
 Anesthesia Post Note  Patient: Nathan Stewart  Procedure(s) Performed: COLONOSCOPY WITH PROPOFOL POLYPECTOMY  Patient location during evaluation: Phase II Anesthesia Type: General Level of consciousness: awake Pain management: pain level controlled Vital Signs Assessment: post-procedure vital signs reviewed and stable Respiratory status: spontaneous breathing and respiratory function stable Cardiovascular status: blood pressure returned to baseline and stable Postop Assessment: no headache and no apparent nausea or vomiting Anesthetic complications: no Comments: Late entry   No notable events documented.   Last Vitals:  Vitals:   05/09/23 0928 05/09/23 1055  BP: (!) 165/86 115/77  Pulse: 85 87  Resp: 20 (!) 21  Temp: 36.8 C 36.4 C  SpO2: 97% 96%    Last Pain:  Vitals:   05/09/23 1055  TempSrc: Oral  PainSc: 0-No pain                 Windell Norfolk

## 2023-05-09 NOTE — Op Note (Signed)
 [facility] Patient Name: Nathan Stewart Procedure Date: 05/09/2023 9:49 AM MRN: [id number] Date of Birth: Aug 11, 1955 Attending MD: Katrinka Blazing , , [id number] CSN: [id number] Age: 68 Admit Type: Outpatient Procedure:                Colonoscopy Indications:              Surveillance: Personal history of adenomatous                            polyps on last colonoscopy > 5 years ago Providers:                Katrinka Blazing, Francoise Ceo RN, RN, Kristine L.                            Jessee Avers, Technician Referring MD:              Medicines:                Monitored Anesthesia Care Complications:            No immediate complications. Estimated Blood Loss:     Estimated blood loss: none. Procedure:                Pre-Anesthesia Assessment:                           - Prior to the procedure, a History and Physical                            was performed, and patient medications, allergies                            and sensitivities were reviewed. The patient's                            tolerance of previous anesthesia was reviewed.                           - The risks and benefits of the procedure and the                            sedation options and risks were discussed with the                            patient. All questions were answered and informed                            consent was obtained.                           - ASA Grade Assessment: II - A patient with mild                            systemic disease.                           After obtaining informed consent, the  colonoscope                            was passed under direct vision. Throughout the                            procedure, the patient's blood pressure, pulse, and                            oxygen saturations were monitored continuously. The                            [id number]-HQ190L ([id number]) scope was introduced through                            the anus and advanced to the the cecum,  identified                            by appendiceal orifice and ileocecal valve. The                            colonoscopy was performed without difficulty. The                            patient tolerated the procedure well. The quality                            of the bowel preparation was good. Scope In: 10:12:47 AM Scope Out: 10:48:34 AM Scope Withdrawal Time: 0 hours 28 minutes 57 seconds  Total Procedure Duration: 0 hours 35 minutes 47 seconds  Findings:      The perianal and digital rectal examinations were normal.      Five sessile and semi-pedunculated polyps were found in the transverse       colon and ascending colon. The polyps were 2 to 10 mm in size. These       polyps were removed with a cold snare. Resection and retrieval were       complete.      Scattered small-mouthed diverticula were found in the sigmoid colon.      Non-bleeding internal hemorrhoids were found during retroflexion. The       hemorrhoids were small. Impression:               - Five 2 to 10 mm polyps in the transverse colon                            and in the ascending colon, removed with a cold                            snare. Resected and retrieved.                           - Diverticulosis in the sigmoid colon.                           - Non-bleeding internal hemorrhoids. Moderate Sedation:  Per Anesthesia Care Recommendation:           - Discharge patient to home (ambulatory).                           - Resume previous diet.                           - Await pathology results.                           - Repeat colonoscopy in 3 years for surveillance. Procedure Code(s):        --- Professional ---                           [phone], Colonoscopy, flexible; with removal of                            tumor(s), polyp(s), or other lesion(s) by snare                            technique Diagnosis Code(s):        --- Professional ---                           Z86.010, Personal history of  colonic polyps                           D12.3, Benign neoplasm of transverse colon (hepatic                            flexure or splenic flexure)                           D12.2, Benign neoplasm of ascending colon                           K64.8, Other hemorrhoids                           K57.30, Diverticulosis of large intestine without                            perforation or abscess without bleeding CPT copyright 2022 [employer]. All rights reserved. The codes documented in this report are preliminary and upon coder review may  be revised to meet current compliance requirements. Katrinka Blazing, MD Katrinka Blazing,  05/09/2023 10:52:47 AM This report has been signed electronically. Number of Addenda: 0

## 2023-05-09 NOTE — Anesthesia Procedure Notes (Signed)
 Date/Time: 05/09/2023 10:05 AM  Performed by: Franco Nones, CRNAPre-anesthesia Checklist: Patient identified, Emergency Drugs available, Suction available, Timeout performed and Patient being monitored Patient Re-evaluated:Patient Re-evaluated prior to induction Oxygen Delivery Method: Nasal cannula Comments: Optiflow

## 2023-05-09 NOTE — H&P (Signed)
 Nathan Stewart is an 69 y.o. male.   Chief Complaint: history of colon polyps HPI: 68 y/o M with PMH of GERd, OSA, OA, coming for history of colon polyps.  Last colonoscopy in 2018, found to have 1 tubular adenoma, recommended repeat colonoscopy in 5 years.  The patient denies having any complaints such as melena, hematochezia, abdominal pain or distention, change in her bowel movement consistency or frequency, no changes in weight recently.  No family history of colorectal cancer.    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 04/29/2023   OSTEOARTHRITIS 06/07/2008   Pneumonia    SLEEP APNEA 09/20/2009   no cpap   TESTICULAR HYPOFUNCTION 08/14/2009    Past Surgical History:  Procedure Laterality Date   COLONOSCOPY     HAND SURGERY  over 20 years ago   'metal plate in wrist'   IRRIGATION AND DEBRIDEMENT SHOULDER Left 04/03/2023   Procedure: IRRIGATION AND DEBRIDEMENT SHOULDER;  Surgeon: Jones Broom, MD;  Location: [facility];  Service: Orthopedics;  Laterality: Left;   JOINT REPLACEMENT N/A    [employer] 10/19/2019   MULTIPLE TOOTH EXTRACTIONS     SHOULDER SURGERY Right 2021   ligament damage   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   Procedure: TOTAL KNEE ARTHROPLASTY;  Surgeon: Gean Birchwood, MD;  Location: [facility];  Service: Orthopedics;  Laterality: Left;   TOTAL KNEE ARTHROPLASTY Right 05/21/2021   Procedure: RIGHT TOTAL KNEE ARTHROPLASTY;  Surgeon: Gean Birchwood, MD;  Location: [facility];  Service: Orthopedics;  Laterality: Right;    TOTAL SHOULDER REVISION Left 03/11/2023   Procedure: LEFT SHOULDER REVISION GLENOID COMPONENT;  Surgeon: Jones Broom, MD;  Location: [facility];  Service: Orthopedics;  Laterality: Left;   TOTAL SHOULDER REVISION Left 04/03/2023   Procedure: SHOULDER REVISION;  Surgeon: Jones Broom, MD;  Location: [facility];  Service: Orthopedics;  Laterality: Left;    Family History  Problem Relation Age of Onset   Arthritis Mother    Heart disease Mother    Hypertension Mother    Hypertension Father    Stroke Father    Hypertension Sister    Stroke Brother    Colon cancer Paternal Uncle    Social History:  reports that he quit smoking about 26 years ago. His smoking use included cigarettes. He has been exposed to tobacco smoke. He has never used smokeless tobacco. He reports current alcohol use of about 6.0 standard drinks of alcohol per week. He reports that he does not use drugs.  Allergies:  Allergies  Allergen Reactions   Hydrocodone-Acetaminophen Nausea And Vomiting   Irbesartan     Tingling, swelling  Pt unaware of allergy reaction    Percocet [Oxycodone-Acetaminophen] Nausea And Vomiting   Shingrix [Zoster Vac Recomb Adjuvanted] Itching    Facility-Administered Medications Prior to Admission  Medication Dose Route Frequency Provider Last Rate Last Admin   0.9 %  sodium chloride infusion  500 mL Intravenous Continuous Iva Boop, MD       Medications Prior to Admission  Medication Sig Dispense Refill  ALPRAZolam (XANAX) 0.5 MG tablet TAKE 1 TABLET(0.5 MG) BY MOUTH THREE TIMES DAILY AS NEEDED FOR ANXIETY 30 tablet 3   amLODipine (NORVASC) 5 MG tablet TAKE 1 TABLET(5 MG) BY MOUTH DAILY 90 tablet 3   cefadroxil (DURICEF) 500 MG capsule Take 2 capsules (1,000 mg total) by mouth 2 (two) times daily. 120 capsule 9   ELDERBERRY PO Take 1 tablet by mouth daily.     Garlic 1000 MG CAPS Take 1,000 mg by mouth daily.     hydrochlorothiazide (HYDRODIURIL) 25 MG tablet TAKE 1 TABLET(25 MG)  BY MOUTH DAILY 90 tablet 1   Magnesium 250 MG TABS Take 250 mg by mouth daily.     methocarbamol (ROBAXIN) 500 MG tablet Take 1 tablet (500 mg total) by mouth every 6 (six) hours as needed for muscle spasms. 20 tablet 0   metoprolol succinate (TOPROL-XL) 100 MG 24 hr tablet TAKE 1 TABLET BY MOUTH EVERY DAY WITH OR IMMEDIATELY FOLLOWING A MEAL 90 tablet 1   Multiple Vitamin (MULTIVITAMIN) tablet Take 1 tablet by mouth daily. Adult 50+     Omega-3 Fatty Acids (FISH OIL) 1000 MG CPDR Take 2,000 mg by mouth daily.     ondansetron (ZOFRAN) 4 MG tablet Take 1 tablet (4 mg total) by mouth every 6 (six) hours as needed for nausea. 20 tablet 0   oxyCODONE (OXY IR/ROXICODONE) 5 MG immediate release tablet Take 1 tablet (5 mg total) by mouth every 6 (six) hours as needed for moderate pain (pain score 4-6). 20 tablet 0   vitamin C (ASCORBIC ACID) 250 MG tablet Take 250 mg by mouth daily.     ceFAZolin (ANCEF) IVPB Inject 2 g into the vein every 8 (eight) hours. Indication:  MSSA PJI of left shoulder First Dose: Yes Last Day of Therapy:  05/15/23  Labs - Once weekly:  CBC/D and BMP, Labs - Once weekly: ESR and CRP Method of administration: IV Push Method of administration may be changed at the discretion of home infusion pharmacist based upon assessment of the patient and/or caregiver's ability to self-administer the medication ordered. 114 Units 0    No results found for this or any previous visit (from the past 48 hours). No results found.  Review of Systems  All other systems reviewed and are negative.   Blood pressure (!) 165/86, pulse 85, temperature 98.2 F (36.8 C), temperature source Oral, resp. rate 20, height 6\' 2"  (1.88 m), weight 108.9 kg, SpO2 97%. Physical Exam  GENERAL: The patient is AO x3, in no acute distress. HEENT: Head is normocephalic and atraumatic. EOMI are intact. Mouth is well hydrated and without lesions. NECK: Supple. No masses LUNGS: Clear to auscultation. No presence of  rhonchi/wheezing/rales. Adequate chest expansion HEART: RRR, normal s1 and s2. ABDOMEN: Soft, nontender, no guarding, no peritoneal signs, and nondistended. BS +. No masses. EXTREMITIES: Without any cyanosis, clubbing, rash, lesions or edema. NEUROLOGIC: AOx3, no focal motor deficit. SKIN: no jaundice, no rashes  Assessment/Plan 68 y/o M with PMH of GERd, OSA, OA, coming for history of colon polyps.  Will proceed with colonoscopy.  Dolores Frame, MD 05/09/2023, 9:56 AM

## 2023-05-09 NOTE — Discharge Instructions (Addendum)
 You are being discharged to home.  Resume your previous diet.  We are waiting for your pathology results.  Your physician has recommended a repeat colonoscopy in three years for surveillance.

## 2023-05-09 NOTE — Anesthesia Preprocedure Evaluation (Signed)
 Anesthesia Evaluation  Patient identified by MRN, date of birth, ID band Patient awake    Reviewed: Allergy & Precautions, H&P , NPO status , Patient's Chart, lab work & pertinent test results, reviewed documented beta blocker date and time   Airway Mallampati: II  TM Distance: >3 FB Neck ROM: full    Dental no notable dental hx.    Pulmonary neg pulmonary ROS, sleep apnea , pneumonia, former smoker   Pulmonary exam normal breath sounds clear to auscultation       Cardiovascular Exercise Tolerance: Good hypertension, negative cardio ROS  Rhythm:regular Rate:Normal     Neuro/Psych negative neurological ROS  negative psych ROS   GI/Hepatic negative GI ROS, Neg liver ROS,GERD  ,,(+) Hepatitis -  Endo/Other  negative endocrine ROS    Renal/GU negative Renal ROS  negative genitourinary   Musculoskeletal   Abdominal   Peds  Hematology negative hematology ROS (+)   Anesthesia Other Findings   Reproductive/Obstetrics negative OB ROS                             Anesthesia Physical Anesthesia Plan  ASA: 3  Anesthesia Plan: General   Post-op Pain Management:    Induction:   PONV Risk Score and Plan: Propofol infusion  Airway Management Planned:   Additional Equipment:   Intra-op Plan:   Post-operative Plan:   Informed Consent: I have reviewed the patients History and Physical, chart, labs and discussed the procedure including the risks, benefits and alternatives for the proposed anesthesia with the patient or authorized representative who has indicated his/her understanding and acceptance.     Dental Advisory Given  Plan Discussed with: CRNA  Anesthesia Plan Comments:        Anesthesia Quick Evaluation

## 2023-05-09 NOTE — Transfer of Care (Signed)
 Immediate Anesthesia Transfer of Care Note  Patient: Nathan Stewart  Procedure(s) Performed: COLONOSCOPY WITH PROPOFOL POLYPECTOMY  Patient Location: Endoscopy Unit  Anesthesia Type:General  Level of Consciousness: awake and patient cooperative  Airway & Oxygen Therapy: Patient Spontanous Breathing  Post-op Assessment: Report given to RN and Post -op Vital signs reviewed and stable  Post vital signs: Reviewed and stable  Last Vitals:  Vitals Value Taken Time  BP 115/77 05/09/23 1055  Temp 36.4 C 05/09/23 1055  Pulse 87 05/09/23 1055  Resp 21 05/09/23 1055  SpO2 96 % 05/09/23 1055    Last Pain:  Vitals:   05/09/23 1055  TempSrc: Oral  PainSc: 0-No pain      Patients Stated Pain Goal: 5 (05/09/23 0928)  Complications: No notable events documented.

## 2023-05-12 ENCOUNTER — Encounter (HOSPITAL_COMMUNITY): Payer: Self-pay | Admitting: Gastroenterology

## 2023-05-12 LAB — SURGICAL PATHOLOGY

## 2023-05-13 ENCOUNTER — Encounter (INDEPENDENT_AMBULATORY_CARE_PROVIDER_SITE_OTHER): Payer: Self-pay | Admitting: *Deleted

## 2023-05-14 DIAGNOSIS — Z96619 Presence of unspecified artificial shoulder joint: Secondary | ICD-10-CM | POA: Diagnosis not present

## 2023-05-14 DIAGNOSIS — R531 Weakness: Secondary | ICD-10-CM | POA: Diagnosis not present

## 2023-05-14 DIAGNOSIS — M25612 Stiffness of left shoulder, not elsewhere classified: Secondary | ICD-10-CM | POA: Diagnosis not present

## 2023-05-14 DIAGNOSIS — Z96612 Presence of left artificial shoulder joint: Secondary | ICD-10-CM | POA: Diagnosis not present

## 2023-05-14 DIAGNOSIS — T8459XA Infection and inflammatory reaction due to other internal joint prosthesis, initial encounter: Secondary | ICD-10-CM | POA: Diagnosis not present

## 2023-05-17 DIAGNOSIS — T8459XA Infection and inflammatory reaction due to other internal joint prosthesis, initial encounter: Secondary | ICD-10-CM | POA: Diagnosis not present

## 2023-05-17 DIAGNOSIS — Z96619 Presence of unspecified artificial shoulder joint: Secondary | ICD-10-CM | POA: Diagnosis not present

## 2023-05-19 ENCOUNTER — Encounter (INDEPENDENT_AMBULATORY_CARE_PROVIDER_SITE_OTHER): Payer: Self-pay | Admitting: *Deleted

## 2023-05-20 DIAGNOSIS — M25612 Stiffness of left shoulder, not elsewhere classified: Secondary | ICD-10-CM | POA: Diagnosis not present

## 2023-05-20 DIAGNOSIS — R531 Weakness: Secondary | ICD-10-CM | POA: Diagnosis not present

## 2023-05-20 DIAGNOSIS — Z96612 Presence of left artificial shoulder joint: Secondary | ICD-10-CM | POA: Diagnosis not present

## 2023-05-22 DIAGNOSIS — R531 Weakness: Secondary | ICD-10-CM | POA: Diagnosis not present

## 2023-05-22 DIAGNOSIS — Z96619 Presence of unspecified artificial shoulder joint: Secondary | ICD-10-CM | POA: Diagnosis not present

## 2023-05-22 DIAGNOSIS — Z96612 Presence of left artificial shoulder joint: Secondary | ICD-10-CM | POA: Diagnosis not present

## 2023-05-22 DIAGNOSIS — M25612 Stiffness of left shoulder, not elsewhere classified: Secondary | ICD-10-CM | POA: Diagnosis not present

## 2023-05-22 DIAGNOSIS — T8459XA Infection and inflammatory reaction due to other internal joint prosthesis, initial encounter: Secondary | ICD-10-CM | POA: Diagnosis not present

## 2023-05-26 ENCOUNTER — Encounter: Payer: Self-pay | Admitting: Infectious Disease

## 2023-05-26 DIAGNOSIS — T8459XA Infection and inflammatory reaction due to other internal joint prosthesis, initial encounter: Secondary | ICD-10-CM | POA: Diagnosis not present

## 2023-05-26 DIAGNOSIS — Z96619 Presence of unspecified artificial shoulder joint: Secondary | ICD-10-CM | POA: Diagnosis not present

## 2023-05-27 DIAGNOSIS — R531 Weakness: Secondary | ICD-10-CM | POA: Diagnosis not present

## 2023-05-27 DIAGNOSIS — M25612 Stiffness of left shoulder, not elsewhere classified: Secondary | ICD-10-CM | POA: Diagnosis not present

## 2023-05-27 DIAGNOSIS — Z96612 Presence of left artificial shoulder joint: Secondary | ICD-10-CM | POA: Diagnosis not present

## 2023-05-27 DIAGNOSIS — H43812 Vitreous degeneration, left eye: Secondary | ICD-10-CM | POA: Diagnosis not present

## 2023-05-29 DIAGNOSIS — R531 Weakness: Secondary | ICD-10-CM | POA: Diagnosis not present

## 2023-05-29 DIAGNOSIS — Z96612 Presence of left artificial shoulder joint: Secondary | ICD-10-CM | POA: Diagnosis not present

## 2023-05-29 DIAGNOSIS — M25612 Stiffness of left shoulder, not elsewhere classified: Secondary | ICD-10-CM | POA: Diagnosis not present

## 2023-06-03 DIAGNOSIS — Z96612 Presence of left artificial shoulder joint: Secondary | ICD-10-CM | POA: Diagnosis not present

## 2023-06-03 DIAGNOSIS — R531 Weakness: Secondary | ICD-10-CM | POA: Diagnosis not present

## 2023-06-03 DIAGNOSIS — M25612 Stiffness of left shoulder, not elsewhere classified: Secondary | ICD-10-CM | POA: Diagnosis not present

## 2023-06-05 DIAGNOSIS — R531 Weakness: Secondary | ICD-10-CM | POA: Diagnosis not present

## 2023-06-05 DIAGNOSIS — M25612 Stiffness of left shoulder, not elsewhere classified: Secondary | ICD-10-CM | POA: Diagnosis not present

## 2023-06-05 DIAGNOSIS — Z96612 Presence of left artificial shoulder joint: Secondary | ICD-10-CM | POA: Diagnosis not present

## 2023-06-17 DIAGNOSIS — M25612 Stiffness of left shoulder, not elsewhere classified: Secondary | ICD-10-CM | POA: Diagnosis not present

## 2023-06-17 DIAGNOSIS — Z96612 Presence of left artificial shoulder joint: Secondary | ICD-10-CM | POA: Diagnosis not present

## 2023-06-17 DIAGNOSIS — R531 Weakness: Secondary | ICD-10-CM | POA: Diagnosis not present

## 2023-06-19 DIAGNOSIS — M25612 Stiffness of left shoulder, not elsewhere classified: Secondary | ICD-10-CM | POA: Diagnosis not present

## 2023-06-19 DIAGNOSIS — R531 Weakness: Secondary | ICD-10-CM | POA: Diagnosis not present

## 2023-06-19 DIAGNOSIS — Z96612 Presence of left artificial shoulder joint: Secondary | ICD-10-CM | POA: Diagnosis not present

## 2023-06-22 ENCOUNTER — Other Ambulatory Visit: Payer: Self-pay | Admitting: Family Medicine

## 2023-06-23 DIAGNOSIS — R531 Weakness: Secondary | ICD-10-CM | POA: Diagnosis not present

## 2023-06-23 DIAGNOSIS — M25612 Stiffness of left shoulder, not elsewhere classified: Secondary | ICD-10-CM | POA: Diagnosis not present

## 2023-06-23 DIAGNOSIS — Z96612 Presence of left artificial shoulder joint: Secondary | ICD-10-CM | POA: Diagnosis not present

## 2023-06-24 ENCOUNTER — Ambulatory Visit (INDEPENDENT_AMBULATORY_CARE_PROVIDER_SITE_OTHER): Payer: Medicare Other | Admitting: Gastroenterology

## 2023-06-24 IMAGING — CR DG CHEST 2V
3 series · 3 of 3 positions shown · non-contrast
Comparison: 03/13/2018

CLINICAL DATA: 66-year-old male with a history of knee surgery

EXAM:
CHEST - 2 VIEW

[w chest pa (1 of 2)]
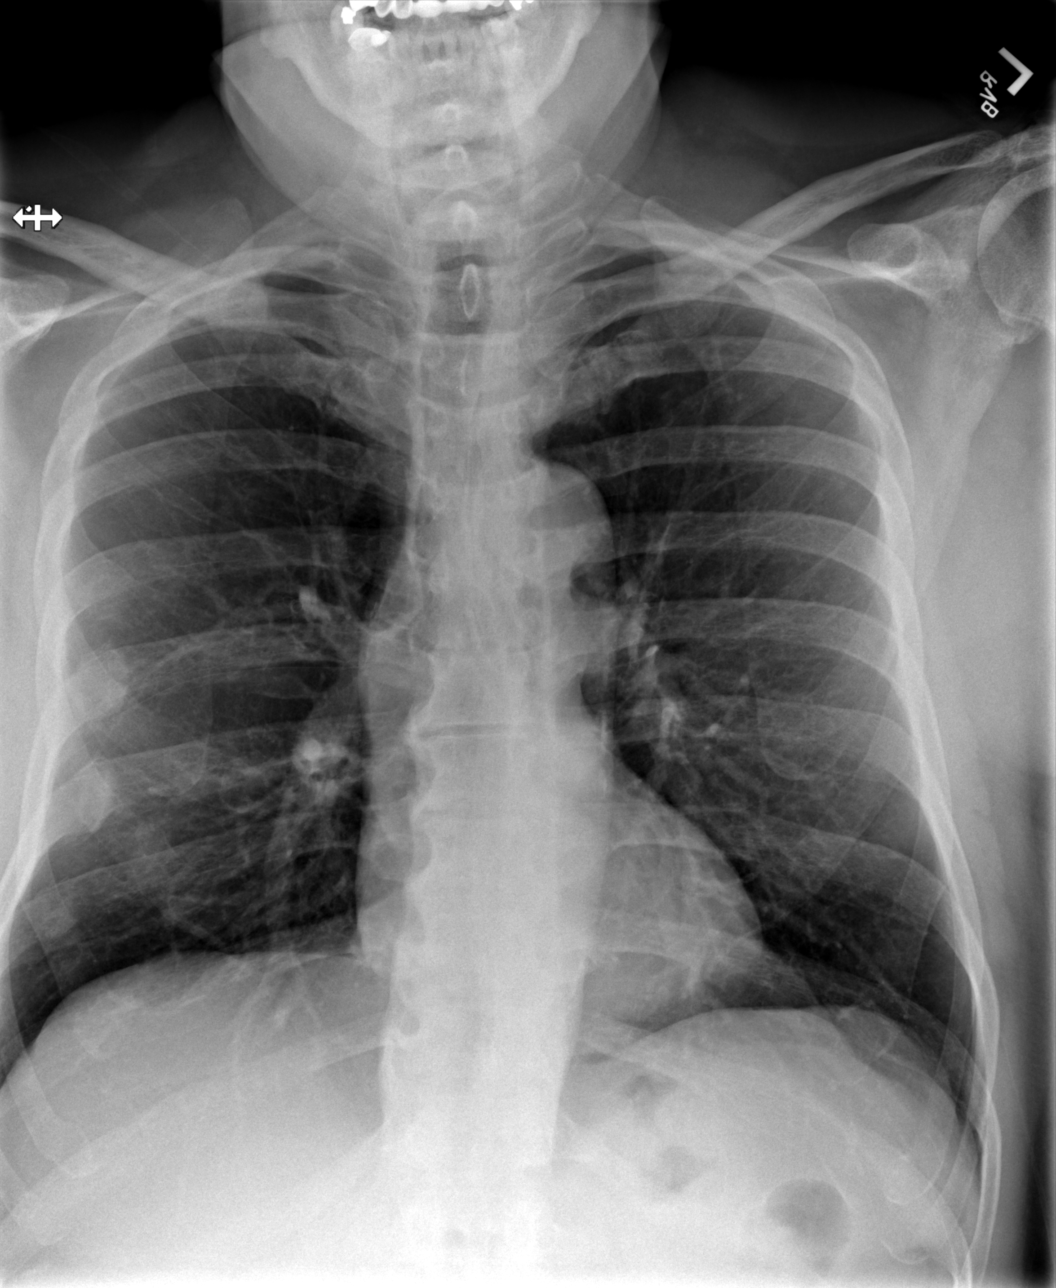

[w chest lat]
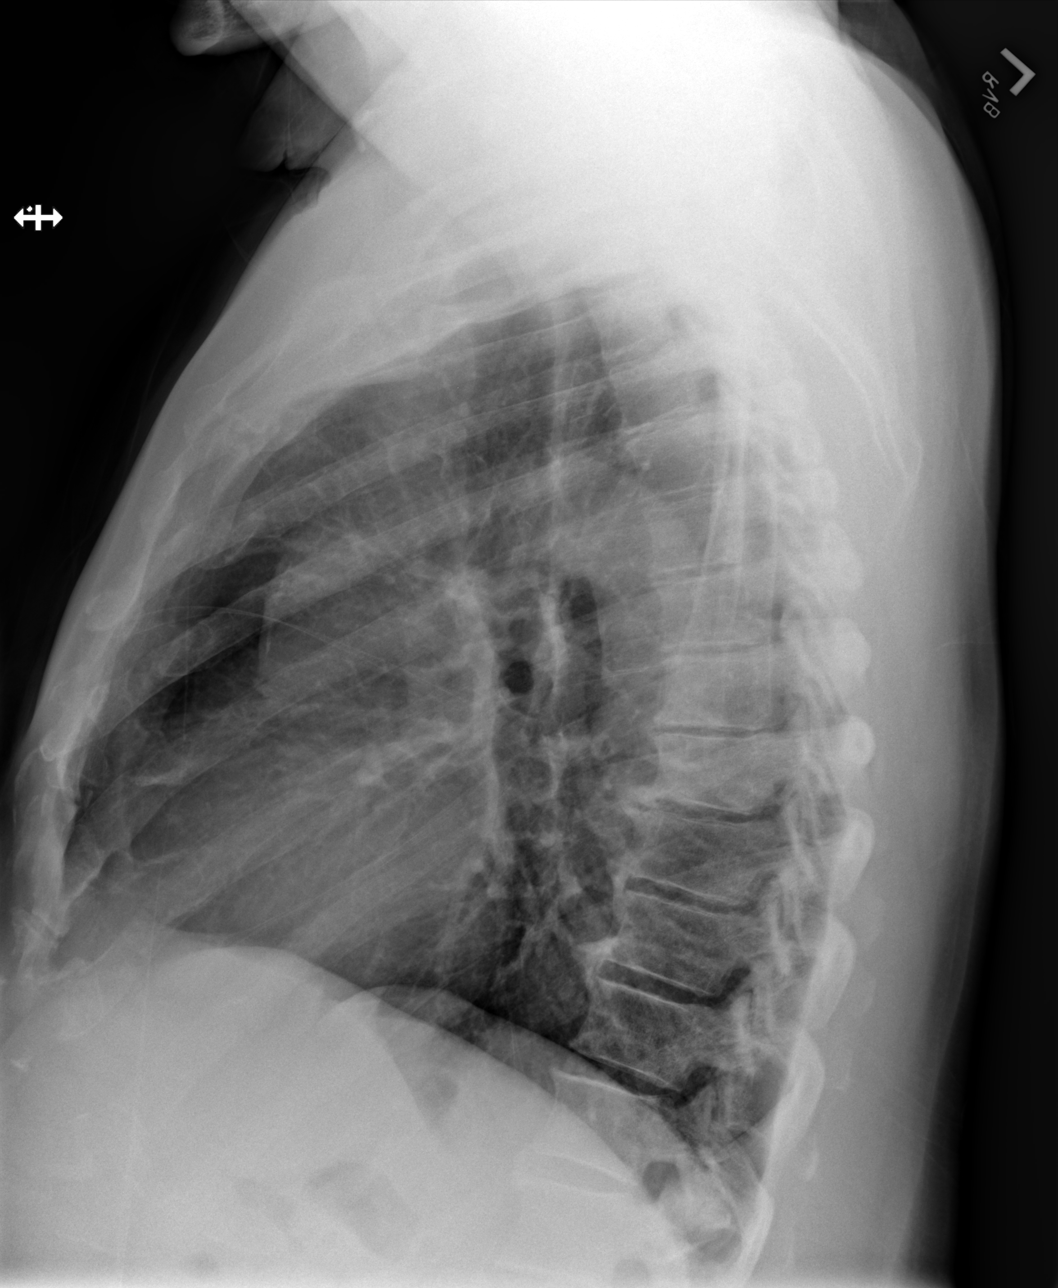

[w chest pa (2 of 2)]
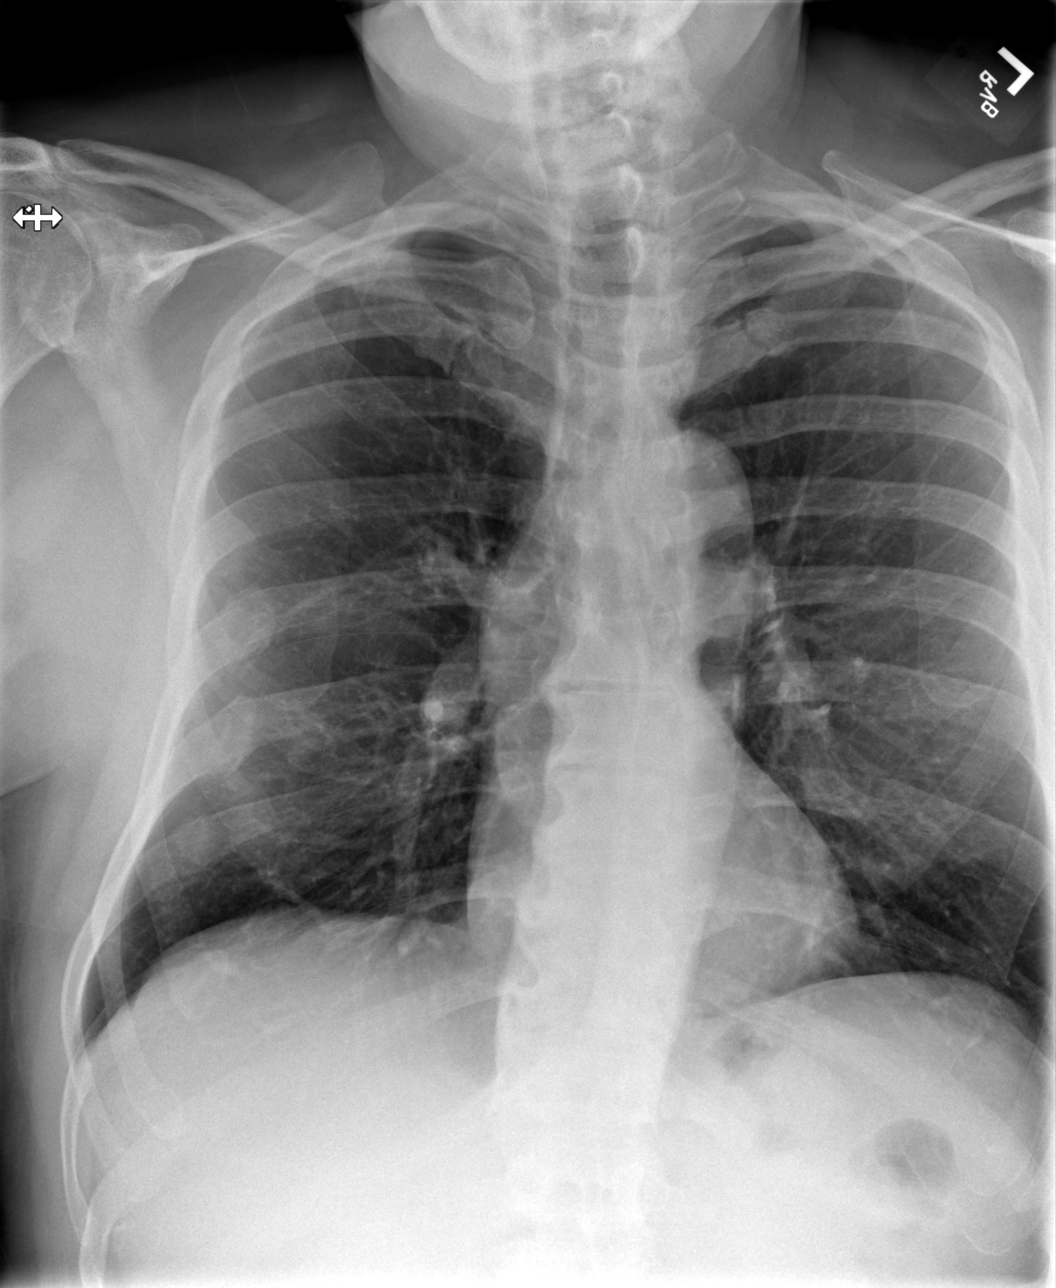

[3 of 3 positions shown; findings below may reference images not displayed]

FINDINGS: Cardiomediastinal silhouette unchanged in size and contour. No
evidence of central vascular congestion. No interlobular septal
thickening.

No pneumothorax or pleural effusion. Coarsened interstitial
markings, with no confluent airspace disease.

No acute displaced fracture. Chronic deformity of right-sided ribs.

Degenerative changes the spine
IMPRESSION: No active cardiopulmonary disease.

## 2023-06-25 DIAGNOSIS — M25612 Stiffness of left shoulder, not elsewhere classified: Secondary | ICD-10-CM | POA: Diagnosis not present

## 2023-06-25 DIAGNOSIS — R531 Weakness: Secondary | ICD-10-CM | POA: Diagnosis not present

## 2023-06-25 DIAGNOSIS — Z96612 Presence of left artificial shoulder joint: Secondary | ICD-10-CM | POA: Diagnosis not present

## 2023-06-30 DIAGNOSIS — Z96612 Presence of left artificial shoulder joint: Secondary | ICD-10-CM | POA: Diagnosis not present

## 2023-06-30 DIAGNOSIS — H43812 Vitreous degeneration, left eye: Secondary | ICD-10-CM | POA: Diagnosis not present

## 2023-06-30 DIAGNOSIS — M25612 Stiffness of left shoulder, not elsewhere classified: Secondary | ICD-10-CM | POA: Diagnosis not present

## 2023-06-30 DIAGNOSIS — R531 Weakness: Secondary | ICD-10-CM | POA: Diagnosis not present

## 2023-07-02 DIAGNOSIS — R531 Weakness: Secondary | ICD-10-CM | POA: Diagnosis not present

## 2023-07-02 DIAGNOSIS — M25612 Stiffness of left shoulder, not elsewhere classified: Secondary | ICD-10-CM | POA: Diagnosis not present

## 2023-07-02 DIAGNOSIS — Z96612 Presence of left artificial shoulder joint: Secondary | ICD-10-CM | POA: Diagnosis not present

## 2023-07-07 DIAGNOSIS — R531 Weakness: Secondary | ICD-10-CM | POA: Diagnosis not present

## 2023-07-07 DIAGNOSIS — Z96612 Presence of left artificial shoulder joint: Secondary | ICD-10-CM | POA: Diagnosis not present

## 2023-07-07 DIAGNOSIS — M25612 Stiffness of left shoulder, not elsewhere classified: Secondary | ICD-10-CM | POA: Diagnosis not present

## 2023-07-09 DIAGNOSIS — M25612 Stiffness of left shoulder, not elsewhere classified: Secondary | ICD-10-CM | POA: Diagnosis not present

## 2023-07-09 DIAGNOSIS — Z96612 Presence of left artificial shoulder joint: Secondary | ICD-10-CM | POA: Diagnosis not present

## 2023-07-09 DIAGNOSIS — R531 Weakness: Secondary | ICD-10-CM | POA: Diagnosis not present

## 2023-07-12 NOTE — Progress Notes (Signed)
 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   MRN: [id number]  HPI  Discussed the use of AI scribe software for clinical note transcription with the patient, who gave verbal consent to proceed.  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 He completed intravenous antibiotics and is now on oral cefadroxil , 500 mg two = 1000 mg twice daily, with good tolerance. He experiences no pain in the shoulder. He has noticed a change in appetite and a weight loss of ten pounds, which he views positively. Bowel movements have changed but remain regular. He is engaged in physical therapy and maintains an active schedule.       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 04/29/2023   OSTEOARTHRITIS 06/07/2008   Pneumonia    SLEEP APNEA 09/20/2009   no cpap   TESTICULAR HYPOFUNCTION 08/14/2009    Past Surgical History:  Procedure Laterality Date   COLONOSCOPY     COLONOSCOPY WITH PROPOFOL  N/A 05/09/2023   Procedure: COLONOSCOPY WITH PROPOFOL ;  Surgeon: Urban Garden, MD;  [facility];  Service: Gastroenterology;  Laterality: N/A;  10:45AM;ASA 1   HAND SURGERY  over  20 years ago   'metal plate in wrist'   IRRIGATION AND DEBRIDEMENT SHOULDER Left 04/03/2023   Procedure: IRRIGATION AND DEBRIDEMENT SHOULDER;  Surgeon: Sammye Cristal, MD;  Location: [facility];  Service: Orthopedics;  Laterality: Left;   JOINT REPLACEMENT N/A    Phreesia 10/19/2019   MULTIPLE TOOTH EXTRACTIONS     POLYPECTOMY  05/09/2023   Procedure: POLYPECTOMY;  Surgeon: Urban Garden, MD;  Location: [facility];  Service: Gastroenterology;;   SHOULDER SURGERY Right 2021   ligament damage   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   Procedure: TOTAL KNEE ARTHROPLASTY;  Surgeon: Wendolyn Hamburger, MD;  Location: [facility] OR;  Service: Orthopedics;  Laterality: Left;   TOTAL KNEE ARTHROPLASTY Right 05/21/2021   Procedure: RIGHT TOTAL KNEE ARTHROPLASTY;  Surgeon: Wendolyn Hamburger, MD;  Location: [facility];  Service: Orthopedics;  Laterality: Right;   TOTAL SHOULDER REVISION Left 03/11/2023   Procedure: LEFT SHOULDER REVISION GLENOID COMPONENT;  Surgeon: Sammye Cristal, MD;  Location: [facility];  Service: Orthopedics;  Laterality: Left;   TOTAL SHOULDER REVISION Left 04/03/2023   Procedure: SHOULDER REVISION;  Surgeon: Sammye Cristal, MD;  Location: [facility];  Service: Orthopedics;  Laterality: Left;    Family History  Problem Relation Age of Onset   Arthritis Mother    Heart disease Mother    Hypertension Mother    Hypertension Father    Stroke Father    Hypertension Sister    Stroke Brother    Colon cancer Paternal Uncle  Social History   Socioeconomic History   Marital status: Married    Spouse name: Not on file   Number of children: Not on file   Years of education: Not on file   Highest education level: Not on file  Occupational History   Not on file  Tobacco Use   Smoking status: Former    Current packs/day: 0.00    Types: Cigarettes    Quit date: 02/25/1997    Years since quitting: 26.3    Passive exposure: Current   Smokeless tobacco: Never   Tobacco comments:    quit  1999  Vaping Use   Vaping status: Never Used  Substance and Sexual Activity   Alcohol use: Yes    Alcohol/week: 6.0 standard drinks of alcohol    Types: 6 Cans of beer per week    Comment: occasional   Drug use: No   Sexual activity: Yes  Other Topics Concern   Not on file  Social History Narrative   Not on file   Social Drivers of Health   Financial Resource Strain: Low Risk  (12/07/2022)   [facility]   Overall Financial Resource Strain (CARDIA)    Difficulty of Paying Living Expenses: Not hard at all  Food Insecurity: No Food Insecurity (04/03/2023)   Hunger Vital Sign    Worried About Running Out of Food in the Last Year: Never true    Ran Out of Food in the Last Year: Never true  Transportation Needs: No Transportation Needs (04/03/2023)   PRAPARE - Administrator, Civil Service (Medical): No    Lack of Transportation (Non-Medical): No  Physical Activity: Unknown (12/07/2022)   [facility]   Exercise Vital Sign    Days of Exercise per Week: 3 days    Minutes of Exercise per Session: Not on file  Recent Concern: Physical Activity - Inactive (11/28/2022)   Exercise Vital Sign    Days of Exercise per Week: 0 days    Minutes of Exercise per Session: 0 min  Stress: No Stress Concern Present (11/28/2022)   [employer] of Occupational Health - Occupational Stress Questionnaire    Feeling of Stress : Not at all  Social Connections: Socially Integrated (04/03/2023)   Social Connection and Isolation Panel [NHANES]    Frequency of Communication with Friends and Family: More than three times a week    Frequency of Social Gatherings with Friends and Family: Three times a week    Attends Religious Services: More than 4 times per year    Active Member of Clubs or Organizations: Yes    Attends Engineer, structural: More than 4 times per year    Marital Status: Married    Allergies  Allergen Reactions    Hydrocodone -Acetaminophen  Nausea And Vomiting   Irbesartan      Tingling, swelling  Pt unaware of allergy reaction    Percocet [Oxycodone -Acetaminophen ] Nausea And Vomiting   Shingrix [Zoster Vac Recomb Adjuvanted] Itching     Current Outpatient Medications:    ALPRAZolam  (XANAX ) 0.5 MG tablet, TAKE 1 TABLET(0.5 MG) BY MOUTH THREE TIMES DAILY AS NEEDED FOR ANXIETY, Disp: 30 tablet, Rfl: 3   amLODipine  (NORVASC ) 5 MG tablet, TAKE 1 TABLET(5 MG) BY MOUTH DAILY, Disp: 90 tablet, Rfl: 3   cefadroxil  (DURICEF) 500 MG capsule, Take 2 capsules (1,000 mg total) by mouth 2 (two) times daily., Disp: 120 capsule, Rfl: 9   ELDERBERRY PO, Take 1 tablet by mouth daily., Disp: ,  Rfl:    Garlic 1000 MG CAPS, Take 1,000 mg by mouth daily., Disp: , Rfl:    hydrochlorothiazide  (HYDRODIURIL ) 25 MG tablet, TAKE 1 TABLET(25 MG) BY MOUTH DAILY, Disp: 90 tablet, Rfl: 1   Magnesium 250 MG TABS, Take 250 mg by mouth daily., Disp: , Rfl:    methocarbamol  (ROBAXIN ) 500 MG tablet, Take 1 tablet (500 mg total) by mouth every 6 (six) hours as needed for muscle spasms., Disp: 20 tablet, Rfl: 0   metoprolol  succinate (TOPROL -XL) 100 MG 24 hr tablet, TAKE 1 TABLET BY MOUTH EVERY DAY WITH OR IMMEDIATELY FOLLOWING A MEAL, Disp: 90 tablet, Rfl: 1   Multiple Vitamin (MULTIVITAMIN) tablet, Take 1 tablet by mouth daily. Adult 50+, Disp: , Rfl:    Omega-3 Fatty Acids (FISH OIL) 1000 MG CPDR, Take 2,000 mg by mouth daily., Disp: , Rfl:    ondansetron  (ZOFRAN ) 4 MG tablet, Take 1 tablet (4 mg total) by mouth every 6 (six) hours as needed for nausea., Disp: 20 tablet, Rfl: 0   oxyCODONE  (OXY IR/ROXICODONE ) 5 MG immediate release tablet, Take 1 tablet (5 mg total) by mouth every 6 (six) hours as needed for moderate pain (pain score 4-6)., Disp: 20 tablet, Rfl: 0   vitamin C (ASCORBIC ACID) 250 MG tablet, Take 250 mg by mouth daily., Disp: , Rfl:   Current Facility-Administered Medications:    0.9 %  sodium chloride  infusion, 500 mL,  Intravenous, Continuous, Kenney Peacemaker, MD   Review of Systems  Constitutional:  Negative for activity change, appetite change, chills, diaphoresis, fatigue, fever and unexpected weight change.  HENT:  Negative for congestion, rhinorrhea, sinus pressure, sneezing, sore throat and trouble swallowing.   Eyes:  Negative for photophobia and visual disturbance.  Respiratory:  Negative for cough, chest tightness, shortness of breath, wheezing and stridor.   Cardiovascular:  Negative for chest pain, palpitations and leg swelling.  Gastrointestinal:  Negative for abdominal distention, abdominal pain, anal bleeding, blood in stool, constipation, diarrhea, nausea and vomiting.  Genitourinary:  Negative for difficulty urinating, dysuria, flank pain and hematuria.  Musculoskeletal:  Positive for arthralgias. Negative for back pain, gait problem, joint swelling and myalgias.  Skin:  Negative for color change, pallor, rash and wound.  Neurological:  Negative for dizziness, tremors, weakness and light-headedness.  Hematological:  Negative for adenopathy. Does not bruise/bleed easily.  Psychiatric/Behavioral:  Negative for agitation, behavioral problems, confusion, decreased concentration, dysphoric mood and sleep disturbance.        Objective:   Physical Exam Constitutional:      Appearance: He is well-developed.  HENT:     Head: Normocephalic and atraumatic.  Eyes:     Conjunctiva/sclera: Conjunctivae normal.  Cardiovascular:     Rate and Rhythm: Normal rate and regular rhythm.  Pulmonary:     Effort: Pulmonary effort is normal. No respiratory distress.     Breath sounds: No wheezing.  Abdominal:     General: There is no distension.     Palpations: Abdomen is soft.  Musculoskeletal:     Cervical back: Normal range of motion and neck supple.  Skin:    General: Skin is warm and dry.     Coloration: Skin is not pale.     Findings: No erythema or rash.  Neurological:     General: No focal  deficit present.     Mental Status: He is alert and oriented to person, place, and time.  Psychiatric:        Mood and Affect: Mood  normal.        Behavior: Behavior normal.        Thought Content: Thought content normal.        Judgment: Judgment normal.           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 - Order blood work to monitor infection markers., ESR, CRP - Schedule follow-up in two months.      I have personally spent 26  minutes involved in face-to-face and non-face-to-face activities for this patient on the day of the visit. Professional time spent includes the following activities: Preparing to see the patient (review oflabs, Obtaining and/or reviewing separately obtained history (hospitalization record., Performing a medically appropriate examination and/or evaluation , Ordering cefadroxil  and labs, , referring and communicating with other health care professionals, Documenting clinical information in the EMR, Independently interpreting results (not separately reported), Communicating results to the patient/fCounseling and educating the patient/family/caregiver and Care coordination (not separately reported).

## 2023-07-14 ENCOUNTER — Other Ambulatory Visit: Payer: Self-pay

## 2023-07-14 ENCOUNTER — Encounter: Payer: Self-pay | Admitting: Infectious Disease

## 2023-07-14 ENCOUNTER — Ambulatory Visit: Payer: Self-pay | Admitting: Infectious Disease

## 2023-07-14 VITALS — BP 156/87 | HR 88 | Temp 98.4°F | Wt 230.0 lb

## 2023-07-14 DIAGNOSIS — Z96612 Presence of left artificial shoulder joint: Secondary | ICD-10-CM | POA: Diagnosis not present

## 2023-07-14 DIAGNOSIS — T8459XA Infection and inflammatory reaction due to other internal joint prosthesis, initial encounter: Secondary | ICD-10-CM

## 2023-07-14 DIAGNOSIS — R531 Weakness: Secondary | ICD-10-CM | POA: Diagnosis not present

## 2023-07-14 DIAGNOSIS — A4901 Methicillin susceptible Staphylococcus aureus infection, unspecified site: Secondary | ICD-10-CM | POA: Diagnosis not present

## 2023-07-14 DIAGNOSIS — B9689 Other specified bacterial agents as the cause of diseases classified elsewhere: Secondary | ICD-10-CM | POA: Diagnosis not present

## 2023-07-14 DIAGNOSIS — Z96619 Presence of unspecified artificial shoulder joint: Secondary | ICD-10-CM | POA: Diagnosis not present

## 2023-07-14 DIAGNOSIS — M25612 Stiffness of left shoulder, not elsewhere classified: Secondary | ICD-10-CM | POA: Diagnosis not present

## 2023-07-15 LAB — CBC WITH DIFFERENTIAL/PLATELET
Absolute Lymphocytes: 807 {cells}/uL — ABNORMAL LOW (ref 850–3900)
Absolute Monocytes: 525 {cells}/uL (ref 200–950)
Basophils Absolute: 30 {cells}/uL (ref 0–200)
Basophils Relative: 0.4 %
Eosinophils Absolute: 178 {cells}/uL (ref 15–500)
Eosinophils Relative: 2.4 %
HCT: 43.6 % (ref 38.5–50.0)
Hemoglobin: 14.5 g/dL (ref 13.2–17.1)
MCH: 29 pg (ref 27.0–33.0)
MCHC: 33.3 g/dL (ref 32.0–36.0)
MCV: 87.2 fL (ref 80.0–100.0)
MPV: 11.4 fL (ref 7.5–12.5)
Monocytes Relative: 7.1 %
Neutro Abs: 5861 {cells}/uL (ref 1500–7800)
Neutrophils Relative %: 79.2 %
Platelets: 199 10*3/uL (ref 140–400)
RBC: 5 10*6/uL (ref 4.20–5.80)
RDW: 13.1 % (ref 11.0–15.0)
Total Lymphocyte: 10.9 %
WBC: 7.4 10*3/uL (ref 3.8–10.8)

## 2023-07-15 LAB — BASIC METABOLIC PANEL WITHOUT GFR
BUN: 14 mg/dL (ref 7–25)
CO2: 24 mmol/L (ref 20–32)
Calcium: 10 mg/dL (ref 8.6–10.3)
Chloride: 96 mmol/L — ABNORMAL LOW (ref 98–110)
Creat: 1.04 mg/dL (ref 0.70–1.35)
Glucose, Bld: 108 mg/dL — ABNORMAL HIGH (ref 65–99)
Potassium: 3.5 mmol/L (ref 3.5–5.3)
Sodium: 135 mmol/L (ref 135–146)

## 2023-07-15 LAB — SEDIMENTATION RATE: Sed Rate: 33 mm/h — ABNORMAL HIGH (ref 0–20)

## 2023-07-15 LAB — C-REACTIVE PROTEIN: CRP: 157 mg/L — ABNORMAL HIGH (ref <8.0)

## 2023-07-17 DIAGNOSIS — Z96612 Presence of left artificial shoulder joint: Secondary | ICD-10-CM | POA: Diagnosis not present

## 2023-07-17 DIAGNOSIS — R531 Weakness: Secondary | ICD-10-CM | POA: Diagnosis not present

## 2023-07-17 DIAGNOSIS — M25612 Stiffness of left shoulder, not elsewhere classified: Secondary | ICD-10-CM | POA: Diagnosis not present

## 2023-07-22 DIAGNOSIS — M25612 Stiffness of left shoulder, not elsewhere classified: Secondary | ICD-10-CM | POA: Diagnosis not present

## 2023-07-22 DIAGNOSIS — R531 Weakness: Secondary | ICD-10-CM | POA: Diagnosis not present

## 2023-07-22 DIAGNOSIS — Z96612 Presence of left artificial shoulder joint: Secondary | ICD-10-CM | POA: Diagnosis not present

## 2023-07-24 DIAGNOSIS — R531 Weakness: Secondary | ICD-10-CM | POA: Diagnosis not present

## 2023-07-24 DIAGNOSIS — Z96612 Presence of left artificial shoulder joint: Secondary | ICD-10-CM | POA: Diagnosis not present

## 2023-07-24 DIAGNOSIS — M25612 Stiffness of left shoulder, not elsewhere classified: Secondary | ICD-10-CM | POA: Diagnosis not present

## 2023-07-28 DIAGNOSIS — M25612 Stiffness of left shoulder, not elsewhere classified: Secondary | ICD-10-CM | POA: Diagnosis not present

## 2023-07-30 DIAGNOSIS — R531 Weakness: Secondary | ICD-10-CM | POA: Diagnosis not present

## 2023-07-30 DIAGNOSIS — Z96612 Presence of left artificial shoulder joint: Secondary | ICD-10-CM | POA: Diagnosis not present

## 2023-07-30 DIAGNOSIS — M25612 Stiffness of left shoulder, not elsewhere classified: Secondary | ICD-10-CM | POA: Diagnosis not present

## 2023-08-04 ENCOUNTER — Other Ambulatory Visit: Payer: Self-pay | Admitting: Family Medicine

## 2023-08-04 DIAGNOSIS — G9520 Unspecified cord compression: Secondary | ICD-10-CM | POA: Diagnosis not present

## 2023-08-04 DIAGNOSIS — M5030 Other cervical disc degeneration, unspecified cervical region: Secondary | ICD-10-CM | POA: Diagnosis not present

## 2023-08-04 DIAGNOSIS — M47812 Spondylosis without myelopathy or radiculopathy, cervical region: Secondary | ICD-10-CM | POA: Diagnosis not present

## 2023-08-05 DIAGNOSIS — Z96612 Presence of left artificial shoulder joint: Secondary | ICD-10-CM | POA: Diagnosis not present

## 2023-08-05 DIAGNOSIS — R531 Weakness: Secondary | ICD-10-CM | POA: Diagnosis not present

## 2023-08-05 DIAGNOSIS — M25612 Stiffness of left shoulder, not elsewhere classified: Secondary | ICD-10-CM | POA: Diagnosis not present

## 2023-08-07 DIAGNOSIS — Z96612 Presence of left artificial shoulder joint: Secondary | ICD-10-CM | POA: Diagnosis not present

## 2023-08-07 DIAGNOSIS — M25612 Stiffness of left shoulder, not elsewhere classified: Secondary | ICD-10-CM | POA: Diagnosis not present

## 2023-08-07 DIAGNOSIS — R531 Weakness: Secondary | ICD-10-CM | POA: Diagnosis not present

## 2023-08-11 DIAGNOSIS — M25612 Stiffness of left shoulder, not elsewhere classified: Secondary | ICD-10-CM | POA: Diagnosis not present

## 2023-08-11 DIAGNOSIS — Z96612 Presence of left artificial shoulder joint: Secondary | ICD-10-CM | POA: Diagnosis not present

## 2023-08-11 DIAGNOSIS — R531 Weakness: Secondary | ICD-10-CM | POA: Diagnosis not present

## 2023-08-14 DIAGNOSIS — H40023 Open angle with borderline findings, high risk, bilateral: Secondary | ICD-10-CM | POA: Diagnosis not present

## 2023-08-14 DIAGNOSIS — H43812 Vitreous degeneration, left eye: Secondary | ICD-10-CM | POA: Diagnosis not present

## 2023-08-20 DIAGNOSIS — R531 Weakness: Secondary | ICD-10-CM | POA: Diagnosis not present

## 2023-08-20 DIAGNOSIS — Z96612 Presence of left artificial shoulder joint: Secondary | ICD-10-CM | POA: Diagnosis not present

## 2023-08-20 DIAGNOSIS — M25612 Stiffness of left shoulder, not elsewhere classified: Secondary | ICD-10-CM | POA: Diagnosis not present

## 2023-08-22 DIAGNOSIS — R531 Weakness: Secondary | ICD-10-CM | POA: Diagnosis not present

## 2023-08-22 DIAGNOSIS — M25612 Stiffness of left shoulder, not elsewhere classified: Secondary | ICD-10-CM | POA: Diagnosis not present

## 2023-08-22 DIAGNOSIS — Z96612 Presence of left artificial shoulder joint: Secondary | ICD-10-CM | POA: Diagnosis not present

## 2023-08-27 DIAGNOSIS — M25612 Stiffness of left shoulder, not elsewhere classified: Secondary | ICD-10-CM | POA: Diagnosis not present

## 2023-08-27 DIAGNOSIS — Z96612 Presence of left artificial shoulder joint: Secondary | ICD-10-CM | POA: Diagnosis not present

## 2023-08-27 DIAGNOSIS — R531 Weakness: Secondary | ICD-10-CM | POA: Diagnosis not present

## 2023-08-28 DIAGNOSIS — M25612 Stiffness of left shoulder, not elsewhere classified: Secondary | ICD-10-CM | POA: Diagnosis not present

## 2023-08-28 DIAGNOSIS — Z96612 Presence of left artificial shoulder joint: Secondary | ICD-10-CM | POA: Diagnosis not present

## 2023-08-28 DIAGNOSIS — R531 Weakness: Secondary | ICD-10-CM | POA: Diagnosis not present

## 2023-09-01 DIAGNOSIS — Z96612 Presence of left artificial shoulder joint: Secondary | ICD-10-CM | POA: Diagnosis not present

## 2023-09-01 DIAGNOSIS — M25662 Stiffness of left knee, not elsewhere classified: Secondary | ICD-10-CM | POA: Diagnosis not present

## 2023-09-01 DIAGNOSIS — R531 Weakness: Secondary | ICD-10-CM | POA: Diagnosis not present

## 2023-09-03 DIAGNOSIS — M25612 Stiffness of left shoulder, not elsewhere classified: Secondary | ICD-10-CM | POA: Diagnosis not present

## 2023-09-03 DIAGNOSIS — R531 Weakness: Secondary | ICD-10-CM | POA: Diagnosis not present

## 2023-09-03 DIAGNOSIS — Z96612 Presence of left artificial shoulder joint: Secondary | ICD-10-CM | POA: Diagnosis not present

## 2023-09-08 DIAGNOSIS — M25612 Stiffness of left shoulder, not elsewhere classified: Secondary | ICD-10-CM | POA: Diagnosis not present

## 2023-09-08 DIAGNOSIS — R531 Weakness: Secondary | ICD-10-CM | POA: Diagnosis not present

## 2023-09-08 DIAGNOSIS — Z96612 Presence of left artificial shoulder joint: Secondary | ICD-10-CM | POA: Diagnosis not present

## 2023-09-10 DIAGNOSIS — Z96612 Presence of left artificial shoulder joint: Secondary | ICD-10-CM | POA: Diagnosis not present

## 2023-09-10 DIAGNOSIS — R531 Weakness: Secondary | ICD-10-CM | POA: Diagnosis not present

## 2023-09-10 DIAGNOSIS — M25612 Stiffness of left shoulder, not elsewhere classified: Secondary | ICD-10-CM | POA: Diagnosis not present

## 2023-09-12 ENCOUNTER — Encounter: Payer: Self-pay | Admitting: Advanced Practice Midwife

## 2023-09-16 ENCOUNTER — Encounter: Payer: Self-pay | Admitting: Infectious Disease

## 2023-09-16 ENCOUNTER — Other Ambulatory Visit: Payer: Self-pay | Admitting: Family Medicine

## 2023-09-16 DIAGNOSIS — Z96612 Presence of left artificial shoulder joint: Secondary | ICD-10-CM | POA: Insufficient documentation

## 2023-09-16 DIAGNOSIS — M009 Pyogenic arthritis, unspecified: Secondary | ICD-10-CM | POA: Insufficient documentation

## 2023-09-16 NOTE — Progress Notes (Unsigned)
 Chief complaint: follow-up for prosthetic shoulder infection  Subjective:    Patient ID: Nathan Stewart, male    DOB: [birth year], 68 y.o.   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 04/29/2023   OSTEOARTHRITIS 06/07/2008   Pneumonia    SLEEP APNEA 09/20/2009   no cpap   TESTICULAR HYPOFUNCTION 08/14/2009    Past Surgical History:  Procedure Laterality Date   COLONOSCOPY     COLONOSCOPY WITH PROPOFOL  N/A 05/09/2023   Procedure: COLONOSCOPY WITH PROPOFOL ;  Surgeon: Eartha Angelia Sieving, MD;  [facility];  Service: Gastroenterology;  Laterality: N/A;  10:45AM;ASA 1   HAND SURGERY  over 20 years ago   'metal plate in wrist'   IRRIGATION AND DEBRIDEMENT SHOULDER Left 04/03/2023   Procedure: IRRIGATION AND DEBRIDEMENT SHOULDER;  Surgeon: Dozier Soulier, MD;  Location: [facility];  Service: Orthopedics;  Laterality: Left;   JOINT REPLACEMENT N/A    Phreesia 10/19/2019   MULTIPLE TOOTH EXTRACTIONS     POLYPECTOMY  05/09/2023   Procedure: POLYPECTOMY;  Surgeon: Eartha Angelia Sieving, MD;  [facility];  Service: Gastroenterology;;   SHOULDER SURGERY Right 2021   ligament damage   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   Procedure: TOTAL KNEE ARTHROPLASTY;  Surgeon: Dempsey Sensor, MD;  Location: [facility] OR;  Service: Orthopedics;  Laterality: Left;   TOTAL KNEE ARTHROPLASTY Right 05/21/2021   Procedure: RIGHT TOTAL KNEE ARTHROPLASTY;  Surgeon: Sensor Dempsey, MD;  Location: [facility];  Service:  Orthopedics;  Laterality: Right;   TOTAL SHOULDER REVISION Left 03/11/2023   Procedure: LEFT SHOULDER REVISION GLENOID COMPONENT;  Surgeon: Dozier Soulier, MD;  Location: [facility];  Service: Orthopedics;  Laterality: Left;   TOTAL SHOULDER REVISION Left 04/03/2023   Procedure: SHOULDER REVISION;  Surgeon: Dozier Soulier, MD;  Location: [facility];  Service: Orthopedics;  Laterality: Left;    Family History  Problem Relation Age of Onset   Arthritis Mother    Heart disease Mother    Hypertension Mother    Hypertension Father    Stroke Father    Hypertension Sister    Stroke Brother    Colon cancer Paternal Uncle       Social History   Socioeconomic History   Marital status: Married    Spouse name: Not on file   Number of children: Not on file   Years of education: Not on file   Highest education level: Not on file  Occupational History   Not on file  Tobacco Use   Smoking status: Former    Current packs/day: 0.00    Types: Cigarettes    Quit date: 02/25/1997    Years since quitting: 26.5    Passive exposure: Current   Smokeless tobacco: Never   Tobacco comments:    quit 1999  Vaping Use   Vaping status: Never Used  Substance and Sexual Activity   Alcohol use: Yes    Alcohol/week: 6.0 standard drinks of alcohol    Types: 6 Cans of beer per week  Comment: occasional   Drug use: No   Sexual activity: Yes  Other Topics Concern   Not on file  Social History Narrative   Not on file   Social Drivers of Health   Financial Resource Strain: Low Risk  (12/07/2022)   Received from [facility]   Overall Financial Resource Strain (CARDIA)    Difficulty of Paying Living Expenses: Not hard at all  Food Insecurity: No Food Insecurity (04/03/2023)   Hunger Vital Sign    Worried About Running Out of Food in the Last Year: Never true    Ran Out of Food in the Last Year: Never true  Transportation Needs: No Transportation Needs (04/03/2023)   PRAPARE - Scientist, research (physical sciences) (Medical): No    Lack of Transportation (Non-Medical): No  Physical Activity: Unknown (12/07/2022)   Received from [facility]   Exercise Vital Sign    On average, how many days per week do you engage in moderate to strenuous exercise (like a brisk walk)?: 3 days    Minutes of Exercise per Session: Not on file  Recent Concern: Physical Activity - Inactive (11/28/2022)   Exercise Vital Sign    Days of Exercise per Week: 0 days    Minutes of Exercise per Session: 0 min  Stress: No Stress Concern Present (11/28/2022)   [employer] of Occupational Health - Occupational Stress Questionnaire    Feeling of Stress : Not at all  Social Connections: Socially Integrated (04/03/2023)   Social Connection and Isolation Panel    Frequency of Communication with Friends and Family: More than three times a week    Frequency of Social Gatherings with Friends and Family: Three times a week    Attends Religious Services: More than 4 times per year    Active Member of Clubs or Organizations: Yes    Attends Engineer, structural: More than 4 times per year    Marital Status: Married    Allergies  Allergen Reactions   Hydrocodone -Acetaminophen  Nausea And Vomiting   Irbesartan      Tingling, swelling  Pt unaware of allergy reaction    Percocet [Oxycodone -Acetaminophen ] Nausea And Vomiting   Shingrix [Zoster Vac Recomb Adjuvanted] Itching     Current Outpatient Medications:    ALPRAZolam  (XANAX ) 0.5 MG tablet, TAKE 1 TABLET(0.5 MG) BY MOUTH THREE TIMES DAILY AS NEEDED FOR ANXIETY, Disp: 30 tablet, Rfl: 2   amLODipine  (NORVASC ) 5 MG tablet, TAKE 1 TABLET(5 MG) BY MOUTH DAILY, Disp: 90 tablet, Rfl: 3   cefadroxil  (DURICEF) 500 MG capsule, Take 2 capsules (1,000 mg total) by mouth 2 (two) times daily., Disp: 120 capsule, Rfl: 9   ELDERBERRY PO, Take 1 tablet by mouth daily., Disp: , Rfl:    Garlic 1000 MG CAPS, Take 1,000 mg by mouth daily., Disp: , Rfl:    hydrochlorothiazide   (HYDRODIURIL ) 25 MG tablet, TAKE 1 TABLET(25 MG) BY MOUTH DAILY, Disp: 90 tablet, Rfl: 1   Magnesium 250 MG TABS, Take 250 mg by mouth daily., Disp: , Rfl:    methocarbamol  (ROBAXIN ) 500 MG tablet, Take 1 tablet (500 mg total) by mouth every 6 (six) hours as needed for muscle spasms., Disp: 20 tablet, Rfl: 0   metoprolol  succinate (TOPROL -XL) 100 MG 24 hr tablet, TAKE 1 TABLET BY MOUTH EVERY DAY WITH OR IMMEDIATELY FOLLOWING A MEAL, Disp: 90 tablet, Rfl: 1   Multiple Vitamin (MULTIVITAMIN) tablet, Take 1 tablet by mouth daily. Adult 50+, Disp: , Rfl:  Omega-3 Fatty Acids (FISH OIL) 1000 MG CPDR, Take 2,000 mg by mouth daily., Disp: , Rfl:    ondansetron  (ZOFRAN ) 4 MG tablet, Take 1 tablet (4 mg total) by mouth every 6 (six) hours as needed for nausea., Disp: 20 tablet, Rfl: 0   oxyCODONE  (OXY IR/ROXICODONE ) 5 MG immediate release tablet, Take 1 tablet (5 mg total) by mouth every 6 (six) hours as needed for moderate pain (pain score 4-6)., Disp: 20 tablet, Rfl: 0   vitamin C (ASCORBIC ACID) 250 MG tablet, Take 250 mg by mouth daily., Disp: , Rfl:   Current Facility-Administered Medications:    0.9 %  sodium chloride  infusion, 500 mL, Intravenous, Continuous, Avram Lupita BRAVO, MD   Review of Systems     Objective:   Physical Exam        Assessment & Plan:

## 2023-09-17 ENCOUNTER — Ambulatory Visit: Payer: Self-pay | Admitting: Infectious Disease

## 2023-09-17 ENCOUNTER — Encounter: Payer: Self-pay | Admitting: Infectious Disease

## 2023-09-17 ENCOUNTER — Other Ambulatory Visit: Payer: Self-pay

## 2023-09-17 VITALS — BP 134/80 | HR 87 | Temp 97.8°F | Ht 74.0 in | Wt 228.0 lb

## 2023-09-17 DIAGNOSIS — M00012 Staphylococcal arthritis, left shoulder: Secondary | ICD-10-CM | POA: Diagnosis not present

## 2023-09-17 DIAGNOSIS — Z96612 Presence of left artificial shoulder joint: Secondary | ICD-10-CM

## 2023-09-17 DIAGNOSIS — A4901 Methicillin susceptible Staphylococcus aureus infection, unspecified site: Secondary | ICD-10-CM

## 2023-09-17 DIAGNOSIS — M4802 Spinal stenosis, cervical region: Secondary | ICD-10-CM

## 2023-09-17 DIAGNOSIS — T849XXD Unspecified complication of internal orthopedic prosthetic device, implant and graft, subsequent encounter: Secondary | ICD-10-CM | POA: Diagnosis not present

## 2023-09-18 LAB — CBC WITH DIFFERENTIAL/PLATELET
Absolute Lymphocytes: 1661 {cells}/uL (ref 850–3900)
Absolute Monocytes: 693 {cells}/uL (ref 200–950)
Basophils Absolute: 55 {cells}/uL (ref 0–200)
Basophils Relative: 0.5 %
Eosinophils Absolute: 319 {cells}/uL (ref 15–500)
Eosinophils Relative: 2.9 %
HCT: 42.2 % (ref 38.5–50.0)
Hemoglobin: 14.1 g/dL (ref 13.2–17.1)
MCH: 29.7 pg (ref 27.0–33.0)
MCHC: 33.4 g/dL (ref 32.0–36.0)
MCV: 88.8 fL (ref 80.0–100.0)
MPV: 11.1 fL (ref 7.5–12.5)
Monocytes Relative: 6.3 %
Neutro Abs: 8272 {cells}/uL — ABNORMAL HIGH (ref 1500–7800)
Neutrophils Relative %: 75.2 %
Platelets: 268 Thousand/uL (ref 140–400)
RBC: 4.75 Million/uL (ref 4.20–5.80)
RDW: 12.8 % (ref 11.0–15.0)
Total Lymphocyte: 15.1 %
WBC: 11 Thousand/uL — ABNORMAL HIGH (ref 3.8–10.8)

## 2023-09-18 LAB — BASIC METABOLIC PANEL WITHOUT GFR
BUN: 14 mg/dL (ref 7–25)
CO2: 25 mmol/L (ref 20–32)
Calcium: 10.4 mg/dL — ABNORMAL HIGH (ref 8.6–10.3)
Chloride: 102 mmol/L (ref 98–110)
Creat: 1.04 mg/dL (ref 0.70–1.35)
Glucose, Bld: 143 mg/dL — ABNORMAL HIGH (ref 65–99)
Potassium: 3.5 mmol/L (ref 3.5–5.3)
Sodium: 138 mmol/L (ref 135–146)

## 2023-09-18 LAB — C-REACTIVE PROTEIN: CRP: 5.7 mg/L (ref <8.0)

## 2023-09-18 LAB — SEDIMENTATION RATE: Sed Rate: 6 mm/h (ref 0–20)

## 2023-09-24 DIAGNOSIS — M25512 Pain in left shoulder: Secondary | ICD-10-CM | POA: Diagnosis not present

## 2023-10-13 DIAGNOSIS — M47812 Spondylosis without myelopathy or radiculopathy, cervical region: Secondary | ICD-10-CM | POA: Diagnosis not present

## 2023-10-13 DIAGNOSIS — M5030 Other cervical disc degeneration, unspecified cervical region: Secondary | ICD-10-CM | POA: Diagnosis not present

## 2023-10-13 DIAGNOSIS — G9520 Unspecified cord compression: Secondary | ICD-10-CM | POA: Diagnosis not present

## 2023-10-17 DIAGNOSIS — H43812 Vitreous degeneration, left eye: Secondary | ICD-10-CM | POA: Diagnosis not present

## 2023-11-07 ENCOUNTER — Other Ambulatory Visit: Payer: Medicare Other

## 2023-11-07 DIAGNOSIS — K760 Fatty (change of) liver, not elsewhere classified: Secondary | ICD-10-CM | POA: Diagnosis not present

## 2023-11-07 DIAGNOSIS — Z0000 Encounter for general adult medical examination without abnormal findings: Secondary | ICD-10-CM | POA: Diagnosis not present

## 2023-11-07 DIAGNOSIS — K7581 Nonalcoholic steatohepatitis (NASH): Secondary | ICD-10-CM | POA: Diagnosis not present

## 2023-11-07 DIAGNOSIS — G4730 Sleep apnea, unspecified: Secondary | ICD-10-CM | POA: Diagnosis not present

## 2023-11-07 DIAGNOSIS — Z13220 Encounter for screening for lipoid disorders: Secondary | ICD-10-CM | POA: Diagnosis not present

## 2023-11-07 DIAGNOSIS — R7303 Prediabetes: Secondary | ICD-10-CM | POA: Diagnosis not present

## 2023-11-07 LAB — COMPREHENSIVE METABOLIC PANEL WITH GFR
AG Ratio: 2 (calc) (ref 1.0–2.5)
ALT: 48 U/L — ABNORMAL HIGH (ref 9–46)
AST: 32 U/L (ref 10–35)
Albumin: 4.9 g/dL (ref 3.6–5.1)
Alkaline phosphatase (APISO): 78 U/L (ref 35–144)
BUN: 19 mg/dL (ref 7–25)
CO2: 23 mmol/L (ref 20–32)
Calcium: 9.9 mg/dL (ref 8.6–10.3)
Chloride: 101 mmol/L (ref 98–110)
Creat: 0.76 mg/dL (ref 0.70–1.35)
Globulin: 2.4 g/dL (ref 1.9–3.7)
Glucose, Bld: 144 mg/dL — ABNORMAL HIGH (ref 65–99)
Potassium: 3.7 mmol/L (ref 3.5–5.3)
Sodium: 136 mmol/L (ref 135–146)
Total Bilirubin: 0.8 mg/dL (ref 0.2–1.2)
Total Protein: 7.3 g/dL (ref 6.1–8.1)
eGFR: 98 mL/min/1.73m2 (ref >=60)

## 2023-11-07 LAB — CBC WITH DIFFERENTIAL/PLATELET
Absolute Lymphocytes: 1517 {cells}/uL (ref 850–3900)
Absolute Monocytes: 593 {cells}/uL (ref 200–950)
Basophils Absolute: 54 {cells}/uL (ref 0–200)
Basophils Relative: 0.7 %
Eosinophils Absolute: 501 {cells}/uL — ABNORMAL HIGH (ref 15–500)
Eosinophils Relative: 6.5 %
HCT: 43.3 % (ref 38.5–50.0)
Hemoglobin: 14.6 g/dL (ref 13.2–17.1)
MCH: 30.2 pg (ref 27.0–33.0)
MCHC: 33.7 g/dL (ref 32.0–36.0)
MCV: 89.6 fL (ref 80.0–100.0)
MPV: 11.1 fL (ref 7.5–12.5)
Monocytes Relative: 7.7 %
Neutro Abs: 5036 {cells}/uL (ref 1500–7800)
Neutrophils Relative %: 65.4 %
Platelets: 228 Thousand/uL (ref 140–400)
RBC: 4.83 Million/uL (ref 4.20–5.80)
RDW: 12.5 % (ref 11.0–15.0)
Total Lymphocyte: 19.7 %
WBC: 7.7 Thousand/uL (ref 3.8–10.8)

## 2023-11-07 LAB — LIPID PANEL
Cholesterol: 173 mg/dL (ref <200)
HDL: 39 mg/dL — ABNORMAL LOW (ref >=40)
LDL Cholesterol (Calc): 103 mg/dL — ABNORMAL HIGH
Non-HDL Cholesterol (Calc): 134 mg/dL — ABNORMAL HIGH (ref <130)
Total CHOL/HDL Ratio: 4.4 (calc) (ref <5.0)
Triglycerides: 193 mg/dL — ABNORMAL HIGH (ref <150)

## 2023-11-07 LAB — PSA: PSA: 0.62 ng/mL (ref <=4.00)

## 2023-11-10 ENCOUNTER — Ambulatory Visit: Payer: Self-pay | Admitting: Family Medicine

## 2023-11-11 ENCOUNTER — Encounter: Payer: Self-pay | Admitting: Family Medicine

## 2023-11-11 ENCOUNTER — Ambulatory Visit (INDEPENDENT_AMBULATORY_CARE_PROVIDER_SITE_OTHER): Payer: Medicare Other | Admitting: Family Medicine

## 2023-11-11 VITALS — BP 120/64 | HR 78 | Temp 97.9°F | Ht 74.0 in | Wt 230.2 lb

## 2023-11-11 DIAGNOSIS — R0989 Other specified symptoms and signs involving the circulatory and respiratory systems: Secondary | ICD-10-CM | POA: Diagnosis not present

## 2023-11-11 DIAGNOSIS — R7303 Prediabetes: Secondary | ICD-10-CM | POA: Diagnosis not present

## 2023-11-11 DIAGNOSIS — K760 Fatty (change of) liver, not elsewhere classified: Secondary | ICD-10-CM | POA: Diagnosis not present

## 2023-11-11 DIAGNOSIS — Z0001 Encounter for general adult medical examination with abnormal findings: Secondary | ICD-10-CM | POA: Diagnosis not present

## 2023-11-11 DIAGNOSIS — I10 Essential (primary) hypertension: Secondary | ICD-10-CM

## 2023-11-11 DIAGNOSIS — Z0000 Encounter for general adult medical examination without abnormal findings: Secondary | ICD-10-CM

## 2023-11-11 MED ORDER — ALPRAZOLAM 0.5 MG PO TABS: 0.5000 mg | ORAL_TABLET | Freq: Three times a day (TID) | ORAL | 2 refills | Status: DC | PRN

## 2023-11-11 NOTE — Progress Notes (Signed)
 Subjective:    Patient ID: Nathan Stewart, male    DOB: [birth year], 68 y.o.   MRN: [id number]  HPI  Patient is a very pleasant 68 year old African-American gentleman here today for a physical exam.  He denies any falls or depression or memory loss.  He has a history of remote smoking although he quit 1999.  He is due for colonoscopy in 2025.  He is due for a PSA.  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  Patient recently completed IV antibiotics for septic arthritis in his left shoulder.  He was long long-term outpatient IV antibiotics for this.  He also has a history of spinal cord compression in the cervical spine with resultant numbness and tingling in his upper extremities particular his fingertips.  His most recent lab work is listed below Lab on 11/07/2023  Component Date Value Ref Range Status   WBC 11/07/2023 7.7  3.8 - 10.8 Thousand/uL Final   RBC 11/07/2023 4.83  4.20 - 5.80 Million/uL Final   Hemoglobin 11/07/2023 14.6  13.2 - 17.1 g/dL Final   HCT 90/87/7974 43.3  38.5 - 50.0 % Final   MCV 11/07/2023 89.6  80.0 - 100.0 fL Final   MCH 11/07/2023 30.2  27.0 - 33.0 pg Final   MCHC 11/07/2023 33.7  32.0 - 36.0 g/dL Final   Comment: For adults, a slight decrease in the calculated MCHC value (in the range of 30 to 32 g/dL) is most likely not clinically significant; however, it should be interpreted with caution in correlation with other red cell parameters and the patient's clinical condition.    RDW 11/07/2023 12.5  11.0 - 15.0 % Final   Platelets 11/07/2023 228  140 - 400 Thousand/uL Final   MPV 11/07/2023 11.1  7.5 - 12.5 fL Final   Neutro Abs 11/07/2023 5,036  1,500 - 7,800 cells/uL Final   Absolute Lymphocytes 11/07/2023 1,517  850 - 3,900 cells/uL Final   Absolute Monocytes 11/07/2023 593  200 - 950 cells/uL Final   Eosinophils Absolute 11/07/2023 501 (H)  15 - 500 cells/uL Final   Basophils Absolute 11/07/2023 54  0 - 200 cells/uL  Final   Neutrophils Relative % 11/07/2023 65.4  % Final   Total Lymphocyte 11/07/2023 19.7  % Final   Monocytes Relative 11/07/2023 7.7  % Final   Eosinophils Relative 11/07/2023 6.5  % Final   Basophils Relative 11/07/2023 0.7  % Final   Glucose, Bld 11/07/2023 144 (H)  65 - 99 mg/dL Final   Comment: .            Fasting reference interval . For someone without known diabetes, a glucose value >125 mg/dL indicates that they may have diabetes and this should be confirmed with a follow-up test. .    BUN 11/07/2023 19  7 - 25 mg/dL Final   Creat 90/87/7974 0.76  0.70 - 1.35 mg/dL Final   eGFR 90/87/7974 98  > OR = 60 mL/min/1.70m2 Final   BUN/Creatinine Ratio 11/07/2023 SEE NOTE:  6 - 22 (calc) Final   Comment:    Not Reported: BUN and Creatinine are within    reference range. .    Sodium 11/07/2023 136  135 - 146 mmol/L Final   Potassium 11/07/2023 3.7  3.5 - 5.3 mmol/L Final   Chloride 11/07/2023 101  98 - 110 mmol/L Final   CO2 11/07/2023 23  20 - 32 mmol/L Final   Calcium 11/07/2023 9.9  8.6 -  10.3 mg/dL Final   Total Protein 90/87/7974 7.3  6.1 - 8.1 g/dL Final   Albumin 90/87/7974 4.9  3.6 - 5.1 g/dL Final   Globulin 90/87/7974 2.4  1.9 - 3.7 g/dL (calc) Final   AG Ratio 11/07/2023 2.0  1.0 - 2.5 (calc) Final   Total Bilirubin 11/07/2023 0.8  0.2 - 1.2 mg/dL Final   Alkaline phosphatase (APISO) 11/07/2023 78  35 - 144 U/L Final   AST 11/07/2023 32  10 - 35 U/L Final   ALT 11/07/2023 48 (H)  9 - 46 U/L Final   Cholesterol 11/07/2023 173  <200 mg/dL Final   HDL 90/87/7974 39 (L)  > OR = 40 mg/dL Final   Triglycerides 90/87/7974 193 (H)  <150 mg/dL Final   LDL Cholesterol (Calc) 11/07/2023 103 (H)  mg/dL (calc) Final   Comment: Reference range: <100 . Desirable range <100 mg/dL for primary prevention;   <70 mg/dL for patients with CHD or diabetic patients  with > or = 2 CHD risk factors. SABRA LDL-C is now calculated using the Martin-Hopkins  calculation, which is a  validated novel method providing  better accuracy than the Friedewald equation in the  estimation of LDL-C.  Gladis APPLETHWAITE et al. SANDREA. 7986;689(80): 2061-2068  (http://education.[facility].com/faq/FAQ164)    Total CHOL/HDL Ratio 11/07/2023 4.4  <4.9 (calc) Final   Non-HDL Cholesterol (Calc) 11/07/2023 134 (H)  <130 mg/dL (calc) Final   Comment: For patients with diabetes plus 1 major ASCVD risk  factor, treating to a non-HDL-C goal of <100 mg/dL  (LDL-C of <29 mg/dL) is considered a therapeutic  option.    PSA 11/07/2023 0.62  < OR = 4.00 ng/mL Final   Comment: The total PSA value from this assay system is  standardized against the WHO standard. The test  result will be approximately 20% lower when compared  to the equimolar-standardized total PSA (Beckman  Coulter). Comparison of serial PSA results should be  interpreted with this fact in mind. . This test was performed using the Siemens  chemiluminescent method. Values obtained from  different assay methods cannot be used interchangeably. PSA levels, regardless of value, should not be interpreted as absolute evidence of the presence or absence of disease.      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   GEN OSTEOARTHROSIS INVOLVING MULTIPLE SITES 11/10/2007   GERD 01/01/2007   Hx of adenomatous polyp of colon 04/04/2016   Hypertension    INSOMNIA, CHRONIC 11/13/2009   LIVER FUNCTION TESTS, ABNORMAL 08/27/2007   LOC OSTEOARTHROS NOT SPEC PRIM/SEC LOWER LEG 11/10/2007   Memory loss 08/27/2007   MENISCUS TEAR 10/30/2006   MSSA (methicillin susceptible Staphylococcus aureus) infection 04/29/2023   OSTEOARTHRITIS 06/07/2008   Pneumonia    Septic arthritis of shoulder, left (HCC) 09/16/2023   SLEEP APNEA 09/20/2009   no cpap   TESTICULAR HYPOFUNCTION 08/14/2009    Past Surgical History:  Procedure Laterality Date   COLONOSCOPY     COLONOSCOPY WITH PROPOFOL  N/A 05/09/2023   Procedure: COLONOSCOPY WITH PROPOFOL ;  Surgeon: Eartha Angelia Sieving, MD;  Location: [facility];  Service: Gastroenterology;  Laterality: N/A;  10:45AM;ASA 1   HAND SURGERY  over 20 years ago   'metal plate in wrist'   IRRIGATION AND DEBRIDEMENT SHOULDER Left 04/03/2023   Procedure: IRRIGATION AND DEBRIDEMENT SHOULDER;  Surgeon: Dozier Soulier, MD;  Location: [facility];  Service: Orthopedics;  Laterality: Left;   JOINT REPLACEMENT N/A    Phreesia 10/19/2019   MULTIPLE TOOTH EXTRACTIONS     POLYPECTOMY  05/09/2023   Procedure: POLYPECTOMY;  Surgeon: Eartha Angelia Sieving, MD;  Location: [facility];  Service: Gastroenterology;;   SHOULDER SURGERY Right 2021   ligament damage   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   Procedure: TOTAL KNEE ARTHROPLASTY;  Surgeon: Dempsey Sensor, MD;  Location: [facility];  Service: Orthopedics;  Laterality: Left;   TOTAL KNEE ARTHROPLASTY Right 05/21/2021   Procedure: RIGHT TOTAL KNEE ARTHROPLASTY;  Surgeon: Sensor Dempsey, MD;  Location: [facility];  Service: Orthopedics;  Laterality: Right;   TOTAL SHOULDER REVISION Left 03/11/2023   Procedure: LEFT SHOULDER REVISION GLENOID COMPONENT;  Surgeon: Dozier Soulier, MD;  Location: [facility];  Service: Orthopedics;  Laterality: Left;   TOTAL SHOULDER REVISION Left 04/03/2023   Procedure: SHOULDER REVISION;  Surgeon: Dozier Soulier, MD;  Location: [facility];  Service: Orthopedics;  Laterality: Left;   Current Outpatient Medications on File Prior to Visit  Medication Sig Dispense Refill   ALPRAZolam  (XANAX ) 0.5 MG tablet TAKE 1 TABLET(0.5 MG) BY MOUTH THREE TIMES DAILY AS NEEDED FOR ANXIETY 30 tablet 2   amLODipine  (NORVASC ) 5 MG tablet TAKE 1 TABLET(5 MG) BY MOUTH DAILY 90 tablet 3   cefadroxil  (DURICEF) 500 MG capsule Take 2 capsules (1,000 mg total) by mouth 2 (two) times daily. 120 capsule 9    ELDERBERRY PO Take 1 tablet by mouth daily.     Garlic 1000 MG CAPS Take 1,000 mg by mouth daily.     hydrochlorothiazide  (HYDRODIURIL ) 25 MG tablet TAKE 1 TABLET(25 MG) BY MOUTH DAILY 90 tablet 1   Magnesium 250 MG TABS Take 250 mg by mouth daily.     methocarbamol  (ROBAXIN ) 500 MG tablet Take 1 tablet (500 mg total) by mouth every 6 (six) hours as needed for muscle spasms. 20 tablet 0   metoprolol  succinate (TOPROL -XL) 100 MG 24 hr tablet TAKE 1 TABLET BY MOUTH EVERY DAY WITH OR IMMEDIATELY FOLLOWING A MEAL 90 tablet 1   Multiple Vitamin (MULTIVITAMIN) tablet Take 1 tablet by mouth daily. Adult 50+     Omega-3 Fatty Acids (FISH OIL) 1000 MG CPDR Take 2,000 mg by mouth daily.     ondansetron  (ZOFRAN ) 4 MG tablet Take 1 tablet (4 mg total) by mouth every 6 (six) hours as needed for nausea. 20 tablet 0   oxyCODONE  (OXY IR/ROXICODONE ) 5 MG immediate release tablet Take 1 tablet (5 mg total) by mouth every 6 (six) hours as needed for moderate pain (pain score 4-6). 20 tablet 0   vitamin C (ASCORBIC ACID) 250 MG tablet Take 250 mg by mouth daily.     Current Facility-Administered Medications on File Prior to Visit  Medication Dose Route Frequency Provider Last Rate Last Admin   0.9 %  sodium chloride  infusion  500 mL Intravenous Continuous Avram Lupita BRAVO, MD       .all Social History   Socioeconomic History   Marital status: Married    Spouse name: Not on file   Number of children: Not on file   Years of education: Not on file   Highest education level: Not on file  Occupational History   Not on file  Tobacco Use   Smoking status: Former    Current packs/day: 0.00    Types: Cigarettes    Quit date: 02/25/1997    Years since quitting: 26.7    Passive  exposure: Current   Smokeless tobacco: Never   Tobacco comments:    quit 1999  Vaping Use   Vaping status: Never Used  Substance and Sexual Activity   Alcohol use: Yes    Alcohol/week: 6.0 standard drinks of alcohol    Types: 6 Cans  of beer per week    Comment: occasional   Drug use: No   Sexual activity: Yes  Other Topics Concern   Not on file  Social History Narrative   Not on file   Social Drivers of Health   Financial Resource Strain: Low Risk  (12/07/2022)   [facility]   Overall Financial Resource Strain (CARDIA)    Difficulty of Paying Living Expenses: Not hard at all  Food Insecurity: No Food Insecurity (04/03/2023)   Hunger Vital Sign    Worried About Running Out of Food in the Last Year: Never true    Ran Out of Food in the Last Year: Never true  Transportation Needs: No Transportation Needs (04/03/2023)   PRAPARE - Administrator, Civil Service (Medical): No    Lack of Transportation (Non-Medical): No  Physical Activity: Unknown (12/07/2022)   Received from [facility]   Exercise Vital Sign    On average, how many days per week do you engage in moderate to strenuous exercise (like a brisk walk)?: 3 days    Minutes of Exercise per Session: Not on file  Recent Concern: Physical Activity - Inactive (11/28/2022)   Exercise Vital Sign    Days of Exercise per Week: 0 days    Minutes of Exercise per Session: 0 min  Stress: No Stress Concern Present (11/28/2022)   [employer] of Occupational Health - Occupational Stress Questionnaire    Feeling of Stress : Not at all  Social Connections: Socially Integrated (04/03/2023)   Social Connection and Isolation Panel    Frequency of Communication with Friends and Family: More than three times a week    Frequency of Social Gatherings with Friends and Family: Three times a week    Attends Religious Services: More than 4 times per year    Active Member of Clubs or Organizations: Yes    Attends Banker Meetings: More than 4 times per year    Marital Status: Married  Catering manager Violence: Not At Risk (04/03/2023)   Humiliation, Afraid, Rape, and Kick questionnaire    Fear of Current or Ex-Partner: No    Emotionally  Abused: No    Physically Abused: No    Sexually Abused: No   Family History  Problem Relation Age of Onset   Arthritis Mother    Heart disease Mother    Hypertension Mother    Hypertension Father    Stroke Father    Hypertension Sister    Stroke Brother    Colon cancer Paternal Uncle       Review of Systems  All other systems reviewed and are negative.      Objective:   Physical Exam Vitals reviewed.  Constitutional:      General: He is not in acute distress.    Appearance: Normal appearance. He is normal weight. He is not ill-appearing or toxic-appearing.  HENT:     Head: Normocephalic and atraumatic.     Right Ear: Tympanic membrane and ear canal normal.     Left Ear: Tympanic membrane and ear canal normal.     Nose: Nose normal. No congestion or rhinorrhea.     Mouth/Throat:  Mouth: Mucous membranes are moist.     Pharynx: Oropharynx is clear. No oropharyngeal exudate or posterior oropharyngeal erythema.  Eyes:     Extraocular Movements: Extraocular movements intact.     Conjunctiva/sclera: Conjunctivae normal.     Pupils: Pupils are equal, round, and reactive to light.  Neck:     Vascular: No carotid bruit.  Cardiovascular:     Rate and Rhythm: Normal rate and regular rhythm.     Pulses: Normal pulses.     Heart sounds: Normal heart sounds. No murmur heard.    No friction rub. No gallop.  Pulmonary:     Effort: Pulmonary effort is normal. No respiratory distress.     Breath sounds: Normal breath sounds. No wheezing, rhonchi or rales.  Abdominal:     General: Abdomen is flat. Bowel sounds are normal. There is no distension.     Palpations: Abdomen is soft.     Tenderness: There is no abdominal tenderness. There is no rebound.     Hernia: No hernia is present.  Musculoskeletal:     Cervical back: Neck supple.     Right lower leg: No edema.     Left lower leg: No edema.  Lymphadenopathy:     Cervical: No cervical adenopathy.  Skin:    Coloration:  Skin is not jaundiced or pale.     Findings: No bruising, erythema, lesion or rash.  Neurological:     General: No focal deficit present.     Mental Status: He is alert and oriented to person, place, and time. Mental status is at baseline.     Cranial Nerves: No cranial nerve deficit.     Motor: No weakness.     Gait: Gait normal.  Psychiatric:        Mood and Affect: Mood normal.        Behavior: Behavior normal.        Thought Content: Thought content normal.        Judgment: Judgment normal.           Assessment & Plan:  Routine general medical examination at a health care facility  Essential hypertension  NAFLD (nonalcoholic fatty liver disease)  Prediabetes - Plan: Hemoglobin A1c  Left carotid bruit - Plan: US  Carotid Duplex Bilateral Patient declines a flu shot today.  He declines the shingles vaccine.  We discussed the COVID shot.  His blood pressure today is acceptable.  Cholesterol is mildly elevated.  However cholesterol is not significant enough to warrant increase medication.  I am concerned about his elevated fasting blood sugar.  I will add a hemoglobin A1c.  I recommended reduction in his consumption of carbohydrates.  Colonoscopy is up-to-date.  PSA is normal.  Repeat colonoscopy is due in 3 years due to the presence of several tubular adenomas.

## 2023-11-12 LAB — HEMOGLOBIN A1C
Hgb A1c MFr Bld: 5.8 % — ABNORMAL HIGH (ref <5.7)
Mean Plasma Glucose: 120 mg/dL
eAG (mmol/L): 6.6 mmol/L

## 2023-11-13 ENCOUNTER — Ambulatory Visit: Payer: Self-pay | Admitting: Family Medicine

## 2023-11-13 ENCOUNTER — Ambulatory Visit
Admission: RE | Admit: 2023-11-13 | Discharge: 2023-11-13 | Disposition: A | Discharge status: Home / Self Care | Source: Ambulatory Visit | Attending: Family Medicine | Admitting: Family Medicine

## 2023-11-13 DIAGNOSIS — R0989 Other specified symptoms and signs involving the circulatory and respiratory systems: Secondary | ICD-10-CM

## 2023-11-13 DIAGNOSIS — I6523 Occlusion and stenosis of bilateral carotid arteries: Secondary | ICD-10-CM | POA: Diagnosis not present

## 2023-12-03 NOTE — Telephone Encounter (Signed)
 Lm message to confirm / Telephone visit for AWV

## 2023-12-04 ENCOUNTER — Ambulatory Visit: Payer: Medicare Other

## 2023-12-04 VITALS — Ht 74.0 in | Wt 230.0 lb

## 2023-12-04 DIAGNOSIS — Z0000 Encounter for general adult medical examination without abnormal findings: Secondary | ICD-10-CM | POA: Diagnosis not present

## 2023-12-04 NOTE — Patient Instructions (Signed)
 Mr. Schools , Thank you for taking time to come for your Medicare Wellness Visit. I appreciate your ongoing commitment to your health goals. Please review the following plan we discussed and let me know if I can assist you in the future.   Screening recommendations/referrals: Colonoscopy: up  to date Recommended yearly ophthalmology/optometry visit for glaucoma screening and checkup Recommended yearly dental visit for hygiene and checkup  Vaccinations: Influenza vaccine: Education provided Pneumococcal vaccine: up to date Tdap vaccine: up to date Shingles vaccine: Education provided      Preventive Care 65 Years and Older, Male Preventive care refers to lifestyle choices and visits with your health care provider that can promote health and wellness. What does preventive care include? A yearly physical exam. This is also called an annual well check. Dental exams once or twice a year. Routine eye exams. Ask your health care provider how often you should have your eyes checked. Personal lifestyle choices, including: Daily care of your teeth and gums. Regular physical activity. Eating a healthy diet. Avoiding tobacco and drug use. Limiting alcohol use. Practicing safe sex. Taking low doses of aspirin  every day. Taking vitamin and mineral supplements as recommended by your health care provider. What happens during an annual well check? The services and screenings done by your health care provider during your annual well check will depend on your age, overall health, lifestyle risk factors, and family history of disease. Counseling  Your health care provider may ask you questions about your: Alcohol use. Tobacco use. Drug use. Emotional well-being. Home and relationship well-being. Sexual activity. Eating habits. History of falls. Memory and ability to understand (cognition). Work and work Astronomer. Screening  You may have the following tests or measurements: Height,  weight, and BMI. Blood pressure. Lipid and cholesterol levels. These may be checked every 5 years, or more frequently if you are over 85 years old. Skin check. Lung cancer screening. You may have this screening every year starting at age 36 if you have a 30-pack-year history of smoking and currently smoke or have quit within the past 15 years. Fecal occult blood test (FOBT) of the stool. You may have this test every year starting at age 29. Flexible sigmoidoscopy or colonoscopy. You may have a sigmoidoscopy every 5 years or a colonoscopy every 10 years starting at age 32. Prostate cancer screening. Recommendations will vary depending on your family history and other risks. Hepatitis C blood test. Hepatitis B blood test. Sexually transmitted disease (STD) testing. Diabetes screening. This is done by checking your blood sugar (glucose) after you have not eaten for a while (fasting). You may have this done every 1-3 years. Abdominal aortic aneurysm (AAA) screening. You may need this if you are a current or former smoker. Osteoporosis. You may be screened starting at age 77 if you are at high risk. Talk with your health care provider about your test results, treatment options, and if necessary, the need for more tests. Vaccines  Your health care provider may recommend certain vaccines, such as: Influenza vaccine. This is recommended every year. Tetanus, diphtheria, and acellular pertussis (Tdap, Td) vaccine. You may need a Td booster every 10 years. Zoster vaccine. You may need this after age 18. Pneumococcal 13-valent conjugate (PCV13) vaccine. One dose is recommended after age 81. Pneumococcal polysaccharide (PPSV23) vaccine. One dose is recommended after age 48. Talk to your health care provider about which screenings and vaccines you need and how often you need them. This information is not intended to  replace advice given to you by your health care provider. Make sure you discuss any  questions you have with your health care provider. Document Released: 03/10/2015 Document Revised: 11/01/2015 Document Reviewed: 12/13/2014 Elsevier Interactive Patient Education  2017 [employer].  Fall Prevention in the Home Falls can cause injuries. They can happen to people of all ages. There are many things you can do to make your home safe and to help prevent falls. What can I do on the outside of my home? Regularly fix the edges of walkways and driveways and fix any cracks. Remove anything that might make you trip as you walk through a door, such as a raised step or threshold. Trim any bushes or trees on the path to your home. Use bright outdoor lighting. Clear any walking paths of anything that might make someone trip, such as rocks or tools. Regularly check to see if handrails are loose or broken. Make sure that both sides of any steps have handrails. Any raised decks and porches should have guardrails on the edges. Have any leaves, snow, or ice cleared regularly. Use sand or salt on walking paths during winter. Clean up any spills in your garage right away. This includes oil or grease spills. What can I do in the bathroom? Use night lights. Install grab bars by the toilet and in the tub and shower. Do not use towel bars as grab bars. Use non-skid mats or decals in the tub or shower. If you need to sit down in the shower, use a plastic, non-slip stool. Keep the floor dry. Clean up any water  that spills on the floor as soon as it happens. Remove soap buildup in the tub or shower regularly. Attach bath mats securely with double-sided non-slip rug tape. Do not have throw rugs and other things on the floor that can make you trip. What can I do in the bedroom? Use night lights. Make sure that you have a light by your bed that is easy to reach. Do not use any sheets or blankets that are too big for your bed. They should not hang down onto the floor. Have a firm chair that has side  arms. You can use this for support while you get dressed. Do not have throw rugs and other things on the floor that can make you trip. What can I do in the kitchen? Clean up any spills right away. Avoid walking on wet floors. Keep items that you use a lot in easy-to-reach places. If you need to reach something above you, use a strong step stool that has a grab bar. Keep electrical cords out of the way. Do not use floor polish or wax that makes floors slippery. If you must use wax, use non-skid floor wax. Do not have throw rugs and other things on the floor that can make you trip. What can I do with my stairs? Do not leave any items on the stairs. Make sure that there are handrails on both sides of the stairs and use them. Fix handrails that are broken or loose. Make sure that handrails are as long as the stairways. Check any carpeting to make sure that it is firmly attached to the stairs. Fix any carpet that is loose or worn. Avoid having throw rugs at the top or bottom of the stairs. If you do have throw rugs, attach them to the floor with carpet tape. Make sure that you have a light switch at the top of the stairs and the  bottom of the stairs. If you do not have them, ask someone to add them for you. What else can I do to help prevent falls? Wear shoes that: Do not have high heels. Have rubber bottoms. Are comfortable and fit you well. Are closed at the toe. Do not wear sandals. If you use a stepladder: Make sure that it is fully opened. Do not climb a closed stepladder. Make sure that both sides of the stepladder are locked into place. Ask someone to hold it for you, if possible. Clearly mark and make sure that you can see: Any grab bars or handrails. First and last steps. Where the edge of each step is. Use tools that help you move around (mobility aids) if they are needed. These include: Canes. Walkers. Scooters. Crutches. Turn on the lights when you go into a dark area.  Replace any light bulbs as soon as they burn out. Set up your furniture so you have a clear path. Avoid moving your furniture around. If any of your floors are uneven, fix them. If there are any pets around you, be aware of where they are. Review your medicines with your doctor. Some medicines can make you feel dizzy. This can increase your chance of falling. Ask your doctor what other things that you can do to help prevent falls. This information is not intended to replace advice given to you by your health care provider. Make sure you discuss any questions you have with your health care provider. Document Released: 12/08/2008 Document Revised: 07/20/2015 Document Reviewed: 03/18/2014 Elsevier Interactive Patient Education  2017 [employer].

## 2023-12-04 NOTE — Progress Notes (Signed)
 Subjective:   Nathan Stewart is a 68 y.o. male who presents for Medicare Annual/Subsequent preventive examination.  Visit Complete: Virtual I connected with  Nathan Stewart on 12/04/23 by a audio enabled telemedicine application and verified that I am speaking with the correct person using two identifiers.  Patient Location: Home  Provider Location: Home Office  I discussed the limitations of evaluation and management by telemedicine. The patient expressed understanding and agreed to proceed.  Vital Signs: Because this visit was a virtual/telehealth visit, some criteria may be missing or patient reported. Any vitals not documented were not able to be obtained and vitals that have been documented are patient reported.  Patient Medicare AWV questionnaire was completed by the patient on 12-01-2023; I have confirmed that all information answered by patient is correct and no changes since this date.  Cardiac Risk Factors include: advanced age (>32men, >40 women);hypertension;male gender;obesity (BMI >30kg/m2)     Objective:    Today's Vitals   12/04/23 1005  Weight: 230 lb (104.3 kg)  Height: 6' 2 (1.88 m)   Body mass index is 29.53 kg/m.     12/04/2023   10:04 AM 05/09/2023    9:18 AM 04/02/2023    1:52 PM 03/11/2023   11:07 AM 03/10/2023   12:54 PM 11/28/2022   12:03 PM 05/11/2021    9:20 AM  Advanced Directives  Does Patient Have a Medical Advance Directive? No Yes Yes Yes Yes No Yes  Type of Advance Directive  Living will Healthcare Power of [place];Living will Healthcare Power of [place];Living will Healthcare Power of [place];Living will  Healthcare Power of [place];Living will  Does patient want to make changes to medical advance directive?   No - Patient declined      Copy of Healthcare Power of Attorney in Chart?   No - copy requested No - copy requested No - copy requested  No - copy requested  Would patient like information on creating a medical advance directive? No -  Patient declined     Yes (MAU/Ambulatory/Procedural Areas - Information given)     Current Medications (verified) Outpatient Encounter Medications as of 12/04/2023  Medication Sig   ALPRAZolam  (XANAX ) 0.5 MG tablet Take 1 tablet (0.5 mg total) by mouth 3 (three) times daily as needed for anxiety.   amLODipine  (NORVASC ) 5 MG tablet TAKE 1 TABLET(5 MG) BY MOUTH DAILY   cefadroxil  (DURICEF) 500 MG capsule Take 2 capsules (1,000 mg total) by mouth 2 (two) times daily.   ELDERBERRY PO Take 1 tablet by mouth daily.   Garlic 1000 MG CAPS Take 1,000 mg by mouth daily.   hydrochlorothiazide  (HYDRODIURIL ) 25 MG tablet TAKE 1 TABLET(25 MG) BY MOUTH DAILY   Magnesium 250 MG TABS Take 250 mg by mouth daily.   metoprolol  succinate (TOPROL -XL) 100 MG 24 hr tablet TAKE 1 TABLET BY MOUTH EVERY DAY WITH OR IMMEDIATELY FOLLOWING A MEAL   Multiple Vitamin (MULTIVITAMIN) tablet Take 1 tablet by mouth daily. Adult 50+   Omega-3 Fatty Acids (FISH OIL) 1000 MG CPDR Take 2,000 mg by mouth daily.   vitamin C (ASCORBIC ACID) 250 MG tablet Take 250 mg by mouth daily.   methocarbamol  (ROBAXIN ) 500 MG tablet Take 1 tablet (500 mg total) by mouth every 6 (six) hours as needed for muscle spasms. (Patient not taking: Reported on 11/11/2023)   ondansetron  (ZOFRAN ) 4 MG tablet Take 1 tablet (4 mg total) by mouth every 6 (six) hours as needed for nausea. (Patient not taking:  Reported on 11/11/2023)   oxyCODONE  (OXY IR/ROXICODONE ) 5 MG immediate release tablet Take 1 tablet (5 mg total) by mouth every 6 (six) hours as needed for moderate pain (pain score 4-6). 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   GEN OSTEOARTHROSIS INVOLVING MULTIPLE SITES 11/10/2007   GERD 01/01/2007   Hx of adenomatous polyp of colon 04/04/2016   Hypertension    INSOMNIA, CHRONIC 11/13/2009   LIVER FUNCTION TESTS, ABNORMAL 08/27/2007   LOC OSTEOARTHROS NOT SPEC PRIM/SEC LOWER LEG 11/10/2007   Memory loss 08/27/2007   MENISCUS TEAR 10/30/2006   MSSA (methicillin susceptible Staphylococcus aureus) infection 04/29/2023   OSTEOARTHRITIS 06/07/2008   Pneumonia    Septic arthritis of shoulder, left (HCC) 09/16/2023   SLEEP APNEA 09/20/2009   no cpap   TESTICULAR HYPOFUNCTION 08/14/2009   Past Surgical History:  Procedure Laterality Date   COLONOSCOPY     COLONOSCOPY WITH PROPOFOL  N/A 05/09/2023   Procedure: COLONOSCOPY WITH PROPOFOL ;  Surgeon: Eartha Angelia Sieving, MD;  Location: [facility];  Service: Gastroenterology;  Laterality: N/A;  10:45AM;ASA 1   HAND SURGERY  over 20 years ago   'metal plate in wrist'   IRRIGATION AND DEBRIDEMENT SHOULDER Left 04/03/2023   Procedure: IRRIGATION AND DEBRIDEMENT SHOULDER;  Surgeon: Dozier Soulier, MD;  Location: [facility];  Service: Orthopedics;  Laterality: Left;   JOINT REPLACEMENT N/A    [employer] 10/19/2019   MULTIPLE TOOTH EXTRACTIONS     POLYPECTOMY  05/09/2023   Procedure: POLYPECTOMY;  Surgeon: Eartha Angelia Sieving, MD;  Location: [facility];  Service: Gastroenterology;;   SHOULDER SURGERY Right 2021   ligament damage   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   Procedure: TOTAL KNEE ARTHROPLASTY;  Surgeon: Dempsey Sensor, MD;  Location[facility];  Service: Orthopedics;  Laterality: Left;   TOTAL KNEE ARTHROPLASTY Right 05/21/2021   Procedure: RIGHT TOTAL KNEE ARTHROPLASTY;  Surgeon: Sensor Dempsey, MD;  Location: [facility];  Service: Orthopedics;  Laterality: Right;   TOTAL SHOULDER REVISION Left 03/11/2023   Procedure: LEFT  SHOULDER REVISION GLENOID COMPONENT;  Surgeon: Dozier Soulier, MD;  Location: [facility];  Service: Orthopedics;  Laterality: Left;   TOTAL SHOULDER REVISION Left 04/03/2023   Procedure: SHOULDER REVISION;  Surgeon: Dozier Soulier, MD;  Location: [facility];  Service: Orthopedics;  Laterality: Left;   Family History  Problem Relation Age of Onset   Arthritis Mother    Heart disease Mother    Hypertension Mother    Hypertension Father    Stroke Father    Hypertension Sister    Stroke Brother    Colon cancer Paternal Uncle    Social History   Socioeconomic History   Marital status: Married    Spouse name: Not on file   Number of children: Not on file   Years of education: Not on file   Highest education level: Not on file  Occupational History   Not on file  Tobacco Use   Smoking status: Former    Current packs/day: 0.00    Types: Cigarettes    Quit date: 02/25/1997    Years since quitting: 26.7  Passive exposure: Current   Smokeless tobacco: Never   Tobacco comments:    quit 1999  Vaping Use   Vaping status: Never Used  Substance and Sexual Activity   Alcohol use: Yes    Alcohol/week: 6.0 standard drinks of alcohol    Types: 6 Cans of beer per week    Comment: occasional   Drug use: No   Sexual activity: Yes  Other Topics Concern   Not on file  Social History Narrative   Not on file   Social Drivers of Health   Financial Resource Strain: Low Risk  (12/04/2023)   Overall Financial Resource Strain (CARDIA)    Difficulty of Paying Living Expenses: Not hard at all  Food Insecurity: No Food Insecurity (12/04/2023)   Hunger Vital Sign    Worried About Running Out of Food in the Last Year: Never true    Ran Out of Food in the Last Year: Never true  Transportation Needs: No Transportation Needs (12/04/2023)   PRAPARE - Administrator, Civil Service (Medical): No    Lack of Transportation (Non-Medical): No  Physical Activity: Inactive (12/04/2023)   Exercise  Vital Sign    Days of Exercise per Week: 0 days    Minutes of Exercise per Session: 0 min  Stress: No Stress Concern Present (12/04/2023)   [employer] of Occupational Health - Occupational Stress Questionnaire    Feeling of Stress: Not at all  Social Connections: Socially Integrated (12/04/2023)   Social Connection and Isolation Panel    Frequency of Communication with Friends and Family: More than three times a week    Frequency of Social Gatherings with Friends and Family: Three times a week    Attends Religious Services: More than 4 times per year    Active Member of Clubs or Organizations: Yes    Attends Engineer, structural: More than 4 times per year    Marital Status: Married    Tobacco Counseling Counseling given: Not Answered Tobacco comments: quit 1999   Clinical Intake:  Pre-visit preparation completed: Yes  Pain : No/denies pain     Diabetes: No  How often do you need to have someone help you when you read instructions, pamphlets, or other written materials from your doctor or pharmacy?: 1 - Never  Interpreter Needed?: No  Information entered by :: Mliss Graff LPN   Activities of Daily Living    12/04/2023   10:06 AM 12/01/2023    8:21 AM  In your present state of health, do you have any difficulty performing the following activities:  Hearing? 0 0  Vision? 0 0  Difficulty concentrating or making decisions? 0 0  Walking or climbing stairs? 0 0  Dressing or bathing? 0 0  Doing errands, shopping? 0 0  Preparing Food and eating ? N N  Using the Toilet? N N  In the past six months, have you accidently leaked urine? N N  Do you have problems with loss of bowel control? N N  Managing your Medications? N N  Managing your Finances? N N  Housekeeping or managing your Housekeeping? N N    Patient Care Team: [name], MD as PCP - General (Family Medicine) [facility] Zerita DASEN, MD as Consulting Physician (Ophthalmology) Bettyjane Rico CROME, NP  as Nurse Practitioner (Neurosurgery) Mariette Mitzie CROME, NP as Nurse Practitioner (Gastroenterology) [facility]  Indicate any recent Medical Services you may have received from other than [facility] providers in the past  year (date may be approximate).     Assessment:   This is a routine wellness examination for Jermar.  Hearing/Vision screen Hearing Screening - Comments:: No trouble hearing Vision Screening - Comments:: Up to date [facility]   Goals Addressed             This Visit's Progress    Complete shoulder revision surgery       Patient Stated       Stay healthy       Depression Screen    12/04/2023   10:06 AM 11/28/2022   12:00 PM 11/11/2022    9:26 AM 07/09/2022   10:36 AM 03/21/2022    3:35 PM 11/06/2021    8:25 AM 11/03/2020    8:30 AM  PHQ 2/9 Scores  PHQ - 2 Score 0 0 0 0 0 0 0  PHQ- 9 Score 0   0       Fall Risk    12/04/2023   10:03 AM 12/01/2023    8:21 AM 09/17/2023    3:24 PM 11/28/2022   12:03 PM 11/11/2022    9:26 AM  Fall Risk   Falls in the past year? 0 0 0 0 0  Number falls in past yr: 0 0 0 0   Injury with Fall? 0 0 0 0   Risk for fall due to :   No Fall Risks No Fall Risks   Follow up Falls evaluation completed;Education provided;Falls prevention discussed  Falls evaluation completed Falls prevention discussed;Education provided;Falls evaluation completed     MEDICARE RISK AT HOME: Medicare Risk at Home Any stairs in or around the home?: No If so, are there any without handrails?: No Home free of loose throw rugs in walkways, pet beds, electrical cords, etc?: Yes Adequate lighting in your home to reduce risk of falls?: Yes Life alert?: No Use of a cane, walker or w/c?: No Grab bars in the bathroom?: No Shower chair or bench in shower?: No Elevated toilet seat or a handicapped toilet?: No  TIMED UP AND GO:  Was the test performed?  No    Cognitive Function:        12/04/2023   10:05 AM 11/28/2022   12:11 PM   6CIT Screen  What Year? 0 points 0 points  What month? 0 points 0 points  What time? 0 points 0 points  Count back from 20 0 points 0 points  Months in reverse 0 points 0 points  Repeat phrase 0 points 0 points  Total Score 0 points 0 points    Immunizations Immunization History  Administered Date(s) Administered   Fluad Quad(high Dose 65+) 12/21/2020, 01/23/2022   Fluad Trivalent(High Dose 65+) 01/03/2023   Influenza Whole 12/01/2007   Influenza,inj,Quad PF,6+ Mos 01/25/2013, 01/05/2016, 12/22/2017, 11/13/2018, 12/17/2019   PFIZER Comirnaty (Gray Top)Covid-19 Tri-Sucrose Vaccine 07/25/2020   PFIZER(Purple Top)SARS-COV-2 Vaccination 05/16/2019, 06/06/2019, 12/31/2019   PNEUMOCOCCAL CONJUGATE-20 11/06/2021   Pfizer Covid-19 Vaccine Bivalent Booster 75yrs & up 01/05/2021   Pfizer(Comirnaty )Fall Seasonal Vaccine 12 years and older 01/23/2022   Td 12/27/1998, 02/05/2008   Tdap 10/09/2018   Zoster, Live 06/23/2015    TDAP status: Up to date  Flu Vaccine status: Due, Education has been provided regarding the importance of this vaccine. Advised may receive this vaccine at local pharmacy or Health Dept. Aware to provide a copy of the vaccination record if obtained from local pharmacy or Health Dept. Verbalized acceptance and understanding.  Pneumococcal vaccine status: Up to date  Covid-19 vaccine status:  Declined, Education has been provided regarding the importance of this vaccine but patient still declined. Advised may receive this vaccine at local pharmacy or Health Dept.or vaccine clinic. Aware to provide a copy of the vaccination record if obtained from local pharmacy or Health Dept. Verbalized acceptance and understanding.  Qualifies for Shingles Vaccine? Yes   Zostavax completed No   Shingrix Completed?: No.    Education has been provided regarding the importance of this vaccine. Patient has been advised to call insurance company to determine out of pocket expense if they have  not yet received this vaccine. Advised may also receive vaccine at local pharmacy or Health Dept. Verbalized acceptance and understanding.  Screening Tests Health Maintenance  Topic Date Due   Zoster Vaccines- Shingrix (1 of 2) 05/12/1974   Influenza Vaccine  09/26/2023   COVID-19 Vaccine (7 - 2025-26 season) 12/20/2023 (Originally 10/27/2023)   Medicare Annual Wellness (AWV)  12/03/2024   Colonoscopy  05/09/2026   DTaP/Tdap/Td (4 - Td or Tdap) 10/08/2028   Pneumococcal Vaccine: 50+ Years  Completed   Hepatitis C Screening  Completed   Meningococcal B Vaccine  Aged Out    Health Maintenance  Health Maintenance Due  Topic Date Due   Zoster Vaccines- Shingrix (1 of 2) 05/12/1974   Influenza Vaccine  09/26/2023    Colorectal cancer screening: Type of screening: Colonoscopy. Completed 2025. Repeat every 3 years  Lung Cancer Screening: (Low Dose CT Chest recommended if Age 67-80 years, 20 pack-year currently smoking OR have quit w/in 15years.) does not qualify.   Lung Cancer Screening Referral:   Additional Screening:  Hepatitis C Screening: does not qualify; Completed 2025  Vision Screening: Recommended annual ophthalmology exams for early detection of glaucoma and other disorders of the eye. Is the patient up to date with their annual eye exam?  Yes  Who is the provider or what is the name of the office in which the patient attends annual eye exams? [facility] If pt is not established with a provider, would they like to be referred to a provider to establish care? No .   Dental Screening: Recommended annual dental exams for proper oral hygiene   Community Resource Referral / Chronic Care Management: CRR required this visit?  No   CCM required this visit?  No     Plan:     I have personally reviewed and noted the following in the patient's chart:   Medical and social history Use of alcohol, tobacco or illicit drugs  Current medications and supplements including opioid  prescriptions. Patient is not currently taking opioid prescriptions. Functional ability and status Nutritional status Physical activity Advanced directives List of other physicians Hospitalizations, surgeries, and ER visits in previous 12 months Vitals Screenings to include cognitive, depression, and falls Referrals and appointments  In addition, I have reviewed and discussed with patient certain preventive protocols, quality metrics, and best practice recommendations. A written personalized care plan for preventive services as well as general preventive health recommendations were provided to patient.     Mliss Graff, LPN   [id number]/[id number]   After Visit Summary: (MyChart) Due to this being a telephonic visit, the after visit summary with patients personalized plan was offered to patient via MyChart   Nurse Notes:

## 2023-12-08 DIAGNOSIS — G9520 Unspecified cord compression: Secondary | ICD-10-CM | POA: Diagnosis not present

## 2023-12-08 DIAGNOSIS — M5030 Other cervical disc degeneration, unspecified cervical region: Secondary | ICD-10-CM | POA: Diagnosis not present

## 2023-12-10 ENCOUNTER — Encounter (INDEPENDENT_AMBULATORY_CARE_PROVIDER_SITE_OTHER): Payer: Self-pay | Admitting: Gastroenterology

## 2023-12-15 ENCOUNTER — Other Ambulatory Visit: Payer: Self-pay | Admitting: Family Medicine

## 2023-12-15 DIAGNOSIS — I10 Essential (primary) hypertension: Secondary | ICD-10-CM

## 2023-12-16 ENCOUNTER — Other Ambulatory Visit: Payer: Self-pay | Admitting: Family Medicine

## 2024-01-26 ENCOUNTER — Encounter: Payer: Self-pay | Admitting: Family Medicine

## 2024-01-27 ENCOUNTER — Ambulatory Visit: Admitting: Family Medicine

## 2024-01-27 ENCOUNTER — Encounter: Payer: Self-pay | Admitting: Family Medicine

## 2024-01-27 ENCOUNTER — Ambulatory Visit: Payer: Self-pay

## 2024-01-27 VITALS — BP 132/80 | HR 68 | Ht 74.0 in | Wt 228.8 lb

## 2024-01-27 DIAGNOSIS — N62 Hypertrophy of breast: Secondary | ICD-10-CM

## 2024-01-27 DIAGNOSIS — N644 Mastodynia: Secondary | ICD-10-CM

## 2024-01-27 NOTE — Telephone Encounter (Signed)
 FYI Only or Action Required?: FYI only for provider: appointment scheduled on this afternoon.  Patient was last seen in primary care on 11/11/2023 by Nathan Stewart DASEN, MD.  Called Nurse Triage reporting Breast Pain. Right nipple and behind right nipple with pressure or touching  Symptoms began about a month ago.  Interventions attempted: Nothing.  Symptoms are: gradually improving.  Triage Disposition: See PCP Within 2 Weeks  Patient/caregiver understands and will follow disposition?: Yes                    Copied from CRM [id number]. Topic: Clinical - Red Word Triage >> Jan 27, 2024  9:51 AM Larissa RAMAN wrote: [facility] that prompted transfer to Nurse Triage: pain- Rt breast Reason for Disposition  [1] Breast pain AND [2] cause is not known  Answer Assessment - Initial Assessment Questions 1. SYMPTOM: What's the main symptom you're concerned about?  (e.g., lump, nipple discharge, pain, rash)     Nipple pain -- sharp pain when pushed 2. LOCATION: Where is the pain located?     Right nipple and behind the nipple 3. ONSET: When did pain  start?     1 months 4. PRIOR HISTORY: Do you have any history of prior problems with your breasts? (e.g., breast cancer, breast implant, fibrocystic breast disease)     no 5. CAUSE: What do you think is causing this symptom?     unsure 6. OTHER SYMPTOMS: Do you have any other symptoms? (e.g., breast pain, fever, nipple discharge, redness or rash)     no  Protocols used: Breast Symptoms-A-AH

## 2024-01-27 NOTE — Progress Notes (Signed)
 Subjective:    Patient ID: Nathan Stewart, male    DOB: 03-17-1955, 68 y.o.   MRN: [id number]  Patient reports pain underneath his right nipple x 1 month.  He reports tenderness and hypersensitivity around the nipple.  He denies any nipple discharge.  Specifically he denies any bloody discharge or white milky discharge.  He denies any fevers or chills.  He has a history of hypogonadism.  Today on examination he does have mild gynecomastia bilaterally.  At approximately 1:00, there may be a small nodule that I suspect is a duct.  This does not cause the patient any pain when I palpate that area.  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   GEN OSTEOARTHROSIS INVOLVING MULTIPLE SITES 11/10/2007   GERD 01/01/2007   Hx of adenomatous polyp of colon 04/04/2016   Hypertension    INSOMNIA, CHRONIC 11/13/2009   LIVER FUNCTION TESTS, ABNORMAL 08/27/2007   LOC OSTEOARTHROS NOT SPEC PRIM/SEC LOWER LEG 11/10/2007   Memory loss 08/27/2007   MENISCUS TEAR 10/30/2006   MSSA (methicillin susceptible Staphylococcus aureus) infection 04/29/2023   OSTEOARTHRITIS 06/07/2008   Pneumonia    Septic arthritis of shoulder, left (HCC) 09/16/2023   SLEEP APNEA 09/20/2009   no cpap   TESTICULAR HYPOFUNCTION 08/14/2009   Past Surgical History:  Procedure Laterality Date   COLONOSCOPY     COLONOSCOPY WITH PROPOFOL  N/A 05/09/2023   Procedure: COLONOSCOPY WITH PROPOFOL ;  Surgeon: Eartha Angelia Sieving, MD;  Location: [facility];  Service: Gastroenterology;  Laterality: N/A;  10:45AM;ASA 1   HAND SURGERY  over 20 years ago   'metal plate in wrist'   IRRIGATION AND DEBRIDEMENT SHOULDER Left 04/03/2023   Procedure: IRRIGATION AND DEBRIDEMENT SHOULDER;  Surgeon: Dozier Soulier, MD;  Location: [facility];   Service: Orthopedics;  Laterality: Left;   JOINT REPLACEMENT N/A    [employer] 10/19/2019   MULTIPLE TOOTH EXTRACTIONS     POLYPECTOMY  05/09/2023   Procedure: POLYPECTOMY;  Surgeon: Eartha Angelia Sieving, MD;  Location: [facility];  Service: Gastroenterology;;   SHOULDER SURGERY Right 2021   ligament damage   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   Procedure: TOTAL KNEE ARTHROPLASTY;  Surgeon: Dempsey Sensor, MD;  Location[facility];  Service: Orthopedics;  Laterality: Left;   TOTAL KNEE ARTHROPLASTY Right 05/21/2021   Procedure: RIGHT TOTAL KNEE ARTHROPLASTY;  Surgeon: Sensor Dempsey, MD;  Location: [facility];  Service: Orthopedics;  Laterality: Right;   TOTAL SHOULDER REVISION Left 03/11/2023   Procedure: LEFT SHOULDER REVISION GLENOID COMPONENT;  Surgeon: Dozier Soulier, MD;  Location: [facility];  Service: Orthopedics;  Laterality: Left;   TOTAL SHOULDER REVISION Left 04/03/2023   Procedure: SHOULDER REVISION;  Surgeon: Dozier Soulier, MD;  Location: [facility];  Service: Orthopedics;  Laterality: Left;   Current Outpatient Medications on File Prior to Visit  Medication Sig Dispense Refill   ALPRAZolam  (XANAX ) 0.5 MG tablet Take 1 tablet (0.5 mg total) by mouth 3 (three) times daily as needed for anxiety. 30 tablet 2   amLODipine  (NORVASC ) 5 MG tablet TAKE 1 TABLET(5 MG) BY MOUTH DAILY 90 tablet 3   cefadroxil  (DURICEF) 500 MG capsule Take 2 capsules (1,000 mg total) by mouth 2 (two) times daily. 120 capsule 9   ELDERBERRY PO Take 1 tablet by mouth daily.  Garlic 1000 MG CAPS Take 1,000 mg by mouth daily.     hydrochlorothiazide  (HYDRODIURIL ) 25 MG tablet TAKE 1 TABLET(25 MG) BY MOUTH DAILY 90 tablet 1   Magnesium 250 MG TABS Take 250 mg by mouth daily.     metoprolol  succinate (TOPROL -XL) 100 MG 24 hr tablet TAKE 1 TABLET BY MOUTH EVERY DAY WITH OR IMMEDIATELY FOLLOWING A MEAL 90 tablet 1   Multiple Vitamin (MULTIVITAMIN) tablet Take 1 tablet by mouth daily. Adult 50+     Omega-3 Fatty  Acids (FISH OIL) 1000 MG CPDR Take 2,000 mg by mouth daily.     vitamin C (ASCORBIC ACID) 250 MG tablet Take 250 mg by mouth daily.     methocarbamol  (ROBAXIN ) 500 MG tablet Take 1 tablet (500 mg total) by mouth every 6 (six) hours as needed for muscle spasms. (Patient not taking: Reported on 11/11/2023) 20 tablet 0   ondansetron  (ZOFRAN ) 4 MG tablet Take 1 tablet (4 mg total) by mouth every 6 (six) hours as needed for nausea. (Patient not taking: Reported on 11/11/2023) 20 tablet 0   oxyCODONE  (OXY IR/ROXICODONE ) 5 MG immediate release tablet Take 1 tablet (5 mg total) by mouth every 6 (six) hours as needed for moderate pain (pain score 4-6). (Patient not taking: Reported on 11/11/2023) 20 tablet 0   Current Facility-Administered Medications on File Prior to Visit  Medication Dose Route Frequency Provider Last Rate Last Admin   0.9 %  sodium chloride  infusion  500 mL Intravenous Continuous Avram Lupita BRAVO, MD       .all Social History   Socioeconomic History   Marital status: Married    Spouse name: Not on file   Number of children: Not on file   Years of education: Not on file   Highest education level: Not on file  Occupational History   Not on file  Tobacco Use   Smoking status: Former    Current packs/day: 0.00    Types: Cigarettes    Quit date: 02/25/1997    Years since quitting: 26.9    Passive exposure: Current   Smokeless tobacco: Never   Tobacco comments:    quit 1999  Vaping Use   Vaping status: Never Used  Substance and Sexual Activity   Alcohol use: Yes    Alcohol/week: 6.0 standard drinks of alcohol    Types: 6 Cans of beer per week    Comment: occasional   Drug use: No   Sexual activity: Yes  Other Topics Concern   Not on file  Social History Narrative   Not on file   Social Drivers of Health   Financial Resource Strain: Low Risk  (12/05/2023)   Received from [facility]   Overall Financial Resource Strain (CARDIA)    How hard is it for you to pay for the  very basics like food, housing, medical care, and heating?: Not hard at all  Food Insecurity: No Food Insecurity (12/05/2023)   Received from [facility]   Hunger Vital Sign    Within the past 12 months, you worried that your food would run out before you got the money to buy more.: Never true    Within the past 12 months, the food you bought just didn't last and you didn't have money to get more.: Never true  Transportation Needs: No Transportation Needs (12/05/2023)   Received from [facility] - Transportation    In the past 12 months, has lack of transportation kept you from  medical appointments or from getting medications?: No    In the past 12 months, has lack of transportation kept you from meetings, work, or from getting things needed for daily living?: No  Physical Activity: Sufficiently Active (12/05/2023)   Received from [facility]   Exercise Vital Sign    On average, how many days per week do you engage in moderate to strenuous exercise (like a brisk walk)?: 3 days    On average, how many minutes do you engage in exercise at this level?: 60 min  Recent Concern: Physical Activity - Inactive (12/04/2023)   Exercise Vital Sign    Days of Exercise per Week: 0 days    Minutes of Exercise per Session: 0 min  Stress: No Stress Concern Present (12/05/2023)   Received from [facility] - Occupational Stress Questionnaire    Do you feel stress - tense, restless, nervous, or anxious, or unable to sleep at night because your mind is troubled all the time - these days?: Not at all  Social Connections: Socially Integrated (12/05/2023)   Received from [facility]   Social Network    How would you rate your social network (family, work, friends)?: Good participation with social networks  Intimate Partner Violence: Not At Risk (12/05/2023)   Received from [facility]   HITS    Over the last 12 months how often did your partner  physically hurt you?: Never    Over the last 12 months how often did your partner insult you or talk down to you?: Never    Over the last 12 months how often did your partner threaten you with physical harm?: Never    Over the last 12 months how often did your partner scream or curse at you?: Never   Family History  Problem Relation Age of Onset   Arthritis Mother    Heart disease Mother    Hypertension Mother    Hypertension Father    Stroke Father    Hypertension Sister    Stroke Brother    Colon cancer Paternal Uncle       Review of Systems  Musculoskeletal:  Positive for neck pain.  All other systems reviewed and are negative.      Objective:   Physical Exam Vitals reviewed.  Constitutional:      General: He is not in acute distress.    Appearance: Normal appearance. He is normal weight. He is not ill-appearing or toxic-appearing.  HENT:     Head: Normocephalic and atraumatic.  Cardiovascular:     Rate and Rhythm: Normal rate and regular rhythm.     Pulses: Normal pulses.     Heart sounds: Normal heart sounds. No murmur heard.    No friction rub. No gallop.  Pulmonary:     Effort: Pulmonary effort is normal. No respiratory distress.     Breath sounds: Normal breath sounds. No wheezing, rhonchi or rales.  Chest:  Breasts:    Breasts are symmetrical.     Right: Swelling present. No inverted nipple, mass or nipple discharge.     Left: Swelling present. No inverted nipple or mass.  Musculoskeletal:     Cervical back: Decreased range of motion.  Neurological:     General: No focal deficit present.     Mental Status: He is alert and oriented to person, place, and time. Mental status is at baseline.     Cranial Nerves: No cranial nerve deficit.  Motor: No weakness.     Gait: Gait normal.           Assessment & Plan:  Gynecomastia, male  Mastalgia Patient has gynecomastia and I suspect likely mastalgia secondary to hypogonadism and hormone changes  related to this.  I have offered the patient a mammogram to be 100% sure however I do not appreciate any definite cancerous mass today on exam.  There is no evidence of mastitis or infection

## 2024-02-13 ENCOUNTER — Other Ambulatory Visit: Payer: Self-pay

## 2024-02-13 ENCOUNTER — Ambulatory Visit: Payer: Self-pay

## 2024-02-13 ENCOUNTER — Ambulatory Visit
Admission: EM | Admit: 2024-02-13 | Discharge: 2024-02-13 | Disposition: A | Discharge status: Home / Self Care | Source: Home / Self Care

## 2024-02-13 DIAGNOSIS — R319 Hematuria, unspecified: Secondary | ICD-10-CM | POA: Insufficient documentation

## 2024-02-13 DIAGNOSIS — R051 Acute cough: Secondary | ICD-10-CM | POA: Insufficient documentation

## 2024-02-13 DIAGNOSIS — R1010 Upper abdominal pain, unspecified: Secondary | ICD-10-CM | POA: Insufficient documentation

## 2024-02-13 LAB — POC COVID19/FLU A&B COMBO
Covid Antigen, POC: NEGATIVE
Influenza A Antigen, POC: NEGATIVE
Influenza B Antigen, POC: NEGATIVE

## 2024-02-13 LAB — POCT URINE DIPSTICK
Glucose, UA: NEGATIVE mg/dL
Leukocytes, UA: NEGATIVE
Nitrite, UA: POSITIVE — AB
POC PROTEIN,UA: >=300 — AB
Spec Grav, UA: 1.025
Urobilinogen, UA: 1 U/dL
pH, UA: 6

## 2024-02-13 NOTE — ED Triage Notes (Addendum)
 Pt reports intermittent upper abdominal pain that radiating to back. Pt reports decreased appetite, nausea ever since. Denies any diarrhea. LBM Wednesday.

## 2024-02-13 NOTE — Telephone Encounter (Signed)
 FYI Only or Action Required?: FYI only for provider: UC advised, no appts available.  Patient was last seen in primary care on 01/27/2024 by [name], MD.  Called Nurse Triage reporting Vomiting, Fever, and Fatigue.  Symptoms began several days ago.  Interventions attempted: OTC medications: Acetaminophen  and Rest, hydration, or home remedies.  Symptoms are: unchanged.  Triage Disposition: See HCP Within 4 Hours (Or PCP Triage)  Patient/caregiver understands and will follow disposition?: Yes  Reason for Disposition  [1] Fever > 101 F (38.3 C) AND [2] age > 60 years  Answer Assessment - Initial Assessment Questions Spoke with patient and patient's wife, Nathan Stewart, who states that patient woke up Wednesday feeling like he had a sour stomach. He was vomiting that day with fever. He has continued to have fever up to 101F, but taking Acetaminophen  every 6 hours. He is still feeling nauseous, but states that he doesn't believe he has anything to vomit because he has been unable to eat. He feels fatigued with dizziness. He also mentions feeling a stabbing in the back on Wednesday. He has been able to drink liquids and denies any changes to urination frequency. UC advised.   1. NAUSEA SEVERITY: How bad is the nausea? (e.g., mild, moderate, severe; dehydration, weight loss)     Moderate  2. ONSET: When did the nausea begin?     Wednesday  3. VOMITING: Any vomiting? If Yes, ask: How many times today?     Yes on Wednesday, but nothing today because he hasn't been able to eat  4. RECURRENT SYMPTOM: Have you had nausea before? If Yes, ask: When was the last time? What happened that time?     Years ago, yes  5. CAUSE: What do you think is causing the nausea?     Unsure  6. PREGNANCY: Is there any chance you are pregnant? (e.g., unprotected intercourse, missed birth control pill, broken condom)     NA  Protocols used: Nausea-A-AH  Copied from CRM [id number]. Topic:  Clinical - Red Word Triage >> Feb 13, 2024  2:25 PM Nathan Stewart wrote: Red Word that prompted transfer to Nurse Triage: Patient's spouse, Nathan Stewart, calling to report Patient with fever, vomiting, unable to eat. Fatigue, weakness.

## 2024-02-13 NOTE — Discharge Instructions (Signed)
 Your vital signs and exam are reassuring today.  Your COVID and flu testing was negative.  Your urine testing showed signs of being under hydrated so make sure to be drinking plenty of fluids.  I have sent your urine test out for culture to make sure there is no bacterial growth that could be contributing to your symptoms.  We have obtained some labs for further evaluation of your symptoms.  It is imperative that you stay hydrated, eat bland foods as tolerated, and follow-up in the emergency department if worsening symptoms

## 2024-02-13 NOTE — ED Provider Notes (Addendum)
 " [facility]    CSN: [id number] Arrival date & time: 02/13/24  1527      History   Chief Complaint Chief Complaint  Patient presents with   Abdominal Pain    HPI Nathan Stewart is a 67 y.o. male.   Patient presenting today with initially significant epigastric and lower abdominal discomfort, nausea, dry heaving and now mild queasiness, decreased appetite and notes food taste like rubber bands.  He notes that his urine has been a bit dark but denies dysuria, hematuria, flank pain, fevers, chills.  Last bowel movement was on Wednesday, has been able to pass gas.  Noted to cough numerous times during exam, when asked though patient states he has not really had a cough.  So far not trying anything over-the-counter for symptoms.  Tolerating p.o. well.  No known sick contacts, new foods or medications recently.    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   GEN OSTEOARTHROSIS INVOLVING MULTIPLE SITES 11/10/2007   GERD 01/01/2007   Hx of adenomatous polyp of colon 04/04/2016   Hypertension    INSOMNIA, CHRONIC 11/13/2009   LIVER FUNCTION TESTS, ABNORMAL 08/27/2007   LOC OSTEOARTHROS NOT SPEC PRIM/SEC LOWER LEG 11/10/2007   Memory loss 08/27/2007   MENISCUS TEAR 10/30/2006   MSSA (methicillin susceptible Staphylococcus aureus) infection 04/29/2023   OSTEOARTHRITIS 06/07/2008   Pneumonia    Septic arthritis of shoulder, left (HCC) 09/16/2023   SLEEP APNEA 09/20/2009   no cpap   TESTICULAR HYPOFUNCTION 08/14/2009    Patient Active Problem List   Diagnosis Date Noted   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, 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   Septic arthritis of shoulder, left (HCC) 09/16/2023   MSSA (methicillin susceptible Staphylococcus aureus) infection 04/29/2023   Infection of prosthetic  total shoulder joint, initial encounter 04/03/2023   History of revision of total shoulder arthroplasty 03/13/2023   Metabolic dysfunction-associated steatohepatitis (MASH) 12/24/2022   Arthropathy of cervical facet joint 09/11/2022   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   DDD (degenerative disc disease), cervical 09/11/2022   Viral URI 08/28/2022   Neck pain on left side 03/12/2022   Osteoarthritis of right knee 05/18/2021   Closed fracture of one rib 04/08/2017   Contusion of chest 04/06/2017   History of colonic polyps 04/04/2016   Cataract of both eyes 09/28/2015   Retinopathy, hypertensive, both eyes 09/28/2015   Glaucoma of both eyes 09/28/2015   Primary osteoarthritis of left knee 11/27/2014   INSOMNIA, CHRONIC 11/13/2009   Sleep apnea 09/20/2009   Low testosterone  08/14/2009   Osteoarthritis 06/07/2008   GEN OSTEOARTHROSIS INVOLVING MULTIPLE SITES 11/10/2007   Elevated LFTs 08/27/2007   GERD 01/01/2007   Essential hypertension 01/01/2007    Past Surgical History:  Procedure Laterality Date   COLONOSCOPY     COLONOSCOPY WITH PROPOFOL  N/A 05/09/2023   Procedure: COLONOSCOPY WITH PROPOFOL ;  Surgeon: Eartha Angelia Sieving, MD;  Location: [facility];  Service: Gastroenterology;  Laterality: N/A;  10:45AM;ASA 1   HAND SURGERY  over 20 years ago   'metal plate in wrist'   IRRIGATION AND DEBRIDEMENT SHOULDER Left 04/03/2023   Procedure: IRRIGATION AND DEBRIDEMENT SHOULDER;  Surgeon: Dozier Soulier, MD;  Location: [facility];  Service: Orthopedics;  Laterality: Left;   JOINT REPLACEMENT N/A    [employer] 10/19/2019   MULTIPLE TOOTH EXTRACTIONS  POLYPECTOMY  05/09/2023   Procedure: POLYPECTOMY;  Surgeon: Eartha Angelia Sieving, MD;  Location: [facility];  Service: Gastroenterology;;   SHOULDER SURGERY Right 2021   ligament damage   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   Procedure: TOTAL KNEE ARTHROPLASTY;  Surgeon: Dempsey Sensor, MD;  Location:  [facility];  Service: Orthopedics;  Laterality: Left;   TOTAL KNEE ARTHROPLASTY Right 05/21/2021   Procedure: RIGHT TOTAL KNEE ARTHROPLASTY;  Surgeon: Sensor Dempsey, MD;  Location: [facility];  Service: Orthopedics;  Laterality: Right;   TOTAL SHOULDER REVISION Left 03/11/2023   Procedure: LEFT SHOULDER REVISION GLENOID COMPONENT;  Surgeon: Dozier Soulier, MD;  Location: [facility];  Service: Orthopedics;  Laterality: Left;   TOTAL SHOULDER REVISION Left 04/03/2023   Procedure: SHOULDER REVISION;  Surgeon: Dozier Soulier, MD;  Location: [facility];  Service: Orthopedics;  Laterality: Left;       Home Medications    Prior to Admission medications  Medication Sig Start Date End Date Taking? Authorizing Provider  ALPRAZolam  (XANAX ) 0.5 MG tablet Take 1 tablet (0.5 mg total) by mouth 3 (three) times daily as needed for anxiety. 11/11/23   [name], MD  amLODipine  (NORVASC ) 5 MG tablet TAKE 1 TABLET(5 MG) BY MOUTH DAILY 12/15/23   [name], MD  cefadroxil  (DURICEF) 500 MG capsule Take 2 capsules (1,000 mg total) by mouth 2 (two) times daily. 04/30/23   Fleeta Kathie Jomarie LOISE, MD  ELDERBERRY PO Take 1 tablet by mouth daily.    [provider]  Garlic 1000 MG CAPS Take 1,000 mg by mouth daily.    [provider]  hydrochlorothiazide  (HYDRODIURIL ) 25 MG tablet TAKE 1 TABLET(25 MG) BY MOUTH DAILY 09/18/23   [name], MD  Magnesium 250 MG TABS Take 250 mg by mouth daily.    [provider]  methocarbamol  (ROBAXIN ) 500 MG tablet Take 1 tablet (500 mg total) by mouth every 6 (six) hours as needed for muscle spasms. Patient not taking: Reported on 11/11/2023 04/04/23   [place], [facility], PA-C  metoprolol  succinate (TOPROL -XL) 100 MG 24 hr tablet TAKE 1 TABLET BY MOUTH EVERY DAY WITH OR IMMEDIATELY FOLLOWING A MEAL 12/16/23   [name], MD  Multiple Vitamin (MULTIVITAMIN) tablet Take 1 tablet by mouth daily. Adult 50+    [provider]  Omega-3 Fatty Acids  (FISH OIL) 1000 MG CPDR Take 2,000 mg by mouth daily.    [provider]  ondansetron  (ZOFRAN ) 4 MG tablet Take 1 tablet (4 mg total) by mouth every 6 (six) hours as needed for nausea. Patient not taking: Reported on 11/11/2023 04/04/23   [place], [facility], PA-C  oxyCODONE  (OXY IR/ROXICODONE ) 5 MG immediate release tablet Take 1 tablet (5 mg total) by mouth every 6 (six) hours as needed for moderate pain (pain score 4-6). Patient not taking: Reported on 11/11/2023 04/04/23   [place], [facility], PA-C  vitamin C (ASCORBIC ACID) 250 MG tablet Take 250 mg by mouth daily.    [provider]    Family History Family History  Problem Relation Age of Onset   Arthritis Mother    Heart disease Mother    Hypertension Mother    Hypertension Father    Stroke Father    Hypertension Sister    Stroke Brother    Colon cancer Paternal Uncle     Social History Social History[1]   Allergies   Hydrocodone -acetaminophen , Irbesartan , Percocet [oxycodone -acetaminophen ], and Shingrix [zoster vac recomb adjuvanted]   Review of Systems Review of  Systems Per HPI  Physical Exam Triage Vital Signs ED Triage Vitals  Encounter Vitals Group     BP 02/13/24 1625 129/72     Girls Systolic BP Percentile --      Girls Diastolic BP Percentile --      Boys Systolic BP Percentile --      Boys Diastolic BP Percentile --      Pulse Rate 02/13/24 1625 96     Resp 02/13/24 1625 18     Temp 02/13/24 1625 99.5 F (37.5 C)     Temp src --      SpO2 02/13/24 1625 93 %     Weight --      Height --      Head Circumference --      Peak Flow --      Pain Score 02/13/24 1632 2     Pain Loc --      Pain Education --      Exclude from Growth Chart --    No data found.  Updated Vital Signs BP 129/72 (BP Location: Right Arm)   Pulse 96   Temp 99.5 F (37.5 C)   Resp 18   SpO2 93%   Visual Acuity Right Eye Distance:   Left Eye Distance:   Bilateral Distance:    Right Eye Near:   Left Eye  Near:    Bilateral Near:     Physical Exam Vitals and nursing note reviewed.  Constitutional:      Appearance: Normal appearance.  HENT:     Head: Atraumatic.     Mouth/Throat:     Mouth: Mucous membranes are moist.     Pharynx: Oropharynx is clear.  Eyes:     Extraocular Movements: Extraocular movements intact.     Conjunctiva/sclera: Conjunctivae normal.  Cardiovascular:     Rate and Rhythm: Normal rate.  Pulmonary:     Effort: Pulmonary effort is normal.     Breath sounds: Normal breath sounds. No wheezing or rales.  Abdominal:     General: Bowel sounds are normal. There is no distension.     Palpations: Abdomen is soft.     Tenderness: There is no abdominal tenderness. There is no right CVA tenderness, left CVA tenderness or guarding.  Musculoskeletal:        General: Normal range of motion.     Cervical back: Normal range of motion and neck supple.  Skin:    General: Skin is warm and dry.  Neurological:     Mental Status: He is oriented to person, place, and time.  Psychiatric:        Mood and Affect: Mood normal.        Thought Content: Thought content normal.        Judgment: Judgment normal.      UC Treatments / Results  Labs (all labs ordered are listed, but only abnormal results are displayed) Labs Reviewed  POCT URINE DIPSTICK - Abnormal; Notable for the following components:      Result Value   Color, UA orange (*)    Clarity, UA cloudy (*)    Bilirubin, UA small (*)    Ketones, POC UA moderate (40) (*)    Blood, UA large (*)    POC PROTEIN,UA >=300 (*)    Nitrite, UA Positive (*)    All other components within normal limits  POC COVID19/FLU A&B COMBO - Normal  URINE CULTURE  LIPASE  COMPREHENSIVE METABOLIC PANEL WITH GFR  CBC  WITH DIFFERENTIAL/PLATELET    EKG   Radiology No results found.  Procedures Procedures (including critical care time)  Medications Ordered in UC Medications - No data to display  Initial Impression / Assessment  and Plan / UC Course  I have reviewed the triage vital signs and the nursing notes.  Pertinent labs & imaging results that were available during my care of the patient were reviewed by me and considered in my medical decision making (see chart for details).     Vital signs and exam benign and reassuring today, suspicious for viral illness.  Do have some concern regarding dehydration or other more internal issues given the dark brown and orange tent of his urine and the urinalysis findings today though unclear how much of the abnormalities are coming from the color change.  Urine culture pending, labs including renal function pending, flu and COVID testing negative.  Discussed importance of good hydration even if not feeling up to eating, rest, supportive over-the-counter medications and home care.  ED for significantly worsening symptoms.  45 minutes spent today in direct patient care, evaluation, education  Final Clinical Impressions(s) / UC Diagnoses   Final diagnoses:  Upper abdominal pain  Acute cough  Hematuria, unspecified type     Discharge Instructions      Your vital signs and exam are reassuring today.  Your COVID and flu testing was negative.  Your urine testing showed signs of being under hydrated so make sure to be drinking plenty of fluids.  I have sent your urine test out for culture to make sure there is no bacterial growth that could be contributing to your symptoms.  We have obtained some labs for further evaluation of your symptoms.  It is imperative that you stay hydrated, eat bland foods as tolerated, and follow-up in the emergency department if worsening symptoms    ED Prescriptions   None    PDMP not reviewed this encounter.    Stuart Vernell Norris, [place] 02/13/24 1841     [1]  Social History Tobacco Use   Smoking status: Former    Current packs/day: 0.00    Types: Cigarettes    Quit date: 02/25/1997    Years since quitting: 26.9    Passive  exposure: Current   Smokeless tobacco: Never   Tobacco comments:    quit 1999  Vaping Use   Vaping status: Never Used  Substance Use Topics   Alcohol use: Yes    Alcohol/week: 6.0 standard drinks of alcohol    Types: 6 Cans of beer per week    Comment: occasional   Drug use: No     Stuart Vernell Norris, PA-C 02/13/24 1841  "

## 2024-02-14 LAB — CBC WITH DIFFERENTIAL/PLATELET
Basophils Absolute: 0.1 x10E3/uL (ref 0.0–0.2)
Basos: 0 %
EOS (ABSOLUTE): 0 x10E3/uL (ref 0.0–0.4)
Eos: 0 %
Hematocrit: 42.8 % (ref 37.5–51.0)
Hemoglobin: 14.8 g/dL (ref 13.0–17.7)
Immature Grans (Abs): 0.2 x10E3/uL — ABNORMAL HIGH (ref 0.0–0.1)
Immature Granulocytes: 1 %
Lymphocytes Absolute: 1.3 x10E3/uL (ref 0.7–3.1)
Lymphs: 5 %
MCH: 30.4 pg (ref 26.6–33.0)
MCHC: 34.6 g/dL (ref 31.5–35.7)
MCV: 88 fL (ref 79–97)
Monocytes Absolute: 1.4 x10E3/uL — ABNORMAL HIGH (ref 0.1–0.9)
Monocytes: 6 %
Neutrophils Absolute: 21.5 x10E3/uL — ABNORMAL HIGH (ref 1.4–7.0)
Neutrophils: 88 %
Platelets: 234 x10E3/uL (ref 150–450)
RBC: 4.87 x10E6/uL (ref 4.14–5.80)
RDW: 12.5 % (ref 11.6–15.4)
WBC: 24.4 x10E3/uL (ref 3.4–10.8)

## 2024-02-14 LAB — COMPREHENSIVE METABOLIC PANEL WITH GFR
ALT: 29 IU/L (ref 0–44)
AST: 20 IU/L (ref 0–40)
Albumin: 4.7 g/dL (ref 3.9–4.9)
Alkaline Phosphatase: 100 IU/L (ref 47–123)
BUN/Creatinine Ratio: 14 (ref 10–24)
BUN: 14 mg/dL (ref 8–27)
Bilirubin Total: 1.4 mg/dL — ABNORMAL HIGH (ref 0.0–1.2)
CO2: 20 mmol/L (ref 20–29)
Calcium: 9.8 mg/dL (ref 8.6–10.2)
Chloride: 89 mmol/L — ABNORMAL LOW (ref 96–106)
Creatinine, Ser: 1 mg/dL (ref 0.76–1.27)
Globulin, Total: 2.7 g/dL (ref 1.5–4.5)
Glucose: 120 mg/dL — ABNORMAL HIGH (ref 70–99)
Potassium: 3.3 mmol/L — ABNORMAL LOW (ref 3.5–5.2)
Sodium: 130 mmol/L — ABNORMAL LOW (ref 134–144)
Total Protein: 7.4 g/dL (ref 6.0–8.5)
eGFR: 82 mL/min/1.73

## 2024-02-14 LAB — LIPASE: Lipase: 16 U/L (ref 13–78)

## 2024-02-15 LAB — URINE CULTURE: Culture: NO GROWTH

## 2024-02-16 ENCOUNTER — Ambulatory Visit (HOSPITAL_COMMUNITY): Payer: Self-pay

## 2024-02-16 ENCOUNTER — Emergency Department (HOSPITAL_BASED_OUTPATIENT_CLINIC_OR_DEPARTMENT_OTHER)

## 2024-02-16 ENCOUNTER — Other Ambulatory Visit: Payer: Self-pay

## 2024-02-16 ENCOUNTER — Inpatient Hospital Stay (HOSPITAL_BASED_OUTPATIENT_CLINIC_OR_DEPARTMENT_OTHER)
Admission: EM | Admit: 2024-02-16 | Discharge: 2024-02-27 | DRG: 417 | Disposition: A | Discharge status: Home / Self Care | Source: Ambulatory Visit | Attending: Internal Medicine | Admitting: Internal Medicine

## 2024-02-16 ENCOUNTER — Telehealth: Payer: Self-pay

## 2024-02-16 ENCOUNTER — Encounter (HOSPITAL_BASED_OUTPATIENT_CLINIC_OR_DEPARTMENT_OTHER): Payer: Self-pay

## 2024-02-16 DIAGNOSIS — M00012 Staphylococcal arthritis, left shoulder: Secondary | ICD-10-CM | POA: Diagnosis present

## 2024-02-16 DIAGNOSIS — K219 Gastro-esophageal reflux disease without esophagitis: Secondary | ICD-10-CM | POA: Diagnosis present

## 2024-02-16 DIAGNOSIS — K651 Peritoneal abscess: Secondary | ICD-10-CM | POA: Diagnosis not present

## 2024-02-16 DIAGNOSIS — J9601 Acute respiratory failure with hypoxia: Secondary | ICD-10-CM | POA: Diagnosis not present

## 2024-02-16 DIAGNOSIS — K8309 Other cholangitis: Secondary | ICD-10-CM

## 2024-02-16 DIAGNOSIS — K8050 Calculus of bile duct without cholangitis or cholecystitis without obstruction: Secondary | ICD-10-CM | POA: Diagnosis not present

## 2024-02-16 DIAGNOSIS — K819 Cholecystitis, unspecified: Secondary | ICD-10-CM | POA: Diagnosis not present

## 2024-02-16 DIAGNOSIS — Z1152 Encounter for screening for COVID-19: Secondary | ICD-10-CM | POA: Diagnosis not present

## 2024-02-16 DIAGNOSIS — B9561 Methicillin susceptible Staphylococcus aureus infection as the cause of diseases classified elsewhere: Secondary | ICD-10-CM | POA: Diagnosis present

## 2024-02-16 DIAGNOSIS — Y831 Surgical operation with implant of artificial internal device as the cause of abnormal reaction of the patient, or of later complication, without mention of misadventure at the time of the procedure: Secondary | ICD-10-CM | POA: Diagnosis present

## 2024-02-16 DIAGNOSIS — Z888 Allergy status to other drugs, medicaments and biological substances status: Secondary | ICD-10-CM

## 2024-02-16 DIAGNOSIS — Z887 Allergy status to serum and vaccine status: Secondary | ICD-10-CM

## 2024-02-16 DIAGNOSIS — K8062 Calculus of gallbladder and bile duct with acute cholecystitis without obstruction: Principal | ICD-10-CM | POA: Diagnosis present

## 2024-02-16 DIAGNOSIS — Z91199 Patient's noncompliance with other medical treatment and regimen due to unspecified reason: Secondary | ICD-10-CM

## 2024-02-16 DIAGNOSIS — I10 Essential (primary) hypertension: Secondary | ICD-10-CM | POA: Diagnosis present

## 2024-02-16 DIAGNOSIS — Z800 Family history of malignant neoplasm of digestive organs: Secondary | ICD-10-CM

## 2024-02-16 DIAGNOSIS — K839 Disease of biliary tract, unspecified: Secondary | ICD-10-CM

## 2024-02-16 DIAGNOSIS — Z8261 Family history of arthritis: Secondary | ICD-10-CM

## 2024-02-16 DIAGNOSIS — E876 Hypokalemia: Secondary | ICD-10-CM | POA: Diagnosis present

## 2024-02-16 DIAGNOSIS — Z792 Long term (current) use of antibiotics: Secondary | ICD-10-CM

## 2024-02-16 DIAGNOSIS — K9186 Retained cholelithiasis following cholecystectomy: Secondary | ICD-10-CM | POA: Diagnosis not present

## 2024-02-16 DIAGNOSIS — D75839 Thrombocytosis, unspecified: Secondary | ICD-10-CM | POA: Diagnosis present

## 2024-02-16 DIAGNOSIS — K8000 Calculus of gallbladder with acute cholecystitis without obstruction: Secondary | ICD-10-CM | POA: Diagnosis not present

## 2024-02-16 DIAGNOSIS — Z8249 Family history of ischemic heart disease and other diseases of the circulatory system: Secondary | ICD-10-CM

## 2024-02-16 DIAGNOSIS — K567 Ileus, unspecified: Secondary | ICD-10-CM | POA: Diagnosis not present

## 2024-02-16 DIAGNOSIS — R6520 Severe sepsis without septic shock: Secondary | ICD-10-CM | POA: Diagnosis not present

## 2024-02-16 DIAGNOSIS — Z885 Allergy status to narcotic agent status: Secondary | ICD-10-CM

## 2024-02-16 DIAGNOSIS — Z79899 Other long term (current) drug therapy: Secondary | ICD-10-CM | POA: Diagnosis not present

## 2024-02-16 DIAGNOSIS — J9811 Atelectasis: Secondary | ICD-10-CM | POA: Diagnosis present

## 2024-02-16 DIAGNOSIS — Z87891 Personal history of nicotine dependence: Secondary | ICD-10-CM

## 2024-02-16 DIAGNOSIS — Z96612 Presence of left artificial shoulder joint: Secondary | ICD-10-CM

## 2024-02-16 DIAGNOSIS — G4733 Obstructive sleep apnea (adult) (pediatric): Secondary | ICD-10-CM | POA: Diagnosis present

## 2024-02-16 DIAGNOSIS — G4730 Sleep apnea, unspecified: Secondary | ICD-10-CM | POA: Diagnosis not present

## 2024-02-16 DIAGNOSIS — A419 Sepsis, unspecified organism: Secondary | ICD-10-CM | POA: Diagnosis not present

## 2024-02-16 DIAGNOSIS — K449 Diaphragmatic hernia without obstruction or gangrene: Secondary | ICD-10-CM | POA: Diagnosis present

## 2024-02-16 DIAGNOSIS — K82A1 Gangrene of gallbladder in cholecystitis: Secondary | ICD-10-CM | POA: Diagnosis present

## 2024-02-16 DIAGNOSIS — K810 Acute cholecystitis: Secondary | ICD-10-CM | POA: Diagnosis present

## 2024-02-16 DIAGNOSIS — Z96653 Presence of artificial knee joint, bilateral: Secondary | ICD-10-CM | POA: Diagnosis present

## 2024-02-16 DIAGNOSIS — K838 Other specified diseases of biliary tract: Secondary | ICD-10-CM | POA: Diagnosis not present

## 2024-02-16 DIAGNOSIS — R188 Other ascites: Secondary | ICD-10-CM

## 2024-02-16 DIAGNOSIS — E871 Hypo-osmolality and hyponatremia: Secondary | ICD-10-CM | POA: Diagnosis present

## 2024-02-16 DIAGNOSIS — T849XXD Unspecified complication of internal orthopedic prosthetic device, implant and graft, subsequent encounter: Secondary | ICD-10-CM | POA: Diagnosis not present

## 2024-02-16 DIAGNOSIS — Y838 Other surgical procedures as the cause of abnormal reaction of the patient, or of later complication, without mention of misadventure at the time of the procedure: Secondary | ICD-10-CM | POA: Diagnosis not present

## 2024-02-16 DIAGNOSIS — T847XXD Infection and inflammatory reaction due to other internal orthopedic prosthetic devices, implants and grafts, subsequent encounter: Secondary | ICD-10-CM

## 2024-02-16 DIAGNOSIS — K9189 Other postprocedural complications and disorders of digestive system: Secondary | ICD-10-CM | POA: Diagnosis not present

## 2024-02-16 DIAGNOSIS — E860 Dehydration: Secondary | ICD-10-CM | POA: Diagnosis present

## 2024-02-16 DIAGNOSIS — Z823 Family history of stroke: Secondary | ICD-10-CM

## 2024-02-16 DIAGNOSIS — Z860101 Personal history of adenomatous and serrated colon polyps: Secondary | ICD-10-CM

## 2024-02-16 LAB — URINALYSIS, ROUTINE W REFLEX MICROSCOPIC
Bacteria, UA: NONE SEEN
Bilirubin Urine: NEGATIVE
Glucose, UA: NEGATIVE mg/dL
Ketones, ur: NEGATIVE mg/dL
Leukocytes,Ua: NEGATIVE
Nitrite: NEGATIVE
Protein, ur: NEGATIVE mg/dL
Specific Gravity, Urine: 1.007 (ref 1.005–1.030)
pH: 7 (ref 5.0–8.0)

## 2024-02-16 LAB — COMPREHENSIVE METABOLIC PANEL WITH GFR
ALT: 34 U/L (ref 0–44)
AST: 29 U/L (ref 15–41)
Albumin: 4.1 g/dL (ref 3.5–5.0)
Alkaline Phosphatase: 122 U/L (ref 38–126)
Anion gap: 15 (ref 5–15)
BUN: 12 mg/dL (ref 8–23)
CO2: 26 mmol/L (ref 22–32)
Calcium: 10.8 mg/dL — ABNORMAL HIGH (ref 8.9–10.3)
Chloride: 87 mmol/L — ABNORMAL LOW (ref 98–111)
Creatinine, Ser: 0.88 mg/dL (ref 0.61–1.24)
GFR, Estimated: >60 mL/min
Glucose, Bld: 112 mg/dL — ABNORMAL HIGH (ref 70–99)
Potassium: 3 mmol/L — ABNORMAL LOW (ref 3.5–5.1)
Sodium: 129 mmol/L — ABNORMAL LOW (ref 135–145)
Total Bilirubin: 1.1 mg/dL (ref 0.0–1.2)
Total Protein: 8 g/dL (ref 6.5–8.1)

## 2024-02-16 LAB — RESP PANEL BY RT-PCR (RSV, FLU A&B, COVID)  RVPGX2
Influenza A by PCR: NEGATIVE
Influenza B by PCR: NEGATIVE
Resp Syncytial Virus by PCR: NEGATIVE
SARS Coronavirus 2 by RT PCR: NEGATIVE

## 2024-02-16 LAB — CBC
HCT: 38.6 % — ABNORMAL LOW (ref 39.0–52.0)
Hemoglobin: 13.5 g/dL (ref 13.0–17.0)
MCH: 29.7 pg (ref 26.0–34.0)
MCHC: 35 g/dL (ref 30.0–36.0)
MCV: 85 fL (ref 80.0–100.0)
Platelets: 273 K/uL (ref 150–400)
RBC: 4.54 MIL/uL (ref 4.22–5.81)
RDW: 11.9 % (ref 11.5–15.5)
WBC: 18 K/uL — ABNORMAL HIGH (ref 4.0–10.5)
nRBC: 0 % (ref 0.0–0.2)

## 2024-02-16 LAB — LIPASE, BLOOD: Lipase: 30 U/L (ref 11–51)

## 2024-02-16 LAB — LACTIC ACID, PLASMA: Lactic Acid, Venous: 0.9 mmol/L (ref 0.5–1.9)

## 2024-02-16 LAB — TROPONIN T, HIGH SENSITIVITY: Troponin T High Sensitivity: <15 ng/L (ref 0–19)

## 2024-02-16 MED ORDER — IOHEXOL 350 MG/ML SOLN
100.0000 mL | Freq: Once | INTRAVENOUS | Status: AC | PRN
  Administered 2024-02-16: 100 mL via INTRAVENOUS

## 2024-02-16 MED ORDER — PIPERACILLIN-TAZOBACTAM 3.375 G IVPB 30 MIN
3.3750 g | Freq: Once | INTRAVENOUS | Status: AC
  Administered 2024-02-16: 3.375 g via INTRAVENOUS
  Filled 2024-02-16: qty 50

## 2024-02-16 MED ORDER — SODIUM CHLORIDE 0.9 % IV BOLUS
1000.0000 mL | Freq: Once | INTRAVENOUS | Status: AC
  Administered 2024-02-16: 1000 mL via INTRAVENOUS

## 2024-02-16 MED ORDER — POTASSIUM CHLORIDE 10 MEQ/100ML IV SOLN
10.0000 meq | INTRAVENOUS | Status: AC
  Administered 2024-02-16 (×2): 10 meq via INTRAVENOUS
  Filled 2024-02-16 (×2): qty 100

## 2024-02-16 NOTE — Telephone Encounter (Signed)
 Copied from CRM #[id number]. Topic: Clinical - Lab/Test Results >> Feb 16, 2024 10:24 AM Macario HERO wrote: Reason for CRM: Patient spouse called said they went to urgent care Friday and patient is still not doing well, feeling weak and no appetite. Declined nurse triage and requesting provider to review labs and follow up with her.   [phone]

## 2024-02-16 NOTE — ED Provider Notes (Signed)
 " [place] EMERGENCY DEPARTMENT AT [facility] Provider Note   CSN: [id number] Arrival date & time: 02/16/24  1547    Patient presents with: abd pan, abnormal lab   Nathan Stewart is a 68 y.o. 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  Seen by urgent care 3 days ago.  Obtain labs.  Thought he likely had viral infection at that time.  He returns today due to intermittent fevers at home as well as upper abdominal pain, decreased appetite, nausea.  He is on Duricef chronically for left septic joint.  He denies any pain, redness or swelling to his shoulder and actually states his shoulder has been doing great.   HPI     Prior to Admission medications  Medication Sig Start Date End Date Taking? Authorizing Provider  ALPRAZolam  (XANAX ) 0.5 MG tablet Take 1 tablet (0.5 mg total) by mouth 3 (three) times daily as needed for anxiety. 11/11/23   [name], MD  amLODipine  (NORVASC ) 5 MG tablet TAKE 1 TABLET(5 MG) BY MOUTH DAILY 12/15/23   [name], MD  cefadroxil  (DURICEF) 500 MG capsule Take 2 capsules (1,000 mg total) by mouth 2 (two) times daily. 04/30/23   Fleeta Kathie Jomarie LOISE, MD  ELDERBERRY PO Take 1 tablet by mouth daily.    [provider]  Garlic 1000 MG CAPS Take 1,000 mg by mouth daily.    [provider]  hydrochlorothiazide  (HYDRODIURIL ) 25 MG tablet TAKE 1 TABLET(25 MG) BY MOUTH DAILY 09/18/23   [name], MD  Magnesium 250 MG TABS Take 250 mg by mouth daily.    [provider]  metoprolol  succinate (TOPROL -XL) 100 MG 24 hr tablet TAKE 1 TABLET BY MOUTH EVERY DAY WITH OR IMMEDIATELY FOLLOWING A MEAL 12/16/23   [name], MD  Multiple Vitamin (MULTIVITAMIN) tablet Take 1 tablet by mouth daily. Adult 50+    [provider]  Omega-3 Fatty Acids (FISH OIL) 1000 MG CPDR Take 2,000 mg by mouth daily.     [provider]  vitamin C (ASCORBIC ACID) 250 MG tablet Take 250 mg by mouth daily.    [provider]    Allergies: Hydrocodone -acetaminophen , Irbesartan , Percocet [oxycodone -acetaminophen ], and Shingrix [zoster vac recomb adjuvanted]    Review of Systems  Constitutional:  Positive for fatigue and fever.  HENT: Negative.    Respiratory:  Positive for cough (chronic).   Cardiovascular: Negative.   Gastrointestinal:  Positive for abdominal pain, constipation and nausea. Negative for abdominal distention, anal bleeding, blood in stool, diarrhea and vomiting.  Genitourinary: Negative.   Musculoskeletal: Negative.   Skin: Negative.   Neurological:  Positive for weakness (generalized). Negative for dizziness, tremors, syncope, speech difficulty, light-headedness, numbness and headaches.  All other systems reviewed and are negative.   Updated Vital Signs BP (!) 142/79 (BP Location: Left Arm)   Pulse 89   Temp 99.7 F (37.6 C) (Oral)   Resp (!) 22   SpO2 97%   Physical Exam Vitals and nursing note reviewed.  Constitutional:      General: He is not in acute distress.    Appearance: He is well-developed. He is not ill-appearing, toxic-appearing or diaphoretic.  HENT:     Head: Normocephalic and atraumatic.     Nose: Nose normal.     Mouth/Throat:     Mouth: Mucous membranes are dry.  Eyes:  Pupils: Pupils are equal, round, and reactive to light.  Cardiovascular:     Rate and Rhythm: Normal rate and regular rhythm.     Pulses: Normal pulses.     Heart sounds: Normal heart sounds.  Pulmonary:     Effort: Pulmonary effort is normal. No respiratory distress.     Breath sounds: Normal breath sounds.  Abdominal:     General: Bowel sounds are normal. There is no distension.     Palpations: Abdomen is soft.     Tenderness: There is abdominal tenderness. There is no right CVA tenderness, left CVA tenderness, guarding or rebound.  Musculoskeletal:         General: Normal range of motion.     Cervical back: Normal range of motion and neck supple.     Comments: Old scar anterior left shoulder, no surrounding erythema or warmth  Skin:    General: Skin is warm and dry.  Neurological:     General: No focal deficit present.     Mental Status: He is alert and oriented to person, place, and time.     (all labs ordered are listed, but only abnormal results are displayed) Labs Reviewed  COMPREHENSIVE METABOLIC PANEL WITH GFR - Abnormal; Notable for the following components:      Result Value   Sodium 129 (*)    Potassium 3.0 (*)    Chloride 87 (*)    Glucose, Bld 112 (*)    Calcium 10.8 (*)    All other components within normal limits  CBC - Abnormal; Notable for the following components:   WBC 18.0 (*)    HCT 38.6 (*)    All other components within normal limits  URINALYSIS, ROUTINE W REFLEX MICROSCOPIC - Abnormal; Notable for the following components:   Hgb urine dipstick SMALL (*)    All other components within normal limits  RESP PANEL BY RT-PCR (RSV, FLU A&B, COVID)  RVPGX2  CULTURE, BLOOD (ROUTINE X 2)  CULTURE, BLOOD (ROUTINE X 2)  LIPASE, BLOOD  LACTIC ACID, PLASMA  LACTIC ACID, PLASMA  TROPONIN T, HIGH SENSITIVITY    EKG: EKG Interpretation Date/Time:  Monday February 16 2024 16:01:57 EST Ventricular Rate:  81 PR Interval:  184 QRS Duration:  90 QT Interval:  392 QTC Calculation: 455 R Axis:   47  Text Interpretation: Normal sinus rhythm subtle anterior ST coving compared to  first previous Confirmed by Armenta Canning [phone]) on 02/16/2024 5:05:56 PM  Radiology: CT ABDOMEN PELVIS W CONTRAST Result Date: 02/16/2024 EXAM: CT ABDOMEN AND PELVIS WITH CONTRAST 02/16/2024 07:57:25 PM TECHNIQUE: CT of the abdomen and pelvis was performed with the administration of 100 mL of iohexol  (OMNIPAQUE ) 350 MG/ML injection. Multiplanar reformatted images are provided for review. Automated exposure control, iterative reconstruction,  and/or weight-based adjustment of the mA/kV was utilized to reduce the radiation dose to as low as reasonably achievable. COMPARISON: None available. CLINICAL HISTORY: Epigastric pain; cp, epigastric abd pain, leukocytosis. FINDINGS: LOWER CHEST: No acute abnormality. LIVER: Hepatic steatosis. GALLBLADDER AND BILE DUCTS: Distended gallbladder with wall thickening and pericholecystic fluid compatible with acute cholecystitis. No radiopaque stone or biliary ductal dilatation. SPLEEN: No acute abnormality. PANCREAS: No acute abnormality. ADRENAL GLANDS: No acute abnormality. KIDNEYS, URETERS AND BLADDER: No stones in the kidneys or ureters. No hydronephrosis. No perinephric or periureteral stranding. Urinary bladder is unremarkable. GI AND BOWEL: Stomach demonstrates no acute abnormality. Colonic diverticulosis without evidence of diverticulitis. There is no bowel obstruction. PERITONEUM AND RETROPERITONEUM: No ascites. No free  air. VASCULATURE: Aorta is normal in caliber. Aortic atherosclerotic calcification. LYMPH NODES: No lymphadenopathy. REPRODUCTIVE ORGANS: No acute abnormality. BONES AND SOFT TISSUES: No acute osseous abnormality. No focal soft tissue abnormality. IMPRESSION: 1. Acute cholecystitis. Electronically signed by: Norman Gatlin MD 02/16/2024 08:10 PM EST RP Workstation: [id number]   CT Angio Chest PE W and/or Wo Contrast Result Date: 02/16/2024 EXAM: CTA CHEST 02/16/2024 07:57:25 PM TECHNIQUE: CTA of the chest was performed with the administration of 100 mL of iohexol  (OMNIPAQUE ) 350 MG/ML injection. Multiplanar reformatted images are provided for review. MIP images are provided for review. Automated exposure control, iterative reconstruction, and/or weight based adjustment of the mA/kV was utilized to reduce the radiation dose to as low as reasonably achievable. COMPARISON: Comparison with the same day x-ray. CLINICAL HISTORY: Pulmonary embolism (PE) suspected, high prob; cough, cp, abd pain.  FINDINGS: PULMONARY ARTERIES: Pulmonary arteries are adequately opacified for evaluation. No acute pulmonary embolus. Main pulmonary artery is normal in caliber. MEDIASTINUM: Coronary artery and aortic atherosclerotic calcification. The heart and pericardium demonstrate no acute abnormality. There is no acute abnormality of the thoracic aorta. LYMPH NODES: No mediastinal, hilar or axillary lymphadenopathy. LUNGS AND PLEURA: Emphysema. Right lower lobe atelectasis. No evidence of pleural effusion or pneumothorax. UPPER ABDOMEN: Hepatic steatosis. SOFT TISSUES AND BONES: No acute bone or soft tissue abnormality. IMPRESSION: 1. No pulmonary embolism. 2. Emphysema. Pulmonary emphysema is an independent risk factor for lung cancer. Recommend consideration for evaluation for a low-dose CT lung cancer screening program. Electronically signed by: Norman Gatlin MD 02/16/2024 08:07 PM EST RP Workstation: [id number]   DG Chest Portable 1 View Result Date: 02/16/2024 CLINICAL DATA:  Cough. EXAM: PORTABLE CHEST 1 VIEW COMPARISON:  05/11/2021. FINDINGS: The heart size and mediastinal contours are within normal limits. There is elevation of the right diaphragm. Atelectasis is noted at the lung bases bilaterally. No effusion or pneumothorax is seen. 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 IMPRESSION: Elevation of the right diaphragm with atelectasis at the lung bases. Electronically Signed   By: Leita Birmingham M.D.   On: 02/16/2024 17:48     Procedures   Medications Ordered in the ED  sodium chloride  0.9 % bolus 1,000 mL (0 mLs Intravenous Stopped 02/16/24 2158)  potassium chloride  10 mEq in 100 mL IVPB (0 mEq Intravenous Stopped 02/16/24 2026)  iohexol  (OMNIPAQUE ) 350 MG/ML injection 100 mL (100 mLs Intravenous Contrast Given 02/16/24 1953)  piperacillin -tazobactam (ZOSYN ) IVPB 3.375 g (0 g Intravenous Stopped 02/16/24 2232)    Clinical Course as of 02/16/24 2342  Mon Feb 16, 2024  2200 Dr. Sebastian with  gener surgery will consult [BH]  2220 Dr. Franky with medicine for [facility] [BH]    Clinical Course User Index [BH] Katlynn Naser A, PA-C   68 year old here for evaluation of abnormal lab.  Was seen 3 days ago by urgent care for upper abdominal pain, nausea, fever.  He has a chronic cough.  They obtained labs her was told he likely had a viral infection.  Unfortunately family noticed results on MyChart today with a significant leukocytosis and subsequently came here due to persistent symptoms.  He is is tender to his epigastric region and right upper quadrant however negative Murphy sign.  He notes a chronic cough.  Does not appear grossly fluid overloaded however does appear mildly dehydrated.  Will plan on labs, imaging, reassess  Labs and imaging personally viewed interpreted CBC leukocytosis 18 Metabolic panel potassium 3.0, received IV supplementation, sodium 129 suspect likely due to poor p.o.  intake Lipase 30 Lactic 0.9 UA negative for infection Troponin less than 15 COVID, flu, RSV negative EKG did show some ST changes he denies chest pain however will plan on getting chest imaging as well as abdomen   Patient reassessed.  Plan on imaging CT abdomen pelvis with acute cholecystitis CTA chest with emphysema, no acute abnormality  Discussed result with patient, family in room.  They had me talk to their daughter via telephone while in room with patient.  We discussed labs versus imaging.  Family hesitant for surgical intervention at this time.  I recommend admission for IV antibiotics, consult with general surgery.  Patient and family agreeable.  Discussed with general surgery, Dr. Sebastian will follow along Discussed with Dr. Franky with medicine team who is agreeable to evaluate patient for admission  Patient started on Zosyn .  He was on Duricef chronically for his septic joint however shoulder today not red, warm I suspect his symptoms likely due to acute  cholecystitis.  Patient to be admitted for further workup and management  The patient appears reasonably stabilized for admission considering the current resources, flow, and capabilities available in the ED at this time, and I doubt any other [facility] requiring further screening and/or treatment in the ED prior to admission.                                  Medical Decision Making Amount and/or Complexity of Data Reviewed Independent Historian: spouse External Data Reviewed: labs, radiology, ECG and notes. Labs: ordered. Decision-making details documented in ED Course. Radiology: ordered and independent interpretation performed. Decision-making details documented in ED Course. ECG/medicine tests: ordered and independent interpretation performed. Decision-making details documented in ED Course.  Risk OTC drugs. Prescription drug management. Parenteral controlled substances. Decision regarding hospitalization. Diagnosis or treatment significantly limited by social determinants of health.        Final diagnoses:  Cholecystitis  Hypokalemia  Hyponatremia  Dehydration    ED Discharge Orders     None          Kjersti Dittmer A, PA-C 02/16/24 2342  "

## 2024-02-16 NOTE — ED Notes (Signed)
 Pt made aware of admit order to [facility]. Verbalizes understanding.

## 2024-02-16 NOTE — ED Triage Notes (Signed)
 Pt c/o stomach problems, onset Wednesday morning. States he has not had proper BM since Wednesday, I've tried everything. Denies urinary symptoms  Abx for infected shoulder since Feb, got til Feb to go.   Seen at [facility] for same, sent for eval of abnormal labs

## 2024-02-17 ENCOUNTER — Inpatient Hospital Stay (HOSPITAL_COMMUNITY): Admitting: Anesthesiology

## 2024-02-17 ENCOUNTER — Encounter (HOSPITAL_COMMUNITY)
Admission: EM | Disposition: A | Discharge status: Home / Self Care | Payer: Self-pay | Source: Ambulatory Visit | Attending: Internal Medicine

## 2024-02-17 ENCOUNTER — Encounter (HOSPITAL_COMMUNITY): Payer: Self-pay | Admitting: Internal Medicine

## 2024-02-17 DIAGNOSIS — E860 Dehydration: Secondary | ICD-10-CM | POA: Diagnosis not present

## 2024-02-17 DIAGNOSIS — E876 Hypokalemia: Secondary | ICD-10-CM | POA: Diagnosis not present

## 2024-02-17 DIAGNOSIS — K8000 Calculus of gallbladder with acute cholecystitis without obstruction: Secondary | ICD-10-CM

## 2024-02-17 DIAGNOSIS — G4730 Sleep apnea, unspecified: Secondary | ICD-10-CM

## 2024-02-17 DIAGNOSIS — K819 Cholecystitis, unspecified: Secondary | ICD-10-CM | POA: Diagnosis not present

## 2024-02-17 DIAGNOSIS — T849XXD Unspecified complication of internal orthopedic prosthetic device, implant and graft, subsequent encounter: Secondary | ICD-10-CM | POA: Diagnosis not present

## 2024-02-17 DIAGNOSIS — I10 Essential (primary) hypertension: Secondary | ICD-10-CM

## 2024-02-17 DIAGNOSIS — K810 Acute cholecystitis: Secondary | ICD-10-CM | POA: Diagnosis not present

## 2024-02-17 DIAGNOSIS — Z96612 Presence of left artificial shoulder joint: Secondary | ICD-10-CM | POA: Diagnosis not present

## 2024-02-17 DIAGNOSIS — E871 Hypo-osmolality and hyponatremia: Secondary | ICD-10-CM | POA: Diagnosis not present

## 2024-02-17 HISTORY — PX: CHOLECYSTECTOMY: SHX55

## 2024-02-17 LAB — COMPREHENSIVE METABOLIC PANEL WITH GFR
ALT: 40 U/L (ref 0–44)
AST: 37 U/L (ref 15–41)
Albumin: 4 g/dL (ref 3.5–5.0)
Alkaline Phosphatase: 261 U/L — ABNORMAL HIGH (ref 38–126)
Anion gap: 14 (ref 5–15)
BUN: 12 mg/dL (ref 8–23)
CO2: 26 mmol/L (ref 22–32)
Calcium: 10.1 mg/dL (ref 8.9–10.3)
Chloride: 91 mmol/L — ABNORMAL LOW (ref 98–111)
Creatinine, Ser: 0.81 mg/dL (ref 0.61–1.24)
GFR, Estimated: >60 mL/min
Glucose, Bld: 108 mg/dL — ABNORMAL HIGH (ref 70–99)
Potassium: 3.3 mmol/L — ABNORMAL LOW (ref 3.5–5.1)
Sodium: 131 mmol/L — ABNORMAL LOW (ref 135–145)
Total Bilirubin: 1.2 mg/dL (ref 0.0–1.2)
Total Protein: 8.1 g/dL (ref 6.5–8.1)

## 2024-02-17 LAB — CBC
HCT: 37.4 % — ABNORMAL LOW (ref 39.0–52.0)
Hemoglobin: 12.9 g/dL — ABNORMAL LOW (ref 13.0–17.0)
MCH: 29.5 pg (ref 26.0–34.0)
MCHC: 34.5 g/dL (ref 30.0–36.0)
MCV: 85.6 fL (ref 80.0–100.0)
Platelets: 280 K/uL (ref 150–400)
RBC: 4.37 MIL/uL (ref 4.22–5.81)
RDW: 11.9 % (ref 11.5–15.5)
WBC: 16.3 K/uL — ABNORMAL HIGH (ref 4.0–10.5)
nRBC: 0 % (ref 0.0–0.2)

## 2024-02-17 LAB — OSMOLALITY, URINE: Osmolality, Ur: 465 mosm/kg (ref 300–900)

## 2024-02-17 LAB — HIV ANTIBODY (ROUTINE TESTING W REFLEX): HIV Screen 4th Generation wRfx: NONREACTIVE

## 2024-02-17 LAB — CREATININE, URINE, RANDOM: Creatinine, Urine: 125 mg/dL

## 2024-02-17 LAB — PHOSPHORUS: Phosphorus: 3.5 mg/dL (ref 2.5–4.6)

## 2024-02-17 LAB — T4, FREE: Free T4: 1.33 ng/dL (ref 0.80–2.00)

## 2024-02-17 LAB — SODIUM, URINE, RANDOM: Sodium, Ur: 31 mmol/L

## 2024-02-17 LAB — OSMOLALITY: Osmolality: 271 mosm/kg — ABNORMAL LOW (ref 275–295)

## 2024-02-17 LAB — LACTIC ACID, PLASMA: Lactic Acid, Venous: 0.8 mmol/L (ref 0.5–1.9)

## 2024-02-17 LAB — TSH: TSH: 2.17 u[IU]/mL (ref 0.350–4.500)

## 2024-02-17 LAB — CK: Total CK: 228 U/L (ref 49–397)

## 2024-02-17 LAB — MAGNESIUM: Magnesium: 2.2 mg/dL (ref 1.7–2.4)

## 2024-02-17 MED ORDER — ONDANSETRON HCL 4 MG PO TABS: 4.0000 mg | ORAL_TABLET | Freq: Four times a day (QID) | ORAL | Status: DC | PRN

## 2024-02-17 MED ORDER — FENTANYL CITRATE (PF) 50 MCG/ML IJ SOSY
PREFILLED_SYRINGE | INTRAMUSCULAR | Status: AC
  Filled 2024-02-17: qty 1

## 2024-02-17 MED ORDER — SUGAMMADEX SODIUM 200 MG/2ML IV SOLN
INTRAVENOUS | Status: DC | PRN
  Administered 2024-02-17: 200 mg via INTRAVENOUS

## 2024-02-17 MED ORDER — LACTATED RINGERS IV SOLN: INTRAVENOUS | Status: DC | PRN

## 2024-02-17 MED ORDER — LACTATED RINGERS IV SOLN: INTRAVENOUS | Status: DC

## 2024-02-17 MED ORDER — ACETAMINOPHEN 500 MG PO TABS
1000.0000 mg | ORAL_TABLET | Freq: Four times a day (QID) | ORAL | Status: DC
  Administered 2024-02-17 – 2024-02-27 (×34): 1000 mg via ORAL
  Filled 2024-02-17 (×20): qty 2

## 2024-02-17 MED ORDER — ALBUMIN HUMAN 5 % IV SOLN: INTRAVENOUS | Status: DC | PRN

## 2024-02-17 MED ORDER — MIDAZOLAM HCL 2 MG/2ML IJ SOLN
INTRAMUSCULAR | Status: AC
  Filled 2024-02-17: qty 2

## 2024-02-17 MED ORDER — ONDANSETRON HCL 4 MG/2ML IJ SOLN: 4.0000 mg | Freq: Once | INTRAMUSCULAR | Status: DC | PRN

## 2024-02-17 MED ORDER — PROPOFOL 10 MG/ML IV BOLUS
INTRAVENOUS | Status: AC
  Filled 2024-02-17: qty 20

## 2024-02-17 MED ORDER — LIDOCAINE HCL (PF) 2 % IJ SOLN
INTRAMUSCULAR | Status: AC
  Filled 2024-02-17: qty 5

## 2024-02-17 MED ORDER — LACTATED RINGERS IR SOLN
Status: DC | PRN
  Administered 2024-02-17: 1000 mL

## 2024-02-17 MED ORDER — ROCURONIUM BROMIDE 10 MG/ML (PF) SYRINGE
PREFILLED_SYRINGE | INTRAVENOUS | Status: DC | PRN
  Administered 2024-02-17: 50 mg via INTRAVENOUS
  Administered 2024-02-17: 20 mg via INTRAVENOUS

## 2024-02-17 MED ORDER — HEMOSTATIC AGENTS (NO CHARGE) OPTIME
TOPICAL | Status: DC | PRN
  Administered 2024-02-17 (×3): 1

## 2024-02-17 MED ORDER — INDOCYANINE GREEN 25 MG IJ SOLR
1.2500 mg | Freq: Once | INTRAMUSCULAR | Status: AC
  Administered 2024-02-17: 1.25 mg via INTRAVENOUS
  Filled 2024-02-17: qty 10

## 2024-02-17 MED ORDER — ALPRAZOLAM 0.5 MG PO TABS
0.5000 mg | ORAL_TABLET | Freq: Three times a day (TID) | ORAL | Status: DC | PRN
  Administered 2024-02-19 – 2024-02-24 (×5): 0.5 mg via ORAL
  Filled 2024-02-17 (×3): qty 1

## 2024-02-17 MED ORDER — DEXAMETHASONE SOD PHOSPHATE PF 10 MG/ML IJ SOLN
INTRAMUSCULAR | Status: DC | PRN
  Administered 2024-02-17: 4 mg via INTRAVENOUS

## 2024-02-17 MED ORDER — DIPHENHYDRAMINE HCL 50 MG/ML IJ SOLN: 12.5000 mg | Freq: Four times a day (QID) | INTRAMUSCULAR | Status: DC | PRN

## 2024-02-17 MED ORDER — DIPHENHYDRAMINE HCL 12.5 MG/5ML PO ELIX: 12.5000 mg | ORAL_SOLUTION | Freq: Four times a day (QID) | ORAL | Status: DC | PRN

## 2024-02-17 MED ORDER — POTASSIUM CHLORIDE IN NACL 20-0.9 MEQ/L-% IV SOLN
INTRAVENOUS | Status: AC
  Filled 2024-02-17 (×2): qty 1000

## 2024-02-17 MED ORDER — PHENYLEPHRINE 80 MCG/ML (10ML) SYRINGE FOR IV PUSH (FOR BLOOD PRESSURE SUPPORT)
PREFILLED_SYRINGE | INTRAVENOUS | Status: DC | PRN
  Administered 2024-02-17: 160 ug via INTRAVENOUS

## 2024-02-17 MED ORDER — FENTANYL CITRATE (PF) 250 MCG/5ML IJ SOLN
INTRAMUSCULAR | Status: AC
  Filled 2024-02-17: qty 5

## 2024-02-17 MED ORDER — ONDANSETRON HCL 4 MG/2ML IJ SOLN
INTRAMUSCULAR | Status: AC
  Filled 2024-02-17: qty 2

## 2024-02-17 MED ORDER — PIPERACILLIN-TAZOBACTAM 3.375 G IVPB
3.3750 g | Freq: Three times a day (TID) | INTRAVENOUS | Status: DC
  Administered 2024-02-17 (×2): 3.375 g via INTRAVENOUS
  Filled 2024-02-17 (×2): qty 50

## 2024-02-17 MED ORDER — CHLORHEXIDINE GLUCONATE 0.12 % MT SOLN
15.0000 mL | Freq: Once | OROMUCOSAL | Status: AC
  Administered 2024-02-17: 15 mL via OROMUCOSAL

## 2024-02-17 MED ORDER — FENTANYL CITRATE (PF) 50 MCG/ML IJ SOSY
25.0000 ug | PREFILLED_SYRINGE | INTRAMUSCULAR | Status: DC | PRN
  Administered 2024-02-17: 50 ug via INTRAVENOUS

## 2024-02-17 MED ORDER — BUPIVACAINE-EPINEPHRINE 0.25% -1:200000 IJ SOLN
INTRAMUSCULAR | Status: DC | PRN
  Administered 2024-02-17: 30 mL

## 2024-02-17 MED ORDER — ROCURONIUM BROMIDE 10 MG/ML (PF) SYRINGE
PREFILLED_SYRINGE | INTRAVENOUS | Status: AC
  Filled 2024-02-17: qty 10

## 2024-02-17 MED ORDER — FENTANYL CITRATE (PF) 50 MCG/ML IJ SOSY: 12.5000 ug | PREFILLED_SYRINGE | INTRAMUSCULAR | Status: DC | PRN

## 2024-02-17 MED ORDER — METOPROLOL SUCCINATE ER 25 MG PO TB24
100.0000 mg | ORAL_TABLET | Freq: Every day | ORAL | Status: DC
  Administered 2024-02-17 – 2024-02-22 (×5): 100 mg via ORAL
  Filled 2024-02-17: qty 2
  Filled 2024-02-17: qty 4
  Filled 2024-02-17: qty 2
  Filled 2024-02-17 (×2): qty 1
  Filled 2024-02-17: qty 4

## 2024-02-17 MED ORDER — ONDANSETRON HCL 4 MG/2ML IJ SOLN: 4.0000 mg | Freq: Four times a day (QID) | INTRAMUSCULAR | Status: DC | PRN

## 2024-02-17 MED ORDER — SIMETHICONE 80 MG PO CHEW
40.0000 mg | CHEWABLE_TABLET | Freq: Four times a day (QID) | ORAL | Status: DC | PRN
  Administered 2024-02-18 – 2024-02-23 (×5): 40 mg via ORAL
  Filled 2024-02-17 (×4): qty 1

## 2024-02-17 MED ORDER — PIPERACILLIN-TAZOBACTAM 3.375 G IVPB
3.3750 g | Freq: Three times a day (TID) | INTRAVENOUS | Status: AC
  Administered 2024-02-17 – 2024-02-18 (×3): 3.375 g via INTRAVENOUS
  Filled 2024-02-17 (×2): qty 50

## 2024-02-17 MED ORDER — TRAMADOL HCL 50 MG PO TABS
50.0000 mg | ORAL_TABLET | Freq: Four times a day (QID) | ORAL | Status: DC | PRN
  Administered 2024-02-18 – 2024-02-19 (×2): 50 mg via ORAL
  Filled 2024-02-17 (×3): qty 1

## 2024-02-17 MED ORDER — LIDOCAINE HCL (PF) 2 % IJ SOLN
INTRAMUSCULAR | Status: DC | PRN
  Administered 2024-02-17: 60 mg via INTRADERMAL

## 2024-02-17 MED ORDER — BUPIVACAINE-EPINEPHRINE (PF) 0.25% -1:200000 IJ SOLN
INTRAMUSCULAR | Status: AC
  Filled 2024-02-17: qty 30

## 2024-02-17 MED ORDER — FENTANYL CITRATE (PF) 250 MCG/5ML IJ SOLN
INTRAMUSCULAR | Status: DC | PRN
  Administered 2024-02-17 (×5): 50 ug via INTRAVENOUS

## 2024-02-17 MED ORDER — FENTANYL CITRATE (PF) 50 MCG/ML IJ SOSY
25.0000 ug | PREFILLED_SYRINGE | INTRAMUSCULAR | Status: DC | PRN
  Administered 2024-02-18 – 2024-02-20 (×3): 50 ug via INTRAVENOUS
  Filled 2024-02-17 (×3): qty 1

## 2024-02-17 MED ORDER — MIDAZOLAM HCL (PF) 2 MG/2ML IJ SOLN
INTRAMUSCULAR | Status: DC | PRN
  Administered 2024-02-17: 2 mg via INTRAVENOUS

## 2024-02-17 MED ORDER — SODIUM CHLORIDE 0.9 % IV SOLN: INTRAVENOUS | Status: DC

## 2024-02-17 MED ORDER — 0.9 % SODIUM CHLORIDE (POUR BTL) OPTIME
TOPICAL | Status: DC | PRN
  Administered 2024-02-17: 1000 mL

## 2024-02-17 MED ORDER — PROPOFOL 10 MG/ML IV BOLUS
INTRAVENOUS | Status: DC | PRN
  Administered 2024-02-17: 200 mg via INTRAVENOUS

## 2024-02-17 MED ORDER — ONDANSETRON HCL 4 MG/2ML IJ SOLN
INTRAMUSCULAR | Status: DC | PRN
  Administered 2024-02-17: 4 mg via INTRAVENOUS

## 2024-02-17 NOTE — Assessment & Plan Note (Signed)
 Hold home antibiotics for now on Zosyn 

## 2024-02-17 NOTE — Assessment & Plan Note (Signed)
 Replace and recheck Check mag level

## 2024-02-17 NOTE — Op Note (Addendum)
 Nathan Stewart [id number] 04-Jul-1955 02/17/2024  Laparoscopic Cholecystectomy with attempted near infrared fluorescent cholangiography procedure Note  Indications: This patient presents with symptomatic gallbladder disease and will undergo laparoscopic cholecystectomy.  Pre-operative Diagnosis: Acute calculous cholecystitis  Post-operative Diagnosis: Severe gangrenous acute calculous cholecystitis  Surgeon: Camellia Blush MD FACS  Assistants: Deward Null, MD, FACS (an assistant was requested due to the severe inflammation involving the gallbladder and surrounding structures, need for tissue retraction and assistance in identifying critical structures)  Anesthesia: General endotracheal anesthesia  Procedure Details  The patient was seen again in the Holding Room. The risks, benefits, complications, treatment options, and expected outcomes were discussed with the patient. The possibilities of reaction to medication, pulmonary aspiration, perforation of viscus, bleeding, recurrent infection, finding a normal gallbladder, the need for additional procedures, failure to diagnose a condition, the possible need to convert to an open procedure, and creating a complication requiring transfusion or operation were discussed with the patient. The likelihood of improving the patient's symptoms with return to their baseline status is good.  The patient and/or family concurred with the proposed plan, giving informed consent. The site of surgery properly noted. The patient was taken to Operating Room, identified as Nathan Stewart and the procedure verified as Laparoscopic Cholecystectomy with ICG dye.  A Time Out was held and the above information confirmed. Antibiotic prophylaxis was administered.    ICG dye was administered preoperatively.    General endotracheal anesthesia was then administered and tolerated well. After the induction, the abdomen was prepped with Chloraprep and draped in the sterile fashion. The  patient was positioned in the supine position.  Local anesthetic agent was injected into the skin near the umbilicus and an incision made. We dissected down to the abdominal fascia with blunt dissection.  The fascia was incised vertically and we entered the peritoneal cavity bluntly.  A pursestring suture of 0-Vicryl was placed around the fascial opening.  The Hasson cannula was inserted and secured with the stay suture.  Pneumoperitoneum was then created with CO2 and tolerated well without any adverse changes in the patient's vital signs. An 5-mm port was placed in the subxiphoid position.  Two 5-mm ports were placed in the right upper quadrant. All skin incisions were infiltrated with a local anesthetic agent before making the incision and placing the trocars.   We positioned the patient in reverse Trendelenburg, tilted slightly to the patient's left.  The gallbladder was not immediately visualized.  There was a wad of omentum encasing it which had to be pulled away which took a little bit of effort.  This was all consistent with Parkland class V cholecystitis.  The gallbladder was intrahepatic thick walled and distended.  There was some evidence of necrosis or gangrene in the dome of the gallbladder.  In order to facilitate retraction the gallbladder was aspirated.  , the fundus was grasped and retracted cephalad by the assistant. Adhesions were lysed bluntly and with the electrocautery where indicated, taking care not to injure any adjacent organs or viscus. The infundibulum was grasped and retracted laterally, exposing the peritoneum overlying the triangle of Calot.  There was severe inflammation involving the triangle of Coelho and the hepatic cystic triangle.  Using some blunt dissection we were able to confirm that we were lifting up on the infundibulum.  Using careful dissection with the suction irrigator catheter, hook electrocautery and Maryland  dissector we were able to get around the cystic duct.   We did enter the gallbladder slightly above this  just above the infundibulum.  This did appear to be the cystic duct as it was a tubular structure entering the infundibulum.  We also identified what appeared to be the lymph node.  I was able to get a window around the cystic duct with a Maryland  dissector.  Near infrared fluorescent activity was only visualized in the liver.  Due to the chronic inflammation and friable tissue overlying the more medial structures we saw no fluorescein activity anywhere other than the liver. We saw no other tubular structure entering the gallbladder. We were able to achieve a window medially to this tubular structure clearly going into the infundibulum.  The cystic duct was starting to pull apart due to its severe inflammation.  I was able to put two 5 mm clips across the patient's side of the the cystic duct and 1 as it entered the infundibulum.  Then using scissors it was transected.  I then placed 2 clip across what appeared to be a cystic artery.  I then started using the hook electrocautery to lift the gallbladder up off of the gallbladder fossa.  We got into some bleeding at this time.  There was some pulsatile bleeding.  It was unclear if this was a branch of the cystic artery.  It took a few minutes to achieve control of the bleeding.  During this time there was probably about 250 cc of blood loss.  We would clamp the bleeding structure with a [place] .  I ended up upsizing the subxiphoid trocar to a 12 mm trocar and my assistant ended up having an additional 5 mm trocar in the right upper quadrant.  We were ultimately able to get 2 clips across the structure and achieve hemostasis.  We placed a piece of surgical snow over it.  We evacuated the abdomen of the old blood.  There appeared to be no more bleeding from this location.  There was no evidence of ongoing pulsatile bleeding.  There was no oozing either.  I then started mobilizing the gallbladder up by the gallbladder  fossa.  There was no plane between the posterior wall of the gallbladder and the liver.  The gallbladder was intrahepatic.  We were ultimately able to free the gallbladder from the liver.  The dome was necrotic.  This freed the gallbladder in its entirety.   The gallbladder was removed and placed in an specimen retrieval sac.  The gallbladder and Ecco sac were then removed through the umbilical port site. The liver bed was irrigated and inspected. Hemostasis was achieved with the electrocautery. Copious irrigation was utilized and was repeatedly aspirated until clear.  I did elect to leave a surgical drain in the right upper quadrant just in case if the patient developed a postoperative bile leak since the cystic duct was partially necrotic.  Another piece of surgical snow was placed in the gallbladder fossa along with Surgicel powder.  The pursestring suture was used to close the umbilical fascia.  An additional interrupted 0 Vicryl was placed using a PMI suture passer with laparoscopic guidance  We again inspected the right upper quadrant for hemostasis.  The umbilical closure was inspected and there was no air leak and nothing trapped within the closure. Pneumoperitoneum was released as we removed the trocars.  4-0 Monocryl was used to close the skin.   Steri-Strips and bandages were applied. The patient was then extubated and brought to the recovery room in stable condition. Instrument, sponge, and needle counts were correct at closure and  at the conclusion of the case.   Findings: Severe acute gangrenous cholecystitis with Cholelithiasis Parkland class V Bleeding from arterial structure in the liver bed probably cystic artery.  There is no signs of liver ischemia at the conclusion of the procedure.  Bleeding ultimately controlled with surgical clips.  Placement of 2 pieces of surgical snow and Surgicel powder   Estimated Blood Loss: 275cc         Drains: round 89fr          Specimens: Gallbladder            Complications: None; patient tolerated the procedure well.         Disposition: PACU - hemodynamically stable.         Condition: stable  Camellia HERO. Tanda, MD, FACS General, Bariatric, & Minimally Invasive Surgery [facility] Surgery,  A [facility]

## 2024-02-17 NOTE — Anesthesia Procedure Notes (Addendum)
 Procedure Name: Intubation Date/Time: 02/17/2024 2:21 PM  Performed by: Lucious Debby BRAVO, MDPre-anesthesia Checklist: Patient identified, Emergency Drugs available, Suction available and Patient being monitored Patient Re-evaluated:Patient Re-evaluated prior to induction Oxygen Delivery Method: Circle system utilized Preoxygenation: Pre-oxygenation with 100% oxygen Induction Type: IV induction Ventilation: Mask ventilation without difficulty Laryngoscope Size: Miller and 2 Grade View: Grade I Tube type: Oral Tube size: 7.5 mm Number of attempts: 1 Airway Equipment and Method: Stylet and Oral airway Placement Confirmation: ETT inserted through vocal cords under direct vision, positive ETCO2 and breath sounds checked- equal and bilateral Secured at: 22 cm Tube secured with: Tape Dental Injury: Teeth and Oropharynx as per pre-operative assessment  Comments: Patient is a grade 1 view with Mil 2 blade, easy intubation. Previous difficulty related to ELECTIVE glidescope intubations for his previous cervical surgery and subsequent procedures. No documented history of actual difficult intubation.

## 2024-02-17 NOTE — Assessment & Plan Note (Signed)
 admit for IV rehydration and pain control, bowel rest, and empiric antibiotics (zosyn )   Appreciate general surgery consult

## 2024-02-17 NOTE — Assessment & Plan Note (Signed)
 In the setting of poor p.o. intake and dehydration will rehydrate obtain urine electrolytes follow sodium levels

## 2024-02-17 NOTE — Assessment & Plan Note (Signed)
 Resume toprol100 mg tomorrow

## 2024-02-17 NOTE — Subjective & Objective (Incomplete)
 Patient has been having intermittent right upper quadrant pain radiating to the back decreased appetite nausea no diarrhea patient started this epigastric but became more progressive

## 2024-02-17 NOTE — Discharge Instructions (Addendum)
 [facility]  Please arrive at least 30 min before your appointment to complete your check in paperwork.  If you are unable to arrive 30 min prior to your appointment time we may have to cancel or reschedule you. LAPAROSCOPIC SURGERY: POST OP INSTRUCTIONS Always review your discharge instruction sheet given to you by the facility where your surgery was performed. IF YOU HAVE DISABILITY OR FAMILY LEAVE FORMS, YOU MUST BRING THEM TO THE OFFICE FOR PROCESSING.   DO NOT GIVE THEM TO YOUR DOCTOR.  PAIN CONTROL  First take acetaminophen  (Tylenol ) AND/or ibuprofen (Advil) to control your pain after surgery.  Follow directions on package.  Taking acetaminophen  (Tylenol ) and/or ibuprofen (Advil) regularly after surgery will help to control your pain and lower the amount of prescription pain medication you may need.  You should not take more than 4,000 mg (4 grams) of acetaminophen  (Tylenol ) in 24 hours.  You should not take ibuprofen (Advil), aleve, motrin, naprosyn or other NSAIDS if you have a history of stomach ulcers or chronic kidney disease.  A prescription for pain medication may be given to you upon discharge.  Take your pain medication as prescribed, if you still have uncontrolled pain after taking acetaminophen  (Tylenol ) or ibuprofen (Advil). Use ice packs to help control pain. If you need a refill on your pain medication, please contact your pharmacy.  They will contact our office to request authorization. Prescriptions will not be filled after 5pm or on week-ends.  HOME MEDICATIONS Take your usually prescribed medications unless otherwise directed.  DIET You should follow a light diet the first few days after arrival home.  Be sure to include lots of fluids daily. Avoid fatty, fried foods.   CONSTIPATION It is common to experience some constipation after surgery and if you are taking pain medication.  Increasing fluid intake and taking a stool softener (such as Colace)  will usually help or prevent this problem from occurring.  A mild laxative (Milk of Magnesia or Miralax ) should be taken according to package instructions if there are no bowel movements after 48 hours.  WOUND/INCISION CARE Most patients will experience some swelling and bruising in the area of the incisions.  Ice packs will help.  Swelling and bruising can take several days to resolve.  Unless discharge instructions indicate otherwise, follow guidelines below  STERI-STRIPS - you may remove your outer bandages 48 hours after surgery, and you may shower at that time.  You have steri-strips (small skin tapes) in place directly over the incision.  These strips should be left on the skin for 7-10 days.   DERMABOND/SKIN GLUE - you may shower in 24 hours.  The glue will flake off over the next 2-3 weeks. Any sutures or staples will be removed at the office during your follow-up visit.  ACTIVITIES You may resume regular (light) daily activities beginning the next day--such as daily self-care, walking, climbing stairs--gradually increasing activities as tolerated.  You may have sexual intercourse when it is comfortable.  Refrain from any heavy lifting or straining until approved by your doctor. You may drive when you are no longer taking prescription pain medication, you can comfortably wear a seatbelt, and you can safely maneuver your car and apply brakes.  FOLLOW-UP You should see your doctor in the office for a follow-up appointment approximately 2-3 weeks after your surgery.  You should have been given your post-op/follow-up appointment when your surgery was scheduled.  If you did not receive a post-op/follow-up appointment, make sure  that you call for this appointment within a day or two after you arrive home to insure a convenient appointment time.   WHEN TO CALL YOUR DOCTOR: Fever over 101.0 Inability to urinate Continued bleeding from incision. Increased pain, redness, or drainage from the  incision. Increasing abdominal pain  The clinic staff is available to answer your questions during regular business hours.  Please dont hesitate to call and ask to speak to one of the nurses for clinical concerns.  If you have a medical emergency, go to the nearest emergency room or call 911.  A surgeon from [facility] is always on call at the hospital. [address], [place], [place]  [address] ? [address] [address], [place], [place]   [address] [phone] ? [phone] ? FAX [phone] Interventional Radiology Percutaneous Abscess Drain Placement After Care   This sheet gives you information about how to care for yourself after your procedure. Your health care provider may also give you more specific instructions. Your drain was placed by an interventional radiologist with [facility]. If you have questions or concerns, contact [facility] at [phone].   What is a percutaneous drain?   A drain is a small plastic tube (catheter) that goes into the fluid collection in your body through your skin.   How long will I need the drain?   How long the drain needs to stay in is determined by where the drain is, how much comes out of the drain each day and if you are having any other surgical procedures.   Interventional radiology will determine when it is time to remove the drain. It is important to follow up as directed so that the drain can be removed as soon as it is safe to do so.   What can I expect after the procedure?   After the procedure, it is common to have:   A small amount of bruising and discomfort in the area where the drainage tube (catheter) was placed.   Sleepiness and fatigue. This should go away after the medicines you were given have worn off.   Follow these instructions at home:   Insertion site care   Check your insertion site when you change the bandage. Check for:   More redness, swelling, or pain.   More  fluid or blood.   Warmth.   Pus or a bad smell.   When caring for your insertion site:   Wash your hands with soap and water  for at least 20 seconds before and after you change your bandage (dressing). If soap and water  are not available, use hand sanitizer.   You do not need to change your dressing everyday if it is clean and dry. Change your dressing every 3 days or as needed when it is soiled, wet or becoming dislodged. You will need to change your dressing each time you shower.   Leave stitches (sutures), skin glue, or adhesive strips in place. These skin closures may need to stay in place for 2 weeks or longer. If adhesive strip edges start to loosen and curl up, you may trim the loose edges. Do not remove adhesive strips completely unless your health care provider tells you to do so.   Catheter care   Flush the catheter once per day with 5 mL of 0.9% normal saline unless you are told otherwise by your healthcare provider. This helps to prevent clogs in the catheter.   To disconnect the drain, turn the  clear plastic tube to the left. Attach the saline syringe by placing it on the white end of the drain and turning gently to the right. Once attached gently push the plunger to the 5 mL mark. After you are done flushing, disconnect the syringe by turning to the left and reattach your drainage container   If you have a bulb please be sure the bulb is charged after reconnecting it - to do this pinch the bulb between your thumb and first finger and close the stopper located on the top of the bulb.    Check for fluid leaking from around your catheter (instead of fluid draining through your catheter). This may be a sign that the drain is no longer working correctly.   Write down the following information every time you empty your bag:   The date and time.   The amount of drainage.   Activity   Rest at home for 1-2 days after your procedure.   For the first 48 hours do not lift anything  more than 10 lbs (about a gallon of milk). You may perform moderate activities/exercise. Please avoid strenuous activities during this time.   Avoid any activities which may pull on your drain as this can cause your drain to become dislodged.   If you were given a sedative during the procedure, it can affect you for several hours. Do not drive or operate machinery until your health care provider says that it is safe.   General instructions   For mild pain take over-the-counter medications as needed for pain such as Tylenol  or Advil. If you are experiencing severe pain please call our office as this may indicate an issue with your drain.    If you were prescribed an antibiotic medicine, take it as told by your health care provider. Do not stop using the antibiotic even if you start to feel better.   You may shower 24 hours after the drain is placed. To do this cover the insertion site with a water  tight material such as saran wrap and seal the edges with tape, you may also purchase waterproof dressings at your local drug store. Shower as usual and then remove the water  tight dressing and any gauze/tape underneath it once you have exited the shower and dried off. Allow the area to air dry or pat dry with a clean towel. Once the skin is completely dry place a new gauze dressing. It is important to keep the site dry at all times to prevent infection.   Do not submerge the drain - this means you cannot take baths, swim, use a hot tub, etc. until the drain is removed.    Do not use any products that contain nicotine or tobacco, such as cigarettes, e-cigarettes, and chewing tobacco. If you need help quitting, ask your health care provider.   Keep all follow-up visits as told by your health care provider. This is important.   Contact a health care provider if:   You have less than 10 mL of drainage a day for 2-3 days in a row, or as directed by your health care provider.   You have any of these signs  of infection:   More redness, swelling, or pain around your incision area.   More fluid or blood coming from your incision area.   Warmth coming from your incision area.   Pus or a bad smell coming from your incision area.   You have fluid leaking from around your catheter (  instead of through your catheter).   You are unable to flush the drain.   You have a fever or chills.   You have pain that does not get better with medicine.   You have not been contacted to schedule a drain follow up appointment within 10 days of discharge from the hospital.   Please call [facility] at [phone] with any questions or concerns.   Get help right away if:   Your catheter comes out.   You suddenly stop having drainage from your catheter.   You suddenly have blood in the fluid that is draining from your catheter.   You become dizzy or you faint.   You develop a rash.   You have nausea or vomiting.   You have difficulty breathing or you feel short of breath.   You develop chest pain.   You have problems with your speech or vision.   You have trouble balancing or moving your arms or legs.   Summary   It is common to have a small amount of bruising and discomfort in the area where the drainage tube (catheter) was placed. You may also have minor discomfort with movement while the drain is in place.   Flush the drain once per day with 5 mL of 0.9% normal saline (unless you were told otherwise by your healthcare provider).    Record the amount of drainage from the bag every time you empty it.   Change the dressing every 3 days or earlier if soiled/wet. Keep the skin dry under the dressing.   You may shower with the drain in place. Do not submerge the drain (no baths, swimming, hot tubs, etc.).   Contact  Radiology at [phone] if you have more redness, swelling, or pain around your incision area or if you have pain that does not get better with medicine.    This information is not intended to replace advice given to you by your health care provider. Make sure you discuss any questions you have with your health care provider.   Document Revised: 05/17/2021 Document Reviewed: 02/06/2019   Elsevier Patient Education  2023 [employer]         Interventional Radiology Drain Record   Empty your drain at least once per day. You may empty it as often as needed. Use this form to write down the amount of fluid that has collected in the drainage container. Bring this form with you to your follow-up visits. Please call [facility] at [phone] with any questions or concerns prior to your appointment.   Drain #1 location: ___________________   Date __________ Time __________ Amount __________   Date __________ Time __________ Amount __________   Date __________ Time __________ Amount __________   Date __________ Time __________ Amount __________   Date __________ Time __________ Amount __________   Date __________ Time __________ Amount __________   Date __________ Time __________ Amount __________   Date __________ Time __________ Amount __________   Date __________ Time __________ Amount __________   Date __________ Time __________ Amount __________   Date __________ Time __________ Amount __________   Date __________ Time __________ Amount __________   Date __________ Time __________ Amount __________   Date __________ Time __________ Amount __________

## 2024-02-17 NOTE — Assessment & Plan Note (Signed)
 Rehydrate and follow sodium level check orthostatics prior to discharge

## 2024-02-17 NOTE — Plan of Care (Signed)

## 2024-02-17 NOTE — Anesthesia Postprocedure Evaluation (Signed)
"   Anesthesia Post Note  Patient: Nathan Stewart  Procedure(s) Performed: LAPAROSCOPIC CHOLECYSTECTOMY     Patient location during evaluation: PACU Anesthesia Type: General Level of consciousness: awake and alert Pain management: pain level controlled Vital Signs Assessment: post-procedure vital signs reviewed and stable Respiratory status: spontaneous breathing, nonlabored ventilation and respiratory function stable Cardiovascular status: stable and blood pressure returned to baseline Anesthetic complications: no   No notable events documented.  Last Vitals:  Vitals:   02/17/24 1645 02/17/24 1654  BP: (!) 166/80 (!) 173/77  Pulse: 86 88  Resp: 19 18  Temp: 36.4 C 36.8 C  SpO2: 96% 97%    Last Pain:  Vitals:   02/17/24 1654  TempSrc: Oral  PainSc:                  Debby FORBES Like      "

## 2024-02-17 NOTE — Progress Notes (Signed)
" ° ° °  PROCEDURAL EXPEDITER PROGRESS NOTE  Patient Name: Nathan Stewart  DOB:06-17-1955 Date of Admission: 02/16/2024  Date of Assessment:02/17/2024   -------------------------------------------------------------------------------------------------------------------   Brief clinical summary: pt has a Hx of left arthroplasty with MSSAon chronic antibiotics, schedule for a lap cholecystectomy today   Orders in place:  Yes   Communication with surgical team if no orders: n/a  Labs, test, and orders reviewed: yes  Requires surgical clearance:  Yes  What type of clearance:   Clearance received: n/a  Barriers noted:n/a   Intervention provided by [facility] team: reached out to nurse to get pt ready for surgery by secure chat   Barrier resolved:  not applicable   -------------------------------------------------------------------------------------------------------------------  [facility] [employer], Ronal DELENA Bald Please contact us  directly via secure chat (search for [facility]) or by calling us  at [phone] [facility]).  "

## 2024-02-17 NOTE — Hospital Course (Signed)
 68 y.o. 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  He states the pain resolved, but for the last week he has been having nausea and unable to eat. He has been drinking though and able to keep it down. He last had a BM last Wednesday and none since then. He was sent to the UC for evaluation and noted to have a WBC of 24K on Sunday. He was found to have acute cholecystitis (no stones seen on CT) on a CT scan. His WBC 18.0 in ED.  General surgery consulted and pt taken to surgery for lap chole on 02/17/24.

## 2024-02-17 NOTE — Progress Notes (Signed)
 "          PROGRESS NOTE  Nathan Stewart FMW:[id number] DOB: [birth year] DOA: 02/16/2024 PCP: [name], MD  Brief History:  68 y.o. 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  He states the pain resolved, but for the last week he has been having nausea and unable to eat. He has been drinking though and able to keep it down. He last had a BM last Wednesday and none since then. He was sent to the UC for evaluation and noted to have a WBC of 24K on Sunday. He was found to have acute cholecystitis (no stones seen on CT) on a CT scan. His WBC 18.0 in ED.  General surgery consulted and pt taken to surgery for lap chole on 02/17/24.      Assessment/Plan: Acute cholecystitis -continue zosyn  -Appreciate general surgery consult>>lap chole on 12/23 -12/23--discussed with gen surgery>>necrotic GB and cystic duct -order stat CBC/CMP tonight if hypotensive or tachycardic as there was an arterial structure that bled that was controlled with a clip.    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 intake and dehydration -improving with IVF           Consultants:  general surgery  Code Status:  FULL   DVT Prophylaxis:  SCD   Procedures: As Listed in Progress Note Above  Antibiotics: Zosyn  12/23>>        Subjective: Pt has post op pain.  Denies vomiting, cp, sob, f/c  Objective: Vitals:   02/17/24 0414 02/17/24 0804 02/17/24 1013 02/17/24 1229  BP: (!) 143/76  (!) 142/72 (!) 187/90  Pulse: 87  84 89  Resp: 17  18 18   Temp:  (!) 97.3 F (36.3 C)  98.7 F (37.1 C) 99.4 F (37.4 C)  TempSrc: Oral   Oral  SpO2: 96%  98% 95%  Weight:  103 kg    Height:        Intake/Output Summary (Last 24 hours) at 02/17/2024 1232 Last data filed at 02/17/2024 1013 Gross per 24 hour  Intake 594.3 ml  Output 400 ml  Net 194.3 ml   Weight change:  Exam:  General:  Pt is alert, follows commands appropriately, not in acute distress HEENT: No icterus, No thrush, No neck mass, [place]/AT Cardiovascular: RRR, S1/S2, no rubs, no gallops Respiratory: CTA bilaterally, no wheezing, no crackles, no rhonchi Abdomen: Soft/+BS, mild upper abd tender, non distended, no guarding Extremities: No edema, No lymphangitis, No petechiae, No rashes, no synovitis   Data Reviewed: I have personally reviewed following labs and imaging studies Basic Metabolic Panel: Recent Labs  Lab 02/13/24 1722 02/16/24 1720 02/17/24 0042  NA 130* 129* 131*  K 3.3* 3.0* 3.3*  CL 89* 87* 91*  CO2 20 26 26   GLUCOSE 120* 112* 108*  BUN 14 12 12   CREATININE 1.00 0.88 0.81  CALCIUM 9.8 10.8* 10.1  MG  --   --  2.2  PHOS  --   --  3.5   Liver Function Tests: Recent Labs  Lab 02/13/24  1722 02/16/24 1720 02/17/24 0042  AST 20 29 37  ALT 29 34 40  ALKPHOS 100 122 261*  BILITOT 1.4* 1.1 1.2  PROT 7.4 8.0 8.1  ALBUMIN  4.7 4.1 4.0   Recent Labs  Lab 02/13/24 1722 02/16/24 1720  LIPASE 16 30   No results for input(s): AMMONIA in the last 168 hours. Coagulation Profile: No results for input(s): INR, PROTIME in the last 168 hours. CBC: Recent Labs  Lab 02/13/24 1722 02/16/24 1720 02/17/24 0042  WBC 24.4* 18.0* 16.3*  NEUTROABS 21.5*  --   --   HGB 14.8 13.5 12.9*  HCT 42.8 38.6* 37.4*  MCV 88 85.0 85.6  PLT 234 273 280   Cardiac Enzymes: Recent Labs  Lab 02/17/24 0043  CKTOTAL 228   BNP: Invalid input(s): POCBNP CBG: No results for input(s): GLUCAP in the last 168 hours. HbA1C: No results for input(s): HGBA1C in  the last 72 hours. Urine analysis:    Component Value Date/Time   COLORURINE YELLOW 02/16/2024 1840   APPEARANCEUR CLEAR 02/16/2024 1840   LABSPEC 1.007 02/16/2024 1840   PHURINE 7.0 02/16/2024 1840   GLUCOSEU NEGATIVE 02/16/2024 1840   HGBUR SMALL (A) 02/16/2024 1840   HGBUR trace-lysed 03/20/2010 0814   BILIRUBINUR NEGATIVE 02/16/2024 1840   BILIRUBINUR small (A) 02/13/2024 1641   BILIRUBINUR n 06/07/2013 1107   KETONESUR NEGATIVE 02/16/2024 1840   PROTEINUR NEGATIVE 02/16/2024 1840   UROBILINOGEN 1.0 02/13/2024 1641   UROBILINOGEN 0.2 11/16/2014 0907   NITRITE NEGATIVE 02/16/2024 1840   LEUKOCYTESUR NEGATIVE 02/16/2024 1840   Sepsis Labs: @LABRCNTIP (procalcitonin:4,lacticidven:4) ) Recent Results (from the past 240 hours)  Urine Culture     Status: None   Collection Time: 02/13/24  5:05 PM   Specimen: Urine, Clean Catch  Result Value Ref Range Status   Specimen Description   Final    URINE, CLEAN CATCH Performed at [facility], [address], [place], [place] [address]    Special Requests   Final    NONE Performed at [facility], [address], [place], [place] [address]    Culture   Final    NO GROWTH Performed at [facility], [address], [place], [place] [address]    Report Status 02/15/2024 FINAL  Final  Blood culture (routine x 2)     Status: None (Preliminary result)   Collection Time: 02/16/24  5:20 PM   Specimen: BLOOD  Result Value Ref Range Status   Specimen Description   Final    BLOOD BLOOD RIGHT HAND Performed at [facility], [address] [place], [place] [address]    Special Requests   Final    BOTTLES DRAWN AEROBIC AND ANAEROBIC Blood Culture adequate volume Performed at [facility], [address], [place], [place] [address]    Culture   Final    NO GROWTH < 12 HOURS Performed at [facility], [address], [place], [place] [address]    Report Status PENDING  Incomplete  Resp  panel by RT-PCR (RSV, Flu A&B, Covid) Anterior Nasal Swab     Status: None   Collection Time: 02/16/24  5:20 PM   Specimen: Anterior Nasal Swab  Result Value Ref Range Status   SARS Coronavirus 2 by RT PCR NEGATIVE NEGATIVE Final    Comment: (NOTE) SARS-CoV-2 target nucleic acids are NOT DETECTED.  The SARS-CoV-2 RNA is generally detectable in upper respiratory specimens during the acute phase of infection. The lowest concentration of SARS-CoV-2  viral copies this assay can detect is 138 copies/mL. A negative result does not preclude SARS-Cov-2 infection and should not be used as the sole basis for treatment or other patient management decisions. A negative result may occur with  improper specimen collection/handling, submission of specimen other than nasopharyngeal swab, presence of viral mutation(s) within the areas targeted by this assay, and inadequate number of viral copies(<138 copies/mL). A negative result must be combined with clinical observations, patient history, and epidemiological information. The expected result is Negative.  Fact Sheet for Patients:  bloggercourse.com  Fact Sheet for Healthcare Providers:  seriousbroker.it  This test is no t yet approved or cleared by the [place]  FDA and  has been authorized for detection and/or diagnosis of SARS-CoV-2 by FDA under an Emergency Use Authorization (EUA). This EUA will remain  in effect (meaning this test can be used) for the duration of the COVID-19 declaration under Section 564(b)(1) of the Act, 21 U.S.C.section 360bbb-3(b)(1), unless the authorization is terminated  or revoked sooner.       Influenza A by PCR NEGATIVE NEGATIVE Final   Influenza B by PCR NEGATIVE NEGATIVE Final    Comment: (NOTE) The Xpert Xpress SARS-CoV-2/FLU/RSV plus assay is intended as an aid in the diagnosis of influenza from Nasopharyngeal swab specimens and should not be used as a  sole basis for treatment. Nasal washings and aspirates are unacceptable for Xpert Xpress SARS-CoV-2/FLU/RSV testing.  Fact Sheet for Patients: bloggercourse.com  Fact Sheet for Healthcare Providers: seriousbroker.it  This test is not yet approved or cleared by the [place]  FDA and has been authorized for detection and/or diagnosis of SARS-CoV-2 by FDA under an Emergency Use Authorization (EUA). This EUA will remain in effect (meaning this test can be used) for the duration of the COVID-19 declaration under Section 564(b)(1) of the Act, 21 U.S.C. section 360bbb-3(b)(1), unless the authorization is terminated or revoked.     Resp Syncytial Virus by PCR NEGATIVE NEGATIVE Final    Comment: (NOTE) Fact Sheet for Patients: bloggercourse.com  Fact Sheet for Healthcare Providers: seriousbroker.it  This test is not yet approved or cleared by the [place]  FDA and has been authorized for detection and/or diagnosis of SARS-CoV-2 by FDA under an Emergency Use Authorization (EUA). This EUA will remain in effect (meaning this test can be used) for the duration of the COVID-19 declaration under Section 564(b)(1) of the Act, 21 U.S.C. section 360bbb-3(b)(1), unless the authorization is terminated or revoked.  Performed at [facility], [address] [place], [place] [address]   Blood culture (routine x 2)     Status: None (Preliminary result)   Collection Time: 02/16/24  5:25 PM   Specimen: BLOOD  Result Value Ref Range Status   Specimen Description   Final    BLOOD BLOOD RIGHT FOREARM Performed at [facility], [address], [place], [place] [address]    Special Requests   Final    BOTTLES DRAWN AEROBIC AND ANAEROBIC Blood Culture adequate volume Performed at [facility], [address], [place], [place]  [address]    Culture   Final    NO GROWTH < 12 HOURS Performed at [facility], [address], [place], [place] [address]    Report Status PENDING  Incomplete     Scheduled Meds:  indocyanine green   1.25 mg Intravenous Once   [MAR Hold] metoprolol  succinate  100 mg Oral Daily   Continuous Infusions:  [MAR Hold] piperacillin -tazobactam (ZOSYN )  IV 3.375  g (02/17/24 0532)    Procedures/Studies: CT ABDOMEN PELVIS W CONTRAST Result Date: 02/16/2024 EXAM: CT ABDOMEN AND PELVIS WITH CONTRAST 02/16/2024 07:57:25 PM TECHNIQUE: CT of the abdomen and pelvis was performed with the administration of 100 mL of iohexol  (OMNIPAQUE ) 350 MG/ML injection. Multiplanar reformatted images are provided for review. Automated exposure control, iterative reconstruction, and/or weight-based adjustment of the mA/kV was utilized to reduce the radiation dose to as low as reasonably achievable. COMPARISON: None available. CLINICAL HISTORY: Epigastric pain; cp, epigastric abd pain, leukocytosis. FINDINGS: LOWER CHEST: No acute abnormality. LIVER: Hepatic steatosis. GALLBLADDER AND BILE DUCTS: Distended gallbladder with wall thickening and pericholecystic fluid compatible with acute cholecystitis. No radiopaque stone or biliary ductal dilatation. SPLEEN: No acute abnormality. PANCREAS: No acute abnormality. ADRENAL GLANDS: No acute abnormality. KIDNEYS, URETERS AND BLADDER: No stones in the kidneys or ureters. No hydronephrosis. No perinephric or periureteral stranding. Urinary bladder is unremarkable. GI AND BOWEL: Stomach demonstrates no acute abnormality. Colonic diverticulosis without evidence of diverticulitis. There is no bowel obstruction. PERITONEUM AND RETROPERITONEUM: No ascites. No free air. VASCULATURE: Aorta is normal in caliber. Aortic atherosclerotic calcification. LYMPH NODES: No lymphadenopathy. REPRODUCTIVE ORGANS: No acute abnormality. BONES AND SOFT TISSUES: No acute osseous abnormality. No focal soft  tissue abnormality. IMPRESSION: 1. Acute cholecystitis. Electronically signed by: Norman Gatlin MD 02/16/2024 08:10 PM EST RP Workstation: [id number]   CT Angio Chest PE W and/or Wo Contrast Result Date: 02/16/2024 EXAM: CTA CHEST 02/16/2024 07:57:25 PM TECHNIQUE: CTA of the chest was performed with the administration of 100 mL of iohexol  (OMNIPAQUE ) 350 MG/ML injection. Multiplanar reformatted images are provided for review. MIP images are provided for review. Automated exposure control, iterative reconstruction, and/or weight based adjustment of the mA/kV was utilized to reduce the radiation dose to as low as reasonably achievable. COMPARISON: Comparison with the same day x-ray. CLINICAL HISTORY: Pulmonary embolism (PE) suspected, high prob; cough, cp, abd pain. FINDINGS: PULMONARY ARTERIES: Pulmonary arteries are adequately opacified for evaluation. No acute pulmonary embolus. Main pulmonary artery is normal in caliber. MEDIASTINUM: Coronary artery and aortic atherosclerotic calcification. The heart and pericardium demonstrate no acute abnormality. There is no acute abnormality of the thoracic aorta. LYMPH NODES: No mediastinal, hilar or axillary lymphadenopathy. LUNGS AND PLEURA: Emphysema. Right lower lobe atelectasis. No evidence of pleural effusion or pneumothorax. UPPER ABDOMEN: Hepatic steatosis. SOFT TISSUES AND BONES: No acute bone or soft tissue abnormality. IMPRESSION: 1. No pulmonary embolism. 2. Emphysema. Pulmonary emphysema is an independent risk factor for lung cancer. Recommend consideration for evaluation for a low-dose CT lung cancer screening program. Electronically signed by: Norman Gatlin MD 02/16/2024 08:07 PM EST RP Workstation: [id number]   DG Chest Portable 1 View Result Date: 02/16/2024 CLINICAL DATA:  Cough. EXAM: PORTABLE CHEST 1 VIEW COMPARISON:  05/11/2021. FINDINGS: The heart size and mediastinal contours are within normal limits. There is elevation of the right diaphragm.  Atelectasis is noted at the lung bases bilaterally. No effusion or pneumothorax is seen. 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 IMPRESSION: Elevation of the right diaphragm with atelectasis at the lung bases. Electronically Signed   By: Leita Birmingham M.D.   On: 02/16/2024 17:48    Alm Schneider, DO  [facility]  If 7PM-7AM, please contact night-coverage [email or web address] Password [facility] 02/17/2024, 12:32 PM   LOS: 1 day   "

## 2024-02-17 NOTE — H&P (Signed)
 "    Nathan Stewart FMW:[id number] DOB: 11-Apr-1955 DOA: 02/16/2024     PCP: [name], MD   Outpatient Specialists: Infectious disease Dr. [facility] Dr. Dozier [facility] Toribio [name], MD  Patient arrived to ER on 02/16/24 at 1547 Referred by Attending [name], MD   Patient coming from:    home Lives  With family    Chief Complaint: Right upper quadrant/epigastric pain   HPI: Nathan Stewart is a 68 y.o. male with medical history significant of  GERD, HTN, MSSA infection over left shoulder sleep apnea type, both knees replaced    Presented with   abdominal pain Patient has been having intermittent right upper quadrant pain radiating to the back decreased appetite nausea no diarrhea patient started this epigastric but became more progressive He has history of infected left arthroplasty with MSSA on chronic antibiotics followed by infectious disease he has been on antibiotics Duricef but reports no diarrhea and his shoulders been doing well He has been seen for abdominal pain 3 days ago at urgent care was told he likely has a viral infection Given his fevers and fatigue he eventually presented to [facility] ER he was found to have sodium of 129 potassium down to 3.0 white blood cell count of 18 Viral panel negative EKG initially did show some ST changes but patient did not had any chest pain On the contrary CT abdomen pelvis did show evidence of acute cholecystitis General surgery was consulted Dr. Sebastian who will follow along patient started on Zosyn     Reports pain started suddenly 5 days ago, he had [employer] prior to that, no vomiting but some nausea, has had subject fevers and chills. For the past few days the pain has improved he just can't get comfortable , last BM was 4 days ago    Denies significant ETOH intake   Does not smoke     Regarding pertinent Chronic problems:       HTN on Norvasc , Toprol      OSA - noncompliant with CPAP      While in ER: Clinical Course as of 02/17/24 0008  Mon Feb 16, 2024  2200 Dr. Sebastian with gener surgery will consult [BH]  2220 Dr. Franky with medicine for [facility] [BH]    Clinical Course User Index [BH] Henderly, Britni A, PA-C       Lab Orders         Blood culture (routine x 2)         Resp panel by RT-PCR (RSV, Flu A&B, Covid) Anterior Nasal Swab         Lipase, blood         Comprehensive metabolic panel         CBC         Urinalysis, Routine w reflex microscopic -Urine, Clean Catch         Lactic acid, plasma        CXR -  NON acute atelectasis at the lung bases   CTabd/pelvis - Acute cholecystitis.   CTA chest - Emphysema. No PE  Following Medications were ordered in ER: Medications  sodium chloride  0.9 % bolus 1,000 mL (0 mLs Intravenous Stopped 02/16/24 2158)  potassium chloride  10 mEq in 100 mL IVPB (0 mEq Intravenous Stopped 02/16/24 2026)  iohexol  (OMNIPAQUE ) 350 MG/ML injection 100 mL (100 mLs Intravenous Contrast Given 02/16/24 1953)  piperacillin -tazobactam (ZOSYN ) IVPB 3.375 g (0 g Intravenous Stopped 02/16/24 2232)    _______________________________________________________ ER  Provider Called:     General surgery   Dr.Thompson They Recommend admit to medicine   Will see in AM      ED Triage Vitals  Encounter Vitals Group     BP 02/16/24 1556 113/69     Girls Systolic BP Percentile --      Girls Diastolic BP Percentile --      Boys Systolic BP Percentile --      Boys Diastolic BP Percentile --      Pulse Rate 02/16/24 1556 86     Resp 02/16/24 1556 16     Temp 02/16/24 1556 97.9 F (36.6 C)     Temp Source 02/16/24 1926 Oral     SpO2 02/16/24 1556 94 %     Weight --      Height --      Head Circumference --      Peak Flow --      Pain Score 02/16/24 1557 7     Pain Loc --      Pain Education --      Exclude from Growth Chart --   UFJK(75)@     _________________________________________ Significant initial  Findings: Abnormal  Labs Reviewed  COMPREHENSIVE METABOLIC PANEL WITH GFR - Abnormal; Notable for the following components:      Result Value   Sodium 129 (*)    Potassium 3.0 (*)    Chloride 87 (*)    Glucose, Bld 112 (*)    Calcium 10.8 (*)    All other components within normal limits  CBC - Abnormal; Notable for the following components:   WBC 18.0 (*)    HCT 38.6 (*)    All other components within normal limits  URINALYSIS, ROUTINE W REFLEX MICROSCOPIC - Abnormal; Notable for the following components:   Hgb urine dipstick SMALL (*)    All other components within normal limits      _________________________ Troponin <15 Cardiac Panel (last 3 results) No results for input(s): CKTOTAL, CKMB, TROPONINIHS, RELINDX in the last 72 hours.   ECG: Ordered Personally reviewed and interpreted by me showing: HR : 81 Rhythm: Normal sinus rhythm subtle anterior ST coving compared to first previous QTC 455   The recent clinical data is shown below. Vitals:   02/16/24 1556 02/16/24 1926 02/16/24 2320 02/16/24 2351  BP: 113/69 137/75 (!) 142/79 129/78  Pulse: 86 85 89 82  Resp: 16 (!) 24 (!) 22 16  Temp: 97.9 F (36.6 C) 98 F (36.7 C) 99.7 F (37.6 C) 98.8 F (37.1 C)  TempSrc:  Oral Oral Oral  SpO2: 94% 96% 97% 97%    WBC     Component Value Date/Time   WBC 18.0 (H) 02/16/2024 1720   LYMPHSABS 1.3 02/13/2024 1722   MONOABS 504 07/26/2016 0820   EOSABS 0.0 02/13/2024 1722   BASOSABS 0.1 02/13/2024 1722    Lactic Acid, Venous    Component Value Date/Time   LATICACIDVEN 0.9 02/16/2024 1720      UA   no evidence of UTI     Urine analysis:    Component Value Date/Time   COLORURINE YELLOW 02/16/2024 1840   APPEARANCEUR CLEAR 02/16/2024 1840   LABSPEC 1.007 02/16/2024 1840   PHURINE 7.0 02/16/2024 1840   GLUCOSEU NEGATIVE 02/16/2024 1840   HGBUR SMALL (A) 02/16/2024 1840   HGBUR trace-lysed 03/20/2010 0814   BILIRUBINUR NEGATIVE 02/16/2024 1840   BILIRUBINUR small (A)  02/13/2024 1641   BILIRUBINUR n 06/07/2013 1107   KETONESUR  NEGATIVE 02/16/2024 1840   PROTEINUR NEGATIVE 02/16/2024 1840   UROBILINOGEN 1.0 02/13/2024 1641   UROBILINOGEN 0.2 11/16/2014 0907   NITRITE NEGATIVE 02/16/2024 1840   LEUKOCYTESUR NEGATIVE 02/16/2024 1840    Results for orders placed or performed during the hospital encounter of 02/16/24  Resp panel by RT-PCR (RSV, Flu A&B, Covid) Anterior Nasal Swab     Status: None   Collection Time: 02/16/24  5:20 PM   Specimen: Anterior Nasal Swab  Result Value Ref Range Status   SARS Coronavirus 2 by RT PCR NEGATIVE NEGATIVE Final         Influenza A by PCR NEGATIVE NEGATIVE Final   Influenza B by PCR NEGATIVE NEGATIVE Final         Resp Syncytial Virus by PCR NEGATIVE NEGATIVE Final          ABX started Antibiotics Given (last 72 hours)     Date/Time Action Medication Dose Rate   02/16/24 2203 New Bag/Given   piperacillin -tazobactam (ZOSYN ) IVPB 3.375 g 3.375 g 100 mL/hr        __________________________________________________________ Recent Labs  Lab 02/13/24 1722 02/16/24 1720  NA 130* 129*  K 3.3* 3.0*  CO2 20 26  GLUCOSE 120* 112*  BUN 14 12  CREATININE 1.00 0.88  CALCIUM 9.8 10.8*    Cr    stable,    Lab Results  Component Value Date   CREATININE 0.88 02/16/2024   CREATININE 1.00 02/13/2024   CREATININE 0.76 11/07/2023    Recent Labs  Lab 02/13/24 1722 02/16/24 1720  AST 20 29  ALT 29 34  ALKPHOS 100 122  BILITOT 1.4* 1.1  PROT 7.4 8.0  ALBUMIN  4.7 4.1   Lab Results  Component Value Date   CALCIUM 10.8 (H) 02/16/2024     Plt: Lab Results  Component Value Date   PLT 273 02/16/2024       Recent Labs  Lab 02/13/24 1722 02/16/24 1720  WBC 24.4* 18.0*  NEUTROABS 21.5*  --   HGB 14.8 13.5  HCT 42.8 38.6*  MCV 88 85.0  PLT 234 273    HG/HCT   stable,      Component Value Date/Time   HGB 13.5 02/16/2024 1720   HGB 14.8 02/13/2024 1722   HCT 38.6 (L) 02/16/2024 1720   HCT 42.8  02/13/2024 1722   MCV 85.0 02/16/2024 1720   MCV 88 02/13/2024 1722     Recent Labs  Lab 02/13/24 1722 02/16/24 1720  LIPASE 16 30   No results for input(s): AMMONIA in the last 168 hours.    _______________________________________________ Hospitalist was called for admission for   Cholecystitis  Hypokalemia  Hyponatremia   Dehydration     The following Work up has been ordered so far:  Orders Placed This Encounter  Procedures   Blood culture (routine x 2)   Resp panel by RT-PCR (RSV, Flu A&B, Covid) Anterior Nasal Swab   DG Chest Portable 1 View   CT Angio Chest PE W and/or Wo Contrast   CT ABDOMEN PELVIS W CONTRAST   Lipase, blood   Comprehensive metabolic panel   CBC   Urinalysis, Routine w reflex microscopic -Urine, Clean Catch   Lactic acid, plasma   Diet NPO time specified   Consult to general surgery   Consult to hospitalist   ED EKG   EKG   Insert peripheral IV   Admit to Inpatient (patient's expected length of stay will be greater than 2 midnights or inpatient only  procedure)     OTHER Significant initial  Findings:  labs showing:     DM  labs:  HbA1C: Recent Labs    11/11/23 0903  HGBA1C 5.8*       CBG (last 3)  No results for input(s): GLUCAP in the last 72 hours.        Cultures:    Component Value Date/Time   SDES  02/13/2024 1705    URINE, CLEAN CATCH Performed at [facility], [address] [place], [place] [address]    [facility]  02/13/2024 1705    NONE Performed at [facility], [address] [place], [place] [address]    CULT  02/13/2024 1705    NO GROWTH Performed at [facility], [address] [place], [place] [address]    REPTSTATUS 02/15/2024 FINAL 02/13/2024 1705     Radiological Exams on Admission: CT ABDOMEN PELVIS W CONTRAST Result Date: 02/16/2024 EXAM: CT ABDOMEN AND PELVIS WITH CONTRAST 02/16/2024 07:57:25 PM TECHNIQUE: CT of the abdomen and pelvis was performed with the administration of  100 mL of iohexol  (OMNIPAQUE ) 350 MG/ML injection. Multiplanar reformatted images are provided for review. Automated exposure control, iterative reconstruction, and/or weight-based adjustment of the mA/kV was utilized to reduce the radiation dose to as low as reasonably achievable. COMPARISON: None available. CLINICAL HISTORY: Epigastric pain; cp, epigastric abd pain, leukocytosis. FINDINGS: LOWER CHEST: No acute abnormality. LIVER: Hepatic steatosis. GALLBLADDER AND BILE DUCTS: Distended gallbladder with wall thickening and pericholecystic fluid compatible with acute cholecystitis. No radiopaque stone or biliary ductal dilatation. SPLEEN: No acute abnormality. PANCREAS: No acute abnormality. ADRENAL GLANDS: No acute abnormality. KIDNEYS, URETERS AND BLADDER: No stones in the kidneys or ureters. No hydronephrosis. No perinephric or periureteral stranding. Urinary bladder is unremarkable. GI AND BOWEL: Stomach demonstrates no acute abnormality. Colonic diverticulosis without evidence of diverticulitis. There is no bowel obstruction. PERITONEUM AND RETROPERITONEUM: No ascites. No free air. VASCULATURE: Aorta is normal in caliber. Aortic atherosclerotic calcification. LYMPH NODES: No lymphadenopathy. REPRODUCTIVE ORGANS: No acute abnormality. BONES AND SOFT TISSUES: No acute osseous abnormality. No focal soft tissue abnormality. IMPRESSION: 1. Acute cholecystitis. Electronically signed by: Norman Gatlin MD 02/16/2024 08:10 PM EST RP Workstation: [id number]   CT Angio Chest PE W and/or Wo Contrast Result Date: 02/16/2024 EXAM: CTA CHEST 02/16/2024 07:57:25 PM TECHNIQUE: CTA of the chest was performed with the administration of 100 mL of iohexol  (OMNIPAQUE ) 350 MG/ML injection. Multiplanar reformatted images are provided for review. MIP images are provided for review. Automated exposure control, iterative reconstruction, and/or weight based adjustment of the mA/kV was utilized to reduce the radiation dose to as low  as reasonably achievable. COMPARISON: Comparison with the same day x-ray. CLINICAL HISTORY: Pulmonary embolism (PE) suspected, high prob; cough, cp, abd pain. FINDINGS: PULMONARY ARTERIES: Pulmonary arteries are adequately opacified for evaluation. No acute pulmonary embolus. Main pulmonary artery is normal in caliber. MEDIASTINUM: Coronary artery and aortic atherosclerotic calcification. The heart and pericardium demonstrate no acute abnormality. There is no acute abnormality of the thoracic aorta. LYMPH NODES: No mediastinal, hilar or axillary lymphadenopathy. LUNGS AND PLEURA: Emphysema. Right lower lobe atelectasis. No evidence of pleural effusion or pneumothorax. UPPER ABDOMEN: Hepatic steatosis. SOFT TISSUES AND BONES: No acute bone or soft tissue abnormality. IMPRESSION: 1. No pulmonary embolism. 2. Emphysema. Pulmonary emphysema is an independent risk factor for lung cancer. Recommend consideration for evaluation for a low-dose CT lung cancer screening program. Electronically signed by: Norman Gatlin MD 02/16/2024 08:07 PM EST RP Workstation: [id number]  DG Chest Portable 1 View Result Date: 02/16/2024 CLINICAL DATA:  Cough. EXAM: PORTABLE CHEST 1 VIEW COMPARISON:  05/11/2021. FINDINGS: The heart size and mediastinal contours are within normal limits. There is elevation of the right diaphragm. Atelectasis is noted at the lung bases bilaterally. No effusion or pneumothorax is seen. 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 IMPRESSION: Elevation of the right diaphragm with atelectasis at the lung bases. Electronically Signed   By: Leita Birmingham M.D.   On: 02/16/2024 17:48   _______________________________________________________________________________________________________ Latest  Blood pressure 129/78, pulse 82, temperature 98.8 F (37.1 C), temperature source Oral, resp. rate 16, SpO2 97%.   Vitals  labs and radiology finding personally reviewed  Review of Systems:    Pertinent  positives include:  Fevers, chills, fatigue,  abdominal pain, nausea Constitutional:  No weight loss, night sweats,  weight loss  HEENT:  No headaches, Difficulty swallowing,Tooth/dental problems,Sore throat,  No sneezing, itching, ear ache, nasal congestion, post nasal drip,  Cardio-vascular:  No chest pain, Orthopnea, PND, anasarca, dizziness, palpitations.no Bilateral lower extremity swelling  GI:  No heartburn, indigestion,, vomiting, diarrhea, change in bowel habits, loss of appetite, melena, blood in stool, hematemesis Resp:  no shortness of breath at rest. No dyspnea on exertion, No excess mucus, no productive cough, No non-productive cough, No coughing up of blood.No change in color of mucus.No wheezing. Skin:  no rash or lesions. No jaundice GU:  no dysuria, change in color of urine, no urgency or frequency. No straining to urinate.  No flank pain.  Musculoskeletal:  No joint pain or no joint swelling. No decreased range of motion. No back pain.  Psych:  No change in mood or affect. No depression or anxiety. No memory loss.  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   GEN OSTEOARTHROSIS INVOLVING MULTIPLE SITES 11/10/2007   GERD 01/01/2007   Hx of adenomatous polyp of colon 04/04/2016   Hypertension    INSOMNIA, CHRONIC 11/13/2009   LIVER FUNCTION TESTS, ABNORMAL 08/27/2007   LOC OSTEOARTHROS NOT SPEC PRIM/SEC LOWER LEG 11/10/2007   Memory loss 08/27/2007   MENISCUS TEAR 10/30/2006   MSSA  (methicillin susceptible Staphylococcus aureus) infection 04/29/2023   OSTEOARTHRITIS 06/07/2008   Pneumonia    Septic arthritis of shoulder, left (HCC) 09/16/2023   SLEEP APNEA 09/20/2009   no cpap   TESTICULAR HYPOFUNCTION 08/14/2009     Past Surgical History:  Procedure Laterality Date   COLONOSCOPY     COLONOSCOPY WITH PROPOFOL  N/A 05/09/2023   Procedure: COLONOSCOPY WITH PROPOFOL ;  Surgeon: Eartha Angelia Sieving, MD;  Location: [facility];  Service: Gastroenterology;  Laterality: N/A;  10:45AM;ASA 1   HAND SURGERY  over 20 years ago   'metal plate in wrist'   IRRIGATION AND DEBRIDEMENT SHOULDER Left 04/03/2023   Procedure: IRRIGATION AND DEBRIDEMENT SHOULDER;  Surgeon: Dozier Soulier, MD;  Location: [facility];  Service: Orthopedics;  Laterality: Left;   JOINT REPLACEMENT N/A    Phreesia 10/19/2019   MULTIPLE TOOTH EXTRACTIONS     POLYPECTOMY  05/09/2023   Procedure: POLYPECTOMY;  Surgeon: Eartha Angelia Sieving, MD;  Location: [facility];  Service: Gastroenterology;;   CATHY  SURGERY Right 2021   ligament damage   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   Procedure: TOTAL KNEE ARTHROPLASTY;  Surgeon: Dempsey Sensor, MD;  Location: [facility];  Service: Orthopedics;  Laterality: Left;   TOTAL KNEE ARTHROPLASTY Right 05/21/2021   Procedure: RIGHT TOTAL KNEE ARTHROPLASTY;  Surgeon: Sensor Dempsey, MD;  Location: [facility];  Service: Orthopedics;  Laterality: Right;   TOTAL SHOULDER REVISION Left 03/11/2023   Procedure: LEFT SHOULDER REVISION GLENOID COMPONENT;  Surgeon: Dozier Soulier, MD;  Location: [facility];  Service: Orthopedics;  Laterality: Left;   TOTAL SHOULDER REVISION Left 04/03/2023   Procedure: SHOULDER REVISION;  Surgeon: Dozier Soulier, MD;  Location: [facility];  Service: Orthopedics;  Laterality: Left;    Social History:  Ambulatory   independently      reports that he quit smoking about 26 years ago. His smoking use included cigarettes. He has been exposed to tobacco  smoke. He has never used smokeless tobacco. He reports current alcohol use of about 6.0 standard drinks of alcohol per week. He reports that he does not use drugs.     Family History:   Family History  Problem Relation Age of Onset   Arthritis Mother    Heart disease Mother    Hypertension Mother    Hypertension Father    Stroke Father    Hypertension Sister    Stroke Brother    Colon cancer Paternal Uncle    ______________________________________________________________________________________________ Allergies: Allergies[1]   Prior to Admission medications  Medication Sig Start Date End Date Taking? Authorizing Provider  ALPRAZolam  (XANAX ) 0.5 MG tablet Take 1 tablet (0.5 mg total) by mouth 3 (three) times daily as needed for anxiety. 11/11/23   [name], MD  amLODipine  (NORVASC ) 5 MG tablet TAKE 1 TABLET(5 MG) BY MOUTH DAILY 12/15/23   [name], MD  cefadroxil  (DURICEF) 500 MG capsule Take 2 capsules (1,000 mg total) by mouth 2 (two) times daily. 04/30/23   Fleeta Kathie Jomarie LOISE, MD  ELDERBERRY PO Take 1 tablet by mouth daily.    [provider]  Garlic 1000 MG CAPS Take 1,000 mg by mouth daily.    [provider]  hydrochlorothiazide  (HYDRODIURIL ) 25 MG tablet TAKE 1 TABLET(25 MG) BY MOUTH DAILY 09/18/23   [name], MD  Magnesium 250 MG TABS Take 250 mg by mouth daily.    [provider]  metoprolol  succinate (TOPROL -XL) 100 MG 24 hr tablet TAKE 1 TABLET BY MOUTH EVERY DAY WITH OR IMMEDIATELY FOLLOWING A MEAL 12/16/23   [name], MD  Multiple Vitamin (MULTIVITAMIN) tablet Take 1 tablet by mouth daily. Adult 50+    [provider]  Omega-3 Fatty Acids (FISH OIL) 1000 MG CPDR Take 2,000 mg by mouth daily.    [provider]  vitamin C (ASCORBIC ACID) 250 MG tablet Take 250 mg by mouth daily.    [provider]     ___________________________________________________________________________________________________ Physical Exam:    02/16/2024   11:51 PM 02/16/2024   11:20 PM 02/16/2024    7:26 PM  Vitals with BMI  Systolic 129 142 862  Diastolic 78 79 75  Pulse 82 89 85     1. General:  in No  Acute distress    Chronically ill   -appearing 2. Psychological: Alert and   Oriented 3. Head/ENT:  Dry Mucous Membranes  Head Non traumatic, neck supple                          Poor Dentition 4. SKIN:  decreased Skin turgor,  Skin clean Dry and intact no rash  5. Heart: Regular rate and rhythm no  Murmur, no Rub or gallop 6. Lungs:  no wheezes or crackles   7. Abdomen: Soft,  non-tender, Non distended   obese  bowel sounds present 8. Lower extremities: no clubbing, cyanosis, no  edema 9. Neurologically Grossly intact, moving all 4 extremities equally   10. MSK: Normal range of motion    Chart has been reviewed  ______________________________________________________________________________________________  Assessment/Plan 68 y.o. 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 intake and dehydration will rehydrate obtain urine electrolytes  follow sodium levels  Dehydration Rehydrate and follow sodium level check orthostatics prior to discharge   Other plan as per orders.  DVT prophylaxis:  SCD     Code Status:    Code Status: Prior FULL CODE   as per patient   I had personally discussed CODE STATUS with patient   ACP   none    Family Communication:   Family not at  Bedside    Diet  Diet Orders (From admission, onward)     Start     Ordered   02/16/24 1558  Diet NPO time specified  Diet effective now        02/16/24 1558            Disposition Plan:      To home once workup is complete and patient is stable   Following barriers for discharge:                                                        Electrolytes corrected                                                            Pain controlled with PO medications                               Afebrile, white count improving able to transition to PO antibiotics                             Will need  to be able to tolerate PO                                                        Will need consultants to evaluate patient prior to discharge                             Consult Orders  (From admission, onward)           Start     Ordered   02/16/24 2132  Consult to hospitalist  Paged by Lyle, spoke to Tinnie  Once       Provider:  (Not yet assigned)  Question Answer Comment  Place call to: [facility]   Reason for Consult Admit      02/16/24 2131            Consults called: General Surgery is aware   Admission status:  ED Disposition     ED Disposition  Admit   Condition  --   Comment  Hospital Area: [facility] [[id number]]  Level of Care: Med-Surg [16]  May admit patient to [facility] or [facility] if equivalent level of care is available:: Yes  Interfacility transfer: Yes  Diagnosis: Acute cholecystitis [575.0.ICD-9-CM]  Admitting Physician: KAKRAKANDY, ARSHAD N [[id number]]  Attending Physician: [name] [phone]  Certification:: I certify this patient will need inpatient services for at least 2 midnights  Expected Medical Readiness: 02/18/2024              inpatient     I Expect 2 midnight stay secondary to severity of patient's current illness need for inpatient interventions justified by the following: Severe lab/radiological/exam abnormalities including:    Cholecystitis, Hypokalemia, Hyponatremia, Dehydration    That are currently affecting medical management.   I expect  patient to be hospitalized for 2 midnights requiring inpatient medical care.  Patient is at high risk for adverse outcome (such as loss of life or disability) if not treated.  Indication for inpatient stay as follows:   severe pain requiring acute inpatient management,  inability to maintain oral hydration   Need for operative/procedural  intervention    Need for IV antibiotics, IV fluids,, IV pain medications     Level of care       medical floor      Blease Quiver 02/17/2024, 12:59 AM    [facility]     after 2 AM please page floor coverage   If 7AM-7PM, please contact the day team taking care of the patient using [email or web address]        [1]  Allergies Allergen Reactions   Hydrocodone -Acetaminophen  Nausea And Vomiting   Irbesartan      Tingling, swelling  Pt unaware of allergy reaction    Percocet [Oxycodone -Acetaminophen ] Nausea And Vomiting   Shingrix [Zoster Vac Recomb Adjuvanted] Itching   "

## 2024-02-17 NOTE — Assessment & Plan Note (Signed)
 Not on CPAP

## 2024-02-17 NOTE — Transfer of Care (Signed)
 Immediate Anesthesia Transfer of Care Note  Patient: Nathan Stewart  Procedure(s) Performed: LAPAROSCOPIC CHOLECYSTECTOMY  Patient Location: PACU  Anesthesia Type:General  Level of Consciousness: awake, alert , and oriented  Airway & Oxygen Therapy: Patient Spontanous Breathing and Patient connected to face mask oxygen  Post-op Assessment: Report given to RN, Post -op Vital signs reviewed and stable, and Patient moving all extremities X 4  Post vital signs: Reviewed and stable  Last Vitals:  Vitals Value Taken Time  BP 147/74 02/17/24 16:00  Temp    Pulse 87 02/17/24 16:02  Resp 24 02/17/24 16:02  SpO2 97 % 02/17/24 16:02  Vitals shown include unfiled device data.  Last Pain:  Vitals:   02/17/24 1229  TempSrc: Oral  PainSc: 0-No pain      Patients Stated Pain Goal: 5 (02/17/24 1229)  Complications: No notable events documented.

## 2024-02-17 NOTE — Anesthesia Preprocedure Evaluation (Addendum)
"                                    Anesthesia Evaluation  Patient identified by MRN, date of birth, ID band Patient awake    Reviewed: Allergy & Precautions, NPO status , Patient's Chart, lab work & pertinent test results  History of Anesthesia Complications Negative for: history of anesthetic complications  Airway Mallampati: II  TM Distance: >3 FB Neck ROM: Full    Dental  (+) Dental Advisory Given, Teeth Intact   Pulmonary sleep apnea (noncomplaint) , former smoker   Pulmonary exam normal        Cardiovascular hypertension, Pt. on medications and Pt. on home beta blockers Normal cardiovascular exam     Neuro/Psych  Prev cervical fusion   negative psych ROS   GI/Hepatic Neg liver ROS,GERD  Controlled,,  Endo/Other   Na 131 K 3.3   Renal/GU negative Renal ROS     Musculoskeletal  (+) Arthritis ,    Abdominal   Peds  Hematology  (+) Blood dyscrasia, anemia   Anesthesia Other Findings   Reproductive/Obstetrics                              Anesthesia Physical Anesthesia Plan  ASA: 2  Anesthesia Plan: General   Post-op Pain Management: Ofirmev  IV (intra-op)*   Induction: Intravenous  PONV Risk Score and Plan: 2 and Treatment may vary due to age or medical condition, Ondansetron  and Dexamethasone   Airway Management Planned: Oral ETT  Additional Equipment: None  Intra-op Plan:   Post-operative Plan: Extubation in OR  Informed Consent: I have reviewed the patients History and Physical, chart, labs and discussed the procedure including the risks, benefits and alternatives for the proposed anesthesia with the patient or authorized representative who has indicated his/her understanding and acceptance.     Dental advisory given  Plan Discussed with: CRNA and Anesthesiologist  Anesthesia Plan Comments:          Anesthesia Quick Evaluation  "

## 2024-02-17 NOTE — Plan of Care (Signed)
   Problem: Education: Goal: Knowledge of General Education information will improve Description Including pain rating scale, medication(s)/side effects and non-pharmacologic comfort measures Outcome: Progressing   Problem: Health Behavior/Discharge Planning: Goal: Ability to manage health-related needs will improve Outcome: Progressing   Problem: Clinical Measurements: Goal: Ability to maintain clinical measurements within normal limits will improve Outcome: Progressing Goal: Will remain free from infection Outcome: Progressing Goal: Diagnostic test results will improve Outcome: Progressing Goal: Respiratory complications will improve Outcome: Progressing Goal: Cardiovascular complication will be avoided Outcome: Progressing   Problem: Coping: Goal: Level of anxiety will decrease Outcome: Progressing   Problem: Nutrition: Goal: Adequate nutrition will be maintained Outcome: Progressing   Problem: Elimination: Goal: Will not experience complications related to bowel motility Outcome: Progressing Goal: Will not experience complications related to urinary retention Outcome: Progressing

## 2024-02-17 NOTE — Progress Notes (Signed)
" °   02/17/24 1123  TOC Brief Assessment  Insurance and Status Reviewed  Patient has primary care physician Yes  Home environment has been reviewed resides in a private residence  Prior level of function: Independent  Prior/Current Home Services No current home services  Social Drivers of Health Review SDOH reviewed no interventions necessary  Readmission risk has been reviewed Yes  Transition of care needs no transition of care needs at this time    "

## 2024-02-17 NOTE — Consult Note (Signed)
 "    CASON LUFFMAN 1955-06-08  [id number].    Requesting MD: Dr. 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  He was in his usual state of health and ate his banana for breakfast and took his pills last Wednesday.  He then stood up and developed RUQ abdominal pain.  He states the pain resolved, but for the last week he has been having nausea and unable to eat.  He has been drinking though and able to keep it down.  He last had a BM last Wednesday and none since then.  He was sent to the UC for evaluation and noted to have a WBC of 24K on Sunday.  He was then referred to the ED for evaluation.  He was found to have acute cholecystitis (no stones seen on CT) on a CT scan.  His WBC is down to 16K today on Zosyn .  His alk phos is elevated at 261, other LFTs are normal today, but TB was 1.4 4 days ago.  Alk phos was normal 4 days ago.  He has been admitted and we have been asked to see him for these findings.  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   GEN OSTEOARTHROSIS INVOLVING MULTIPLE SITES 11/10/2007   GERD 01/01/2007   Hx of adenomatous polyp of colon 04/04/2016   Hypertension    INSOMNIA, CHRONIC 11/13/2009   LIVER FUNCTION TESTS, ABNORMAL 08/27/2007   LOC OSTEOARTHROS NOT SPEC PRIM/SEC LOWER LEG 11/10/2007   Memory loss 08/27/2007   MENISCUS TEAR 10/30/2006   MSSA (methicillin susceptible  Staphylococcus aureus) infection 04/29/2023   OSTEOARTHRITIS 06/07/2008   Pneumonia    Septic arthritis of shoulder, left (HCC) 09/16/2023   SLEEP APNEA 09/20/2009   no cpap   TESTICULAR HYPOFUNCTION 08/14/2009    Past Surgical History:  Procedure Laterality Date   COLONOSCOPY     COLONOSCOPY WITH PROPOFOL  N/A 05/09/2023   Procedure: COLONOSCOPY WITH PROPOFOL ;  Surgeon: Eartha Angelia Sieving, MD;  [facility];  Service: Gastroenterology;  Laterality: N/A;  10:45AM;ASA 1   HAND SURGERY  over 20 years ago   'metal plate in wrist'   IRRIGATION AND DEBRIDEMENT SHOULDER Left 04/03/2023   Procedure: IRRIGATION AND DEBRIDEMENT SHOULDER;  Surgeon: Dozier Soulier, MD;  Location: [facility];  Service: Orthopedics;  Laterality: Left;   JOINT REPLACEMENT N/A    Phreesia 10/19/2019   MULTIPLE TOOTH EXTRACTIONS     POLYPECTOMY  05/09/2023   Procedure: POLYPECTOMY;  Surgeon: Eartha Angelia Sieving, MD;  [facility]: [facility];  Service: Gastroenterology;;   SHOULDER SURGERY Right 2021   ligament damage   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   Procedure: TOTAL KNEE ARTHROPLASTY;  Surgeon: Dempsey Sensor, MD;  [facility];  Service: Orthopedics;  Laterality: Left;   TOTAL KNEE ARTHROPLASTY Right 05/21/2021  Procedure: RIGHT TOTAL KNEE ARTHROPLASTY;  Surgeon: Liam Lerner, MD;  Location: [facility];  Service: Orthopedics;  Laterality: Right;   TOTAL SHOULDER REVISION Left 03/11/2023   Procedure: LEFT SHOULDER REVISION GLENOID COMPONENT;  Surgeon: Dozier Soulier, MD;  Location: [facility];  Service: Orthopedics;  Laterality: Left;   TOTAL SHOULDER REVISION Left 04/03/2023   Procedure: SHOULDER REVISION;  Surgeon: Dozier Soulier, MD;  Location: [facility];  Service: Orthopedics;  Laterality: Left;    Social History:  reports that he quit smoking about 26 years ago. His smoking use included cigarettes. He has been exposed to tobacco smoke. He has never used smokeless tobacco. He reports  current alcohol use of about 6.0 standard drinks of alcohol per week. He reports that he does not use drugs.  Allergies: Allergies[1]  Facility-Administered Medications Prior to Admission  Medication Dose Route Frequency Provider Last Rate Last Admin   0.9 %  sodium chloride  infusion  500 mL Intravenous Continuous Avram Lupita BRAVO, MD       Medications Prior to Admission  Medication Sig Dispense Refill   ALPRAZolam  (XANAX ) 0.5 MG tablet Take 1 tablet (0.5 mg total) by mouth 3 (three) times daily as needed for anxiety. 30 tablet 2   amLODipine  (NORVASC ) 5 MG tablet TAKE 1 TABLET(5 MG) BY MOUTH DAILY 90 tablet 3   cefadroxil  (DURICEF) 500 MG capsule Take 2 capsules (1,000 mg total) by mouth 2 (two) times daily. 120 capsule 9   ELDERBERRY PO Take 1 tablet by mouth daily.     Garlic 1000 MG CAPS Take 1,000 mg by mouth daily.     hydrochlorothiazide  (HYDRODIURIL ) 25 MG tablet TAKE 1 TABLET(25 MG) BY MOUTH DAILY (Patient taking differently: Take 25 mg by mouth daily.) 90 tablet 1   Magnesium 250 MG TABS Take 250 mg by mouth daily.     metoprolol  succinate (TOPROL -XL) 100 MG 24 hr tablet TAKE 1 TABLET BY MOUTH EVERY DAY WITH OR IMMEDIATELY FOLLOWING A MEAL 90 tablet 1   Multiple Vitamin (MULTIVITAMIN) tablet Take 1 tablet by mouth daily. Adult 50+     Omega-3 Fatty Acids (FISH OIL) 1000 MG CPDR Take 2,000 mg by mouth daily.     vitamin C (ASCORBIC ACID) 250 MG tablet Take 250 mg by mouth daily.       Physical Exam: Blood pressure (!) 142/72, pulse 84, temperature 98.7 F (37.1 C), resp. rate 18, height 6' 2 (1.88 m), weight 103 kg, SpO2 98%. General: pleasant, WD, WN black male who is laying in bed in NAD HEENT: head is normocephalic, atraumatic.  Sclera are noninjected.  PERRL.  Ears and nose without any masses or lesions.  Mouth is pink and moist Heart: regular, rate, and rhythm.  Normal s1,s2. No obvious murmurs, gallops, or rubs noted.   Lungs: CTAB, no wheezes, rhonchi, or rales noted.   Respiratory effort nonlabored Abd: soft, NT, ND, +BS, no masses or organomegaly.  Very tiny umbilical hernia with nothing present currently Psych: A&Ox3 with an appropriate affect.   Results for orders placed or performed during the hospital encounter of 02/16/24 (from the past 48 hours)  Lipase, blood     Status: None   Collection Time: 02/16/24  5:20 PM  Result Value Ref Range   Lipase 30 11 - 51 U/L    Comment: Performed at [facility], [address] [place], [place] [address]  Comprehensive metabolic panel     Status: Abnormal   Collection Time: 02/16/24  5:20 PM  Result  Value Ref Range   Sodium 129 (L) 135 - 145 mmol/L   Potassium 3.0 (L) 3.5 - 5.1 mmol/L   Chloride 87 (L) 98 - 111 mmol/L   CO2 26 22 - 32 mmol/L   Glucose, Bld 112 (H) 70 - 99 mg/dL    Comment: Glucose reference range applies only to samples taken after fasting for at least 8 hours.   BUN 12 8 - 23 mg/dL   Creatinine, Ser 9.11 0.61 - 1.24 mg/dL   Calcium 89.1 (H) 8.9 - 10.3 mg/dL   Total Protein 8.0 6.5 - 8.1 g/dL   Albumin  4.1 3.5 - 5.0 g/dL   AST 29 15 - 41 U/L   ALT 34 0 - 44 U/L   Alkaline Phosphatase 122 38 - 126 U/L   Total Bilirubin 1.1 0.0 - 1.2 mg/dL   GFR, Estimated >39 >39 mL/min    Comment: (NOTE) Calculated using the CKD-EPI Creatinine Equation (2021)    Anion gap 15 5 - 15    Comment: Performed at [facility], [address], [place], [place] [address]  CBC     Status: Abnormal   Collection Time: 02/16/24  5:20 PM  Result Value Ref Range   WBC 18.0 (H) 4.0 - 10.5 K/uL   RBC 4.54 4.22 - 5.81 MIL/uL   Hemoglobin 13.5 13.0 - 17.0 g/dL   HCT 61.3 (L) 60.9 - 47.9 %   MCV 85.0 80.0 - 100.0 fL   MCH 29.7 26.0 - 34.0 pg   MCHC 35.0 30.0 - 36.0 g/dL   RDW 88.0 88.4 - 84.4 %   Platelets 273 150 - 400 K/uL   nRBC 0.0 0.0 - 0.2 %    Comment: Performed at [facility], [address] [place], [place] [address]  Lactic acid, plasma      Status: None   Collection Time: 02/16/24  5:20 PM  Result Value Ref Range   Lactic Acid, Venous 0.9 0.5 - 1.9 mmol/L    Comment: Performed at [facility], [address], [place], [place] [address]  Blood culture (routine x 2)     Status: None (Preliminary result)   Collection Time: 02/16/24  5:20 PM   Specimen: BLOOD  Result Value Ref Range   Specimen Description      BLOOD BLOOD RIGHT HAND Performed at [facility], [address], [place], [place] [address]    Special Requests      BOTTLES DRAWN AEROBIC AND ANAEROBIC Blood Culture adequate volume Performed at [facility], [address] [place], [place] [address]    Culture      NO GROWTH < 12 HOURS Performed at [facility], [address], [place], [place] [address]    Report Status PENDING   Troponin T, High Sensitivity     Status: None   Collection Time: 02/16/24  5:20 PM  Result Value Ref Range   Troponin T High Sensitivity <15 0 - 19 ng/L    Comment: (NOTE) Biotin concentrations > 1000 ng/mL falsely decrease TnT results.  Serial cardiac troponin measurements are suggested.  Refer to the Links section for chest pain algorithms and additional  guidance. Performed at [facility], [address] [place], [place] [address]   Resp panel by RT-PCR (RSV, Flu A&B, Covid) Anterior Nasal Swab     Status: None   Collection Time: 02/16/24  5:20 PM   Specimen: Anterior Nasal Swab  Result Value Ref Range  SARS Coronavirus 2 by RT PCR NEGATIVE NEGATIVE    Comment: (NOTE) SARS-CoV-2 target nucleic acids are NOT DETECTED.  The SARS-CoV-2 RNA is generally detectable in upper respiratory specimens during the acute phase of infection. The lowest concentration of SARS-CoV-2 viral copies this assay can detect is 138 copies/mL. A negative result does not preclude SARS-Cov-2 infection and should not be used as the sole basis for treatment  or other patient management decisions. A negative result may occur with  improper specimen collection/handling, submission of specimen other than nasopharyngeal swab, presence of viral mutation(s) within the areas targeted by this assay, and inadequate number of viral copies(<138 copies/mL). A negative result must be combined with clinical observations, patient history, and epidemiological information. The expected result is Negative.  Fact Sheet for Patients:  bloggercourse.com  Fact Sheet for Healthcare Providers:  seriousbroker.it  This test is no t yet approved or cleared by the [place]  FDA and  has been authorized for detection and/or diagnosis of SARS-CoV-2 by FDA under an Emergency Use Authorization (EUA). This EUA will remain  in effect (meaning this test can be used) for the duration of the COVID-19 declaration under Section 564(b)(1) of the Act, 21 U.S.C.section 360bbb-3(b)(1), unless the authorization is terminated  or revoked sooner.       Influenza A by PCR NEGATIVE NEGATIVE   Influenza B by PCR NEGATIVE NEGATIVE    Comment: (NOTE) The Xpert Xpress SARS-CoV-2/FLU/RSV plus assay is intended as an aid in the diagnosis of influenza from Nasopharyngeal swab specimens and should not be used as a sole basis for treatment. Nasal washings and aspirates are unacceptable for Xpert Xpress SARS-CoV-2/FLU/RSV testing.  Fact Sheet for Patients: bloggercourse.com  Fact Sheet for Healthcare Providers: seriousbroker.it  This test is not yet approved or cleared by the [place]  FDA and has been authorized for detection and/or diagnosis of SARS-CoV-2 by FDA under an Emergency Use Authorization (EUA). This EUA will remain in effect (meaning this test can be used) for the duration of the COVID-19 declaration under Section 564(b)(1) of the Act, 21 U.S.C. section  360bbb-3(b)(1), unless the authorization is terminated or revoked.     Resp Syncytial Virus by PCR NEGATIVE NEGATIVE    Comment: (NOTE) Fact Sheet for Patients: bloggercourse.com  Fact Sheet for Healthcare Providers: seriousbroker.it  This test is not yet approved or cleared by the [place]  FDA and has been authorized for detection and/or diagnosis of SARS-CoV-2 by FDA under an Emergency Use Authorization (EUA). This EUA will remain in effect (meaning this test can be used) for the duration of the COVID-19 declaration under Section 564(b)(1) of the Act, 21 U.S.C. section 360bbb-3(b)(1), unless the authorization is terminated or revoked.  Performed at [facility], [address] [place], [place] [address]   Blood culture (routine x 2)     Status: None (Preliminary result)   Collection Time: 02/16/24  5:25 PM   Specimen: BLOOD  Result Value Ref Range   Specimen Description      BLOOD BLOOD RIGHT FOREARM Performed at [facility], [address] [place], [place] [address]    Special Requests      BOTTLES DRAWN AEROBIC AND ANAEROBIC Blood Culture adequate volume Performed at [facility], [address], [place], [place] [address]    Culture      NO GROWTH < 12 HOURS Performed at [facility], [address], [place], [place] [address]    Report Status PENDING   Urinalysis, Routine  w reflex microscopic -Urine, Clean Catch     Status: Abnormal   Collection Time: 02/16/24  6:40 PM  Result Value Ref Range   Color, Urine YELLOW YELLOW   APPearance CLEAR CLEAR   Specific Gravity, Urine 1.007 1.005 - 1.030   pH 7.0 5.0 - 8.0   Glucose, UA NEGATIVE NEGATIVE mg/dL   Hgb urine dipstick SMALL (A) NEGATIVE   Bilirubin Urine NEGATIVE NEGATIVE   Ketones, ur NEGATIVE NEGATIVE mg/dL   Protein, ur NEGATIVE NEGATIVE mg/dL   Nitrite NEGATIVE NEGATIVE    Leukocytes,Ua NEGATIVE NEGATIVE   RBC / HPF 0-5 0 - 5 RBC/hpf   WBC, UA 0-5 0 - 5 WBC/hpf   Bacteria, UA NONE SEEN NONE SEEN   Squamous Epithelial / HPF 0-5 0 - 5 /HPF   Mucus PRESENT     Comment: Performed at [facility], [address] [place], [place] [address]  Lactic acid, plasma     Status: None   Collection Time: 02/17/24 12:42 AM  Result Value Ref Range   Lactic Acid, Venous 0.8 0.5 - 1.9 mmol/L    Comment: Performed at [facility], [address], [place], [place] [address]  Osmolality     Status: Abnormal   Collection Time: 02/17/24 12:42 AM  Result Value Ref Range   Osmolality 271 (L) 275 - 295 mOsm/kg    Comment: Performed at [facility], [address], [place], [place] [address]  TSH     Status: None   Collection Time: 02/17/24 12:42 AM  Result Value Ref Range   TSH 2.170 0.350 - 4.500 uIU/mL    Comment: Performed at [facility], [address], [place], [place] [address]  T4, free     Status: None   Collection Time: 02/17/24 12:42 AM  Result Value Ref Range   Free T4 1.33 0.80 - 2.00 ng/dL    Comment: Performed at [facility], [address] [place], [place] [address]  HIV Antibody (routine testing w rflx)     Status: None   Collection Time: 02/17/24 12:42 AM  Result Value Ref Range   HIV Screen 4th Generation wRfx Non Reactive Non Reactive    Comment: Performed at [facility], [address] [place], [place] [address]  Magnesium     Status: None   Collection Time: 02/17/24 12:42 AM  Result Value Ref Range   Magnesium 2.2 1.7 - 2.4 mg/dL    Comment: Performed at [facility], [address], [place], [place] [address]  Phosphorus     Status: None   Collection Time: 02/17/24 12:42 AM  Result Value Ref Range   Phosphorus 3.5 2.5 - 4.6 mg/dL    Comment: Performed at [facility], [address] [place], [place] [address]  Comprehensive  metabolic panel     Status: Abnormal   Collection Time: 02/17/24 12:42 AM  Result Value Ref Range   Sodium 131 (L) 135 - 145 mmol/L   Potassium 3.3 (L) 3.5 - 5.1 mmol/L   Chloride 91 (L) 98 - 111 mmol/L   CO2 26 22 - 32 mmol/L   Glucose, Bld 108 (H) 70 - 99 mg/dL    Comment: Glucose reference range applies only to samples taken after fasting for at least 8 hours.   BUN 12 8 - 23 mg/dL   Creatinine, Ser 9.18 0.61 - 1.24 mg/dL   Calcium 89.8 8.9 - 89.6 mg/dL  Total Protein 8.1 6.5 - 8.1 g/dL   Albumin  4.0 3.5 - 5.0 g/dL   AST 37 15 - 41 U/L   ALT 40 0 - 44 U/L   Alkaline Phosphatase 261 (H) 38 - 126 U/L   Total Bilirubin 1.2 0.0 - 1.2 mg/dL   GFR, Estimated >39 >39 mL/min    Comment: (NOTE) Calculated using the CKD-EPI Creatinine Equation (2021)    Anion gap 14 5 - 15    Comment: Performed at [facility], [address] [place], [place] [address]  CBC     Status: Abnormal   Collection Time: 02/17/24 12:42 AM  Result Value Ref Range   WBC 16.3 (H) 4.0 - 10.5 K/uL   RBC 4.37 4.22 - 5.81 MIL/uL   Hemoglobin 12.9 (L) 13.0 - 17.0 g/dL   HCT 62.5 (L) 60.9 - 47.9 %   MCV 85.6 80.0 - 100.0 fL   MCH 29.5 26.0 - 34.0 pg   MCHC 34.5 30.0 - 36.0 g/dL   RDW 88.0 88.4 - 84.4 %   Platelets 280 150 - 400 K/uL   nRBC 0.0 0.0 - 0.2 %    Comment: Performed at [facility], [address], [place], [place] [address]  CK     Status: None   Collection Time: 02/17/24 12:43 AM  Result Value Ref Range   Total CK 228 49 - 397 U/L    Comment: Performed at [facility], [address], [place], [place] [address]  Creatinine, urine, random     Status: None   Collection Time: 02/17/24  4:15 AM  Result Value Ref Range   Creatinine, Urine 125 mg/dL    Comment: NO NORMAL RANGE ESTABLISHED FOR THIS TEST Performed at [facility], [address], [place], [place] [address]   Sodium, urine, random     Status: None    Collection Time: 02/17/24  4:15 AM  Result Value Ref Range   Sodium, Ur 31 mmol/L    Comment: NO NORMAL RANGE ESTABLISHED FOR THIS TEST Performed at [facility], [address] [place], [place] [address]    CT ABDOMEN PELVIS W CONTRAST Result Date: 02/16/2024 EXAM: CT ABDOMEN AND PELVIS WITH CONTRAST 02/16/2024 07:57:25 PM TECHNIQUE: CT of the abdomen and pelvis was performed with the administration of 100 mL of iohexol  (OMNIPAQUE ) 350 MG/ML injection. Multiplanar reformatted images are provided for review. Automated exposure control, iterative reconstruction, and/or weight-based adjustment of the mA/kV was utilized to reduce the radiation dose to as low as reasonably achievable. COMPARISON: None available. CLINICAL HISTORY: Epigastric pain; cp, epigastric abd pain, leukocytosis. FINDINGS: LOWER CHEST: No acute abnormality. LIVER: Hepatic steatosis. GALLBLADDER AND BILE DUCTS: Distended gallbladder with wall thickening and pericholecystic fluid compatible with acute cholecystitis. No radiopaque stone or biliary ductal dilatation. SPLEEN: No acute abnormality. PANCREAS: No acute abnormality. ADRENAL GLANDS: No acute abnormality. KIDNEYS, URETERS AND BLADDER: No stones in the kidneys or ureters. No hydronephrosis. No perinephric or periureteral stranding. Urinary bladder is unremarkable. GI AND BOWEL: Stomach demonstrates no acute abnormality. Colonic diverticulosis without evidence of diverticulitis. There is no bowel obstruction. PERITONEUM AND RETROPERITONEUM: No ascites. No free air. VASCULATURE: Aorta is normal in caliber. Aortic atherosclerotic calcification. LYMPH NODES: No lymphadenopathy. REPRODUCTIVE ORGANS: No acute abnormality. BONES AND SOFT TISSUES: No acute osseous abnormality. No focal soft tissue abnormality. IMPRESSION: 1. Acute cholecystitis. Electronically signed by: Norman Gatlin MD 02/16/2024 08:10 PM EST RP Workstation: [id number]   CT Angio  Chest PE W and/or Wo  Contrast Result Date: 02/16/2024 EXAM: CTA CHEST 02/16/2024 07:57:25 PM TECHNIQUE: CTA of the chest was performed with the administration of 100 mL of iohexol  (OMNIPAQUE ) 350 MG/ML injection. Multiplanar reformatted images are provided for review. MIP images are provided for review. Automated exposure control, iterative reconstruction, and/or weight based adjustment of the mA/kV was utilized to reduce the radiation dose to as low as reasonably achievable. COMPARISON: Comparison with the same day x-ray. CLINICAL HISTORY: Pulmonary embolism (PE) suspected, high prob; cough, cp, abd pain. FINDINGS: PULMONARY ARTERIES: Pulmonary arteries are adequately opacified for evaluation. No acute pulmonary embolus. Main pulmonary artery is normal in caliber. MEDIASTINUM: Coronary artery and aortic atherosclerotic calcification. The heart and pericardium demonstrate no acute abnormality. There is no acute abnormality of the thoracic aorta. LYMPH NODES: No mediastinal, hilar or axillary lymphadenopathy. LUNGS AND PLEURA: Emphysema. Right lower lobe atelectasis. No evidence of pleural effusion or pneumothorax. UPPER ABDOMEN: Hepatic steatosis. SOFT TISSUES AND BONES: No acute bone or soft tissue abnormality. IMPRESSION: 1. No pulmonary embolism. 2. Emphysema. Pulmonary emphysema is an independent risk factor for lung cancer. Recommend consideration for evaluation for a low-dose CT lung cancer screening program. Electronically signed by: Norman Gatlin MD 02/16/2024 08:07 PM EST RP Workstation: [id number]   DG Chest Portable 1 View Result Date: 02/16/2024 CLINICAL DATA:  Cough. EXAM: PORTABLE CHEST 1 VIEW COMPARISON:  05/11/2021. FINDINGS: The heart size and mediastinal contours are within normal limits. There is elevation of the right diaphragm. Atelectasis is noted at the lung bases bilaterally. No effusion or pneumothorax is seen. 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 IMPRESSION: Elevation of the right diaphragm with  atelectasis at the lung bases. Electronically Signed   By: Leita Birmingham M.D.   On: 02/16/2024 17:48      Assessment/Plan Acute cholecystitis The patient has been seen, examined, labs, vitals, chart, and imaging personally reviewed.  He has evidence of cholecystitis on his CT scan.  We will plan to proceed with a lap chole today.  I have explained the procedure, risks, and aftercare of cholecystectomy.  Risks include but are not limited to bleeding, infection, wound problems, anesthesia, diarrhea, bile leak, injury to common bile duct/liver/intestine.  He seems to understand and agrees to proceed.  His daughter was on the phone and wife at the bedside who heard all of this as well.  All questions were answered from each party.  I will discuss the need for possible IOC with Dr. Tanda given his rising alk phos.  His TB was 1.4 4 days ago but is now 1.2.  we will use ICG dye as well.  Cont abx therapy for now.   FEN - NPO/IIVFs per primary  VTE - SCDs, likely lovenox  tomorrow ID - zosyn   HTN MSSA septic arthritis of L shoulder  I reviewed nursing notes, hospitalist notes, last 24 h vitals and pain scores, last 48 h intake and output, last 24 h labs and trends, and last 24 h imaging results.  Burnard FORBES Banter, PA-C [facility] 02/17/2024, 11:13 AM Please see Amion for pager number during day hours 7:00am-4:30pm or 7:00am -11:30am on weekends      [1]  Allergies Allergen Reactions   Hydrocodone -Acetaminophen  Nausea And Vomiting   Irbesartan      Tingling, swelling  Pt unaware of allergy reaction    Percocet [Oxycodone -Acetaminophen ] Nausea And Vomiting   Shingrix [Zoster Vac Recomb Adjuvanted] Itching   "

## 2024-02-17 NOTE — Subjective & Objective (Signed)
 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 Sebastian who will follow along patient started on Zosyn 

## 2024-02-17 NOTE — Anesthesia Procedure Notes (Deleted)
 Procedure Name: Intubation Date/Time: 02/17/2024 2:21 PM  Performed by: Augusta Daved SAILOR, CRNAPre-anesthesia Checklist: Patient identified, Emergency Drugs available, Suction available and Patient being monitored Patient Re-evaluated:Patient Re-evaluated prior to induction Oxygen Delivery Method: Circle System Utilized Preoxygenation: Pre-oxygenation with 100% oxygen Induction Type: IV induction Ventilation: Mask ventilation without difficulty and Oral airway inserted - appropriate to patient size Laryngoscope Size: Cleotilde and 2 Grade View: Grade I Tube type: Oral Tube size: 7.5 mm Number of attempts: 1 Airway Equipment and Method: Stylet and Oral airway Placement Confirmation: ETT inserted through vocal cords under direct vision, positive ETCO2 and breath sounds checked- equal and bilateral Secured at: 23 cm Tube secured with: Tape Dental Injury: Teeth and Oropharynx as per pre-operative assessment  Comments: DL/intubation by Dr. Debby.

## 2024-02-18 ENCOUNTER — Encounter (HOSPITAL_COMMUNITY): Payer: Self-pay | Admitting: General Surgery

## 2024-02-18 ENCOUNTER — Inpatient Hospital Stay (HOSPITAL_COMMUNITY)

## 2024-02-18 DIAGNOSIS — K810 Acute cholecystitis: Secondary | ICD-10-CM

## 2024-02-18 LAB — COMPREHENSIVE METABOLIC PANEL WITH GFR
ALT: 414 U/L — ABNORMAL HIGH (ref 0–44)
AST: 302 U/L — ABNORMAL HIGH (ref 15–41)
Albumin: 3.6 g/dL (ref 3.5–5.0)
Alkaline Phosphatase: 467 U/L — ABNORMAL HIGH (ref 38–126)
Anion gap: 13 (ref 5–15)
BUN: 11 mg/dL (ref 8–23)
CO2: 25 mmol/L (ref 22–32)
Calcium: 9.2 mg/dL (ref 8.9–10.3)
Chloride: 95 mmol/L — ABNORMAL LOW (ref 98–111)
Creatinine, Ser: 0.75 mg/dL (ref 0.61–1.24)
GFR, Estimated: >60 mL/min
Glucose, Bld: 147 mg/dL — ABNORMAL HIGH (ref 70–99)
Potassium: 3.7 mmol/L (ref 3.5–5.1)
Sodium: 132 mmol/L — ABNORMAL LOW (ref 135–145)
Total Bilirubin: 5 mg/dL — ABNORMAL HIGH (ref 0.0–1.2)
Total Protein: 6.9 g/dL (ref 6.5–8.1)

## 2024-02-18 LAB — GLUCOSE, CAPILLARY: Glucose-Capillary: 182 mg/dL — ABNORMAL HIGH (ref 70–99)

## 2024-02-18 LAB — CBC WITH DIFFERENTIAL/PLATELET
Abs Immature Granulocytes: 0.25 K/uL — ABNORMAL HIGH (ref 0.00–0.07)
Basophils Absolute: 0 K/uL (ref 0.0–0.1)
Basophils Relative: 0 %
Eosinophils Absolute: 0 K/uL (ref 0.0–0.5)
Eosinophils Relative: 0 %
HCT: 34.7 % — ABNORMAL LOW (ref 39.0–52.0)
Hemoglobin: 12 g/dL — ABNORMAL LOW (ref 13.0–17.0)
Immature Granulocytes: 1 %
Lymphocytes Relative: 5 %
Lymphs Abs: 0.8 K/uL (ref 0.7–4.0)
MCH: 30 pg (ref 26.0–34.0)
MCHC: 34.6 g/dL (ref 30.0–36.0)
MCV: 86.8 fL (ref 80.0–100.0)
Monocytes Absolute: 1.3 K/uL — ABNORMAL HIGH (ref 0.1–1.0)
Monocytes Relative: 7 %
Neutro Abs: 15.1 K/uL — ABNORMAL HIGH (ref 1.7–7.7)
Neutrophils Relative %: 87 %
Platelets: 261 K/uL (ref 150–400)
RBC: 4 MIL/uL — ABNORMAL LOW (ref 4.22–5.81)
RDW: 11.9 % (ref 11.5–15.5)
WBC: 17.5 K/uL — ABNORMAL HIGH (ref 4.0–10.5)
nRBC: 0 % (ref 0.0–0.2)

## 2024-02-18 LAB — CBC
HCT: 35.6 % — ABNORMAL LOW (ref 39.0–52.0)
Hemoglobin: 12.1 g/dL — ABNORMAL LOW (ref 13.0–17.0)
MCH: 29.7 pg (ref 26.0–34.0)
MCHC: 34 g/dL (ref 30.0–36.0)
MCV: 87.5 fL (ref 80.0–100.0)
Platelets: 339 K/uL (ref 150–400)
RBC: 4.07 MIL/uL — ABNORMAL LOW (ref 4.22–5.81)
RDW: 12.1 % (ref 11.5–15.5)
WBC: 19.6 K/uL — ABNORMAL HIGH (ref 4.0–10.5)
nRBC: 0 % (ref 0.0–0.2)

## 2024-02-18 LAB — T3: T3, Total: 74 ng/dL (ref 71–180)

## 2024-02-18 MED ORDER — CARMEX CLASSIC LIP BALM EX OINT: TOPICAL_OINTMENT | CUTANEOUS | Status: DC | PRN

## 2024-02-18 MED ORDER — SODIUM CHLORIDE 0.9 % IV SOLN: INTRAVENOUS | Status: AC

## 2024-02-18 MED ORDER — PIPERACILLIN-TAZOBACTAM 3.375 G IVPB
3.3750 g | Freq: Three times a day (TID) | INTRAVENOUS | Status: AC
  Administered 2024-02-18 – 2024-02-23 (×15): 3.375 g via INTRAVENOUS
  Filled 2024-02-18 (×13): qty 50

## 2024-02-18 NOTE — Plan of Care (Signed)
  Problem: Education: Goal: Knowledge of General Education information will improve Description: Including pain rating scale, medication(s)/side effects and non-pharmacologic comfort measures Outcome: Progressing   Problem: Coping: Goal: Level of anxiety will decrease Outcome: Progressing   Problem: Pain Managment: Goal: General experience of comfort will improve and/or be controlled Outcome: Progressing

## 2024-02-18 NOTE — Plan of Care (Signed)

## 2024-02-18 NOTE — Progress Notes (Signed)
 Patient ID: Nathan Stewart, male   DOB: [birth year], 68 y.o.   MRN: [id number] I was called to see the patient for acute worsening of his abdominal pain this afternoon.  After arriving the pain seems to be improving.  He also ran a fever this morning of 101 but it seems to be going down to normal now.  Today he has had some darkish fluid from his drain which could be some bile tinged bloody fluid but the fluid is dark and not like acute blood loss.  At this point since he is feeling better we will continue to monitor him.  We will check a hemoglobin.  He is still scheduled for MRCP this afternoon.  We will follow-up on the study once it is done.

## 2024-02-18 NOTE — Progress Notes (Addendum)
 Pt rang to nurses station complaining of acute, severe abdominal pain pt describes as a 10/10, sharp, stabbing lower abdominal pain that just came out of nowhere.   Alerted Ashley Bunker, CN RN who assessed pt with me. Pt is found sitting on window seat, tachypneic, profusely sweating, and holding his abdomen. Vitals obtained as below. PRN fentanyl  administered, TRH paged (requested sx paged also). Orders placed to transfer pt.     02/18/24 1440  Assess: MEWS Score  Temp 98.3 F (36.8 C)  BP (!) 171/114  MAP (mmHg) 129  Pulse Rate (!) 132  Resp (!) 36  Level of Consciousness Alert  SpO2 95 %  O2 Device Room Air  Assess: MEWS Score  MEWS Temp 0  MEWS Systolic 0  MEWS Pulse 3  MEWS RR 3  MEWS LOC 0  MEWS Score 6  MEWS Score Color Red  Assess: if the MEWS score is Yellow or Red  Were vital signs accurate and taken at a resting state? Yes  Does the patient meet 2 or more of the SIRS criteria? Yes  Does the patient have a confirmed or suspected source of infection? Yes  MEWS guidelines implemented  Yes, red  Treat  MEWS Interventions Considered administering scheduled or prn medications/treatments as ordered  Take Vital Signs  Increase Vital Sign Frequency  Red: Q1hr x2, continue Q4hrs until patient remains green for 12hrs  Escalate  MEWS: Escalate Red: Discuss with charge nurse and notify provider. Consider notifying RRT. If remains red for 2 hours consider need for higher level of care  Notify: Charge Nurse/RN  Name of Charge Nurse/RN Notified Ashley Bunker, RN  Provider Notification  Provider Name/Title Lebron Cage, MD  Date Provider Notified 02/18/24  Time Provider Notified 1455  Method of Notification Page  Notification Reason Other (Comment) (RED MEWS)  Provider response Evaluate remotely;See new orders  Date of Provider Response 02/18/24  Time of Provider Response 1457  Assess: SIRS CRITERIA  SIRS Temperature  0  SIRS Respirations  1  SIRS Pulse 1   SIRS WBC 0  SIRS Score Sum  2

## 2024-02-18 NOTE — Progress Notes (Signed)
 Alerted TRH to elevated temperature at afternoon vitals check. TRH requested I relay info to surgical team and place pt on telemetry. Pt awaiting MRCP this afternoon.     02/18/24 1339  Vitals  Temp (!) 101 F (38.3 C) (notify to the nurse.)  Temp Source Oral  BP (!) 154/77 (notify to the nurse.)  MAP (mmHg) 98  BP Location Left Arm  BP Method Automatic  Patient Position (if appropriate) Lying  Pulse Rate 92  Pulse Rate Source Monitor  Resp 18  Level of Consciousness  Level of Consciousness Alert  MEWS COLOR  MEWS Score Color Green  Oxygen Therapy  SpO2 97 %  O2 Device Room Air  MEWS Score  MEWS Temp 1  MEWS Systolic 0  MEWS Pulse 0  MEWS RR 0  MEWS LOC 0  MEWS Score 1

## 2024-02-18 NOTE — Progress Notes (Signed)
 1 Day Post-Op   Subjective/Chief Complaint: Sore  No n/v Family member at [facility]   Objective: Vital signs in last 24 hours: Temp:  [97.5 F (36.4 C)-100.1 F (37.8 C)] 99.6 F (37.6 C) (12/24 0619) Pulse Rate:  [84-92] 87 (12/24 0619) Resp:  [16-21] 18 (12/24 0619) BP: (141-187)/(66-90) 151/74 (12/24 0619) SpO2:  [91 %-99 %] 99 % (12/24 0619) Weight:  [103 kg] 103 kg (12/23 1229) Last BM Date : 02/11/24  Intake/Output from previous day: 12/23 0701 - 12/24 0700 In: 4068.2 [P.O.:1110; I.V.:2353.9; IV Piggyback:604.3] Out: 1180 [Urine:820; Drains:110; Blood:250] Intake/Output this shift: No intake/output data recorded.  Alert, nontoxic Symm chest rise, nonlabored Reg Soft, mild approp TTP, dressing c/d/I; JP - slight brownish appearance +SCDs  Lab Results:  Recent Labs    02/17/24 0042 02/18/24 0454  WBC 16.3* 17.5*  HGB 12.9* 12.0*  HCT 37.4* 34.7*  PLT 280 261   BMET Recent Labs    02/17/24 0042 02/18/24 0454  NA 131* 132*  K 3.3* 3.7  CL 91* 95*  CO2 26 25  GLUCOSE 108* 147*  BUN 12 11  CREATININE 0.81 0.75  CALCIUM 10.1 9.2      Latest Ref Rng & Units 02/18/2024    4:54 AM 02/17/2024   12:42 AM 02/16/2024    5:20 PM  Hepatic Function  Total Protein 6.5 - 8.1 g/dL 6.9  8.1  8.0   Albumin  3.5 - 5.0 g/dL 3.6  4.0  4.1   AST 15 - 41 U/L 302  37  29   ALT 0 - 44 U/L 414  40  34   Alk Phosphatase 38 - 126 U/L 467  261  122   Total Bilirubin 0.0 - 1.2 mg/dL 5.0  1.2  1.1     PT/INR No results for input(s): LABPROT, INR in the last 72 hours. ABG No results for input(s): PHART, HCO3 in the last 72 hours.  Invalid input(s): PCO2, PO2  Studies/Results: CT ABDOMEN PELVIS W CONTRAST Result Date: 02/16/2024 EXAM: CT ABDOMEN AND PELVIS WITH CONTRAST 02/16/2024 07:57:25 PM TECHNIQUE: CT of the abdomen and pelvis was performed with the administration of 100 mL of iohexol  (OMNIPAQUE ) 350 MG/ML injection. Multiplanar reformatted images are  provided for review. Automated exposure control, iterative reconstruction, and/or weight-based adjustment of the mA/kV was utilized to reduce the radiation dose to as low as reasonably achievable. COMPARISON: None available. CLINICAL HISTORY: Epigastric pain; cp, epigastric abd pain, leukocytosis. FINDINGS: LOWER CHEST: No acute abnormality. LIVER: Hepatic steatosis. GALLBLADDER AND BILE DUCTS: Distended gallbladder with wall thickening and pericholecystic fluid compatible with acute cholecystitis. No radiopaque stone or biliary ductal dilatation. SPLEEN: No acute abnormality. PANCREAS: No acute abnormality. ADRENAL GLANDS: No acute abnormality. KIDNEYS, URETERS AND BLADDER: No stones in the kidneys or ureters. No hydronephrosis. No perinephric or periureteral stranding. Urinary bladder is unremarkable. GI AND BOWEL: Stomach demonstrates no acute abnormality. Colonic diverticulosis without evidence of diverticulitis. There is no bowel obstruction. PERITONEUM AND RETROPERITONEUM: No ascites. No free air. VASCULATURE: Aorta is normal in caliber. Aortic atherosclerotic calcification. LYMPH NODES: No lymphadenopathy. REPRODUCTIVE ORGANS: No acute abnormality. BONES AND SOFT TISSUES: No acute osseous abnormality. No focal soft tissue abnormality. IMPRESSION: 1. Acute cholecystitis. Electronically signed by: Norman Gatlin MD 02/16/2024 08:10 PM EST RP Workstation: [id number]   CT Angio Chest PE W and/or Wo Contrast Result Date: 02/16/2024 EXAM: CTA CHEST 02/16/2024 07:57:25 PM TECHNIQUE: CTA of the chest was performed with the administration of 100 mL of iohexol  (OMNIPAQUE ) 350  MG/ML injection. Multiplanar reformatted images are provided for review. MIP images are provided for review. Automated exposure control, iterative reconstruction, and/or weight based adjustment of the mA/kV was utilized to reduce the radiation dose to as low as reasonably achievable. COMPARISON: Comparison with the same day x-ray. CLINICAL  HISTORY: Pulmonary embolism (PE) suspected, high prob; cough, cp, abd pain. FINDINGS: PULMONARY ARTERIES: Pulmonary arteries are adequately opacified for evaluation. No acute pulmonary embolus. Main pulmonary artery is normal in caliber. MEDIASTINUM: Coronary artery and aortic atherosclerotic calcification. The heart and pericardium demonstrate no acute abnormality. There is no acute abnormality of the thoracic aorta. LYMPH NODES: No mediastinal, hilar or axillary lymphadenopathy. LUNGS AND PLEURA: Emphysema. Right lower lobe atelectasis. No evidence of pleural effusion or pneumothorax. UPPER ABDOMEN: Hepatic steatosis. SOFT TISSUES AND BONES: No acute bone or soft tissue abnormality. IMPRESSION: 1. No pulmonary embolism. 2. Emphysema. Pulmonary emphysema is an independent risk factor for lung cancer. Recommend consideration for evaluation for a low-dose CT lung cancer screening program. Electronically signed by: Norman Gatlin MD 02/16/2024 08:07 PM EST RP Workstation: [id number]   DG Chest Portable 1 View Result Date: 02/16/2024 CLINICAL DATA:  Cough. EXAM: PORTABLE CHEST 1 VIEW COMPARISON:  05/11/2021. FINDINGS: The heart size and mediastinal contours are within normal limits. There is elevation of the right diaphragm. Atelectasis is noted at the lung bases bilaterally. No effusion or pneumothorax is seen. 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 IMPRESSION: Elevation of the right diaphragm with atelectasis at the lung bases. Electronically Signed   By: Leita Birmingham M.D.   On: 02/16/2024 17:48    Anti-infectives: Anti-infectives (From admission, onward)    Start     Dose/Rate Route Frequency Ordered Stop   02/17/24 2200  piperacillin -tazobactam (ZOSYN ) IVPB 3.375 g        3.375 g 12.5 mL/hr over 240 Minutes Intravenous Every 8 hours 02/17/24 1708 02/18/24 2159   02/17/24 0600  piperacillin -tazobactam (ZOSYN ) IVPB 3.375 g  Status:  Discontinued        3.375 g 12.5 mL/hr over 240 Minutes  Intravenous Every 8 hours 02/17/24 0025 02/17/24 1708   02/16/24 2145  piperacillin -tazobactam (ZOSYN ) IVPB 3.375 g        3.375 g 100 mL/hr over 30 Minutes Intravenous  Once 02/16/24 2137 02/16/24 2232       Assessment/Plan: s/p Procedures with comments: LAPAROSCOPIC CHOLECYSTECTOMY (N/A) - ICG, 12/23 Dr Tanda for severe gangrenous cholecystitis -difficult surgery with intra-op bleeding controlled with clips, also surgical Snow x2 and surgicell powder -LFTs bumped today (AP preop was trending up), Tbili 5; repeat CMET in am -given bump in Tbili and AP, will check MRI abd with MRCP to evaluate bile duct for possible CBD stone, lower suspicion for bile duct injury -drain - expected appearance given snow and powder -no signs of addl bleeding, hgb stable  ID- completed 24 hrs postop zosyn  VTE prophylaxis - scds, can start prophylactic lovenox  in AM if hgb remains stable FEN - NPO for 6 hrs for MRI, then can resume diet  Discussed intra-op findings with pt and family member at [facility], discussed bump in LFTs and need to investigate with addl imaging, discussed potential diffl dx, if CBD stone- will need ERCP; also discussed possibility for bile leak given how severe the inflammation was and partially necrotic cystic duct  Camellia HERO. Tanda, MD, FACS General, Bariatric, & Minimally Invasive Surgery [facility] Surgery,  A [facility]    LOS: 2 days    Camellia Tanda 02/18/2024

## 2024-02-18 NOTE — Progress Notes (Signed)
 "          PROGRESS NOTE  Nathan Stewart FMW:[id number] DOB: 02-Nov-1955 DOA: 02/16/2024 PCP: Nathan Stewart DASEN, MD  Brief History:  68 y.o. 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 He states the pain resolved, but for the last week he has been having nausea and unable to eat. He has been drinking though and able to keep it down. Pt was found to have acute cholecystitis (no stones seen on CT) on a CT scan. His WBC 18.0 in ED. General surgery consulted and pt taken to surgery for lap chole on 02/17/24.    Today, patient complaining of acute, severe abdominal pain, 10/10, sharp, stabbing around lower abdomen, noted to be tachypneic, tachycardic, sweating profusely.  Writer reassessed patient at bedside, reporting feeling better after pain medicine.  General surgery also presented at bedside to evaluate patient.   Assessment/Plan: Acute cholecystitis s/p lap chole on 12/23, noted necrotic gallbladder and cystic duct (encountered a bleeding vessel controlled with a clip during surgery) Elevated LFTs s/p lap chole ?  Mild ileus Currently afebrile, worsening leukocytosis Noted worsening LFTs, including T. 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 intake and dehydration Continue IV fluids           Consultants:  general surgery  Code Status:  FULL   DVT Prophylaxis:  SCD   Procedures: As Listed in Progress Note Above  Antibiotics: Zosyn  12/23>>   Objective: Vitals:   02/18/24 1501 02/18/24 1539 02/18/24 1640 02/18/24 1927  BP: (!) 153/82 (!) 163/85 (!) 141/78 (!) 140/76  Pulse: (!) 112 97 94 86  Resp: (!) 24 18 18 17   Temp: 99.5 F (37.5 C) 98.9 F (37.2 C) 98.7 F (37.1 C) 97.7 F (36.5 C)  TempSrc: Oral Oral Oral   SpO2: 92% 98% 95% 96%  Weight:      Height:        Intake/Output Summary (Last 24 hours) at 02/18/2024 1935 Last data filed at 02/18/2024 1800 Gross per 24 hour  Intake 2563.72 ml  Output 1645 ml  Net 918.72 ml   Weight change:  Exam: General: NAD  Cardiovascular: S1, S2 present Respiratory: CTAB Abdomen: Soft, tender, distended, no bowel sounds Musculoskeletal: No bilateral pedal edema noted Skin: Normal Psychiatry: Normal mood     Data Reviewed: I have personally reviewed following labs and imaging studies Basic Metabolic Panel: Recent Labs  Lab 02/13/24 1722 02/16/24 1720 02/17/24 0042 02/18/24 0454  NA 130* 129* 131* 132*  K 3.3* 3.0* 3.3* 3.7  CL 89* 87* 91* 95*  CO2 20 26 26 25   GLUCOSE 120* 112* 108* 147*  BUN 14 12 12 11   CREATININE 1.00 0.88 0.81 0.75  CALCIUM 9.8 10.8* 10.1 9.2  MG  --   --  2.2  --   PHOS  --   --  3.5  --    Liver Function Tests: Recent Labs  Lab 02/13/24 1722 02/16/24 1720 02/17/24 0042 02/18/24 0454  AST 20 29 37 302*  ALT 29 34 40 414*  ALKPHOS 100 122 261* 467*  BILITOT 1.4* 1.1 1.2 5.0*  PROT 7.4 8.0 8.1 6.9  ALBUMIN  4.7 4.1 4.0 3.6   Recent Labs  Lab 02/13/24 1722 02/16/24 1720  LIPASE 16 30   No results for input(s): AMMONIA in the last 168 hours. Coagulation Profile: No results for input(s): INR, PROTIME in the last 168  hours. CBC: Recent Labs  Lab 02/13/24 1722 02/16/24 1720 02/17/24 0042 02/18/24 0454 02/18/24 1553  WBC 24.4* 18.0* 16.3* 17.5* 19.6*  NEUTROABS 21.5*  --   --  15.1*  --   HGB 14.8 13.5 12.9* 12.0* 12.1*  HCT 42.8 38.6* 37.4* 34.7* 35.6*  MCV 88 85.0 85.6 86.8 87.5  PLT 234 273 280 261 339   Cardiac Enzymes: Recent Labs  Lab 02/17/24 0043  CKTOTAL 228   BNP: Invalid input(s): POCBNP CBG: Recent Labs  Lab 02/18/24 1521  GLUCAP 182*   HbA1C: No results for input(s): HGBA1C in the last 72 hours. Urine analysis:    Component Value Date/Time   COLORURINE YELLOW 02/16/2024 1840   APPEARANCEUR CLEAR 02/16/2024 1840   LABSPEC 1.007 02/16/2024 1840   PHURINE 7.0 02/16/2024 1840   GLUCOSEU NEGATIVE 02/16/2024 1840   HGBUR SMALL (A) 02/16/2024 1840   HGBUR trace-lysed 03/20/2010 0814   BILIRUBINUR NEGATIVE 02/16/2024 1840   BILIRUBINUR small (A) 02/13/2024 1641   BILIRUBINUR n 06/07/2013 1107   KETONESUR NEGATIVE 02/16/2024 1840   PROTEINUR NEGATIVE 02/16/2024 1840   UROBILINOGEN 1.0 02/13/2024 1641   UROBILINOGEN 0.2 11/16/2014 0907   NITRITE NEGATIVE 02/16/2024 1840   LEUKOCYTESUR NEGATIVE 02/16/2024 1840   Sepsis Labs: @LABRCNTIP (procalcitonin:4,lacticidven:4) ) Recent Results (from the past 240 hours)  Urine Culture     Status: None   Collection Time: 02/13/24  5:05 PM   Specimen: Urine, Clean Catch  Result Value Ref Range Status   Specimen Description   Final    URINE, CLEAN CATCH Performed at [facility], [address], [place], [place] [address]    Special Requests   Final    NONE Performed at [facility], [address], [place], [place] [address]    Culture   Final    NO GROWTH Performed at [facility], [address] [place], [place] [address]    Report Status 02/15/2024 FINAL  Final  Blood culture (routine x 2)     Status: None (Preliminary result)   Collection Time: 02/16/24  5:20 PM   Specimen: BLOOD  Result Value Ref Range  Status   Specimen Description   Final    BLOOD BLOOD RIGHT HAND Performed at [facility], [address] [place], [place] [address]    Special Requests   Final    BOTTLES DRAWN AEROBIC AND ANAEROBIC Blood Culture adequate volume Performed at [facility], [address], [place], [place] [address]    Culture   Final    NO GROWTH 1 DAY Performed at [facility], [address], [place], [place] [address]    Report Status PENDING  Incomplete  Resp panel by RT-PCR (RSV, Flu A&B, Covid) Anterior Nasal Swab     Status: None  Collection Time: 02/16/24  5:20 PM   Specimen: Anterior Nasal Swab  Result Value Ref Range Status   SARS Coronavirus 2 by RT PCR NEGATIVE NEGATIVE Final    Comment: (NOTE) SARS-CoV-2 target nucleic acids are NOT DETECTED.  The SARS-CoV-2 RNA is generally detectable in upper respiratory specimens during the acute phase of infection. The lowest concentration of SARS-CoV-2 viral copies this assay can detect is 138 copies/mL. A negative result does not preclude SARS-Cov-2 infection and should not be used as the sole basis for treatment or other patient management decisions. A negative result may occur with  improper specimen collection/handling, submission of specimen other than nasopharyngeal swab, presence of viral mutation(s) within the areas targeted by this assay, and inadequate number of viral copies(<138 copies/mL). A negative result must be combined with clinical observations, patient history, and epidemiological information. The expected result is Negative.  Fact Sheet for Patients:  bloggercourse.com  Fact Sheet for Healthcare Providers:  seriousbroker.it  This test is no t yet approved or cleared by the [place]  FDA and  has been authorized for detection and/or diagnosis of SARS-CoV-2 by FDA under an Emergency Use Authorization (EUA). This EUA will  remain  in effect (meaning this test can be used) for the duration of the COVID-19 declaration under Section 564(b)(1) of the Act, 21 U.S.C.section 360bbb-3(b)(1), unless the authorization is terminated  or revoked sooner.       Influenza A by PCR NEGATIVE NEGATIVE Final   Influenza B by PCR NEGATIVE NEGATIVE Final    Comment: (NOTE) The Xpert Xpress SARS-CoV-2/FLU/RSV plus assay is intended as an aid in the diagnosis of influenza from Nasopharyngeal swab specimens and should not be used as a sole basis for treatment. Nasal washings and aspirates are unacceptable for Xpert Xpress SARS-CoV-2/FLU/RSV testing.  Fact Sheet for Patients: bloggercourse.com  Fact Sheet for Healthcare Providers: seriousbroker.it  This test is not yet approved or cleared by the [place]  FDA and has been authorized for detection and/or diagnosis of SARS-CoV-2 by FDA under an Emergency Use Authorization (EUA). This EUA will remain in effect (meaning this test can be used) for the duration of the COVID-19 declaration under Section 564(b)(1) of the Act, 21 U.S.C. section 360bbb-3(b)(1), unless the authorization is terminated or revoked.     Resp Syncytial Virus by PCR NEGATIVE NEGATIVE Final    Comment: (NOTE) Fact Sheet for Patients: bloggercourse.com  Fact Sheet for Healthcare Providers: seriousbroker.it  This test is not yet approved or cleared by the [place]  FDA and has been authorized for detection and/or diagnosis of SARS-CoV-2 by FDA under an Emergency Use Authorization (EUA). This EUA will remain in effect (meaning this test can be used) for the duration of the COVID-19 declaration under Section 564(b)(1) of the Act, 21 U.S.C. section 360bbb-3(b)(1), unless the authorization is terminated or revoked.  Performed at [facility], [address] [place],  [place] [address]   Blood culture (routine x 2)     Status: None (Preliminary result)   Collection Time: 02/16/24  5:25 PM   Specimen: BLOOD  Result Value Ref Range Status   Specimen Description   Final    BLOOD BLOOD RIGHT FOREARM Performed at [facility], [address] [place], [place] [address]    Special Requests   Final    BOTTLES DRAWN AEROBIC AND ANAEROBIC Blood Culture adequate volume Performed at [facility], [address] [place], [place] [address]    Culture   Final  NO GROWTH 1 DAY Performed at [facility], [address], [place], [place] [address]    Report Status PENDING  Incomplete     Scheduled Meds:  acetaminophen   1,000 mg Oral Q6H   metoprolol  succinate  100 mg Oral Daily   Continuous Infusions:  sodium chloride  100 mL/hr at 02/18/24 1452   piperacillin -tazobactam (ZOSYN )  IV      Procedures/Studies: DG Abd Portable 1V Result Date: 02/18/2024 CLINICAL DATA:  Distension postop cholecystostomy EXAM: PORTABLE ABDOMEN - 1 VIEW COMPARISON:  CT 02/16/2024 FINDINGS: Postop changes in the right upper quadrant with clips and probable drainage catheter. Mild increased small and large bowel gas without obstruction. Moderate stool IMPRESSION: Mild increased small and large bowel gas without obstruction, possible mild ileus. Moderate stool. Electronically Signed   By: Luke Bun M.D.   On: 02/18/2024 15:59   DG Chest Port 1 View Result Date: 02/18/2024 CLINICAL DATA:  Shortness of breath EXAM: PORTABLE CHEST 1 VIEW COMPARISON:  02/16/2024 FINDINGS: Hypoventilatory change. Minimal atelectasis right base. No consolidation, pleural effusion or pneumothorax. Normal cardiac size. Aortic atherosclerosis. Left shoulder replacement IMPRESSION: Hypoventilatory changes with minimal atelectasis at the right base. Electronically Signed   By: Luke Bun M.D.   On: 02/18/2024 15:58   CT ABDOMEN PELVIS W CONTRAST Result Date:  02/16/2024 EXAM: CT ABDOMEN AND PELVIS WITH CONTRAST 02/16/2024 07:57:25 PM TECHNIQUE: CT of the abdomen and pelvis was performed with the administration of 100 mL of iohexol  (OMNIPAQUE ) 350 MG/ML injection. Multiplanar reformatted images are provided for review. Automated exposure control, iterative reconstruction, and/or weight-based adjustment of the mA/kV was utilized to reduce the radiation dose to as low as reasonably achievable. COMPARISON: None available. CLINICAL HISTORY: Epigastric pain; cp, epigastric abd pain, leukocytosis. FINDINGS: LOWER CHEST: No acute abnormality. LIVER: Hepatic steatosis. GALLBLADDER AND BILE DUCTS: Distended gallbladder with wall thickening and pericholecystic fluid compatible with acute cholecystitis. No radiopaque stone or biliary ductal dilatation. SPLEEN: No acute abnormality. PANCREAS: No acute abnormality. ADRENAL GLANDS: No acute abnormality. KIDNEYS, URETERS AND BLADDER: No stones in the kidneys or ureters. No hydronephrosis. No perinephric or periureteral stranding. Urinary bladder is unremarkable. GI AND BOWEL: Stomach demonstrates no acute abnormality. Colonic diverticulosis without evidence of diverticulitis. There is no bowel obstruction. PERITONEUM AND RETROPERITONEUM: No ascites. No free air. VASCULATURE: Aorta is normal in caliber. Aortic atherosclerotic calcification. LYMPH NODES: No lymphadenopathy. REPRODUCTIVE ORGANS: No acute abnormality. BONES AND SOFT TISSUES: No acute osseous abnormality. No focal soft tissue abnormality. IMPRESSION: 1. Acute cholecystitis. Electronically signed by: Norman Gatlin MD 02/16/2024 08:10 PM EST RP Workstation: [id number]   CT Angio Chest PE W and/or Wo Contrast Result Date: 02/16/2024 EXAM: CTA CHEST 02/16/2024 07:57:25 PM TECHNIQUE: CTA of the chest was performed with the administration of 100 mL of iohexol  (OMNIPAQUE ) 350 MG/ML injection. Multiplanar reformatted images are provided for review. MIP images are provided for  review. Automated exposure control, iterative reconstruction, and/or weight based adjustment of the mA/kV was utilized to reduce the radiation dose to as low as reasonably achievable. COMPARISON: Comparison with the same day x-ray. CLINICAL HISTORY: Pulmonary embolism (PE) suspected, high prob; cough, cp, abd pain. FINDINGS: PULMONARY ARTERIES: Pulmonary arteries are adequately opacified for evaluation. No acute pulmonary embolus. Main pulmonary artery is normal in caliber. MEDIASTINUM: Coronary artery and aortic atherosclerotic calcification. The heart and pericardium demonstrate no acute abnormality. There is no acute abnormality of the thoracic aorta. LYMPH NODES: No mediastinal, hilar or axillary lymphadenopathy. LUNGS AND PLEURA: Emphysema. Right lower lobe  atelectasis. No evidence of pleural effusion or pneumothorax. UPPER ABDOMEN: Hepatic steatosis. SOFT TISSUES AND BONES: No acute bone or soft tissue abnormality. IMPRESSION: 1. No pulmonary embolism. 2. Emphysema. Pulmonary emphysema is an independent risk factor for lung cancer. Recommend consideration for evaluation for a low-dose CT lung cancer screening program. Electronically signed by: Norman Gatlin MD 02/16/2024 08:07 PM EST RP Workstation: [id number]   DG Chest Portable 1 View Result Date: 02/16/2024 CLINICAL DATA:  Cough. EXAM: PORTABLE CHEST 1 VIEW COMPARISON:  05/11/2021. FINDINGS: The heart size and mediastinal contours are within normal limits. There is elevation of the right diaphragm. Atelectasis is noted at the lung bases bilaterally. No effusion or pneumothorax is seen. 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 IMPRESSION: Elevation of the right diaphragm with atelectasis at the lung bases. Electronically Signed   By: Leita Birmingham M.D.   On: 02/16/2024 17:48    Lebron JINNY Cage, MD  [facility]  If 7PM-7AM, please contact night-coverage [email or web address] 02/18/2024, 7:35 PM   LOS: 2 days   "

## 2024-02-18 NOTE — Progress Notes (Addendum)
 Report called to progressive transfer unit and given to Ronal Hutchinson, RN.

## 2024-02-19 DIAGNOSIS — K810 Acute cholecystitis: Secondary | ICD-10-CM | POA: Diagnosis not present

## 2024-02-19 LAB — COMPREHENSIVE METABOLIC PANEL WITH GFR
ALT: 333 U/L — ABNORMAL HIGH (ref 0–44)
AST: 118 U/L — ABNORMAL HIGH (ref 15–41)
Albumin: 3.5 g/dL (ref 3.5–5.0)
Alkaline Phosphatase: 369 U/L — ABNORMAL HIGH (ref 38–126)
Anion gap: 14 (ref 5–15)
BUN: 17 mg/dL (ref 8–23)
CO2: 23 mmol/L (ref 22–32)
Calcium: 9.1 mg/dL (ref 8.9–10.3)
Chloride: 98 mmol/L (ref 98–111)
Creatinine, Ser: 0.87 mg/dL (ref 0.61–1.24)
GFR, Estimated: >60 mL/min
Glucose, Bld: 131 mg/dL — ABNORMAL HIGH (ref 70–99)
Potassium: 3.8 mmol/L (ref 3.5–5.1)
Sodium: 134 mmol/L — ABNORMAL LOW (ref 135–145)
Total Bilirubin: 3.8 mg/dL — ABNORMAL HIGH (ref 0.0–1.2)
Total Protein: 7.1 g/dL (ref 6.5–8.1)

## 2024-02-19 LAB — CBC
HCT: 34.7 % — ABNORMAL LOW (ref 39.0–52.0)
Hemoglobin: 11.6 g/dL — ABNORMAL LOW (ref 13.0–17.0)
MCH: 29.5 pg (ref 26.0–34.0)
MCHC: 33.4 g/dL (ref 30.0–36.0)
MCV: 88.3 fL (ref 80.0–100.0)
Platelets: 334 K/uL (ref 150–400)
RBC: 3.93 MIL/uL — ABNORMAL LOW (ref 4.22–5.81)
RDW: 12.5 % (ref 11.5–15.5)
WBC: 20.8 K/uL — ABNORMAL HIGH (ref 4.0–10.5)
nRBC: 0 % (ref 0.0–0.2)

## 2024-02-19 LAB — PROCALCITONIN: Procalcitonin: 2.76 ng/mL

## 2024-02-19 LAB — LACTIC ACID, PLASMA
Lactic Acid, Venous: 1.2 mmol/L (ref 0.5–1.9)
Lactic Acid, Venous: 1.2 mmol/L (ref 0.5–1.9)

## 2024-02-19 MED ORDER — ENOXAPARIN SODIUM 60 MG/0.6ML IJ SOSY
50.0000 mg | PREFILLED_SYRINGE | INTRAMUSCULAR | Status: DC
  Administered 2024-02-19 – 2024-02-20 (×2): 50 mg via SUBCUTANEOUS
  Filled 2024-02-19 (×2): qty 0.6

## 2024-02-19 NOTE — Progress Notes (Signed)
 "          PROGRESS NOTE  Nathan Stewart FMW:[id number] DOB: 1955/06/17 DOA: 02/16/2024 PCP: [name], MD  Brief History:  68 y.o. 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 He states the pain resolved, but for the last week he has been having nausea and unable to eat. He has been drinking though and able to keep it down. Pt was found to have acute cholecystitis (no stones seen on CT) on a CT scan. His WBC 18.0 in ED. General surgery consulted and pt taken to surgery for lap chole on 02/17/24.    Today, pt still with abdominal pain, distention.    Assessment/Plan: Acute cholecystitis s/p lap chole on 12/23, noted necrotic gallbladder and cystic duct (encountered a bleeding vessel controlled with a clip during surgery) ??Biliary leak Elevated LFTs s/p lap chole ?Post op ileus Currently afebrile, worsening leukocytosis Noted worsening LFTs, including T. 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 intake and dehydration Continue  IV fluids           Consultants:  general surgery  Code Status:  FULL   DVT Prophylaxis:  SCD   Procedures: As Listed in Progress Note Above  Antibiotics: Zosyn  12/23>>   Objective: Vitals:   02/18/24 2248 02/19/24 0008 02/19/24 0442 02/19/24 1328  BP:  (!) 146/78 117/66 (!) 156/83  Pulse:  93 88 95  Resp:  15 15 18   Temp: 100.1 F (37.8 C) (!) 100.8 F (38.2 C) 98 F (36.7 C) 99.8 F (37.7 C)  TempSrc: Oral Oral Oral Oral  SpO2:  94% 93% 94%  Weight:      Height:        Intake/Output Summary (Last 24 hours) at 02/19/2024 1539 Last data filed at 02/19/2024 1019 Gross per 24 hour  Intake 328.74 ml  Output 1060 ml  Net -731.26 ml   Weight change:  Exam: General: NAD  Cardiovascular: S1, S2 present Respiratory: CTAB Abdomen: Soft, tender, distended, no bowel sounds Musculoskeletal: No bilateral pedal edema noted Skin: Normal Psychiatry: Normal mood     Data Reviewed: I have personally reviewed following labs and imaging studies Basic Metabolic Panel: Recent Labs  Lab 02/13/24 1722 02/16/24 1720 02/17/24 0042 02/18/24 0454 02/19/24 0406  NA 130* 129* 131* 132* 134*  K 3.3* 3.0* 3.3* 3.7 3.8  CL 89* 87* 91* 95* 98  CO2 20 26 26 25 23   GLUCOSE 120* 112* 108* 147* 131*  BUN 14 12 12 11 17   CREATININE 1.00 0.88 0.81 0.75 0.87  CALCIUM 9.8 10.8* 10.1 9.2 9.1  MG  --   --  2.2  --   --   PHOS  --   --  3.5  --   --    Liver Function Tests: Recent Labs  Lab 02/13/24 1722 02/16/24 1720 02/17/24 0042 02/18/24 0454 02/19/24 0406  AST 20 29 37 302* 118*  ALT 29 34 40 414* 333*  ALKPHOS 100 122 261* 467* 369*  BILITOT 1.4* 1.1 1.2 5.0* 3.8*  PROT 7.4 8.0 8.1 6.9 7.1  ALBUMIN  4.7 4.1 4.0 3.6 3.5   Recent Labs  Lab 02/13/24 1722 02/16/24 1720  LIPASE 16 30   No results for input(s): AMMONIA in the last 168 hours. Coagulation Profile: No results for input(s): INR, PROTIME in the last 168 hours. CBC: Recent Labs  Lab  02/13/24 1722 02/16/24 1720 02/17/24 0042 02/18/24 0454 02/18/24 1553 02/19/24 0406  WBC 24.4* 18.0* 16.3* 17.5* 19.6* 20.8*  NEUTROABS 21.5*  --   --  15.1*  --   --   HGB 14.8 13.5 12.9* 12.0* 12.1* 11.6*  HCT 42.8 38.6* 37.4* 34.7* 35.6* 34.7*  MCV 88 85.0 85.6 86.8 87.5 88.3  PLT 234 273 280 261 339 334   Cardiac Enzymes: Recent Labs  Lab 02/17/24 0043  CKTOTAL 228   BNP: Invalid input(s): POCBNP CBG: Recent Labs  Lab 02/18/24 1521  GLUCAP 182*   HbA1C: No results for input(s): HGBA1C in the last 72 hours. Urine analysis:    Component Value Date/Time   COLORURINE YELLOW 02/16/2024 1840   APPEARANCEUR CLEAR 02/16/2024 1840   LABSPEC 1.007 02/16/2024 1840   PHURINE 7.0 02/16/2024 1840   GLUCOSEU NEGATIVE 02/16/2024 1840   HGBUR SMALL (A) 02/16/2024 1840   HGBUR trace-lysed 03/20/2010 0814   BILIRUBINUR NEGATIVE 02/16/2024 1840   BILIRUBINUR small (A) 02/13/2024 1641   BILIRUBINUR n 06/07/2013 1107   KETONESUR NEGATIVE 02/16/2024 1840   PROTEINUR NEGATIVE 02/16/2024 1840   UROBILINOGEN 1.0 02/13/2024 1641   UROBILINOGEN 0.2 11/16/2014 0907   NITRITE NEGATIVE 02/16/2024 1840   LEUKOCYTESUR NEGATIVE 02/16/2024 1840   Sepsis Labs: @LABRCNTIP (procalcitonin:4,lacticidven:4) ) Recent Results (from the past 240 hours)  Urine Culture     Status: None   Collection Time: 02/13/24  5:05 PM   Specimen: Urine, Clean Catch  Result Value Ref Range Status   Specimen Description   Final    URINE, CLEAN CATCH Performed at [facility], [address] [place], [place] [address]    Special Requests   Final    NONE Performed at [facility], [address], [place], [place] [address]    Culture   Final    NO GROWTH Performed at [facility], [address], [place], [place] [address]    Report Status 02/15/2024 FINAL  Final  Blood culture (routine x 2)     Status: None (Preliminary result)   Collection Time: 02/16/24  5:20 PM   Specimen: BLOOD  Result  Value Ref Range Status   Specimen Description   Final    BLOOD BLOOD RIGHT HAND Performed at [facility], [address], [place], [place] [address]    Special Requests   Final    BOTTLES DRAWN AEROBIC AND ANAEROBIC Blood Culture adequate volume Performed at [facility], [address], [place], [place] [address]    Culture   Final    NO GROWTH 2 DAYS Performed at [facility], [address], [place], [place] [address]    Report Status PENDING  Incomplete  Resp panel by RT-PCR (RSV, Flu  A&B, Covid) Anterior Nasal Swab     Status: None   Collection Time: 02/16/24  5:20 PM   Specimen: Anterior Nasal Swab  Result Value Ref Range Status   SARS Coronavirus 2 by RT PCR NEGATIVE NEGATIVE Final    Comment: (NOTE) SARS-CoV-2 target nucleic acids are NOT DETECTED.  The SARS-CoV-2 RNA is generally detectable in upper respiratory specimens during the acute phase of infection. The lowest concentration of SARS-CoV-2 viral copies this assay can detect is 138 copies/mL. A negative result does not preclude SARS-Cov-2 infection and should not be used as the sole basis for treatment or other patient management decisions. A negative result may occur with  improper specimen collection/handling, submission of specimen other than nasopharyngeal swab, presence of viral mutation(s) within the areas targeted by this assay, and inadequate number of viral copies(<138 copies/mL). A negative result must be combined with clinical observations, patient history, and epidemiological information. The expected result is Negative.  Fact Sheet for Patients:  bloggercourse.com  Fact Sheet for Healthcare Providers:  seriousbroker.it  This test is no t yet approved or cleared by the [place]  FDA and  has been authorized for detection and/or diagnosis of SARS-CoV-2 by FDA under an Emergency Use Authorization  (EUA). This EUA will remain  in effect (meaning this test can be used) for the duration of the COVID-19 declaration under Section 564(b)(1) of the Act, 21 U.S.C.section 360bbb-3(b)(1), unless the authorization is terminated  or revoked sooner.       Influenza A by PCR NEGATIVE NEGATIVE Final   Influenza B by PCR NEGATIVE NEGATIVE Final    Comment: (NOTE) The Xpert Xpress SARS-CoV-2/FLU/RSV plus assay is intended as an aid in the diagnosis of influenza from Nasopharyngeal swab specimens and should not be used as a sole basis for treatment. Nasal washings and aspirates are unacceptable for Xpert Xpress SARS-CoV-2/FLU/RSV testing.  Fact Sheet for Patients: bloggercourse.com  Fact Sheet for Healthcare Providers: seriousbroker.it  This test is not yet approved or cleared by the [place]  FDA and has been authorized for detection and/or diagnosis of SARS-CoV-2 by FDA under an Emergency Use Authorization (EUA). This EUA will remain in effect (meaning this test can be used) for the duration of the COVID-19 declaration under Section 564(b)(1) of the Act, 21 U.S.C. section 360bbb-3(b)(1), unless the authorization is terminated or revoked.     Resp Syncytial Virus by PCR NEGATIVE NEGATIVE Final    Comment: (NOTE) Fact Sheet for Patients: bloggercourse.com  Fact Sheet for Healthcare Providers: seriousbroker.it  This test is not yet approved or cleared by the [place]  FDA and has been authorized for detection and/or diagnosis of SARS-CoV-2 by FDA under an Emergency Use Authorization (EUA). This EUA will remain in effect (meaning this test can be used) for the duration of the COVID-19 declaration under Section 564(b)(1) of the Act, 21 U.S.C. section 360bbb-3(b)(1), unless the authorization is terminated or revoked.  Performed at [facility], [address] [place], [place] [address]   Blood culture (routine x 2)     Status: None (Preliminary result)   Collection Time: 02/16/24  5:25 PM   Specimen: BLOOD  Result Value Ref Range Status   Specimen Description   Final    BLOOD BLOOD RIGHT FOREARM Performed at [facility], [address] [place], [place] [address]    Special Requests   Final    BOTTLES DRAWN AEROBIC AND ANAEROBIC Blood Culture adequate volume Performed at [facility], [address],  [place], [place] [address]    Culture   Final    NO GROWTH 2 DAYS Performed at [facility], [address] [place], [place] [address]    Report Status PENDING  Incomplete  Culture, blood (Routine X 2) w Reflex to ID Panel     Status: None (Preliminary result)   Collection Time: 02/19/24  7:58 AM   Specimen: BLOOD  Result Value Ref Range Status   Specimen Description   Final    BLOOD BLOOD RIGHT ARM Performed at [facility], [address], [place], [place] [address]    Special Requests   Final    BOTTLES DRAWN AEROBIC ONLY Blood Culture results may not be optimal due to an inadequate volume of blood received in culture bottles Performed at [facility], [address], [place], [place] [address]    Culture   Final    NO GROWTH <12 HOURS Performed at [facility], [address] [place], [place] [address]    Report Status PENDING  Incomplete  Culture, blood (Routine X 2) w Reflex to ID Panel     Status: None (Preliminary result)   Collection Time: 02/19/24  7:58 AM   Specimen: BLOOD  Result Value Ref Range Status   Specimen Description   Final    BLOOD BLOOD RIGHT HAND Performed at [facility], [address] [place], [place] [address]    Special Requests   Final    BOTTLES DRAWN AEROBIC ONLY Blood Culture results may not be optimal due to an inadequate volume of blood received in culture bottles Performed at  [facility], [address], [place], [place] [address]    Culture   Final    NO GROWTH <12 HOURS Performed at [facility], [address] [place], [place] [address]    Report Status PENDING  Incomplete     Scheduled Meds:  acetaminophen   1,000 mg Oral Q6H   enoxaparin  (LOVENOX ) injection  50 mg Subcutaneous Q24H   metoprolol  succinate  100 mg Oral Daily   Continuous Infusions:  sodium chloride  100 mL/hr at 02/19/24 0346   piperacillin -tazobactam (ZOSYN )  IV 3.375 g (02/19/24 1316)    Procedures/Studies: MR ABDOMEN MRCP WO CONTRAST Result Date: 02/19/2024 CLINICAL DATA:  Postop day 1 from laparoscopic cholecystectomy. Elevated LFTs. Clinical concern for retained common bile duct stone. EXAM: MRI ABDOMEN WITHOUT CONTRAST  (INCLUDING MRCP) TECHNIQUE: Multiplanar multisequence MR imaging of the abdomen was performed. Heavily T2-weighted images of the biliary and pancreatic ducts were obtained, and three-dimensional MRCP images were rendered by post processing. COMPARISON:  Abdomen and pelvis CT 02/16/2024 FINDINGS: Lower chest: Dependent atelectasis with tiny right pleural effusion. Hepatobiliary: No suspicious focal abnormality in the liver on this noncontrast study. There is some periportal edema with edema in the inferior liver around the gallbladder fossa. Heterogeneous signal intensity in the gallbladder fossa likely reflects a combination of postoperative fluid/blood products in packing material. No substantial intrahepatic biliary duct dilatation. Common duct measures 7 mm diameter in the porta hepatis. Common bile duct just proximal to the ampulla is 7 mm diameter. There is no evidence for choledocholithiasis. Pancreas: No focal mass lesion. No dilatation of the main duct. No intraparenchymal cyst. No peripancreatic edema. Edema around the duodenum and pancreatic head is 2 compatible with recent surgery. Spleen:  No splenomegaly. No suspicious focal mass lesion.  Adrenals/Urinary Tract: No adrenal nodule or mass. Bilateral renal cysts evident. Stomach/Bowel:  Stomach is moderately distended with fluid. Small bowel and colon shows mild diffuse gaseous distension. Vascular/Lymphatic: No abdominal aortic aneurysm small retroperitoneal lymph nodes are similar to recent CT scan. Other: Edema in the anterior abdominal wall is compatible with the recent surgical access. There is dependent subcutaneous body wall edema bilaterally bilaterally. Musculoskeletal: No overtly suspicious marrow signal abnormality. IMPRESSION: 1. Postop day 1 from cholecystectomy. Heterogeneous signal intensity in the gallbladder fossa likely reflects a combination of postoperative fluid/blood products and packing material. 2. No substantial intrahepatic biliary duct dilatation. Common duct measures 7 mm diameter in the porta hepatis and just proximal to the ampulla. No evidence for choledocholithiasis. 3. Mild diffuse gaseous distension of the small bowel and colon compatible with mild postoperative ileus. 4. Dependent atelectasis with tiny right pleural effusion. Electronically Signed   By: Camellia Candle M.D.   On: 02/19/2024 05:32   DG Abd Portable 1V Result Date: 02/18/2024 CLINICAL DATA:  Distension postop cholecystostomy EXAM: PORTABLE ABDOMEN - 1 VIEW COMPARISON:  CT 02/16/2024 FINDINGS: Postop changes in the right upper quadrant with clips and probable drainage catheter. Mild increased small and large bowel gas without obstruction. Moderate stool IMPRESSION: Mild increased small and large bowel gas without obstruction, possible mild ileus. Moderate stool. Electronically Signed   By: Luke Bun M.D.   On: 02/18/2024 15:59   DG Chest Port 1 View Result Date: 02/18/2024 CLINICAL DATA:  Shortness of breath EXAM: PORTABLE CHEST 1 VIEW COMPARISON:  02/16/2024 FINDINGS: Hypoventilatory change. Minimal atelectasis right base. No consolidation, pleural effusion or pneumothorax. Normal cardiac size.  Aortic atherosclerosis. Left shoulder replacement IMPRESSION: Hypoventilatory changes with minimal atelectasis at the right base. Electronically Signed   By: Luke Bun M.D.   On: 02/18/2024 15:58   CT ABDOMEN PELVIS W CONTRAST Result Date: 02/16/2024 EXAM: CT ABDOMEN AND PELVIS WITH CONTRAST 02/16/2024 07:57:25 PM TECHNIQUE: CT of the abdomen and pelvis was performed with the administration of 100 mL of iohexol  (OMNIPAQUE ) 350 MG/ML injection. Multiplanar reformatted images are provided for review. Automated exposure control, iterative reconstruction, and/or weight-based adjustment of the mA/kV was utilized to reduce the radiation dose to as low as reasonably achievable. COMPARISON: None available. CLINICAL HISTORY: Epigastric pain; cp, epigastric abd pain, leukocytosis. FINDINGS: LOWER CHEST: No acute abnormality. LIVER: Hepatic steatosis. GALLBLADDER AND BILE DUCTS: Distended gallbladder with wall thickening and pericholecystic fluid compatible with acute cholecystitis. No radiopaque stone or biliary ductal dilatation. SPLEEN: No acute abnormality. PANCREAS: No acute abnormality. ADRENAL GLANDS: No acute abnormality. KIDNEYS, URETERS AND BLADDER: No stones in the kidneys or ureters. No hydronephrosis. No perinephric or periureteral stranding. Urinary bladder is unremarkable. GI AND BOWEL: Stomach demonstrates no acute abnormality. Colonic diverticulosis without evidence of diverticulitis. There is no bowel obstruction. PERITONEUM AND RETROPERITONEUM: No ascites. No free air. VASCULATURE: Aorta is normal in caliber. Aortic atherosclerotic calcification. LYMPH NODES: No lymphadenopathy. REPRODUCTIVE ORGANS: No acute abnormality. BONES AND SOFT TISSUES: No acute osseous abnormality. No focal soft tissue abnormality. IMPRESSION: 1. Acute cholecystitis. Electronically signed by: Norman Gatlin MD 02/16/2024 08:10 PM EST RP Workstation: [id number]   CT Angio Chest PE W and/or Wo Contrast Result Date:  02/16/2024 EXAM: CTA CHEST 02/16/2024 07:57:25 PM TECHNIQUE: CTA of the chest was performed with the administration of 100 mL of iohexol  (OMNIPAQUE ) 350 MG/ML injection. Multiplanar reformatted images are provided for review. MIP images are provided for review. Automated exposure control, iterative reconstruction, and/or weight based adjustment of the mA/kV was utilized to reduce the radiation dose to as low as  reasonably achievable. COMPARISON: Comparison with the same day x-ray. CLINICAL HISTORY: Pulmonary embolism (PE) suspected, high prob; cough, cp, abd pain. FINDINGS: PULMONARY ARTERIES: Pulmonary arteries are adequately opacified for evaluation. No acute pulmonary embolus. Main pulmonary artery is normal in caliber. MEDIASTINUM: Coronary artery and aortic atherosclerotic calcification. The heart and pericardium demonstrate no acute abnormality. There is no acute abnormality of the thoracic aorta. LYMPH NODES: No mediastinal, hilar or axillary lymphadenopathy. LUNGS AND PLEURA: Emphysema. Right lower lobe atelectasis. No evidence of pleural effusion or pneumothorax. UPPER ABDOMEN: Hepatic steatosis. SOFT TISSUES AND BONES: No acute bone or soft tissue abnormality. IMPRESSION: 1. No pulmonary embolism. 2. Emphysema. Pulmonary emphysema is an independent risk factor for lung cancer. Recommend consideration for evaluation for a low-dose CT lung cancer screening program. Electronically signed by: Norman Gatlin MD 02/16/2024 08:07 PM EST RP Workstation: [id number]   DG Chest Portable 1 View Result Date: 02/16/2024 CLINICAL DATA:  Cough. EXAM: PORTABLE CHEST 1 VIEW COMPARISON:  05/11/2021. FINDINGS: The heart size and mediastinal contours are within normal limits. There is elevation of the right diaphragm. Atelectasis is noted at the lung bases bilaterally. No effusion or pneumothorax is seen. 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 IMPRESSION: Elevation of the right diaphragm with atelectasis at the  lung bases. Electronically Signed   By: Leita Birmingham M.D.   On: 02/16/2024 17:48    Lebron JINNY Cage, MD  [facility]  If 7PM-7AM, please contact night-coverage [email or web address] 02/19/2024, 3:39 PM   LOS: 3 days   "

## 2024-02-19 NOTE — Plan of Care (Signed)

## 2024-02-19 NOTE — Consult Note (Signed)
 [facility] Gastroenterology Consult  Referring Provider: Dr. Camellia Wilson/CCS Primary Care Physician:  [name], MD Primary Gastroenterologist: Doree Fortune, MD, Zelda Salmon  Reason for Consultation: Suspected bile leak  HPI: Nathan Stewart is a 68 y.o. male underwent laparoscopic cholecystectomy on 02/17/2024 for severe gangrenous acute calculous cholecystitis, was noted to have elevation in liver enzymes, subsequently had MRCP which did not show choledocholithiasis however drain appears to be bilious suspicious for bile leak.  Today, drain appears frankly bilious.  Patient complains of abdominal distention, indigestion, inability to pass flatus.  Patient denies recent unintentional weight loss, loss of appetite, change in bowel habits, noticing blood in stool, black stools, difficulty swallowing or pain on swallowing.  Previous GI workup: Colonoscopy Toribio Fortune 05/09/2023, surveillance: 5 polyps removed from transverse and ascending, sigmoid diverticulosis, internal hemorrhoids, polyps reported as tubular adenomas  Colonoscopy, Dr. Avram, 03/28/2016, screening : 1 tubular adenoma removed from transverse colon, sigmoid diverticulosis  Colonoscopy, Dr. Avram, 2007, screening: Unremarkable, repeat recommended in 10 years.  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   GEN OSTEOARTHROSIS INVOLVING MULTIPLE SITES 11/10/2007   GERD 01/01/2007   Hx of adenomatous polyp of colon 04/04/2016   Hypertension    INSOMNIA, CHRONIC 11/13/2009   LIVER FUNCTION TESTS, ABNORMAL 08/27/2007   LOC OSTEOARTHROS NOT SPEC PRIM/SEC LOWER LEG 11/10/2007   Memory loss 08/27/2007   MENISCUS TEAR 10/30/2006   MSSA (methicillin susceptible Staphylococcus aureus) infection 04/29/2023   OSTEOARTHRITIS 06/07/2008   Pneumonia    Septic  arthritis of shoulder, left (HCC) 09/16/2023   SLEEP APNEA 09/20/2009   no cpap   TESTICULAR HYPOFUNCTION 08/14/2009    Past Surgical History:  Procedure Laterality Date   CHOLECYSTECTOMY N/A 02/17/2024   Procedure: LAPAROSCOPIC CHOLECYSTECTOMY;  Surgeon: Tanda Camellia, MD;  Location: [facility];  Service: General;  Laterality: N/A;  ICG, possible IOC   COLONOSCOPY     COLONOSCOPY WITH PROPOFOL  N/A 05/09/2023   Procedure: COLONOSCOPY WITH PROPOFOL ;  Surgeon: Fortune Angelia Toribio, MD;  [facility]: [facility];  Service: Gastroenterology;  Laterality: N/A;  10:45AM;ASA 1   HAND SURGERY  over 20 years ago   'metal plate in wrist'   IRRIGATION AND DEBRIDEMENT SHOULDER Left 04/03/2023   Procedure: IRRIGATION AND DEBRIDEMENT SHOULDER;  Surgeon: Dozier Soulier, MD;  Location: [facility];  Service: Orthopedics;  Laterality: Left;   JOINT REPLACEMENT N/A    Phreesia 10/19/2019   MULTIPLE TOOTH EXTRACTIONS     POLYPECTOMY  05/09/2023   Procedure: POLYPECTOMY;  Surgeon: Fortune Angelia Toribio, MD;  Location: [facility];  Service: Gastroenterology;;   SHOULDER SURGERY Right 2021   ligament damage   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   Procedure: TOTAL KNEE ARTHROPLASTY;  Surgeon: Dempsey Sensor, MD;  Location: [facility] OR;  Service: Orthopedics;  Laterality: Left;   TOTAL KNEE ARTHROPLASTY Right 05/21/2021   Procedure: RIGHT TOTAL KNEE ARTHROPLASTY;  Surgeon: Sensor Dempsey, MD;  Location: [facility];  Service: Orthopedics;  Laterality: Right;   TOTAL SHOULDER REVISION Left 03/11/2023   Procedure: LEFT SHOULDER REVISION GLENOID COMPONENT;  Surgeon: Dozier Soulier, MD;  Location: [facility];  Service: Orthopedics;  Laterality: Left;   TOTAL SHOULDER REVISION Left 04/03/2023   Procedure: SHOULDER REVISION;  Surgeon: Dozier Soulier, MD;  Location: [facility];  Service: Orthopedics;  Laterality: Left;    Prior to Admission medications  Medication Sig  Start Date End Date Taking? Authorizing Provider  ALPRAZolam   (XANAX ) 0.5 MG tablet Take 1 tablet (0.5 mg total) by mouth 3 (three) times daily as needed for anxiety. 11/11/23  Yes [name], MD  amLODipine  (NORVASC ) 5 MG tablet TAKE 1 TABLET(5 MG) BY MOUTH DAILY 12/15/23  Yes [name], MD  cefadroxil  (DURICEF) 500 MG capsule Take 2 capsules (1,000 mg total) by mouth 2 (two) times daily. 04/30/23  Yes Fleeta Rothman, Jomarie SAILOR, MD  ELDERBERRY PO Take 1 tablet by mouth daily.   Yes [provider]  Garlic 1000 MG CAPS Take 1,000 mg by mouth daily.   Yes [provider]  hydrochlorothiazide  (HYDRODIURIL ) 25 MG tablet TAKE 1 TABLET(25 MG) BY MOUTH DAILY Patient taking differently: Take 25 mg by mouth daily. 09/18/23  Yes [name], MD  Magnesium 250 MG TABS Take 250 mg by mouth daily.   Yes [provider]  metoprolol  succinate (TOPROL -XL) 100 MG 24 hr tablet TAKE 1 TABLET BY MOUTH EVERY DAY WITH OR IMMEDIATELY FOLLOWING A MEAL 12/16/23  Yes [name], MD  Multiple Vitamin (MULTIVITAMIN) tablet Take 1 tablet by mouth daily. Adult 50+   Yes [provider]  Omega-3 Fatty Acids (FISH OIL) 1000 MG CPDR Take 2,000 mg by mouth daily.   Yes [provider]  vitamin C (ASCORBIC ACID) 250 MG tablet Take 250 mg by mouth daily.   Yes [provider]    Current Facility-Administered Medications  Medication Dose Route Frequency Provider Last Rate Last Admin   0.9 %  sodium chloride  infusion   Intravenous Continuous Ezenduka, Nkeiruka J, MD 100 mL/hr at 02/19/24 0346 Infusion Verify at 02/19/24 0346   acetaminophen  (TYLENOL ) tablet 1,000 mg  1,000 mg Oral Q6H Tanda Locus, MD   1,000 mg at 02/19/24 0546   ALPRAZolam  (XANAX ) tablet 0.5 mg  0.5 mg Oral TID PRN Tanda Locus, MD       diphenhydrAMINE  (BENADRYL ) 12.5 MG/5ML elixir 12.5 mg  12.5 mg Oral Q6H PRN Tanda Locus, MD       Or   diphenhydrAMINE  (BENADRYL ) injection 12.5 mg  12.5 mg Intravenous Q6H PRN Tanda Locus, MD       enoxaparin   (LOVENOX ) injection 50 mg  50 mg Subcutaneous Q24H Tanda Locus, MD       fentaNYL  (SUBLIMAZE ) injection 25-50 mcg  25-50 mcg Intravenous Q2H PRN Tanda Locus, MD   50 mcg at 02/18/24 1443   lip balm (CARMEX) ointment   Topical PRN Curvin Deward MOULD, MD   Given at 02/18/24 1709   metoprolol  succinate (TOPROL -XL) 24 hr tablet 100 mg  100 mg Oral Daily Tanda Locus, MD   100 mg at 02/19/24 1102   ondansetron  (ZOFRAN ) tablet 4 mg  4 mg Oral Q6H PRN Tanda Locus, MD       Or   ondansetron  (ZOFRAN ) injection 4 mg  4 mg Intravenous Q6H PRN Tanda Locus, MD       piperacillin -tazobactam (ZOSYN ) IVPB 3.375 g  3.375 g Intravenous Q8H Wilson, Eric, MD 12.5 mL/hr at 02/19/24 0455 3.375 g at 02/19/24 0455   simethicone  (MYLICON) chewable tablet 40 mg  40 mg Oral Q6H PRN Tanda Locus, MD   40 mg at 02/18/24 2053   traMADol  (ULTRAM ) tablet 50 mg  50 mg Oral Q6H PRN Tanda Locus, MD   50 mg at 02/18/24 0544    Allergies as of 02/16/2024 - Review Complete 02/16/2024  Allergen Reaction Noted   Hydrocodone -acetaminophen   Nausea And Vomiting 09/18/2022   Irbesartan   02/04/2014   Percocet [oxycodone -acetaminophen ] Nausea And Vomiting 06/14/2013   Shingrix [zoster vac recomb adjuvanted] Itching 05/08/2021    Family History  Problem Relation Age of Onset   Arthritis Mother    Heart disease Mother    Hypertension Mother    Hypertension Father    Stroke Father    Hypertension Sister    Stroke Brother    Colon cancer Paternal Uncle     Social History   Socioeconomic History   Marital status: Married    Spouse name: Not on file   Number of children: Not on file   Years of education: Not on file   Highest education level: Not on file  Occupational History   Not on file  Tobacco Use   Smoking status: Former    Current packs/day: 0.00    Types: Cigarettes    Quit date: 02/25/1997    Years since quitting: 27.0    Passive exposure: Current   Smokeless tobacco: Never   Tobacco comments:    quit 1999   Vaping Use   Vaping status: Never Used  Substance and Sexual Activity   Alcohol use: Yes    Alcohol/week: 6.0 standard drinks of alcohol    Types: 6 Cans of beer per week    Comment: occasional   Drug use: No   Sexual activity: Yes  Other Topics Concern   Not on file  Social History Narrative   Not on file   Social Drivers of Health   Tobacco Use: Medium Risk (02/17/2024)   Patient History    Smoking Tobacco Use: Former    Smokeless Tobacco Use: Never    Passive Exposure: Current  Physicist, Medical Strain: Low Risk (12/05/2023)   Received from [facility]   Overall Financial Resource Strain (CARDIA)    How hard is it for you to pay for the very basics like food, housing, medical care, and heating?: Not hard at all  Food Insecurity: No Food Insecurity (02/16/2024)   Epic    Worried About Radiation Protection Practitioner of Food in the Last Year: Never true    Ran Out of Food in the Last Year: Never true  Transportation Needs: No Transportation Needs (02/16/2024)   Epic    Lack of Transportation (Medical): No    Lack of Transportation (Non-Medical): No  Physical Activity: Sufficiently Active (12/05/2023)   Received from [facility]   Exercise Vital Sign    On average, how many days per week do you engage in moderate to strenuous exercise (like a brisk walk)?: 3 days    On average, how many minutes do you engage in exercise at this level?: 60 min  Recent Concern: Physical Activity - Inactive (12/04/2023)   Exercise Vital Sign    Days of Exercise per Week: 0 days    Minutes of Exercise per Session: 0 min  Stress: No Stress Concern Present (12/05/2023)   Received from [facility] - Occupational Stress Questionnaire    Do you feel stress - tense, restless, nervous, or anxious, or unable to sleep at night because your mind is troubled all the time - these days?: Not at all  Social Connections: Socially Integrated (02/16/2024)   Social Connection  and Isolation Panel    Frequency of Communication with Friends and Family: More than three times a week    Frequency of Social Gatherings with Friends and Family: Three times a week  Attends Religious Services: More than 4 times per year    Active Member of Clubs or Organizations: Yes    Attends Banker Meetings: More than 4 times per year    Marital Status: Married  Catering Manager Violence: Not At Risk (02/16/2024)   Epic    Fear of Current or Ex-Partner: No    Emotionally Abused: No    Physically Abused: No    Sexually Abused: No  Depression (PHQ2-9): Low Risk (01/27/2024)   Depression (PHQ2-9)    PHQ-2 Score: 0  Alcohol Screen: Low Risk (12/04/2023)   Alcohol Screen    Last Alcohol Screening Score (AUDIT): 0  Housing: Low Risk (02/16/2024)   Epic    Unable to Pay for Housing in the Last Year: No    Number of Times Moved in the Last Year: 0    Homeless in the Last Year: No  Utilities: Not At Risk (02/16/2024)   Epic    Threatened with loss of utilities: No  Health Literacy: Adequate Health Literacy (12/04/2023)   B1300 Health Literacy    Frequency of need for help with medical instructions: Never    Review of Systems: As per HPI  Physical Exam: Vital signs in last 24 hours: Temp:  [97.7 F (36.5 C)-101 F (38.3 C)] 98 F (36.7 C) (12/25 0442) Pulse Rate:  [86-132] 88 (12/25 0442) Resp:  [15-36] 15 (12/25 0442) BP: (117-171)/(66-114) 117/66 (12/25 0442) SpO2:  [92 %-98 %] 93 % (12/25 0442) Last BM Date : 02/11/24  General:   Alert,  Well-developed, well-nourished, pleasant and cooperative in NAD Head:  Normocephalic and atraumatic. Eyes: Mild icterus.   Conjunctiva pink. Ears:  Normal auditory acuity. Nose:  No deformity, discharge,  or lesions. Mouth:  No deformity or lesions.  Oropharynx pink & moist. Neck:  Supple; no masses or thyromegaly. Lungs:  Clear throughout to auscultation.   No wheezes, crackles, or rhonchi. No acute distress. Heart:   Regular rate and rhythm; no murmurs, clicks, rubs,  or gallops. Extremities:  Without clubbing or edema. Neurologic:  Alert and  oriented x4;  grossly normal neurologically. Skin:  Intact without significant lesions or rashes. Psych:  Alert and cooperative. Normal mood and affect. Abdomen: Distended abdomen, bowel sounds not audible, right upper quadrant drain with dark green bilious fluid         Lab Results: Recent Labs    02/18/24 0454 02/18/24 1553 02/19/24 0406  WBC 17.5* 19.6* 20.8*  HGB 12.0* 12.1* 11.6*  HCT 34.7* 35.6* 34.7*  PLT 261 339 334   BMET Recent Labs    02/17/24 0042 02/18/24 0454 02/19/24 0406  NA 131* 132* 134*  K 3.3* 3.7 3.8  CL 91* 95* 98  CO2 26 25 23   GLUCOSE 108* 147* 131*  BUN 12 11 17   CREATININE 0.81 0.75 0.87  CALCIUM 10.1 9.2 9.1   LFT Recent Labs    02/19/24 0406  PROT 7.1  ALBUMIN  3.5  AST 118*  ALT 333*  ALKPHOS 369*  BILITOT 3.8*   PT/INR No results for input(s): LABPROT, INR in the last 72 hours.  Studies/Results: MR ABDOMEN MRCP WO CONTRAST Result Date: 02/19/2024 CLINICAL DATA:  Postop day 1 from laparoscopic cholecystectomy. Elevated LFTs. Clinical concern for retained common bile duct stone. EXAM: MRI ABDOMEN WITHOUT CONTRAST  (INCLUDING MRCP) TECHNIQUE: Multiplanar multisequence MR imaging of the abdomen was performed. Heavily T2-weighted images of the biliary and pancreatic ducts were obtained, and three-dimensional MRCP images were rendered by post  processing. COMPARISON:  Abdomen and pelvis CT 02/16/2024 FINDINGS: Lower chest: Dependent atelectasis with tiny right pleural effusion. Hepatobiliary: No suspicious focal abnormality in the liver on this noncontrast study. There is some periportal edema with edema in the inferior liver around the gallbladder fossa. Heterogeneous signal intensity in the gallbladder fossa likely reflects a combination of postoperative fluid/blood products in packing material. No substantial  intrahepatic biliary duct dilatation. Common duct measures 7 mm diameter in the porta hepatis. Common bile duct just proximal to the ampulla is 7 mm diameter. There is no evidence for choledocholithiasis. Pancreas: No focal mass lesion. No dilatation of the main duct. No intraparenchymal cyst. No peripancreatic edema. Edema around the duodenum and pancreatic head is 2 compatible with recent surgery. Spleen:  No splenomegaly. No suspicious focal mass lesion. Adrenals/Urinary Tract: No adrenal nodule or mass. Bilateral renal cysts evident. Stomach/Bowel: Stomach is moderately distended with fluid. Small bowel and colon shows mild diffuse gaseous distension. Vascular/Lymphatic: No abdominal aortic aneurysm small retroperitoneal lymph nodes are similar to recent CT scan. Other: Edema in the anterior abdominal wall is compatible with the recent surgical access. There is dependent subcutaneous body wall edema bilaterally bilaterally. Musculoskeletal: No overtly suspicious marrow signal abnormality. IMPRESSION: 1. Postop day 1 from cholecystectomy. Heterogeneous signal intensity in the gallbladder fossa likely reflects a combination of postoperative fluid/blood products and packing material. 2. No substantial intrahepatic biliary duct dilatation. Common duct measures 7 mm diameter in the porta hepatis and just proximal to the ampulla. No evidence for choledocholithiasis. 3. Mild diffuse gaseous distension of the small bowel and colon compatible with mild postoperative ileus. 4. Dependent atelectasis with tiny right pleural effusion. Electronically Signed   By: Camellia Candle M.D.   On: 02/19/2024 05:32   DG Abd Portable 1V Result Date: 02/18/2024 CLINICAL DATA:  Distension postop cholecystostomy EXAM: PORTABLE ABDOMEN - 1 VIEW COMPARISON:  CT 02/16/2024 FINDINGS: Postop changes in the right upper quadrant with clips and probable drainage catheter. Mild increased small and large bowel gas without obstruction. Moderate  stool IMPRESSION: Mild increased small and large bowel gas without obstruction, possible mild ileus. Moderate stool. Electronically Signed   By: Luke Bun M.D.   On: 02/18/2024 15:59   DG Chest Port 1 View Result Date: 02/18/2024 CLINICAL DATA:  Shortness of breath EXAM: PORTABLE CHEST 1 VIEW COMPARISON:  02/16/2024 FINDINGS: Hypoventilatory change. Minimal atelectasis right base. No consolidation, pleural effusion or pneumothorax. Normal cardiac size. Aortic atherosclerosis. Left shoulder replacement IMPRESSION: Hypoventilatory changes with minimal atelectasis at the right base. Electronically Signed   By: Luke Bun M.D.   On: 02/18/2024 15:58    Impression: Suspected bile leak status post cholecystectomy for severe gangrenous calculous cholecystitis Biliary drain amount: 150 on 02/19/2024 515 on 02/18/2024 110 on 02/17/2024  Elevated LFTs T. bili/AST/ALT/ALP 3.8/118/333/369 from 5/302/41/467  MRCP shows no intrahepatic bile duct dilatation, CBD 7 mm at porta hepatis and just proximal to ampulla without evidence of choledocholithiasis  Postoperative ileus: Diffuse gaseous distention of small bowel and colon  Leukocytosis, WBC 20.8  Comorbidities: Hypertension, MSSA infection of left shoulder on antibiotic suppression, sleep apnea, bilateral knee replacement, GERD  Plan: Discussed with Dr. Tanda. He plans on performing HIDA tomorrow(has been ordered by surgical team) to identify site of bile leak?  Cystic duct versus duct of Luschka. If bile leak continues to be significant, will need ERCP with stent placement.  Ileus needs to be monitored as patient has significant abdominal distention on clinical exam. Recommend repeating abdominal x-ray  in a.m..   LOS: 3 days   Estelita Manas, MD  02/19/2024, 11:42 AM

## 2024-02-19 NOTE — Progress Notes (Addendum)
 Patient ID: Nathan Stewart, male   DOB: 04-03-1955, 68 y.o.   MRN: [id number]   Acute Care Surgery Service Progress Note:    Chief Complaint/Subjective: No bm Abd feels a little better than yesterday No flatus Wife at [facility], daughter on phone Drain now frankly bilious  Objective: Vital signs in last 24 hours: Temp:  [97.7 F (36.5 C)-101 F (38.3 C)] 98 F (36.7 C) (12/25 0442) Pulse Rate:  [86-132] 88 (12/25 0442) Resp:  [15-36] 15 (12/25 0442) BP: (117-171)/(66-114) 117/66 (12/25 0442) SpO2:  [92 %-98 %] 93 % (12/25 0442) Last BM Date : 02/11/24  Intake/Output from previous day: 12/24 0701 - 12/25 0700 In: 970.9 [I.V.:842.9; IV Piggyback:128] Out: 1515 [Urine:1000; Drains:515] Intake/Output this shift: No intake/output data recorded.  Lungs: nonlabored  Cardiovascular: reg  Abd: soft, obese, mild distension, incisions ok, JP - frankly bilious  Extremities: no edema, +SCDs  Neuro: alert, nonfocal  Lab Results: CBC  Recent Labs    02/18/24 1553 02/19/24 0406  WBC 19.6* 20.8*  HGB 12.1* 11.6*  HCT 35.6* 34.7*  PLT 339 334   BMET Recent Labs    02/18/24 0454 02/19/24 0406  NA 132* 134*  K 3.7 3.8  CL 95* 98  CO2 25 23  GLUCOSE 147* 131*  BUN 11 17  CREATININE 0.75 0.87  CALCIUM 9.2 9.1   LFT    Latest Ref Rng & Units 02/19/2024    4:06 AM 02/18/2024    4:54 AM 02/17/2024   12:42 AM  Hepatic Function  Total Protein 6.5 - 8.1 g/dL 7.1  6.9  8.1   Albumin  3.5 - 5.0 g/dL 3.5  3.6  4.0   AST 15 - 41 U/L 118  302  37   ALT 0 - 44 U/L 333  414  40   Alk Phosphatase 38 - 126 U/L 369  467  261   Total Bilirubin 0.0 - 1.2 mg/dL 3.8  5.0  1.2    PT/INR No results for input(s): LABPROT, INR in the last 72 hours. ABG No results for input(s): PHART, HCO3 in the last 72 hours.  Invalid input(s): PCO2, PO2  Studies/Results:  Anti-infectives: Anti-infectives (From admission, onward)    Start     Dose/Rate Route Frequency Ordered Stop    02/18/24 2000  piperacillin -tazobactam (ZOSYN ) IVPB 3.375 g        3.375 g 12.5 mL/hr over 240 Minutes Intravenous Every 8 hours 02/18/24 1400 02/23/24 1959   02/17/24 2200  piperacillin -tazobactam (ZOSYN ) IVPB 3.375 g        3.375 g 12.5 mL/hr over 240 Minutes Intravenous Every 8 hours 02/17/24 1708 02/18/24 1652   02/17/24 0600  piperacillin -tazobactam (ZOSYN ) IVPB 3.375 g  Status:  Discontinued        3.375 g 12.5 mL/hr over 240 Minutes Intravenous Every 8 hours 02/17/24 0025 02/17/24 1708   02/16/24 2145  piperacillin -tazobactam (ZOSYN ) IVPB 3.375 g        3.375 g 100 mL/hr over 30 Minutes Intravenous  Once 02/16/24 2137 02/16/24 2232       Medications: Scheduled Meds:  acetaminophen   1,000 mg Oral Q6H   metoprolol  succinate  100 mg Oral Daily   Continuous Infusions:  sodium chloride  100 mL/hr at 02/19/24 0346   piperacillin -tazobactam (ZOSYN )  IV 3.375 g (02/19/24 0455)   PRN Meds:.ALPRAZolam , diphenhydrAMINE  **OR** diphenhydrAMINE , fentaNYL  (SUBLIMAZE ) injection, lip balm, ondansetron  **OR** ondansetron  (ZOFRAN ) IV, simethicone , traMADol   Assessment/Plan: Patient Active Problem List   Diagnosis Date Noted  Hypokalemia 02/17/2024   Hyponatremia 02/17/2024   Dehydration 02/17/2024   Acute cholecystitis 02/16/2024   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, 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   Septic arthritis of shoulder, left (HCC) 09/16/2023   MSSA (methicillin susceptible Staphylococcus aureus) infection 04/29/2023   Infection of prosthetic total shoulder joint, initial encounter 04/03/2023   History of revision of total shoulder arthroplasty 03/13/2023   Metabolic dysfunction-associated steatohepatitis (MASH) 12/24/2022   Arthropathy of cervical facet joint 09/11/2022   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   DDD (degenerative disc disease), cervical 09/11/2022   Viral URI 08/28/2022   Neck pain on left side 03/12/2022    Osteoarthritis of right knee 05/18/2021   Closed fracture of one rib 04/08/2017   Contusion of chest 04/06/2017   History of colonic polyps 04/04/2016   Cataract of both eyes 09/28/2015   Retinopathy, hypertensive, both eyes 09/28/2015   Glaucoma of both eyes 09/28/2015   Primary osteoarthritis of left knee 11/27/2014   INSOMNIA, CHRONIC 11/13/2009   Sleep apnea 09/20/2009   Low testosterone  08/14/2009   Osteoarthritis 06/07/2008   GEN OSTEOARTHROSIS INVOLVING MULTIPLE SITES 11/10/2007   Elevated LFTs 08/27/2007   GERD 01/01/2007   Essential hypertension 01/01/2007   LAPAROSCOPIC CHOLECYSTECTOMY (N/A) - [facility]G, 12/23 Dr Tanda for severe gangrenous cholecystitis -difficult surgery with intra-op bleeding controlled with clips, also surgical Snow x2 and surgicell powder -LFTs bumped 12/24 but trending down -MRCP 12/24 - no choledocholithiasis -now with Bile Leak; if remains greater than 300 cc for 2 consecutive days will need HIDA and ERCP with stent; consulted [facility] today to get them on board - Dr Saintclair - she will see pt -no signs of addl bleeding, hgb stable -has brewing ileus clinically and radiographically   ID-cont zosyn  since pt has developed bile leak VTE prophylaxis - scds,  start prophylactic lovenox  since hgb remains stable; repeat CBC in am FEN - clears, pt at risk for ileus, told pt to take it slow with liquids   Discussed intra-op findings with pt and family member at [facility] and daughter on phone, discussed possibility of ileus, discussed new lab results which show LFTs trending down.  Discussed that his drain is now frankly bilious.  Discussed that we will monitor its output.  If remains high-volume then he will need additional procedures such as an ERCP with stent, if becomes low output then we continue to observe it  Disposition: see above  LOS: 3 days    Camellia HERO. Tanda, MD, FACS General, Bariatric, & Minimally Invasive Surgery [phone] [facility]  Surgery, A [facility]

## 2024-02-20 ENCOUNTER — Inpatient Hospital Stay (HOSPITAL_COMMUNITY)

## 2024-02-20 DIAGNOSIS — K810 Acute cholecystitis: Secondary | ICD-10-CM | POA: Diagnosis not present

## 2024-02-20 LAB — COMPREHENSIVE METABOLIC PANEL WITH GFR
ALT: 202 U/L — ABNORMAL HIGH (ref 0–44)
AST: 47 U/L — ABNORMAL HIGH (ref 15–41)
Albumin: 3.2 g/dL — ABNORMAL LOW (ref 3.5–5.0)
Alkaline Phosphatase: 311 U/L — ABNORMAL HIGH (ref 38–126)
Anion gap: 13 (ref 5–15)
BUN: 23 mg/dL (ref 8–23)
CO2: 24 mmol/L (ref 22–32)
Calcium: 9.2 mg/dL (ref 8.9–10.3)
Chloride: 100 mmol/L (ref 98–111)
Creatinine, Ser: 0.73 mg/dL (ref 0.61–1.24)
GFR, Estimated: >60 mL/min
Glucose, Bld: 102 mg/dL — ABNORMAL HIGH (ref 70–99)
Potassium: 3.5 mmol/L (ref 3.5–5.1)
Sodium: 137 mmol/L (ref 135–145)
Total Bilirubin: 1.8 mg/dL — ABNORMAL HIGH (ref 0.0–1.2)
Total Protein: 6.9 g/dL (ref 6.5–8.1)

## 2024-02-20 LAB — LACTIC ACID, PLASMA
Lactic Acid, Venous: 1.5 mmol/L (ref 0.5–1.9)
Lactic Acid, Venous: 1 mmol/L (ref 0.5–1.9)

## 2024-02-20 LAB — CBC
HCT: 31.8 % — ABNORMAL LOW (ref 39.0–52.0)
Hemoglobin: 10.6 g/dL — ABNORMAL LOW (ref 13.0–17.0)
MCH: 29.9 pg (ref 26.0–34.0)
MCHC: 33.3 g/dL (ref 30.0–36.0)
MCV: 89.6 fL (ref 80.0–100.0)
Platelets: 354 K/uL (ref 150–400)
RBC: 3.55 MIL/uL — ABNORMAL LOW (ref 4.22–5.81)
RDW: 12.7 % (ref 11.5–15.5)
WBC: 24.8 K/uL — ABNORMAL HIGH (ref 4.0–10.5)
nRBC: 0 % (ref 0.0–0.2)

## 2024-02-20 LAB — MRSA NEXT GEN BY PCR, NASAL: MRSA by PCR Next Gen: NOT DETECTED

## 2024-02-20 LAB — SURGICAL PATHOLOGY

## 2024-02-20 MED ORDER — HYDRALAZINE HCL 20 MG/ML IJ SOLN
10.0000 mg | Freq: Three times a day (TID) | INTRAMUSCULAR | Status: DC | PRN
  Administered 2024-02-21 – 2024-02-23 (×3): 10 mg via INTRAVENOUS
  Filled 2024-02-20 (×2): qty 1

## 2024-02-20 MED ORDER — CHLORHEXIDINE GLUCONATE CLOTH 2 % EX PADS
6.0000 | MEDICATED_PAD | Freq: Every day | CUTANEOUS | Status: DC
  Administered 2024-02-20 – 2024-02-23 (×4): 6 via TOPICAL

## 2024-02-20 MED ORDER — ALBUTEROL SULFATE (2.5 MG/3ML) 0.083% IN NEBU: 2.5000 mg | INHALATION_SOLUTION | Freq: Four times a day (QID) | RESPIRATORY_TRACT | Status: DC | PRN

## 2024-02-20 MED ORDER — LORAZEPAM 2 MG/ML IJ SOLN
0.5000 mg | Freq: Once | INTRAMUSCULAR | Status: AC
  Administered 2024-02-20: 0.5 mg via INTRAVENOUS
  Filled 2024-02-20: qty 1

## 2024-02-20 MED ORDER — ALBUTEROL SULFATE HFA 108 (90 BASE) MCG/ACT IN AERS: 2.0000 | INHALATION_SPRAY | Freq: Four times a day (QID) | RESPIRATORY_TRACT | Status: DC | PRN

## 2024-02-20 MED ORDER — IPRATROPIUM-ALBUTEROL 0.5-2.5 (3) MG/3ML IN SOLN
3.0000 mL | Freq: Four times a day (QID) | RESPIRATORY_TRACT | Status: DC
  Administered 2024-02-20 – 2024-02-21 (×5): 3 mL via RESPIRATORY_TRACT
  Filled 2024-02-20 (×5): qty 3

## 2024-02-20 MED ORDER — FENTANYL CITRATE (PF) 50 MCG/ML IJ SOSY
12.5000 ug | PREFILLED_SYRINGE | INTRAMUSCULAR | Status: DC | PRN
  Administered 2024-02-20 – 2024-02-21 (×2): 12.5 ug via INTRAVENOUS
  Administered 2024-02-21 – 2024-02-23 (×4): 25 ug via INTRAVENOUS
  Filled 2024-02-20 (×5): qty 1

## 2024-02-20 MED ORDER — FLEET ENEMA RE ENEM: 1.0000 | ENEMA | Freq: Once | RECTAL | Status: DC

## 2024-02-20 MED ORDER — SODIUM CHLORIDE 0.9 % IV SOLN: INTRAVENOUS | Status: DC

## 2024-02-20 MED ORDER — TECHNETIUM TC 99M MEBROFENIN IV KIT
5.4000 | PACK | Freq: Once | INTRAVENOUS | Status: AC
  Administered 2024-02-20: 5.4 via INTRAVENOUS

## 2024-02-20 NOTE — Progress Notes (Addendum)
 3 Days Post-Op   Subjective/Chief Complaint: Family says he is having more pain. He is down in HIDA now   Objective: Vital signs in last 24 hours: Temp:  [98.4 F (36.9 C)-99.8 F (37.7 C)] 98.4 F (36.9 C) (12/26 0624) Pulse Rate:  [85-95] 85 (12/26 0624) Resp:  [18-24] 24 (12/26 0624) BP: (156-177)/(81-97) 157/81 (12/26 0624) SpO2:  [93 %-94 %] 93 % (12/26 0624) Last BM Date : 02/11/24  Intake/Output from previous day: 12/25 0701 - 12/26 0700 In: -  Out: 575 [Drains:575] Intake/Output this shift: Total I/O In: 2961.1 [I.V.:2761.1; IV Piggyback:200] Out: 220 [Drains:220]  General appearance: alert and cooperative Resp: wheezes bilaterally Cardio: regular rate and rhythm GI: tender RUQ. Drain output bilious  Lab Results:  Recent Labs    02/19/24 0406 02/20/24 0340  WBC 20.8* 24.8*  HGB 11.6* 10.6*  HCT 34.7* 31.8*  PLT 334 354   BMET Recent Labs    02/19/24 0406 02/20/24 0340  NA 134* 137  K 3.8 3.5  CL 98 100  CO2 23 24  GLUCOSE 131* 102*  BUN 17 23  CREATININE 0.87 0.73  CALCIUM 9.1 9.2   PT/INR No results for input(s): LABPROT, INR in the last 72 hours. ABG No results for input(s): PHART, HCO3 in the last 72 hours.  Invalid input(s): PCO2, PO2  Studies/Results: MR ABDOMEN MRCP WO CONTRAST Result Date: 02/19/2024 CLINICAL DATA:  Postop day 1 from laparoscopic cholecystectomy. Elevated LFTs. Clinical concern for retained common bile duct stone. EXAM: MRI ABDOMEN WITHOUT CONTRAST  (INCLUDING MRCP) TECHNIQUE: Multiplanar multisequence MR imaging of the abdomen was performed. Heavily T2-weighted images of the biliary and pancreatic ducts were obtained, and three-dimensional MRCP images were rendered by post processing. COMPARISON:  Abdomen and pelvis CT 02/16/2024 FINDINGS: Lower chest: Dependent atelectasis with tiny right pleural effusion. Hepatobiliary: No suspicious focal abnormality in the liver on this noncontrast study. There is some  periportal edema with edema in the inferior liver around the gallbladder fossa. Heterogeneous signal intensity in the gallbladder fossa likely reflects a combination of postoperative fluid/blood products in packing material. No substantial intrahepatic biliary duct dilatation. Common duct measures 7 mm diameter in the porta hepatis. Common bile duct just proximal to the ampulla is 7 mm diameter. There is no evidence for choledocholithiasis. Pancreas: No focal mass lesion. No dilatation of the main duct. No intraparenchymal cyst. No peripancreatic edema. Edema around the duodenum and pancreatic head is 2 compatible with recent surgery. Spleen:  No splenomegaly. No suspicious focal mass lesion. Adrenals/Urinary Tract: No adrenal nodule or mass. Bilateral renal cysts evident. Stomach/Bowel: Stomach is moderately distended with fluid. Small bowel and colon shows mild diffuse gaseous distension. Vascular/Lymphatic: No abdominal aortic aneurysm small retroperitoneal lymph nodes are similar to recent CT scan. Other: Edema in the anterior abdominal wall is compatible with the recent surgical access. There is dependent subcutaneous body wall edema bilaterally bilaterally. Musculoskeletal: No overtly suspicious marrow signal abnormality. IMPRESSION: 1. Postop day 1 from cholecystectomy. Heterogeneous signal intensity in the gallbladder fossa likely reflects a combination of postoperative fluid/blood products and packing material. 2. No substantial intrahepatic biliary duct dilatation. Common duct measures 7 mm diameter in the porta hepatis and just proximal to the ampulla. No evidence for choledocholithiasis. 3. Mild diffuse gaseous distension of the small bowel and colon compatible with mild postoperative ileus. 4. Dependent atelectasis with tiny right pleural effusion. Electronically Signed   By: Camellia Candle M.D.   On: 02/19/2024 05:32   DG Abd Portable 1V  Result Date: 02/18/2024 CLINICAL DATA:  Distension postop  cholecystostomy EXAM: PORTABLE ABDOMEN - 1 VIEW COMPARISON:  CT 02/16/2024 FINDINGS: Postop changes in the right upper quadrant with clips and probable drainage catheter. Mild increased small and large bowel gas without obstruction. Moderate stool IMPRESSION: Mild increased small and large bowel gas without obstruction, possible mild ileus. Moderate stool. Electronically Signed   By: Luke Bun M.D.   On: 02/18/2024 15:59   DG Chest Port 1 View Result Date: 02/18/2024 CLINICAL DATA:  Shortness of breath EXAM: PORTABLE CHEST 1 VIEW COMPARISON:  02/16/2024 FINDINGS: Hypoventilatory change. Minimal atelectasis right base. No consolidation, pleural effusion or pneumothorax. Normal cardiac size. Aortic atherosclerosis. Left shoulder replacement IMPRESSION: Hypoventilatory changes with minimal atelectasis at the right base. Electronically Signed   By: Luke Bun M.D.   On: 02/18/2024 15:58    Anti-infectives: Anti-infectives (From admission, onward)    Start     Dose/Rate Route Frequency Ordered Stop   02/18/24 2000  piperacillin -tazobactam (ZOSYN ) IVPB 3.375 g        3.375 g 12.5 mL/hr over 240 Minutes Intravenous Every 8 hours 02/18/24 1400 02/23/24 1959   02/17/24 2200  piperacillin -tazobactam (ZOSYN ) IVPB 3.375 g        3.375 g 12.5 mL/hr over 240 Minutes Intravenous Every 8 hours 02/17/24 1708 02/18/24 1652   02/17/24 0600  piperacillin -tazobactam (ZOSYN ) IVPB 3.375 g  Status:  Discontinued        3.375 g 12.5 mL/hr over 240 Minutes Intravenous Every 8 hours 02/17/24 0025 02/17/24 1708   02/16/24 2145  piperacillin -tazobactam (ZOSYN ) IVPB 3.375 g        3.375 g 100 mL/hr over 30 Minutes Intravenous  Once 02/16/24 2137 02/16/24 2232       Assessment/Plan: s/p Procedures with comments: LAPAROSCOPIC CHOLECYSTECTOMY (N/A) - ICG, possible IOC POD 3 lap chole Bile leak. Getting hida now. May need ercp and stent to help with leak. GI following Continue IV abx Will get CT to look for  undrained fluid given rise in wbc Will start albuterol  for wheezing  LOS: 4 days    Deward Null III 02/20/2024

## 2024-02-20 NOTE — H&P (View-Only) (Signed)
 [facility] Gastroenterology Progress Note  Nathan Stewart 68 y.o. 1955/08/26  CC: Suspected bile leak  Patient underwent laparoscopic cholecystectomy on December 23 and was found to have severe gangrenous cholecystitis.  Follow-up MRI MRCP negative for choledocholithiasis.  Was found to have bilious drain.  Patient is also having generalized colonic and small bowel ileus.  His LFTs are improving.  T. bili is down to 1.8 from max of 5 few days ago.  AST ALT alk phos also improving.  CBC shows worsening leukocytosis with white count of 24.8 today.   Subjective: ------------- - Multiple attempts made to see the patient in the room.  Discussed with wife in the room.  Patient seen and examined in radiology unit.  He is complaining of ongoing abdominal pain and distention.  No bowel movement in last 4 to 5 days.  Denies any vomiting.  Not passing gas.  ROS : Negative for nausea.  Negative for chest pain.   Objective: Vital signs in last 24 hours: Vitals:   02/20/24 0434 02/20/24 0624  BP: (!) 177/97 (!) 157/81  Pulse: 95 85  Resp: 20 (!) 24  Temp: 99.6 F (37.6 C) 98.4 F (36.9 C)  SpO2: 93% 93%    Physical Exam: Resting comfortably, not in acute distress.  Abdomen is moderately distended with generalized discomfort on palpation, no localized tenderness, no peritoneal signs, hypoactive bowel sounds,.  Mood and affect normal.  Alert and oriented x 3.    Lab Results: Recent Labs    02/19/24 0406 02/20/24 0340  NA 134* 137  K 3.8 3.5  CL 98 100  CO2 23 24  GLUCOSE 131* 102*  BUN 17 23  CREATININE 0.87 0.73  CALCIUM 9.1 9.2   Recent Labs    02/19/24 0406 02/20/24 0340  AST 118* 47*  ALT 333* 202*  ALKPHOS 369* 311*  BILITOT 3.8* 1.8*  PROT 7.1 6.9  ALBUMIN  3.5 3.2*   Recent Labs    02/18/24 0454 02/18/24 1553 02/19/24 0406 02/20/24 0340  WBC 17.5*   < > 20.8* 24.8*  NEUTROABS 15.1*  --   --   --   HGB 12.0*   < > 11.6* 10.6*  HCT 34.7*   < > 34.7* 31.8*  MCV 86.8    < > 88.3 89.6  PLT 261   < > 334 354   < > = values in this interval not displayed.   No results for input(s): LABPROT, INR in the last 72 hours.    Assessment/Plan: -Worsening abdominal pain and worsening leukocytosis in a patient who underwent laparoscopic cholecystectomy on February 17, 2024 and was found to have severe gangrenous  cholecystitis.  Concern for bile leak. - Abdominal distention.  No bowel movement for last 4 to 5 days.  Recommendations ---------------------------- - Follow HIDA scan result as well as CT scan. - He will likely need ERCP.  I will discuss with Dr. Enid. - 1 Fleet enema for constipation - Continue Zosyn  for now - Keep n.p.o. past midnight - GI will follow.   Layla Lah MD, FACP 02/20/2024, 9:07 AM  Contact #  [phone]

## 2024-02-20 NOTE — Plan of Care (Signed)
" °  Problem: Clinical Measurements: Goal: Respiratory complications will improve Outcome: Progressing   Problem: Clinical Measurements: Goal: Cardiovascular complication will be avoided Outcome: Progressing   Problem: Skin Integrity: Goal: Risk for impaired skin integrity will decrease Outcome: Progressing   Problem: Safety: Goal: Ability to remain free from injury will improve Outcome: Progressing   Problem: Pain Managment: Goal: General experience of comfort will improve and/or be controlled Outcome: Progressing   "

## 2024-02-20 NOTE — Progress Notes (Addendum)
 "          PROGRESS NOTE  JAMARR TREINEN FMW:[id number] DOB: 03/03/1955 DOA: 02/16/2024 PCP: [name], MD  Brief History:  68 y.o. 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 He states the pain resolved, but for the last week he has been having nausea and unable to eat. He has been drinking though and able to keep it down. Pt was found to have acute cholecystitis (no stones seen on CT) on a CT scan. His WBC 18.0 in ED. General surgery consulted and pt taken to surgery for lap chole on 02/17/24.    Today, patient complaining of worsening abdominal pain, distention, not passing any gas.  Some wheezing noted  Addendum This evening, patient noted to be more tachycardic/tachypneic with persistent fever, noted to be deteriorating. Discussed with RN Olam to transfer patient to SDU for closer observation. Continue IV zosyn , IVF, npo, pulmonary toileting. Repeat LA pending. PCCM Dr Adrien briefly eyeballed patient prior to end of her shift, no ICU intervention required as of this evening. GI and General surgery notified of change.   Assessment/Plan: Acute cholecystitis s/p lap chole on 12/23, noted necrotic gallbladder and cystic duct (encountered a bleeding vessel controlled with a clip during surgery) Noted biliary leak s/p lap chole Elevated LFTs s/p lap chole ?Post op ileus Sepsis Currently febrile, worsening leukocytosis Noted worsening LFTs, including T. bili increased to 5, slightly improving Procalcitonin 2.76 Abdominal x-ray on 12/24 with mild increase small and large bowel gas without obstruction, possibly mild ileus, noted moderate stool, repeat with generalized ileus Chest x-ray with mild atelectasis MRCP showed postcholecystectomy, noted postoperative fluid/blood products unpacking material HIDA scan  findings consistent with biliary leak General Surgery ordering HIDA scan, pending GI consulted, plan for ERCP on 02/21/2024 BC x 2 no growth to date, repeat pending NGTD Continue IV Zosyn , IV fluids, n.p.o. 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 intake and dehydration Continue IV fluids     Consultants:  general surgery, GI  Code Status:  FULL   DVT Prophylaxis:  SCD   Procedures: As Listed in Progress Note Above  Antibiotics: Zosyn  12/23>>   Objective: Vitals:   02/20/24 0434 02/20/24 0624 02/20/24 1400 02/20/24 1540  BP: (!) 177/97 (!) 157/81 (!) 173/81 (!) 175/81  Pulse: 95 85 99   Resp: 20 (!) 24  16  Temp: 99.6 F (37.6 C) 98.4 F (36.9 C) 97.9 F (36.6 C) (!) 100.5 F (38.1 C)  TempSrc: Oral Oral Oral Oral  SpO2: 93% 93% 96% 96%  Weight:      Height:        Intake/Output Summary (Last 24 hours) at 02/20/2024 1818 Last data filed at 02/20/2024 1708 Gross per 24 hour  Intake 3356.13 ml  Output 985 ml  Net 2371.13 ml   Weight change:  Exam: General: NAD  Cardiovascular: S1, S2 present Respiratory: CTAB Abdomen: Soft, tender, distended, no bowel sounds Musculoskeletal: No bilateral pedal edema noted Skin: Normal Psychiatry: Normal mood     Data Reviewed:  I have personally reviewed following labs and imaging studies Basic Metabolic Panel: Recent Labs  Lab 02/16/24 1720 02/17/24 0042 02/18/24 0454 02/19/24 0406 02/20/24 0340  NA 129* 131* 132* 134* 137  K 3.0* 3.3* 3.7 3.8 3.5  CL 87* 91* 95* 98 100  CO2 26 26 25 23 24   GLUCOSE 112* 108* 147* 131* 102*  BUN 12 12 11 17 23   CREATININE 0.88 0.81 0.75 0.87 0.73  CALCIUM 10.8* 10.1 9.2 9.1 9.2  MG  --  2.2   --   --   --   PHOS  --  3.5  --   --   --    Liver Function Tests: Recent Labs  Lab 02/16/24 1720 02/17/24 0042 02/18/24 0454 02/19/24 0406 02/20/24 0340  AST 29 37 302* 118* 47*  ALT 34 40 414* 333* 202*  ALKPHOS 122 261* 467* 369* 311*  BILITOT 1.1 1.2 5.0* 3.8* 1.8*  PROT 8.0 8.1 6.9 7.1 6.9  ALBUMIN  4.1 4.0 3.6 3.5 3.2*   Recent Labs  Lab 02/16/24 1720  LIPASE 30   No results for input(s): AMMONIA in the last 168 hours. Coagulation Profile: No results for input(s): INR, PROTIME in the last 168 hours. CBC: Recent Labs  Lab 02/17/24 0042 02/18/24 0454 02/18/24 1553 02/19/24 0406 02/20/24 0340  WBC 16.3* 17.5* 19.6* 20.8* 24.8*  NEUTROABS  --  15.1*  --   --   --   HGB 12.9* 12.0* 12.1* 11.6* 10.6*  HCT 37.4* 34.7* 35.6* 34.7* 31.8*  MCV 85.6 86.8 87.5 88.3 89.6  PLT 280 261 339 334 354   Cardiac Enzymes: Recent Labs  Lab 02/17/24 0043  CKTOTAL 228   BNP: Invalid input(s): POCBNP CBG: Recent Labs  Lab 02/18/24 1521  GLUCAP 182*   HbA1C: No results for input(s): HGBA1C in the last 72 hours. Urine analysis:    Component Value Date/Time   COLORURINE YELLOW 02/16/2024 1840   APPEARANCEUR CLEAR 02/16/2024 1840   LABSPEC 1.007 02/16/2024 1840   PHURINE 7.0 02/16/2024 1840   GLUCOSEU NEGATIVE 02/16/2024 1840   HGBUR SMALL (A) 02/16/2024 1840   HGBUR trace-lysed 03/20/2010 0814   BILIRUBINUR NEGATIVE 02/16/2024 1840   BILIRUBINUR small (A) 02/13/2024 1641   BILIRUBINUR n 06/07/2013 1107   KETONESUR NEGATIVE 02/16/2024 1840   PROTEINUR NEGATIVE 02/16/2024 1840   UROBILINOGEN 1.0 02/13/2024 1641   UROBILINOGEN 0.2 11/16/2014 0907   NITRITE NEGATIVE 02/16/2024 1840   LEUKOCYTESUR NEGATIVE 02/16/2024 1840   Sepsis Labs: @LABRCNTIP (procalcitonin:4,lacticidven:4) ) Recent Results (from the past 240 hours)  Urine Culture     Status: None   Collection Time: 02/13/24  5:05 PM   Specimen: Urine, Clean Catch  Result Value Ref Range Status    Specimen Description   Final    URINE, CLEAN CATCH Performed at [facility], [address], [place], [place] [address]    Special Requests   Final    NONE Performed at [facility], [address] [place], [place] [address]    Culture   Final    NO GROWTH Performed at [facility], [address] [place], [place] [address]    Report Status 02/15/2024 FINAL  Final  Blood culture (routine x 2)     Status: None (Preliminary result)   Collection Time: 02/16/24  5:20 PM   Specimen: BLOOD RIGHT HAND  Result Value Ref Range Status   Specimen Description   Final    BLOOD RIGHT HAND Performed at [facility],  [address] [place], [place] [address]    Special Requests   Final    BOTTLES DRAWN AEROBIC AND ANAEROBIC Blood Culture adequate volume Performed at [facility], [address] [place], [place] [address]    Culture   Final    NO GROWTH 3 DAYS Performed at [facility], [address], [place], [place] [address]    Report Status PENDING  Incomplete  Resp panel by RT-PCR (RSV, Flu A&B, Covid) Anterior Nasal Swab     Status: None   Collection Time: 02/16/24  5:20 PM   Specimen: Anterior Nasal Swab  Result Value Ref Range Status   SARS Coronavirus 2 by RT PCR NEGATIVE NEGATIVE Final    Comment: (NOTE) SARS-CoV-2 target nucleic acids are NOT DETECTED.  The SARS-CoV-2 RNA is generally detectable in upper respiratory specimens during the acute phase of infection. The lowest concentration of SARS-CoV-2 viral copies this assay can detect is 138 copies/mL. A negative result does not preclude SARS-Cov-2 infection and should not be used as the sole basis for treatment or other patient management decisions. A negative result may occur with  improper specimen collection/handling, submission of specimen other than nasopharyngeal swab, presence of viral mutation(s) within the areas targeted by this assay, and inadequate number of  viral copies(<138 copies/mL). A negative result must be combined with clinical observations, patient history, and epidemiological information. The expected result is Negative.  Fact Sheet for Patients:  bloggercourse.com  Fact Sheet for Healthcare Providers:  seriousbroker.it  This test is no t yet approved or cleared by the [place]  FDA and  has been authorized for detection and/or diagnosis of SARS-CoV-2 by FDA under an Emergency Use Authorization (EUA). This EUA will remain  in effect (meaning this test can be used) for the duration of the COVID-19 declaration under Section 564(b)(1) of the Act, 21 U.S.C.section 360bbb-3(b)(1), unless the authorization is terminated  or revoked sooner.       Influenza A by PCR NEGATIVE NEGATIVE Final   Influenza B by PCR NEGATIVE NEGATIVE Final    Comment: (NOTE) The Xpert Xpress SARS-CoV-2/FLU/RSV plus assay is intended as an aid in the diagnosis of influenza from Nasopharyngeal swab specimens and should not be used as a sole basis for treatment. Nasal washings and aspirates are unacceptable for Xpert Xpress SARS-CoV-2/FLU/RSV testing.  Fact Sheet for Patients: bloggercourse.com  Fact Sheet for Healthcare Providers: seriousbroker.it  This test is not yet approved or cleared by the [place]  FDA and has been authorized for detection and/or diagnosis of SARS-CoV-2 by FDA under an Emergency Use Authorization (EUA). This EUA will remain in effect (meaning this test can be used) for the duration of the COVID-19 declaration under Section 564(b)(1) of the Act, 21 U.S.C. section 360bbb-3(b)(1), unless the authorization is terminated or revoked.     Resp Syncytial Virus by PCR NEGATIVE NEGATIVE Final    Comment: (NOTE) Fact Sheet for Patients: bloggercourse.com  Fact Sheet for Healthcare  Providers: seriousbroker.it  This test is not yet approved or cleared by the [place]  FDA and has been authorized for detection and/or diagnosis of SARS-CoV-2 by FDA under an Emergency Use Authorization (EUA). This EUA will remain in effect (meaning this test can be used) for the duration of the COVID-19 declaration under Section 564(b)(1) of the Act, 21 U.S.C. section 360bbb-3(b)(1), unless the authorization is terminated or revoked.  Performed at [facility], [address] [place], [place] [address]   Blood culture (routine x 2)  Status: None (Preliminary result)   Collection Time: 02/16/24  5:25 PM   Specimen: BLOOD RIGHT FOREARM  Result Value Ref Range Status   Specimen Description   Final    BLOOD RIGHT FOREARM Performed at [facility], [address] [place], [place] [address]    Special Requests   Final    BOTTLES DRAWN AEROBIC AND ANAEROBIC Blood Culture adequate volume Performed at [facility], [address] [place], [place] [address]    Culture   Final    NO GROWTH 3 DAYS Performed at [facility], [address], [place], [place] [address]    Report Status PENDING  Incomplete  Culture, blood (Routine X 2) w Reflex to ID Panel     Status: None (Preliminary result)   Collection Time: 02/19/24  7:58 AM   Specimen: BLOOD RIGHT ARM  Result Value Ref Range Status   Specimen Description   Final    BLOOD RIGHT ARM Performed at [facility], [address], [place], [place] [address]    Special Requests   Final    BOTTLES DRAWN AEROBIC ONLY Blood Culture results may not be optimal due to an inadequate volume of blood received in culture bottles Performed at [facility], [address], [place], [place] [address]    Culture   Final    NO GROWTH < 24 HOURS Performed at [facility], [address] [place], [place] [address]    Report Status  PENDING  Incomplete  Culture, blood (Routine X 2) w Reflex to ID Panel     Status: None (Preliminary result)   Collection Time: 02/19/24  7:58 AM   Specimen: BLOOD RIGHT HAND  Result Value Ref Range Status   Specimen Description   Final    BLOOD RIGHT HAND Performed at [facility], [address], [place], [place] [address]    Special Requests   Final    BOTTLES DRAWN AEROBIC ONLY Blood Culture results may not be optimal due to an inadequate volume of blood received in culture bottles Performed at [facility], [address] [place], [place] [address]    Culture   Final    NO GROWTH < 24 HOURS Performed at [facility], [address] [place], [place] [address]    Report Status PENDING  Incomplete     Scheduled Meds:  acetaminophen   1,000 mg Oral Q6H   enoxaparin  (LOVENOX ) injection  50 mg Subcutaneous Q24H   ipratropium-albuterol   3 mL Nebulization Q6H   metoprolol  succinate  100 mg Oral Daily   Continuous Infusions:  sodium chloride  100 mL/hr at 02/20/24 1648   sodium chloride      piperacillin -tazobactam (ZOSYN )  IV 12.5 mL/hr at 02/20/24 1648    Procedures/Studies: CT ABDOMEN PELVIS WO CONTRAST Result Date: 02/20/2024 EXAM: CT ABDOMEN AND PELVIS WITHOUT CONTRAST 02/20/2024 01:22:00 PM TECHNIQUE: CT of the abdomen and pelvis was performed without the administration of intravenous contrast. Multiplanar reformatted images are provided for review. Automated exposure control, iterative reconstruction, and/or weight-based adjustment of the mA/kV was utilized to reduce the radiation dose to as low as reasonably achievable. COMPARISON: HIDA scan dated 02/20/2024. CLINICAL HISTORY: Abdominal pain, post-op.Postoperative cholecystectomy day 3. FINDINGS: LOWER CHEST: Mild bibasilar atelectasis. LIVER: The liver is unremarkable. GALLBLADDER AND BILE DUCTS: Collection in the gallbladder fossa measures 6.1 x 4.8 cm. The collection is low density. Percutaneous  drain extends through the fluid collection.  Cholecystectomy clips in the region of the cystic duct. There is no biliary ductal dilatation. The common bile duct is normal caliber. SPLEEN: No acute abnormality. PANCREAS: No evidence of pancreatitis on noncontrast exam. ADRENAL GLANDS: No acute abnormality. KIDNEYS, URETERS AND BLADDER: No stones in the kidneys or ureters. No hydronephrosis. No perinephric or periureteral stranding. Urinary bladder is unremarkable. GI AND BOWEL: Stomach is normal. Duodenum appears normal. The bowel is fluid filled with air-fluid levels suggesting postoperative ileus. No pneumatosis. Stool in the rectum. PERITONEUM AND RETROPERITONEUM: Small amount of scattered gas in the upper peritoneal space related to recent laparoscopic surgery. Small amount of high density fluid collects dependently within the posterior cul de sac with a fluid-fluid level. Small volume hemoperitoneum presumably related to recent surgery. VASCULATURE: Aorta is normal in caliber. LYMPH NODES: No lymphadenopathy. REPRODUCTIVE ORGANS: No acute abnormality. BONES AND SOFT TISSUES: No acute osseous abnormality. No focal soft tissue abnormality. IMPRESSION: 1. Collection in the gallbladder fossa measuring 6.1 x 4.8 cm with percutaneous drain in place. Findings consistent with predominantly contained bowel gas. 2. Postoperative ileus. 3. Small volume hemoperitoneum complex in the posterior cul de sac , presumably related to recent surgery. 4. These results will be called to the ordering clinician or representative by the radiologist assistant, and communication documented in the pacs or clario dashboard Electronically signed by: Norleen Boxer MD 02/20/2024 03:59 PM EST RP Workstation: [id number]   DG Abd Portable 1V Result Date: 02/20/2024 CLINICAL DATA:  Abdominal distension. EXAM: PORTABLE ABDOMEN - 1 VIEW COMPARISON:  02/18/2024 FINDINGS: Increasing gaseous distention of bowel loops in the central abdomen, primarily  colonic distension, however prominent air-filled loops of small bowel are also seen. Drain in the right mid abdomen. Right upper quadrant surgical clips. IMPRESSION: Increasing gaseous distention of bowel loops in the central abdomen, primarily colonic distension, however prominent air-filled loops of small bowel are also seen. Favor generalized ileus. Electronically Signed   By: Andrea Gasman M.D.   On: 02/20/2024 14:05   NM HEPATOBILIARY LEAK (POST-SURGICAL) Result Date: 02/20/2024 EXAM: NM HEPATOBILLARY SCAN 02/20/2024 12:50:37 PM TECHNIQUE: RADIOPHARMACEUTICAL: 5.4 mCi Tc-47m mebrofenin  Dynamic images of the abdomen and pelvis were obtained in the anterior projection for 1 hour after intravenous administration of radiopharmaceutical. The biliary drain was clamped for the second hour of the exam and imaging continued for at least 30 minutes. COMPARISON: None available. CLINICAL HISTORY: Abdominal pain, post surgery or trauma, bile leak suspected. FINDINGS: Homogenous uptake within the liver. Normal clearance of the blood pool. Initial imaging demonstrates excreted radiotracer in the common hepatic duct with evidence of radiotracer within the biliary drain. The drain was clamped for the second hour of the exam. With the drain clamped, extra biliary radiotracer collects within the gallbladder fossa. Findings consistent with contained bowel leak within the gallbladder fossa. Radiotracer evacuating through the biliary drain when unclamped. No activity in the small bowel. IMPRESSION: 1. Radiotracer evacuates through the biliary drain when unclamped. 2. With drain clamped, extra biliary radiotracer collects within the gallbladder fossa. 3. Findings consistent with biliary leak . No activity in the small bowel suggests brisk leak. 4. These results will be called to the ordering clinician or representative by the radiologist assistant, and communication documented in the pacs or clario dashboard Electronically  signed by: Norleen Boxer MD 02/20/2024 01:11 PM EST RP Workstation: [id number]   MR ABDOMEN MRCP WO CONTRAST Result Date: 02/19/2024 CLINICAL DATA:  Postop day 1 from laparoscopic cholecystectomy. Elevated LFTs. Clinical concern for retained common bile duct  stone. EXAM: MRI ABDOMEN WITHOUT CONTRAST  (INCLUDING MRCP) TECHNIQUE: Multiplanar multisequence MR imaging of the abdomen was performed. Heavily T2-weighted images of the biliary and pancreatic ducts were obtained, and three-dimensional MRCP images were rendered by post processing. COMPARISON:  Abdomen and pelvis CT 02/16/2024 FINDINGS: Lower chest: Dependent atelectasis with tiny right pleural effusion. Hepatobiliary: No suspicious focal abnormality in the liver on this noncontrast study. There is some periportal edema with edema in the inferior liver around the gallbladder fossa. Heterogeneous signal intensity in the gallbladder fossa likely reflects a combination of postoperative fluid/blood products in packing material. No substantial intrahepatic biliary duct dilatation. Common duct measures 7 mm diameter in the porta hepatis. Common bile duct just proximal to the ampulla is 7 mm diameter. There is no evidence for choledocholithiasis. Pancreas: No focal mass lesion. No dilatation of the main duct. No intraparenchymal cyst. No peripancreatic edema. Edema around the duodenum and pancreatic head is 2 compatible with recent surgery. Spleen:  No splenomegaly. No suspicious focal mass lesion. Adrenals/Urinary Tract: No adrenal nodule or mass. Bilateral renal cysts evident. Stomach/Bowel: Stomach is moderately distended with fluid. Small bowel and colon shows mild diffuse gaseous distension. Vascular/Lymphatic: No abdominal aortic aneurysm small retroperitoneal lymph nodes are similar to recent CT scan. Other: Edema in the anterior abdominal wall is compatible with the recent surgical access. There is dependent subcutaneous body wall edema bilaterally  bilaterally. Musculoskeletal: No overtly suspicious marrow signal abnormality. IMPRESSION: 1. Postop day 1 from cholecystectomy. Heterogeneous signal intensity in the gallbladder fossa likely reflects a combination of postoperative fluid/blood products and packing material. 2. No substantial intrahepatic biliary duct dilatation. Common duct measures 7 mm diameter in the porta hepatis and just proximal to the ampulla. No evidence for choledocholithiasis. 3. Mild diffuse gaseous distension of the small bowel and colon compatible with mild postoperative ileus. 4. Dependent atelectasis with tiny right pleural effusion. Electronically Signed   By: Camellia Candle M.D.   On: 02/19/2024 05:32   DG Abd Portable 1V Result Date: 02/18/2024 CLINICAL DATA:  Distension postop cholecystostomy EXAM: PORTABLE ABDOMEN - 1 VIEW COMPARISON:  CT 02/16/2024 FINDINGS: Postop changes in the right upper quadrant with clips and probable drainage catheter. Mild increased small and large bowel gas without obstruction. Moderate stool IMPRESSION: Mild increased small and large bowel gas without obstruction, possible mild ileus. Moderate stool. Electronically Signed   By: Luke Bun M.D.   On: 02/18/2024 15:59   DG Chest Port 1 View Result Date: 02/18/2024 CLINICAL DATA:  Shortness of breath EXAM: PORTABLE CHEST 1 VIEW COMPARISON:  02/16/2024 FINDINGS: Hypoventilatory change. Minimal atelectasis right base. No consolidation, pleural effusion or pneumothorax. Normal cardiac size. Aortic atherosclerosis. Left shoulder replacement IMPRESSION: Hypoventilatory changes with minimal atelectasis at the right base. Electronically Signed   By: Luke Bun M.D.   On: 02/18/2024 15:58   CT ABDOMEN PELVIS W CONTRAST Result Date: 02/16/2024 EXAM: CT ABDOMEN AND PELVIS WITH CONTRAST 02/16/2024 07:57:25 PM TECHNIQUE: CT of the abdomen and pelvis was performed with the administration of 100 mL of iohexol  (OMNIPAQUE ) 350 MG/ML injection.  Multiplanar reformatted images are provided for review. Automated exposure control, iterative reconstruction, and/or weight-based adjustment of the mA/kV was utilized to reduce the radiation dose to as low as reasonably achievable. COMPARISON: None available. CLINICAL HISTORY: Epigastric pain; cp, epigastric abd pain, leukocytosis. FINDINGS: LOWER CHEST: No acute abnormality. LIVER: Hepatic steatosis. GALLBLADDER AND BILE DUCTS: Distended gallbladder with wall thickening and pericholecystic fluid compatible with acute cholecystitis. No radiopaque stone or biliary  ductal dilatation. SPLEEN: No acute abnormality. PANCREAS: No acute abnormality. ADRENAL GLANDS: No acute abnormality. KIDNEYS, URETERS AND BLADDER: No stones in the kidneys or ureters. No hydronephrosis. No perinephric or periureteral stranding. Urinary bladder is unremarkable. GI AND BOWEL: Stomach demonstrates no acute abnormality. Colonic diverticulosis without evidence of diverticulitis. There is no bowel obstruction. PERITONEUM AND RETROPERITONEUM: No ascites. No free air. VASCULATURE: Aorta is normal in caliber. Aortic atherosclerotic calcification. LYMPH NODES: No lymphadenopathy. REPRODUCTIVE ORGANS: No acute abnormality. BONES AND SOFT TISSUES: No acute osseous abnormality. No focal soft tissue abnormality. IMPRESSION: 1. Acute cholecystitis. Electronically signed by: Norman Gatlin MD 02/16/2024 08:10 PM EST RP Workstation: [id number]   CT Angio Chest PE W and/or Wo Contrast Result Date: 02/16/2024 EXAM: CTA CHEST 02/16/2024 07:57:25 PM TECHNIQUE: CTA of the chest was performed with the administration of 100 mL of iohexol  (OMNIPAQUE ) 350 MG/ML injection. Multiplanar reformatted images are provided for review. MIP images are provided for review. Automated exposure control, iterative reconstruction, and/or weight based adjustment of the mA/kV was utilized to reduce the radiation dose to as low as reasonably achievable. COMPARISON: Comparison  with the same day x-ray. CLINICAL HISTORY: Pulmonary embolism (PE) suspected, high prob; cough, cp, abd pain. FINDINGS: PULMONARY ARTERIES: Pulmonary arteries are adequately opacified for evaluation. No acute pulmonary embolus. Main pulmonary artery is normal in caliber. MEDIASTINUM: Coronary artery and aortic atherosclerotic calcification. The heart and pericardium demonstrate no acute abnormality. There is no acute abnormality of the thoracic aorta. LYMPH NODES: No mediastinal, hilar or axillary lymphadenopathy. LUNGS AND PLEURA: Emphysema. Right lower lobe atelectasis. No evidence of pleural effusion or pneumothorax. UPPER ABDOMEN: Hepatic steatosis. SOFT TISSUES AND BONES: No acute bone or soft tissue abnormality. IMPRESSION: 1. No pulmonary embolism. 2. Emphysema. Pulmonary emphysema is an independent risk factor for lung cancer. Recommend consideration for evaluation for a low-dose CT lung cancer screening program. Electronically signed by: Norman Gatlin MD 02/16/2024 08:07 PM EST RP Workstation: [id number]   DG Chest Portable 1 View Result Date: 02/16/2024 CLINICAL DATA:  Cough. EXAM: PORTABLE CHEST 1 VIEW COMPARISON:  05/11/2021. FINDINGS: The heart size and mediastinal contours are within normal limits. There is elevation of the right diaphragm. Atelectasis is noted at the lung bases bilaterally. No effusion or pneumothorax is seen. 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 IMPRESSION: Elevation of the right diaphragm with atelectasis at the lung bases. Electronically Signed   By: Leita Birmingham M.D.   On: 02/16/2024 17:48    Lebron JINNY Cage, MD  [facility]  If 7PM-7AM, please contact night-coverage [email or web address] 02/20/2024, 6:18 PM   LOS: 4 days   "

## 2024-02-20 NOTE — Progress Notes (Signed)
 [facility] Gastroenterology Progress Note  Nathan Stewart 68 y.o. 1955/08/26  CC: Suspected bile leak  Patient underwent laparoscopic cholecystectomy on December 23 and was found to have severe gangrenous cholecystitis.  Follow-up MRI MRCP negative for choledocholithiasis.  Was found to have bilious drain.  Patient is also having generalized colonic and small bowel ileus.  His LFTs are improving.  T. bili is down to 1.8 from max of 5 few days ago.  AST ALT alk phos also improving.  CBC shows worsening leukocytosis with white count of 24.8 today.   Subjective: ------------- - Multiple attempts made to see the patient in the room.  Discussed with wife in the room.  Patient seen and examined in radiology unit.  He is complaining of ongoing abdominal pain and distention.  No bowel movement in last 4 to 5 days.  Denies any vomiting.  Not passing gas.  ROS : Negative for nausea.  Negative for chest pain.   Objective: Vital signs in last 24 hours: Vitals:   02/20/24 0434 02/20/24 0624  BP: (!) 177/97 (!) 157/81  Pulse: 95 85  Resp: 20 (!) 24  Temp: 99.6 F (37.6 C) 98.4 F (36.9 C)  SpO2: 93% 93%    Physical Exam: Resting comfortably, not in acute distress.  Abdomen is moderately distended with generalized discomfort on palpation, no localized tenderness, no peritoneal signs, hypoactive bowel sounds,.  Mood and affect normal.  Alert and oriented x 3.    Lab Results: Recent Labs    02/19/24 0406 02/20/24 0340  NA 134* 137  K 3.8 3.5  CL 98 100  CO2 23 24  GLUCOSE 131* 102*  BUN 17 23  CREATININE 0.87 0.73  CALCIUM 9.1 9.2   Recent Labs    02/19/24 0406 02/20/24 0340  AST 118* 47*  ALT 333* 202*  ALKPHOS 369* 311*  BILITOT 3.8* 1.8*  PROT 7.1 6.9  ALBUMIN  3.5 3.2*   Recent Labs    02/18/24 0454 02/18/24 1553 02/19/24 0406 02/20/24 0340  WBC 17.5*   < > 20.8* 24.8*  NEUTROABS 15.1*  --   --   --   HGB 12.0*   < > 11.6* 10.6*  HCT 34.7*   < > 34.7* 31.8*  MCV 86.8    < > 88.3 89.6  PLT 261   < > 334 354   < > = values in this interval not displayed.   No results for input(s): LABPROT, INR in the last 72 hours.    Assessment/Plan: -Worsening abdominal pain and worsening leukocytosis in a patient who underwent laparoscopic cholecystectomy on February 17, 2024 and was found to have severe gangrenous  cholecystitis.  Concern for bile leak. - Abdominal distention.  No bowel movement for last 4 to 5 days.  Recommendations ---------------------------- - Follow HIDA scan result as well as CT scan. - He will likely need ERCP.  I will discuss with Dr. Enid. - 1 Fleet enema for constipation - Continue Zosyn  for now - Keep n.p.o. past midnight - GI will follow.   Layla Lah MD, FACP 02/20/2024, 9:07 AM  Contact #  [phone]

## 2024-02-21 ENCOUNTER — Encounter (HOSPITAL_COMMUNITY)
Admission: EM | Disposition: A | Discharge status: Home / Self Care | Payer: Self-pay | Source: Ambulatory Visit | Attending: Internal Medicine

## 2024-02-21 ENCOUNTER — Encounter (HOSPITAL_COMMUNITY): Payer: Self-pay | Admitting: Internal Medicine

## 2024-02-21 ENCOUNTER — Inpatient Hospital Stay (HOSPITAL_COMMUNITY): Admitting: Anesthesiology

## 2024-02-21 ENCOUNTER — Inpatient Hospital Stay (HOSPITAL_COMMUNITY)

## 2024-02-21 DIAGNOSIS — K8050 Calculus of bile duct without cholangitis or cholecystitis without obstruction: Secondary | ICD-10-CM

## 2024-02-21 DIAGNOSIS — G4730 Sleep apnea, unspecified: Secondary | ICD-10-CM | POA: Diagnosis not present

## 2024-02-21 DIAGNOSIS — I10 Essential (primary) hypertension: Secondary | ICD-10-CM

## 2024-02-21 DIAGNOSIS — K838 Other specified diseases of biliary tract: Secondary | ICD-10-CM | POA: Diagnosis not present

## 2024-02-21 DIAGNOSIS — K8309 Other cholangitis: Secondary | ICD-10-CM

## 2024-02-21 DIAGNOSIS — Z87891 Personal history of nicotine dependence: Secondary | ICD-10-CM

## 2024-02-21 DIAGNOSIS — K9189 Other postprocedural complications and disorders of digestive system: Secondary | ICD-10-CM | POA: Diagnosis not present

## 2024-02-21 DIAGNOSIS — K810 Acute cholecystitis: Secondary | ICD-10-CM | POA: Diagnosis not present

## 2024-02-21 DIAGNOSIS — K567 Ileus, unspecified: Secondary | ICD-10-CM | POA: Diagnosis not present

## 2024-02-21 DIAGNOSIS — K839 Disease of biliary tract, unspecified: Secondary | ICD-10-CM

## 2024-02-21 HISTORY — PX: BILIARY STENT PLACEMENT: SHX5538

## 2024-02-21 HISTORY — PX: STONE EXTRACTION WITH BASKET: SHX5318

## 2024-02-21 HISTORY — PX: ERCP: SHX5425

## 2024-02-21 LAB — COMPREHENSIVE METABOLIC PANEL WITH GFR
ALT: 135 U/L — ABNORMAL HIGH (ref 0–44)
AST: 33 U/L (ref 15–41)
Albumin: 3.4 g/dL — ABNORMAL LOW (ref 3.5–5.0)
Alkaline Phosphatase: 384 U/L — ABNORMAL HIGH (ref 38–126)
Anion gap: 14 (ref 5–15)
BUN: 22 mg/dL (ref 8–23)
CO2: 23 mmol/L (ref 22–32)
Calcium: 9.1 mg/dL (ref 8.9–10.3)
Chloride: 104 mmol/L (ref 98–111)
Creatinine, Ser: 0.59 mg/dL — ABNORMAL LOW (ref 0.61–1.24)
GFR, Estimated: >60 mL/min
Glucose, Bld: 120 mg/dL — ABNORMAL HIGH (ref 70–99)
Potassium: 3.5 mmol/L (ref 3.5–5.1)
Sodium: 141 mmol/L (ref 135–145)
Total Bilirubin: 1.6 mg/dL — ABNORMAL HIGH (ref 0.0–1.2)
Total Protein: 6.9 g/dL (ref 6.5–8.1)

## 2024-02-21 LAB — CBC
HCT: 32.5 % — ABNORMAL LOW (ref 39.0–52.0)
Hemoglobin: 10.6 g/dL — ABNORMAL LOW (ref 13.0–17.0)
MCH: 29.8 pg (ref 26.0–34.0)
MCHC: 32.6 g/dL (ref 30.0–36.0)
MCV: 91.3 fL (ref 80.0–100.0)
Platelets: 376 K/uL (ref 150–400)
RBC: 3.56 MIL/uL — ABNORMAL LOW (ref 4.22–5.81)
RDW: 13.1 % (ref 11.5–15.5)
WBC: 23.1 K/uL — ABNORMAL HIGH (ref 4.0–10.5)
nRBC: 0 % (ref 0.0–0.2)

## 2024-02-21 MED ORDER — BOOST / RESOURCE BREEZE PO LIQD CUSTOM
1.0000 | Freq: Three times a day (TID) | ORAL | Status: DC
  Administered 2024-02-21 – 2024-02-26 (×8): 1 via ORAL

## 2024-02-21 MED ORDER — SODIUM CHLORIDE 0.9 % IV SOLN
INTRAVENOUS | Status: DC | PRN
  Administered 2024-02-21: 50 mL

## 2024-02-21 MED ORDER — LACTATED RINGERS IV SOLN: INTRAVENOUS | Status: DC | PRN

## 2024-02-21 MED ORDER — DEXAMETHASONE SOD PHOSPHATE PF 10 MG/ML IJ SOLN
INTRAMUSCULAR | Status: DC | PRN
  Administered 2024-02-21: 10 mg via INTRAVENOUS

## 2024-02-21 MED ORDER — MIDAZOLAM HCL 2 MG/2ML IJ SOLN
INTRAMUSCULAR | Status: AC
  Filled 2024-02-21: qty 2

## 2024-02-21 MED ORDER — SENNOSIDES-DOCUSATE SODIUM 8.6-50 MG PO TABS: 1.0000 | ORAL_TABLET | Freq: Two times a day (BID) | ORAL | Status: DC

## 2024-02-21 MED ORDER — SENNOSIDES-DOCUSATE SODIUM 8.6-50 MG PO TABS
1.0000 | ORAL_TABLET | Freq: Every day | ORAL | Status: DC
  Administered 2024-02-21 – 2024-02-22 (×2): 1 via ORAL
  Filled 2024-02-21 (×2): qty 1

## 2024-02-21 MED ORDER — LACTATED RINGERS IV SOLN
INTRAVENOUS | Status: AC | PRN
  Administered 2024-02-21: 1000 mL via INTRAVENOUS

## 2024-02-21 MED ORDER — POLYETHYLENE GLYCOL 3350 17 G PO PACK
17.0000 g | PACK | Freq: Every day | ORAL | Status: DC
  Administered 2024-02-21 – 2024-02-23 (×3): 17 g via ORAL
  Filled 2024-02-21 (×2): qty 1

## 2024-02-21 MED ORDER — LORAZEPAM 2 MG/ML IJ SOLN
0.5000 mg | Freq: Once | INTRAMUSCULAR | Status: AC
  Administered 2024-02-21: 0.5 mg via INTRAVENOUS
  Filled 2024-02-21: qty 1

## 2024-02-21 MED ORDER — DICLOFENAC SUPPOSITORY 100 MG
RECTAL | Status: AC
  Filled 2024-02-21: qty 1

## 2024-02-21 MED ORDER — FENTANYL CITRATE (PF) 100 MCG/2ML IJ SOLN
INTRAMUSCULAR | Status: AC
  Filled 2024-02-21: qty 2

## 2024-02-21 MED ORDER — PROPOFOL 10 MG/ML IV BOLUS
INTRAVENOUS | Status: DC | PRN
  Administered 2024-02-21: 120 mg via INTRAVENOUS

## 2024-02-21 MED ORDER — GLUCAGON HCL RDNA (DIAGNOSTIC) 1 MG IJ SOLR
INTRAMUSCULAR | Status: AC
  Filled 2024-02-21: qty 1

## 2024-02-21 MED ORDER — DICLOFENAC SUPPOSITORY 100 MG
RECTAL | Status: DC | PRN
  Administered 2024-02-21: 100 mg via RECTAL

## 2024-02-21 MED ORDER — PHENYLEPHRINE 80 MCG/ML (10ML) SYRINGE FOR IV PUSH (FOR BLOOD PRESSURE SUPPORT)
PREFILLED_SYRINGE | INTRAVENOUS | Status: DC | PRN
  Administered 2024-02-21 (×3): 80 ug via INTRAVENOUS

## 2024-02-21 MED ORDER — CIPROFLOXACIN IN D5W 400 MG/200ML IV SOLN
INTRAVENOUS | Status: AC
  Filled 2024-02-21: qty 200

## 2024-02-21 MED ORDER — ROCURONIUM BROMIDE 10 MG/ML (PF) SYRINGE
PREFILLED_SYRINGE | INTRAVENOUS | Status: DC | PRN
  Administered 2024-02-21: 40 mg via INTRAVENOUS

## 2024-02-21 MED ORDER — FENTANYL CITRATE (PF) 250 MCG/5ML IJ SOLN
INTRAMUSCULAR | Status: DC | PRN
  Administered 2024-02-21 (×2): 50 ug via INTRAVENOUS

## 2024-02-21 MED ORDER — SUGAMMADEX SODIUM 200 MG/2ML IV SOLN
INTRAVENOUS | Status: DC | PRN
  Administered 2024-02-21: 200 mg via INTRAVENOUS

## 2024-02-21 MED ORDER — MIDAZOLAM HCL (PF) 2 MG/2ML IJ SOLN
INTRAMUSCULAR | Status: DC | PRN
  Administered 2024-02-21: 2 mg via INTRAVENOUS

## 2024-02-21 MED ORDER — LIDOCAINE 2% (20 MG/ML) 5 ML SYRINGE
INTRAMUSCULAR | Status: DC | PRN
  Administered 2024-02-21: 100 mg via INTRAVENOUS

## 2024-02-21 MED ORDER — ONDANSETRON HCL 4 MG/2ML IJ SOLN
INTRAMUSCULAR | Status: DC | PRN
  Administered 2024-02-21: 4 mg via INTRAVENOUS

## 2024-02-21 NOTE — Anesthesia Postprocedure Evaluation (Signed)
"   Anesthesia Post Note  Patient: Nathan Stewart  Procedure(s) Performed: ERCP, WITH INTERVENTION IF INDICATED ERCP, WITH LITHROTRIPSY OR REMOVAL OF COMMON BILE DUCT CALCULUS USING BASKET INSERTION, STENT, BILE DUCT     Patient location during evaluation: PACU Anesthesia Type: General Level of consciousness: sedated and patient cooperative Pain management: pain level controlled Vital Signs Assessment: post-procedure vital signs reviewed and stable Respiratory status: spontaneous breathing Cardiovascular status: stable Anesthetic complications: no   No notable events documented.  Last Vitals:  Vitals:   02/21/24 1050 02/21/24 1100  BP: (!) 155/64 (!) 181/83  Pulse: 84 85  Resp: (!) 23 (!) 28  Temp:    SpO2: 93% 94%    Last Pain:  Vitals:   02/21/24 1141  TempSrc:   PainSc: 5                  [name]      "

## 2024-02-21 NOTE — Progress Notes (Signed)
 "          PROGRESS NOTE  Nathan Stewart FMW:[id number] DOB: [birth year] DOA: 02/16/2024 PCP: [name], MD  Brief History:  68 y.o. 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 He states the pain resolved, but for the last week he has been having nausea and unable to eat. He has been drinking though and able to keep it down. Pt was found to have acute cholecystitis (no stones seen on CT) on a CT scan. His WBC 18.0 in ED. General surgery consulted and pt taken to surgery for lap chole on 02/17/24.    Today, saw patient after ERCP, reports feeling much better, passing gas.  Denies any chest pain, worsening shortness of breath, worsening abdominal pain, denies any nausea/vomiting.    Assessment/Plan: Acute cholecystitis s/p lap chole on 12/23, noted necrotic gallbladder and cystic duct (encountered a bleeding vessel controlled with a clip during surgery) Noted biliary leak s/p lap chole Elevated LFTs s/p lap chole ?Post op ileus Sepsis Currently afebrile, last temp spike on 12/26 101.1, persistent leukocytosis Improved LFTs Procalcitonin 2.76, will trend Abdominal x-ray on 12/24 with mild increase small and large bowel gas without obstruction, possibly mild ileus, noted moderate stool, repeat with generalized ileus Chest x-ray with mild atelectasis MRCP showed postcholecystectomy, noted postoperative fluid/blood products unpacking material HIDA scan findings consistent with biliary leak GI consulted, s/p ERCP on 02/21/2024 showed bile leak in the region of the cystic duct stump, 5 mm choledocholithiasis was found, with complete removal with biliary sphincterotomy and balloon extraction, 1 plastic stent was placed into the bile duct.  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 intake and dehydration Continue IV fluids     Consultants:  general surgery, GI  Code Status:  FULL   DVT Prophylaxis:  SCD   Procedures: As Listed in Progress Note Above  Antibiotics: Zosyn  12/23>>   Objective: Vitals:   02/21/24 1558 02/21/24 1600 02/21/24 1700 02/21/24 1800  BP: (!) 143/61 (!) 143/61 (!) 142/68 (!) 146/62  Pulse: 94 93 74 75  Resp: (!) 26 (!) 27 (!) 22 17  Temp: 98.1 F (36.7 C)     TempSrc: Oral     SpO2: 95% 94% 95% 96%  Weight:      Height:        Intake/Output Summary (Last 24 hours) at 02/21/2024 2016 Last data filed at 02/21/2024 1800 Gross per 24 hour  Intake 1692.6 ml  Output 1420 ml  Net 272.6 ml   Weight change:  Exam: General: NAD  Cardiovascular: S1, S2 present Respiratory: CTAB Abdomen: Soft, tender, distended, no bowel sounds Musculoskeletal: No bilateral pedal edema noted Skin: Normal Psychiatry: Normal mood     Data  Reviewed: I have personally reviewed following labs and imaging studies Basic Metabolic Panel: Recent Labs  Lab 02/17/24 0042 02/18/24 0454 02/19/24 0406 02/20/24 0340 02/21/24 0235  NA 131* 132* 134* 137 141  K 3.3* 3.7 3.8 3.5 3.5  CL 91* 95* 98 100 104  CO2 26 25 23 24 23   GLUCOSE 108* 147* 131* 102* 120*  BUN 12 11 17 23 22   CREATININE 0.81 0.75 0.87 0.73 0.59*  CALCIUM 10.1 9.2 9.1 9.2  9.1  MG 2.2  --   --   --   --   PHOS 3.5  --   --   --   --    Liver Function Tests: Recent Labs  Lab 02/17/24 0042 02/18/24 0454 02/19/24 0406 02/20/24 0340 02/21/24 0235  AST 37 302* 118* 47* 33  ALT 40 414* 333* 202* 135*  ALKPHOS 261* 467* 369* 311* 384*  BILITOT 1.2 5.0* 3.8* 1.8* 1.6*  PROT 8.1 6.9 7.1 6.9 6.9  ALBUMIN  4.0 3.6 3.5 3.2* 3.4*   Recent Labs  Lab 02/16/24 1720  LIPASE 30   No results for input(s): AMMONIA in the last 168 hours. Coagulation Profile: No results for input(s): INR, PROTIME in the last 168 hours. CBC: Recent Labs  Lab 02/18/24 0454 02/18/24 1553 02/19/24 0406 02/20/24 0340 02/21/24 0235  WBC 17.5* 19.6* 20.8* 24.8* 23.1*  NEUTROABS 15.1*  --   --   --   --   HGB 12.0* 12.1* 11.6* 10.6* 10.6*  HCT 34.7* 35.6* 34.7* 31.8* 32.5*  MCV 86.8 87.5 88.3 89.6 91.3  PLT 261 339 334 354 376   Cardiac Enzymes: Recent Labs  Lab 02/17/24 0043  CKTOTAL 228   BNP: Invalid input(s): POCBNP CBG: Recent Labs  Lab 02/18/24 1521  GLUCAP 182*   HbA1C: No results for input(s): HGBA1C in the last 72 hours. Urine analysis:    Component Value Date/Time   COLORURINE YELLOW 02/16/2024 1840   APPEARANCEUR CLEAR 02/16/2024 1840   LABSPEC 1.007 02/16/2024 1840   PHURINE 7.0 02/16/2024 1840   GLUCOSEU NEGATIVE 02/16/2024 1840   HGBUR SMALL (A) 02/16/2024 1840   HGBUR trace-lysed 03/20/2010 0814   BILIRUBINUR NEGATIVE 02/16/2024 1840   BILIRUBINUR small (A) 02/13/2024 1641   BILIRUBINUR n 06/07/2013 1107   KETONESUR NEGATIVE 02/16/2024 1840   PROTEINUR NEGATIVE 02/16/2024 1840   UROBILINOGEN 1.0 02/13/2024 1641   UROBILINOGEN 0.2 11/16/2014 0907   NITRITE NEGATIVE 02/16/2024 1840   LEUKOCYTESUR NEGATIVE 02/16/2024 1840   Sepsis Labs: @LABRCNTIP (procalcitonin:4,lacticidven:4) ) Recent Results (from the past 240 hours)  Urine Culture     Status: None   Collection Time: 02/13/24  5:05 PM   Specimen: Urine, Clean Catch  Result  Value Ref Range Status   Specimen Description   Final    URINE, CLEAN CATCH Performed at [facility], [address] [place], [place] [address]    Special Requests   Final    NONE Performed at [facility], [address] [place], [place] [address]    Culture   Final    NO GROWTH Performed at [facility], [address], [place], [place] [address]    Report Status 02/15/2024 FINAL  Final  Blood culture (routine x 2)     Status: None (Preliminary result)   Collection Time: 02/16/24  5:20 PM   Specimen: BLOOD RIGHT HAND  Result Value Ref Range Status   Specimen Description   Final    BLOOD RIGHT HAND Performed at [facility], [address], [place], [place] [address]    Special Requests   Final    BOTTLES DRAWN AEROBIC AND ANAEROBIC Blood Culture adequate volume Performed at [facility], [address], [place], [place] [address]    Culture   Final    NO GROWTH 4 DAYS Performed at [facility], [address], [place], [place] [address]    Report Status PENDING  Incomplete  Resp panel by RT-PCR (RSV, Flu A&B, Covid) Anterior Nasal Swab     Status: None   Collection Time: 02/16/24  5:20 PM   Specimen: Anterior Nasal Swab  Result Value Ref Range Status   SARS Coronavirus 2 by RT PCR NEGATIVE NEGATIVE Final    Comment: (NOTE) SARS-CoV-2 target nucleic acids are NOT DETECTED.  The SARS-CoV-2 RNA is generally detectable in upper respiratory specimens during the acute phase of infection. The lowest concentration of SARS-CoV-2 viral copies this assay can detect is 138 copies/mL. A negative result does not preclude SARS-Cov-2 infection and should not be used as the sole basis for treatment or other patient management decisions. A negative result may occur with  improper specimen collection/handling, submission of specimen other than nasopharyngeal swab, presence of viral mutation(s) within the areas targeted by this assay, and inadequate  number of viral copies(<138 copies/mL). A negative result must be combined with clinical observations, patient history, and epidemiological information. The expected result is Negative.  Fact Sheet for Patients:  bloggercourse.com  Fact Sheet for Healthcare Providers:  seriousbroker.it  This test is no t yet approved or cleared by the [place]  FDA and  has been authorized for detection and/or diagnosis of SARS-CoV-2 by FDA under an Emergency Use Authorization (EUA). This EUA will remain  in effect (meaning this test can be used) for the duration of the COVID-19 declaration under Section 564(b)(1) of the Act, 21 U.S.C.section 360bbb-3(b)(1), unless the authorization is terminated  or revoked sooner.       Influenza A by PCR NEGATIVE NEGATIVE Final   Influenza B by PCR NEGATIVE NEGATIVE Final    Comment: (NOTE) The Xpert Xpress SARS-CoV-2/FLU/RSV plus assay is intended as an aid in the diagnosis of influenza from Nasopharyngeal swab specimens and should not be used as a sole basis for treatment. Nasal washings and aspirates are unacceptable for Xpert Xpress SARS-CoV-2/FLU/RSV testing.  Fact Sheet for Patients: bloggercourse.com  Fact Sheet for Healthcare Providers: seriousbroker.it  This test is not yet approved or cleared by the [place]  FDA and has been authorized for detection and/or diagnosis of SARS-CoV-2 by FDA under an Emergency Use Authorization (EUA). This EUA will remain in effect (meaning this test can be used) for the duration of the COVID-19 declaration under Section 564(b)(1) of the Act, 21 U.S.C. section 360bbb-3(b)(1), unless the authorization is terminated or revoked.     Resp Syncytial Virus by PCR NEGATIVE NEGATIVE Final    Comment: (NOTE) Fact Sheet for Patients: bloggercourse.com  Fact Sheet for Healthcare  Providers: seriousbroker.it  This test is not yet approved or cleared by the [place]  FDA and has been authorized for detection and/or diagnosis of SARS-CoV-2 by FDA under an Emergency Use Authorization (EUA). This EUA will remain in effect (meaning this test can be used) for the duration of the COVID-19 declaration under Section 564(b)(1) of the Act, 21 U.S.C. section 360bbb-3(b)(1), unless the authorization is terminated or revoked.  Performed at [facility], [address] [place], [place] [address]   Blood culture (routine x  2)     Status: None (Preliminary result)   Collection Time: 02/16/24  5:25 PM   Specimen: BLOOD RIGHT FOREARM  Result Value Ref Range Status   Specimen Description   Final    BLOOD RIGHT FOREARM Performed at [facility], [address], [place], [place] [address]    Special Requests   Final    BOTTLES DRAWN AEROBIC AND ANAEROBIC Blood Culture adequate volume Performed at [facility], [address], [place], [place] [address]    Culture   Final    NO GROWTH 4 DAYS Performed at [facility], [address] [place], [place] [address]    Report Status PENDING  Incomplete  Culture, blood (Routine X 2) w Reflex to ID Panel     Status: None (Preliminary result)   Collection Time: 02/19/24  7:58 AM   Specimen: BLOOD RIGHT ARM  Result Value Ref Range Status   Specimen Description   Final    BLOOD RIGHT ARM Performed at [facility], [address] [place], [place] [address]    Special Requests   Final    BOTTLES DRAWN AEROBIC ONLY Blood Culture results may not be optimal due to an inadequate volume of blood received in culture bottles Performed at [facility], [address], [place], [place] [address]    Culture   Final    NO GROWTH 2 DAYS Performed at [facility], [address], [place], [place] [address]    Report Status PENDING   Incomplete  Culture, blood (Routine X 2) w Reflex to ID Panel     Status: None (Preliminary result)   Collection Time: 02/19/24  7:58 AM   Specimen: BLOOD RIGHT HAND  Result Value Ref Range Status   Specimen Description   Final    BLOOD RIGHT HAND Performed at [facility], [address] [place], [place] [address]    Special Requests   Final    BOTTLES DRAWN AEROBIC ONLY Blood Culture results may not be optimal due to an inadequate volume of blood received in culture bottles Performed at [facility], [address], [place], [place] [address]    Culture   Final    NO GROWTH 2 DAYS Performed at [facility], [address] [place], [place] [address]    Report Status PENDING  Incomplete  MRSA Next Gen by PCR, Nasal     Status: None   Collection Time: 02/20/24  7:24 PM   Specimen: Nasal Mucosa; Nasal Swab  Result Value Ref Range Status   MRSA by PCR Next Gen NOT DETECTED NOT DETECTED Final    Comment: (NOTE) The GeneXpert MRSA Assay (FDA approved for NASAL specimens only), is one component of a comprehensive MRSA colonization surveillance program. It is not intended to diagnose MRSA infection nor to guide or monitor treatment for MRSA infections. Test performance is not FDA approved in patients less than 68 years old. Performed at [facility], [address], [place], [place] [address]      Scheduled Meds:  acetaminophen   1,000 mg Oral Q6H   Chlorhexidine  Gluconate Cloth  6 each Topical Daily   feeding supplement  1 Container Oral TID BM   ipratropium-albuterol   3 mL Nebulization Q6H   metoprolol  succinate  100 mg Oral Daily   polyethylene glycol  17 g Oral Daily   senna-docusate  1 tablet Oral QHS   Continuous  Infusions:  sodium chloride  Stopped (02/21/24 0835)   piperacillin -tazobactam (ZOSYN )  IV Stopped (02/21/24 1544)    Procedures/Studies: CT ABDOMEN PELVIS WO CONTRAST Result Date: 02/20/2024 EXAM: CT ABDOMEN  AND PELVIS WITHOUT CONTRAST 02/20/2024 01:22:00 PM TECHNIQUE: CT of the abdomen and pelvis was performed without the administration of intravenous contrast. Multiplanar reformatted images are provided for review. Automated exposure control, iterative reconstruction, and/or weight-based adjustment of the mA/kV was utilized to reduce the radiation dose to as low as reasonably achievable. COMPARISON: HIDA scan dated 02/20/2024. CLINICAL HISTORY: Abdominal pain, post-op.Postoperative cholecystectomy day 3. FINDINGS: LOWER CHEST: Mild bibasilar atelectasis. LIVER: The liver is unremarkable. GALLBLADDER AND BILE DUCTS: Collection in the gallbladder fossa measures 6.1 x 4.8 cm. The collection is low density. Percutaneous drain extends through the fluid collection. Cholecystectomy clips in the region of the cystic duct. There is no biliary ductal dilatation. The common bile duct is normal caliber. SPLEEN: No acute abnormality. PANCREAS: No evidence of pancreatitis on noncontrast exam. ADRENAL GLANDS: No acute abnormality. KIDNEYS, URETERS AND BLADDER: No stones in the kidneys or ureters. No hydronephrosis. No perinephric or periureteral stranding. Urinary bladder is unremarkable. GI AND BOWEL: Stomach is normal. Duodenum appears normal. The bowel is fluid filled with air-fluid levels suggesting postoperative ileus. No pneumatosis. Stool in the rectum. PERITONEUM AND RETROPERITONEUM: Small amount of scattered gas in the upper peritoneal space related to recent laparoscopic surgery. Small amount of high density fluid collects dependently within the posterior cul de sac with a fluid-fluid level. Small volume hemoperitoneum presumably related to recent surgery. VASCULATURE: Aorta is normal in caliber. LYMPH NODES: No lymphadenopathy. REPRODUCTIVE ORGANS: No acute abnormality. BONES AND SOFT TISSUES: No acute osseous abnormality. No focal soft tissue abnormality. IMPRESSION: 1. Collection in the gallbladder fossa measuring 6.1 x  4.8 cm with percutaneous drain in place. Findings consistent with predominantly contained bowel gas. 2. Postoperative ileus. 3. Small volume hemoperitoneum complex in the posterior cul de sac , presumably related to recent surgery. 4. These results will be called to the ordering clinician or representative by the radiologist assistant, and communication documented in the pacs or clario dashboard Electronically signed by: Norleen Boxer MD 02/20/2024 03:59 PM EST RP Workstation: [id number]   DG Abd Portable 1V Result Date: 02/20/2024 CLINICAL DATA:  Abdominal distension. EXAM: PORTABLE ABDOMEN - 1 VIEW COMPARISON:  02/18/2024 FINDINGS: Increasing gaseous distention of bowel loops in the central abdomen, primarily colonic distension, however prominent air-filled loops of small bowel are also seen. Drain in the right mid abdomen. Right upper quadrant surgical clips. IMPRESSION: Increasing gaseous distention of bowel loops in the central abdomen, primarily colonic distension, however prominent air-filled loops of small bowel are also seen. Favor generalized ileus. Electronically Signed   By: Andrea Gasman M.D.   On: 02/20/2024 14:05   NM HEPATOBILIARY LEAK (POST-SURGICAL) Result Date: 02/20/2024 EXAM: NM HEPATOBILLARY SCAN 02/20/2024 12:50:37 PM TECHNIQUE: RADIOPHARMACEUTICAL: 5.4 mCi Tc-30m mebrofenin  Dynamic images of the abdomen and pelvis were obtained in the anterior projection for 1 hour after intravenous administration of radiopharmaceutical. The biliary drain was clamped for the second hour of the exam and imaging continued for at least 30 minutes. COMPARISON: None available. CLINICAL HISTORY: Abdominal pain, post surgery or trauma, bile leak suspected. FINDINGS: Homogenous uptake within the liver. Normal clearance of the blood pool. Initial imaging demonstrates excreted radiotracer in the common hepatic duct with evidence of radiotracer within the biliary drain. The drain was clamped for the second  hour of the exam. With the drain clamped, extra  biliary radiotracer collects within the gallbladder fossa. Findings consistent with contained bowel leak within the gallbladder fossa. Radiotracer evacuating through the biliary drain when unclamped. No activity in the small bowel. IMPRESSION: 1. Radiotracer evacuates through the biliary drain when unclamped. 2. With drain clamped, extra biliary radiotracer collects within the gallbladder fossa. 3. Findings consistent with biliary leak . No activity in the small bowel suggests brisk leak. 4. These results will be called to the ordering clinician or representative by the radiologist assistant, and communication documented in the pacs or clario dashboard Electronically signed by: Norleen Boxer MD 02/20/2024 01:11 PM EST RP Workstation: [id number]   MR ABDOMEN MRCP WO CONTRAST Result Date: 02/19/2024 CLINICAL DATA:  Postop day 1 from laparoscopic cholecystectomy. Elevated LFTs. Clinical concern for retained common bile duct stone. EXAM: MRI ABDOMEN WITHOUT CONTRAST  (INCLUDING MRCP) TECHNIQUE: Multiplanar multisequence MR imaging of the abdomen was performed. Heavily T2-weighted images of the biliary and pancreatic ducts were obtained, and three-dimensional MRCP images were rendered by post processing. COMPARISON:  Abdomen and pelvis CT 02/16/2024 FINDINGS: Lower chest: Dependent atelectasis with tiny right pleural effusion. Hepatobiliary: No suspicious focal abnormality in the liver on this noncontrast study. There is some periportal edema with edema in the inferior liver around the gallbladder fossa. Heterogeneous signal intensity in the gallbladder fossa likely reflects a combination of postoperative fluid/blood products in packing material. No substantial intrahepatic biliary duct dilatation. Common duct measures 7 mm diameter in the porta hepatis. Common bile duct just proximal to the ampulla is 7 mm diameter. There is no evidence for choledocholithiasis.  Pancreas: No focal mass lesion. No dilatation of the main duct. No intraparenchymal cyst. No peripancreatic edema. Edema around the duodenum and pancreatic head is 2 compatible with recent surgery. Spleen:  No splenomegaly. No suspicious focal mass lesion. Adrenals/Urinary Tract: No adrenal nodule or mass. Bilateral renal cysts evident. Stomach/Bowel: Stomach is moderately distended with fluid. Small bowel and colon shows mild diffuse gaseous distension. Vascular/Lymphatic: No abdominal aortic aneurysm small retroperitoneal lymph nodes are similar to recent CT scan. Other: Edema in the anterior abdominal wall is compatible with the recent surgical access. There is dependent subcutaneous body wall edema bilaterally bilaterally. Musculoskeletal: No overtly suspicious marrow signal abnormality. IMPRESSION: 1. Postop day 1 from cholecystectomy. Heterogeneous signal intensity in the gallbladder fossa likely reflects a combination of postoperative fluid/blood products and packing material. 2. No substantial intrahepatic biliary duct dilatation. Common duct measures 7 mm diameter in the porta hepatis and just proximal to the ampulla. No evidence for choledocholithiasis. 3. Mild diffuse gaseous distension of the small bowel and colon compatible with mild postoperative ileus. 4. Dependent atelectasis with tiny right pleural effusion. Electronically Signed   By: Camellia Candle M.D.   On: 02/19/2024 05:32   DG Abd Portable 1V Result Date: 02/18/2024 CLINICAL DATA:  Distension postop cholecystostomy EXAM: PORTABLE ABDOMEN - 1 VIEW COMPARISON:  CT 02/16/2024 FINDINGS: Postop changes in the right upper quadrant with clips and probable drainage catheter. Mild increased small and large bowel gas without obstruction. Moderate stool IMPRESSION: Mild increased small and large bowel gas without obstruction, possible mild ileus. Moderate stool. Electronically Signed   By: Luke Bun M.D.   On: 02/18/2024 15:59   DG Chest Port 1  View Result Date: 02/18/2024 CLINICAL DATA:  Shortness of breath EXAM: PORTABLE CHEST 1 VIEW COMPARISON:  02/16/2024 FINDINGS: Hypoventilatory change. Minimal atelectasis right base. No consolidation, pleural effusion or pneumothorax. Normal cardiac size. Aortic atherosclerosis. Left shoulder replacement IMPRESSION: Hypoventilatory changes  with minimal atelectasis at the right base. Electronically Signed   By: Luke Bun M.D.   On: 02/18/2024 15:58   CT ABDOMEN PELVIS W CONTRAST Result Date: 02/16/2024 EXAM: CT ABDOMEN AND PELVIS WITH CONTRAST 02/16/2024 07:57:25 PM TECHNIQUE: CT of the abdomen and pelvis was performed with the administration of 100 mL of iohexol  (OMNIPAQUE ) 350 MG/ML injection. Multiplanar reformatted images are provided for review. Automated exposure control, iterative reconstruction, and/or weight-based adjustment of the mA/kV was utilized to reduce the radiation dose to as low as reasonably achievable. COMPARISON: None available. CLINICAL HISTORY: Epigastric pain; cp, epigastric abd pain, leukocytosis. FINDINGS: LOWER CHEST: No acute abnormality. LIVER: Hepatic steatosis. GALLBLADDER AND BILE DUCTS: Distended gallbladder with wall thickening and pericholecystic fluid compatible with acute cholecystitis. No radiopaque stone or biliary ductal dilatation. SPLEEN: No acute abnormality. PANCREAS: No acute abnormality. ADRENAL GLANDS: No acute abnormality. KIDNEYS, URETERS AND BLADDER: No stones in the kidneys or ureters. No hydronephrosis. No perinephric or periureteral stranding. Urinary bladder is unremarkable. GI AND BOWEL: Stomach demonstrates no acute abnormality. Colonic diverticulosis without evidence of diverticulitis. There is no bowel obstruction. PERITONEUM AND RETROPERITONEUM: No ascites. No free air. VASCULATURE: Aorta is normal in caliber. Aortic atherosclerotic calcification. LYMPH NODES: No lymphadenopathy. REPRODUCTIVE ORGANS: No acute abnormality. BONES AND SOFT TISSUES: No  acute osseous abnormality. No focal soft tissue abnormality. IMPRESSION: 1. Acute cholecystitis. Electronically signed by: Norman Gatlin MD 02/16/2024 08:10 PM EST RP Workstation: [id number]   CT Angio Chest PE W and/or Wo Contrast Result Date: 02/16/2024 EXAM: CTA CHEST 02/16/2024 07:57:25 PM TECHNIQUE: CTA of the chest was performed with the administration of 100 mL of iohexol  (OMNIPAQUE ) 350 MG/ML injection. Multiplanar reformatted images are provided for review. MIP images are provided for review. Automated exposure control, iterative reconstruction, and/or weight based adjustment of the mA/kV was utilized to reduce the radiation dose to as low as reasonably achievable. COMPARISON: Comparison with the same day x-ray. CLINICAL HISTORY: Pulmonary embolism (PE) suspected, high prob; cough, cp, abd pain. FINDINGS: PULMONARY ARTERIES: Pulmonary arteries are adequately opacified for evaluation. No acute pulmonary embolus. Main pulmonary artery is normal in caliber. MEDIASTINUM: Coronary artery and aortic atherosclerotic calcification. The heart and pericardium demonstrate no acute abnormality. There is no acute abnormality of the thoracic aorta. LYMPH NODES: No mediastinal, hilar or axillary lymphadenopathy. LUNGS AND PLEURA: Emphysema. Right lower lobe atelectasis. No evidence of pleural effusion or pneumothorax. UPPER ABDOMEN: Hepatic steatosis. SOFT TISSUES AND BONES: No acute bone or soft tissue abnormality. IMPRESSION: 1. No pulmonary embolism. 2. Emphysema. Pulmonary emphysema is an independent risk factor for lung cancer. Recommend consideration for evaluation for a low-dose CT lung cancer screening program. Electronically signed by: Norman Gatlin MD 02/16/2024 08:07 PM EST RP Workstation: [id number]   DG Chest Portable 1 View Result Date: 02/16/2024 CLINICAL DATA:  Cough. EXAM: PORTABLE CHEST 1 VIEW COMPARISON:  05/11/2021. FINDINGS: The heart size and mediastinal contours are within normal limits.  There is elevation of the right diaphragm. Atelectasis is noted at the lung bases bilaterally. No effusion or pneumothorax is seen. 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 IMPRESSION: Elevation of the right diaphragm with atelectasis at the lung bases. Electronically Signed   By: Leita Birmingham M.D.   On: 02/16/2024 17:48    Lebron JINNY Cage, MD  [facility]  If 7PM-7AM, please contact night-coverage [email or web address] 02/21/2024, 8:16 PM   LOS: 5 days   "

## 2024-02-21 NOTE — Anesthesia Preprocedure Evaluation (Addendum)
"                                    Anesthesia Evaluation  Patient identified by MRN, date of birth, ID band Patient awake    Reviewed: Allergy & Precautions, NPO status , Patient's Chart, lab work & pertinent test results  History of Anesthesia Complications Negative for: history of anesthetic complications  Airway Mallampati: II  TM Distance: >3 FB Neck ROM: Limited    Dental  (+) Dental Advisory Given, Teeth Intact   Pulmonary sleep apnea (noncomplaint) , former smoker   breath sounds clear to auscultation + decreased breath sounds      Cardiovascular hypertension, Pt. on medications and Pt. on home beta blockers  Rhythm:Regular Rate:Tachycardia     Neuro/Psych  Prev cervical fusion   negative psych ROS   GI/Hepatic ,GERD  Controlled,,(+) Hepatitis -  Endo/Other   Na 131 K 3.3   Renal/GU negative Renal ROS     Musculoskeletal  (+) Arthritis ,    Abdominal   Peds  Hematology  (+) Blood dyscrasia, anemia   Anesthesia Other Findings   Reproductive/Obstetrics                              Anesthesia Physical Anesthesia Plan  ASA: 3  Anesthesia Plan: General   Post-op Pain Management: Minimal or no pain anticipated   Induction: Intravenous  PONV Risk Score and Plan: 2 and Treatment may vary due to age or medical condition, Ondansetron  and Dexamethasone   Airway Management Planned: Oral ETT  Additional Equipment: None  Intra-op Plan:   Post-operative Plan: Extubation in OR  Informed Consent: I have reviewed the patients History and Physical, chart, labs and discussed the procedure including the risks, benefits and alternatives for the proposed anesthesia with the patient or authorized representative who has indicated his/her understanding and acceptance.     Dental advisory given  Plan Discussed with: CRNA  Anesthesia Plan Comments:          Anesthesia Quick Evaluation  "

## 2024-02-21 NOTE — Transfer of Care (Signed)
 Immediate Anesthesia Transfer of Care Note  Patient: Nathan Stewart  Procedure(s) Performed: ERCP, WITH INTERVENTION IF INDICATED ERCP, WITH LITHROTRIPSY OR REMOVAL OF COMMON BILE DUCT CALCULUS USING BASKET INSERTION, STENT, BILE DUCT  Patient Location: Endoscopy Unit  Anesthesia Type:General  Level of Consciousness: awake  Airway & Oxygen Therapy: Patient Spontanous Breathing and Patient connected to nasal cannula oxygen  Post-op Assessment: Report given to RN and Post -op Vital signs reviewed and stable  Post vital signs: Reviewed and stable  Last Vitals:  Vitals Value Taken Time  BP 152/68 02/21/24 10:43  Temp 36.9 C 02/21/24 10:43  Pulse 89 02/21/24 10:43  Resp 19 02/21/24 10:43  SpO2 95 % 02/21/24 10:43  Vitals shown include unfiled device data.  Last Pain:  Vitals:   02/21/24 1043  TempSrc: Temporal  PainSc: 0-No pain      Patients Stated Pain Goal: 5 (02/21/24 0740)  Complications: No notable events documented.

## 2024-02-21 NOTE — Op Note (Addendum)
 [facility] Patient Name: Nathan Stewart Procedure Date: 02/21/2024 MRN: [id number] Attending MD: Nathan Stewart , MD, [id number] Date of Birth: 10-08-1955 CSN: [id number] Age: 68 Admit Type: Inpatient Procedure:                ERCP Indications:              Bile leak, Treatment of bile leak Providers:                Nathan CHARLENA Commander, MD, Almarie Masters, RN, Joya Bunnell, Technician Referring MD:              Medicines:                General Anesthesia, Zosyn  intermittent continuous,                            diclofenac  100 mg per rectum Complications:            No immediate complications. Estimated Blood Loss:     Estimated blood loss: none. Procedure:                Pre-Anesthesia Assessment:                           - Prior to the procedure, a History and Physical                            was performed, and patient medications and                            allergies were reviewed. The patient's tolerance of                            previous anesthesia was also reviewed. The risks                            and benefits of the procedure and the sedation                            options and risks were discussed with the patient.                            All questions were answered, and informed consent                            was obtained. Prior Anticoagulants: The patient                            last took Lovenox  (enoxaparin ) 1 day prior to the                            procedure. ASA Grade Assessment: III - A patient  with severe systemic disease. After reviewing the                            risks and benefits, the patient was deemed in                            satisfactory condition to undergo the procedure.                           After obtaining informed consent, the scope was                            passed under direct vision. Throughout the                            procedure,  the patient's blood pressure, pulse, and                            oxygen saturations were monitored continuously. The                            [employer] D single use                            duodenoscope was introduced through the mouth, and                            used to inject contrast into and used to inject                            contrast into the bile duct. The ERCP was somewhat                            difficult due to stomach shape,. Successful                            completion of the procedure was aided by Changing                            from Exalt to Olympus duodenoscope. The patient                            tolerated the procedure well. Scope In: Scope Out: Findings:      A scout film of the abdomen was obtained. Surgical clips and a drain,       consistent with a previous cholecystectomy, were seen in the area of the       right upper quadrant of the abdomen. The Exalt scope was initially       inserted but I could not reach beyond the duodenal bulb with this. We       actually had to lift the patient in the left lateral decubitus as well.       I changed to the Olympus scope and was able to enter the second portion  of the duodenum and find the papilla. The esophagus was not seen well       though there was a small hiatal hernia appreciated. Stomach was grossly       normal. Dark secretions seen scattered about. Duodenal bulb normal as       was the second portion of the duodenum. The major papilla was swollen       and somewhat tense, it was cannulated on the first try with       sphincterotome and wire using the Rx 44 system with 0.035 Hydra Jagwire.       Contrast was injected and confirmed the leak which we knew from the HIDA       scan from the cystic duct area. There was a filling defect moving around       in the distal bile duct. The common bile duct was approximately 7 to 8       mm in size, there was no significant  dilation otherwise. Pus emanated       from the papilla. I then performed a biliary sphincterotomy cutting in       the 10 to 12 o'clock position. No bleeding. More pus and bile seen. I       then used a 9-12 mm extraction balloon and removed the stone and there       were no other objects discovered. The stone was pigmented and       approximately 5 mm. Occlusion cholangiogram negative for other stones. I       then placed a 5 cm 10 French plastic biliary stent. Pancreas not       entered. I personally interpreted the images. Impression:               - A bile leak was found in the region of the cystic                            duct stump                           - Choledocholithiasis was found. Single 5 mm stone                            complete removal was accomplished by biliary                            sphincterotomy and balloon extraction.                           - One plastic stent was placed into the bile duct.                            5 cm 10 French stent Moderate Sedation:      Not Applicable - Patient had care per Anesthesia. Recommendation:           - Return patient to hospital ward for ongoing care.                           - Watch drain output hopefully it will go down with  the sphincterotomy and stent placement.                           - Continue Zosyn                            - Allow clear liquids today                           - I will follow-up tomorrow                           - Stent removal as outpatient 2 to 3 months barring                            unforeseen issues.                           - I updated his wife Santa by phone                           - DC Lovenox , SCD's ordered - can likely restart                            Lovenox  1-2 days Procedure Code(s):        --- Professional ---                           [phone], Endoscopic retrograde                            cholangiopancreatography (ERCP); with placement  of                            endoscopic stent into biliary or pancreatic duct,                            including pre- and post-dilation and guide wire                            passage, when performed, including sphincterotomy,                            when performed, each stent                           43264, Endoscopic retrograde                            cholangiopancreatography (ERCP); with removal of                            calculi/debris from biliary/pancreatic duct(s)                           25671, Endoscopic catheterization of the biliary  ductal system, radiological supervision and                            interpretation Diagnosis Code(s):        --- Professional ---                           K83.9, Disease of biliary tract, unspecified                           K80.50, Calculus of bile duct without cholangitis                            or cholecystitis without obstruction                           K83.8, Other specified diseases of biliary tract CPT copyright 2022 [employer]. All rights reserved. The codes documented in this report are preliminary and upon coder review may  be revised to meet current compliance requirements. Nathan FORBES Commander, MD 02/21/2024 10:45:56 AM This report has been signed electronically. Number of Addenda: 0

## 2024-02-21 NOTE — Interval H&P Note (Signed)
 History and Physical Interval Note:  02/21/2024 8:56 AM  Nathan Stewart  has presented today for surgery, with the diagnosis of Bile Leak.  The various methods of treatment have been discussed with the patient and family. After consideration of risks, benefits and other options for treatment, the patient has consented to  Procedures: ERCP, WITH INTERVENTION IF INDICATED (N/A) as a surgical intervention.  The patient's history has been reviewed, patient examined, no change in status, stable for surgery.  I have reviewed the patient's chart and labs.  Questions were answered to the patient's satisfaction.     Lupita Commander

## 2024-02-21 NOTE — Anesthesia Procedure Notes (Signed)
 Procedure Name: Intubation Date/Time: 02/21/2024 9:29 AM  Performed by: Cena Epps, CRNAPre-anesthesia Checklist: Patient identified, Emergency Drugs available, Suction available and Patient being monitored Patient Re-evaluated:Patient Re-evaluated prior to induction Oxygen Delivery Method: Circle System Utilized Preoxygenation: Pre-oxygenation with 100% oxygen Induction Type: IV induction Ventilation: Mask ventilation without difficulty Laryngoscope Size: Mac and 4 Grade View: Grade I Tube type: Oral Tube size: 7.5 mm Number of attempts: 1 Airway Equipment and Method: Stylet and Oral airway Placement Confirmation: ETT inserted through vocal cords under direct vision, positive ETCO2 and breath sounds checked- equal and bilateral Secured at: 24 cm Tube secured with: Tape Dental Injury: Teeth and Oropharynx as per pre-operative assessment

## 2024-02-22 ENCOUNTER — Inpatient Hospital Stay (HOSPITAL_COMMUNITY)

## 2024-02-22 ENCOUNTER — Encounter (HOSPITAL_COMMUNITY): Payer: Self-pay | Admitting: Internal Medicine

## 2024-02-22 DIAGNOSIS — K567 Ileus, unspecified: Secondary | ICD-10-CM | POA: Diagnosis not present

## 2024-02-22 DIAGNOSIS — K8309 Other cholangitis: Secondary | ICD-10-CM | POA: Diagnosis not present

## 2024-02-22 DIAGNOSIS — K9189 Other postprocedural complications and disorders of digestive system: Secondary | ICD-10-CM | POA: Diagnosis not present

## 2024-02-22 DIAGNOSIS — K8050 Calculus of bile duct without cholangitis or cholecystitis without obstruction: Secondary | ICD-10-CM | POA: Diagnosis not present

## 2024-02-22 DIAGNOSIS — K810 Acute cholecystitis: Secondary | ICD-10-CM | POA: Diagnosis not present

## 2024-02-22 LAB — COMPREHENSIVE METABOLIC PANEL WITH GFR
ALT: 95 U/L — ABNORMAL HIGH (ref 0–44)
AST: 33 U/L (ref 15–41)
Albumin: 3.2 g/dL — ABNORMAL LOW (ref 3.5–5.0)
Alkaline Phosphatase: 185 U/L — ABNORMAL HIGH (ref 38–126)
Anion gap: 14 (ref 5–15)
BUN: 19 mg/dL (ref 8–23)
CO2: 23 mmol/L (ref 22–32)
Calcium: 8.8 mg/dL — ABNORMAL LOW (ref 8.9–10.3)
Chloride: 102 mmol/L (ref 98–111)
Creatinine, Ser: 0.72 mg/dL (ref 0.61–1.24)
GFR, Estimated: >60 mL/min
Glucose, Bld: 132 mg/dL — ABNORMAL HIGH (ref 70–99)
Potassium: 3.6 mmol/L (ref 3.5–5.1)
Sodium: 139 mmol/L (ref 135–145)
Total Bilirubin: 1.2 mg/dL (ref 0.0–1.2)
Total Protein: 6.4 g/dL — ABNORMAL LOW (ref 6.5–8.1)

## 2024-02-22 LAB — CBC
HCT: 29.4 % — ABNORMAL LOW (ref 39.0–52.0)
Hemoglobin: 9.3 g/dL — ABNORMAL LOW (ref 13.0–17.0)
MCH: 29.1 pg (ref 26.0–34.0)
MCHC: 31.6 g/dL (ref 30.0–36.0)
MCV: 91.9 fL (ref 80.0–100.0)
Platelets: 373 K/uL (ref 150–400)
RBC: 3.2 MIL/uL — ABNORMAL LOW (ref 4.22–5.81)
RDW: 13.2 % (ref 11.5–15.5)
WBC: 17.1 K/uL — ABNORMAL HIGH (ref 4.0–10.5)
nRBC: 0 % (ref 0.0–0.2)

## 2024-02-22 LAB — CULTURE, BLOOD (ROUTINE X 2)
Culture: NO GROWTH
Culture: NO GROWTH
Special Requests: ADEQUATE
Special Requests: ADEQUATE

## 2024-02-22 LAB — PROCALCITONIN: Procalcitonin: 1.22 ng/mL

## 2024-02-22 MED ORDER — IPRATROPIUM-ALBUTEROL 0.5-2.5 (3) MG/3ML IN SOLN
3.0000 mL | Freq: Three times a day (TID) | RESPIRATORY_TRACT | Status: DC
  Administered 2024-02-22 – 2024-02-23 (×4): 3 mL via RESPIRATORY_TRACT
  Filled 2024-02-22 (×3): qty 3

## 2024-02-22 MED ORDER — METOPROLOL SUCCINATE ER 50 MG PO TB24
50.0000 mg | ORAL_TABLET | Freq: Every day | ORAL | Status: DC
  Administered 2024-02-23 – 2024-02-27 (×5): 50 mg via ORAL

## 2024-02-22 NOTE — Progress Notes (Addendum)
 "          PROGRESS NOTE  Nathan Stewart FMW:[id number] DOB: October 09, 1955 DOA: 02/16/2024 PCP: [name], MD  Brief History:  68 y.o. 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 He states the pain resolved, but for the last week he has been having nausea and unable to eat. He has been drinking though and able to keep it down. Pt was found to have acute cholecystitis (no stones seen on CT) on a CT scan. His WBC 18.0 in ED. General surgery consulted and pt taken to surgery for lap chole on 02/17/24.     Today, patient denies any new complaints, continues to pass gas but no BM.  Reduced bowel sounds noted.  Discussed extensively the need to use the incentive spirometer given atelectasis.  Patient is ambulating.  Wife at bedside.    Assessment/Plan: Acute cholecystitis s/p lap chole on 12/23, noted necrotic gallbladder and cystic duct (encountered a bleeding vessel controlled with a clip during surgery) Noted biliary leak s/p lap chole Elevated LFTs s/p lap chole ?Post op ileus Sepsis Currently afebrile, resolving leukocytosis Improved LFTs Procalcitonin 2.76--->1.22 Abdominal x-ray on 12/24 with mild increase small and large bowel gas without obstruction, possibly mild ileus, noted moderate stool, repeat with generalized ileus Chest x-ray with mild atelectasis HIDA scan findings consistent with biliary leak GI consulted, s/p ERCP on 02/21/2024 showed bile leak in the region of the cystic duct stump, 5 mm choledocholithiasis was found, with complete removal with biliary sphincterotomy and balloon extraction, 1 plastic stent was placed into the bile duct.  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 intake and dehydration     Consultants:  general surgery, GI  Code Status:  FULL   DVT Prophylaxis:  SCD   Procedures: As Listed in Progress Note Above  Antibiotics: Zosyn  12/23>>   Objective: Vitals:   02/22/24 1452 02/22/24 1500 02/22/24 1520 02/22/24 1600  BP:  (!) 152/59  139/60  Pulse:  (!) 58  67  Resp:  20  19  Temp:   98.6 F (37 C)   TempSrc:   Oral   SpO2: 96% 100%  97%  Weight:      Height:        Intake/Output Summary (Last 24 hours) at 02/22/2024 1716 Last data filed at 02/22/2024 1200 Gross per 24 hour  Intake 1862.91 ml  Output 590 ml  Net 1272.91 ml   Weight change:  Exam: General: NAD  Cardiovascular: S1, S2 present Respiratory: CTAB Abdomen: Soft, tender, distended, no bowel sounds Musculoskeletal: No bilateral pedal edema noted Skin: Normal Psychiatry: Normal mood     Data Reviewed: I have personally reviewed following labs  and imaging studies Basic Metabolic Panel: Recent Labs  Lab 02/17/24 0042 02/18/24 0454 02/19/24 0406 02/20/24 0340 02/21/24 0235 02/22/24 0300  NA 131* 132* 134* 137 141 139  K 3.3* 3.7 3.8 3.5 3.5 3.6  CL 91* 95* 98 100 104 102  CO2 26 25 23 24 23 23   GLUCOSE 108* 147* 131* 102* 120* 132*  BUN 12 11 17 23 22 19   CREATININE 0.81 0.75 0.87 0.73 0.59* 0.72  CALCIUM 10.1 9.2 9.1 9.2 9.1 8.8*  MG 2.2  --   --   --   --   --   PHOS  3.5  --   --   --   --   --    Liver Function Tests: Recent Labs  Lab 02/18/24 0454 02/19/24 0406 02/20/24 0340 02/21/24 0235 02/22/24 0300  AST 302* 118* 47* 33 33  ALT 414* 333* 202* 135* 95*  ALKPHOS 467* 369* 311* 384* 185*  BILITOT 5.0* 3.8* 1.8* 1.6* 1.2  PROT 6.9 7.1 6.9 6.9 6.4*  ALBUMIN  3.6 3.5 3.2* 3.4* 3.2*   Recent Labs  Lab 02/16/24 1720  LIPASE 30   No results for input(s): AMMONIA in the last 168 hours. Coagulation Profile: No results for input(s): INR, PROTIME in the last 168 hours. CBC: Recent Labs  Lab 02/18/24 0454 02/18/24 1553 02/19/24 0406 02/20/24 0340 02/21/24 0235 02/22/24 0300  WBC 17.5* 19.6* 20.8* 24.8* 23.1* 17.1*  NEUTROABS 15.1*  --   --   --   --   --   HGB 12.0* 12.1* 11.6* 10.6* 10.6* 9.3*  HCT 34.7* 35.6* 34.7* 31.8* 32.5* 29.4*  MCV 86.8 87.5 88.3 89.6 91.3 91.9  PLT 261 339 334 354 376 373   Cardiac Enzymes: Recent Labs  Lab 02/17/24 0043  CKTOTAL 228   BNP: Invalid input(s): POCBNP CBG: Recent Labs  Lab 02/18/24 1521  GLUCAP 182*   HbA1C: No results for input(s): HGBA1C in the last 72 hours. Urine analysis:    Component Value Date/Time   COLORURINE YELLOW 02/16/2024 1840   APPEARANCEUR CLEAR 02/16/2024 1840   LABSPEC 1.007 02/16/2024 1840   PHURINE 7.0 02/16/2024 1840   GLUCOSEU NEGATIVE 02/16/2024 1840   HGBUR SMALL (A) 02/16/2024 1840   HGBUR trace-lysed 03/20/2010 0814   BILIRUBINUR NEGATIVE 02/16/2024 1840   BILIRUBINUR small (A) 02/13/2024 1641   BILIRUBINUR n 06/07/2013 1107   KETONESUR NEGATIVE 02/16/2024 1840   PROTEINUR NEGATIVE 02/16/2024 1840   UROBILINOGEN 1.0 02/13/2024 1641   UROBILINOGEN 0.2 11/16/2014 0907   NITRITE NEGATIVE 02/16/2024 1840   LEUKOCYTESUR NEGATIVE 02/16/2024 1840   Sepsis Labs: @LABRCNTIP (procalcitonin:4,lacticidven:4) ) Recent Results (from the past 240 hours)  Urine Culture     Status: None   Collection Time: 02/13/24  5:05 PM   Specimen: Urine, Clean  Catch  Result Value Ref Range Status   Specimen Description   Final    URINE, CLEAN CATCH Performed at [facility], [address], [place], [place] [address]    Special Requests   Final    NONE Performed at [facility], [address], [place], [place] [address]    Culture   Final    NO GROWTH Performed at [facility], [address], [place], [place] [address]    Report Status 02/15/2024 FINAL  Final  Blood culture (routine x 2)     Status: None   Collection Time: 02/16/24  5:20 PM   Specimen: BLOOD RIGHT HAND  Result Value Ref Range Status  Specimen Description   Final    BLOOD RIGHT HAND Performed at [facility], [address] [place], [place] [address]    Special Requests   Final    BOTTLES DRAWN AEROBIC AND ANAEROBIC Blood Culture adequate volume Performed at [facility], [address], [place], [place] [address]    Culture   Final    NO GROWTH 5 DAYS Performed at [facility], [address], [place], [place] [address]    Report Status 02/22/2024 FINAL  Final  Resp panel by RT-PCR (RSV, Flu A&B, Covid) Anterior Nasal Swab     Status: None   Collection Time: 02/16/24  5:20 PM   Specimen: Anterior Nasal Swab  Result Value Ref Range Status   SARS Coronavirus 2 by RT PCR NEGATIVE NEGATIVE Final    Comment: (NOTE) SARS-CoV-2 target nucleic acids are NOT DETECTED.  The SARS-CoV-2 RNA is generally detectable in upper respiratory specimens during the acute phase of infection. The lowest concentration of SARS-CoV-2 viral copies this assay can detect is 138 copies/mL. A negative result does not preclude SARS-Cov-2 infection and should not be used as the sole basis for treatment or other patient management decisions. A negative result may occur with  improper specimen collection/handling, submission of specimen other than nasopharyngeal swab, presence of viral mutation(s) within the areas targeted by this assay, and inadequate  number of viral copies(<138 copies/mL). A negative result must be combined with clinical observations, patient history, and epidemiological information. The expected result is Negative.  Fact Sheet for Patients:  bloggercourse.com  Fact Sheet for Healthcare Providers:  seriousbroker.it  This test is no t yet approved or cleared by the [place]  FDA and  has been authorized for detection and/or diagnosis of SARS-CoV-2 by FDA under an Emergency Use Authorization (EUA). This EUA will remain  in effect (meaning this test can be used) for the duration of the COVID-19 declaration under Section 564(b)(1) of the Act, 21 U.S.C.section 360bbb-3(b)(1), unless the authorization is terminated  or revoked sooner.       Influenza A by PCR NEGATIVE NEGATIVE Final   Influenza B by PCR NEGATIVE NEGATIVE Final    Comment: (NOTE) The Xpert Xpress SARS-CoV-2/FLU/RSV plus assay is intended as an aid in the diagnosis of influenza from Nasopharyngeal swab specimens and should not be used as a sole basis for treatment. Nasal washings and aspirates are unacceptable for Xpert Xpress SARS-CoV-2/FLU/RSV testing.  Fact Sheet for Patients: bloggercourse.com  Fact Sheet for Healthcare Providers: seriousbroker.it  This test is not yet approved or cleared by the [place]  FDA and has been authorized for detection and/or diagnosis of SARS-CoV-2 by FDA under an Emergency Use Authorization (EUA). This EUA will remain in effect (meaning this test can be used) for the duration of the COVID-19 declaration under Section 564(b)(1) of the Act, 21 U.S.C. section 360bbb-3(b)(1), unless the authorization is terminated or revoked.     Resp Syncytial Virus by PCR NEGATIVE NEGATIVE Final    Comment: (NOTE) Fact Sheet for Patients: bloggercourse.com  Fact Sheet for Healthcare  Providers: seriousbroker.it  This test is not yet approved or cleared by the [place]  FDA and has been authorized for detection and/or diagnosis of SARS-CoV-2 by FDA under an Emergency Use Authorization (EUA). This EUA will remain in effect (meaning this test can be used) for the duration of the COVID-19 declaration under Section 564(b)(1) of the Act, 21 U.S.C. section 360bbb-3(b)(1), unless the authorization is terminated or revoked.  Performed  at [facility], [address], [place], [place] [address]   Blood culture (routine x 2)     Status: None   Collection Time: 02/16/24  5:25 PM   Specimen: BLOOD RIGHT FOREARM  Result Value Ref Range Status   Specimen Description   Final    BLOOD RIGHT FOREARM Performed at [facility], [address], [place], [place] [address]    Special Requests   Final    BOTTLES DRAWN AEROBIC AND ANAEROBIC Blood Culture adequate volume Performed at [facility], [address] [place], [place] [address]    Culture   Final    NO GROWTH 5 DAYS Performed at [facility], [address] [place], [place] [address]    Report Status 02/22/2024 FINAL  Final  Culture, blood (Routine X 2) w Reflex to ID Panel     Status: None (Preliminary result)   Collection Time: 02/19/24  7:58 AM   Specimen: BLOOD RIGHT ARM  Result Value Ref Range Status   Specimen Description   Final    BLOOD RIGHT ARM Performed at [facility], [address] [place], [place] [address]    Special Requests   Final    BOTTLES DRAWN AEROBIC ONLY Blood Culture results may not be optimal due to an inadequate volume of blood received in culture bottles Performed at [facility], [address] [place], [place] [address]    Culture   Final    NO GROWTH 3 DAYS Performed at [facility], [address] [place], [place] [address]    Report Status PENDING  Incomplete   Culture, blood (Routine X 2) w Reflex to ID Panel     Status: None (Preliminary result)   Collection Time: 02/19/24  7:58 AM   Specimen: BLOOD RIGHT HAND  Result Value Ref Range Status   Specimen Description   Final    BLOOD RIGHT HAND Performed at [facility], [address], [place], [place] [address]    Special Requests   Final    BOTTLES DRAWN AEROBIC ONLY Blood Culture results may not be optimal due to an inadequate volume of blood received in culture bottles Performed at [facility], [address] [place], [place] [address]    Culture   Final    NO GROWTH 3 DAYS Performed at [facility], [address] [place], [place] [address]    Report Status PENDING  Incomplete  MRSA Next Gen by PCR, Nasal     Status: None   Collection Time: 02/20/24  7:24 PM   Specimen: Nasal Mucosa; Nasal Swab  Result Value Ref Range Status   MRSA by PCR Next Gen NOT DETECTED NOT DETECTED Final    Comment: (NOTE) The GeneXpert MRSA Assay (FDA approved for NASAL specimens only), is one component of a comprehensive MRSA colonization surveillance program. It is not intended to diagnose MRSA infection nor to guide or monitor treatment for MRSA infections. Test performance is not FDA approved in patients less than 46 years old. Performed at [facility], [address], [place], [place] [address]      Scheduled Meds:  acetaminophen   1,000 mg Oral Q6H   Chlorhexidine  Gluconate Cloth  6 each Topical Daily   feeding supplement  1 Container Oral TID BM   ipratropium-albuterol   3 mL Nebulization TID   [START ON 02/23/2024] metoprolol  succinate  50 mg Oral Daily  polyethylene glycol  17 g Oral Daily   senna-docusate  1 tablet Oral QHS   Continuous Infusions:  piperacillin -tazobactam (ZOSYN )  IV Stopped (02/22/24 1705)    Procedures/Studies: DG CHEST PORT 1 VIEW Result Date: 02/22/2024 CLINICAL DATA:  Shortness of breath. EXAM: PORTABLE CHEST  1 VIEW COMPARISON:  02/18/2024 FINDINGS: Normal heart size with stable mediastinal contours. Slight increase in atelectasis at the lung bases. No pulmonary edema, large pleural effusion or pneumothorax. Reverse left shoulder arthroplasty. IMPRESSION: Slight increase in atelectasis at the lung bases. Electronically Signed   By: Andrea Gasman M.D.   On: 02/22/2024 10:51   CT ABDOMEN PELVIS WO CONTRAST Result Date: 02/20/2024 EXAM: CT ABDOMEN AND PELVIS WITHOUT CONTRAST 02/20/2024 01:22:00 PM TECHNIQUE: CT of the abdomen and pelvis was performed without the administration of intravenous contrast. Multiplanar reformatted images are provided for review. Automated exposure control, iterative reconstruction, and/or weight-based adjustment of the mA/kV was utilized to reduce the radiation dose to as low as reasonably achievable. COMPARISON: HIDA scan dated 02/20/2024. CLINICAL HISTORY: Abdominal pain, post-op.Postoperative cholecystectomy day 3. FINDINGS: LOWER CHEST: Mild bibasilar atelectasis. LIVER: The liver is unremarkable. GALLBLADDER AND BILE DUCTS: Collection in the gallbladder fossa measures 6.1 x 4.8 cm. The collection is low density. Percutaneous drain extends through the fluid collection. Cholecystectomy clips in the region of the cystic duct. There is no biliary ductal dilatation. The common bile duct is normal caliber. SPLEEN: No acute abnormality. PANCREAS: No evidence of pancreatitis on noncontrast exam. ADRENAL GLANDS: No acute abnormality. KIDNEYS, URETERS AND BLADDER: No stones in the kidneys or ureters. No hydronephrosis. No perinephric or periureteral stranding. Urinary bladder is unremarkable. GI AND BOWEL: Stomach is normal. Duodenum appears normal. The bowel is fluid filled with air-fluid levels suggesting postoperative ileus. No pneumatosis. Stool in the rectum. PERITONEUM AND RETROPERITONEUM: Small amount of scattered gas in the upper peritoneal space related to recent laparoscopic surgery.  Small amount of high density fluid collects dependently within the posterior cul de sac with a fluid-fluid level. Small volume hemoperitoneum presumably related to recent surgery. VASCULATURE: Aorta is normal in caliber. LYMPH NODES: No lymphadenopathy. REPRODUCTIVE ORGANS: No acute abnormality. BONES AND SOFT TISSUES: No acute osseous abnormality. No focal soft tissue abnormality. IMPRESSION: 1. Collection in the gallbladder fossa measuring 6.1 x 4.8 cm with percutaneous drain in place. Findings consistent with predominantly contained bowel gas. 2. Postoperative ileus. 3. Small volume hemoperitoneum complex in the posterior cul de sac , presumably related to recent surgery. 4. These results will be called to the ordering clinician or representative by the radiologist assistant, and communication documented in the pacs or clario dashboard Electronically signed by: Norleen Boxer MD 02/20/2024 03:59 PM EST RP Workstation: [id number]   DG Abd Portable 1V Result Date: 02/20/2024 CLINICAL DATA:  Abdominal distension. EXAM: PORTABLE ABDOMEN - 1 VIEW COMPARISON:  02/18/2024 FINDINGS: Increasing gaseous distention of bowel loops in the central abdomen, primarily colonic distension, however prominent air-filled loops of small bowel are also seen. Drain in the right mid abdomen. Right upper quadrant surgical clips. IMPRESSION: Increasing gaseous distention of bowel loops in the central abdomen, primarily colonic distension, however prominent air-filled loops of small bowel are also seen. Favor generalized ileus. Electronically Signed   By: Andrea Gasman M.D.   On: 02/20/2024 14:05   NM HEPATOBILIARY LEAK (POST-SURGICAL) Result Date: 02/20/2024 EXAM: NM HEPATOBILLARY SCAN 02/20/2024 12:50:37 PM TECHNIQUE: RADIOPHARMACEUTICAL: 5.4 mCi Tc-51m mebrofenin  Dynamic images of the abdomen and pelvis were obtained in the anterior projection for 1 hour  after intravenous administration of radiopharmaceutical. The biliary drain  was clamped for the second hour of the exam and imaging continued for at least 30 minutes. COMPARISON: None available. CLINICAL HISTORY: Abdominal pain, post surgery or trauma, bile leak suspected. FINDINGS: Homogenous uptake within the liver. Normal clearance of the blood pool. Initial imaging demonstrates excreted radiotracer in the common hepatic duct with evidence of radiotracer within the biliary drain. The drain was clamped for the second hour of the exam. With the drain clamped, extra biliary radiotracer collects within the gallbladder fossa. Findings consistent with contained bowel leak within the gallbladder fossa. Radiotracer evacuating through the biliary drain when unclamped. No activity in the small bowel. IMPRESSION: 1. Radiotracer evacuates through the biliary drain when unclamped. 2. With drain clamped, extra biliary radiotracer collects within the gallbladder fossa. 3. Findings consistent with biliary leak . No activity in the small bowel suggests brisk leak. 4. These results will be called to the ordering clinician or representative by the radiologist assistant, and communication documented in the pacs or clario dashboard Electronically signed by: Norleen Boxer MD 02/20/2024 01:11 PM EST RP Workstation: [id number]   MR ABDOMEN MRCP WO CONTRAST Result Date: 02/19/2024 CLINICAL DATA:  Postop day 1 from laparoscopic cholecystectomy. Elevated LFTs. Clinical concern for retained common bile duct stone. EXAM: MRI ABDOMEN WITHOUT CONTRAST  (INCLUDING MRCP) TECHNIQUE: Multiplanar multisequence MR imaging of the abdomen was performed. Heavily T2-weighted images of the biliary and pancreatic ducts were obtained, and three-dimensional MRCP images were rendered by post processing. COMPARISON:  Abdomen and pelvis CT 02/16/2024 FINDINGS: Lower chest: Dependent atelectasis with tiny right pleural effusion. Hepatobiliary: No suspicious focal abnormality in the liver on this noncontrast study. There is some  periportal edema with edema in the inferior liver around the gallbladder fossa. Heterogeneous signal intensity in the gallbladder fossa likely reflects a combination of postoperative fluid/blood products in packing material. No substantial intrahepatic biliary duct dilatation. Common duct measures 7 mm diameter in the porta hepatis. Common bile duct just proximal to the ampulla is 7 mm diameter. There is no evidence for choledocholithiasis. Pancreas: No focal mass lesion. No dilatation of the main duct. No intraparenchymal cyst. No peripancreatic edema. Edema around the duodenum and pancreatic head is 2 compatible with recent surgery. Spleen:  No splenomegaly. No suspicious focal mass lesion. Adrenals/Urinary Tract: No adrenal nodule or mass. Bilateral renal cysts evident. Stomach/Bowel: Stomach is moderately distended with fluid. Small bowel and colon shows mild diffuse gaseous distension. Vascular/Lymphatic: No abdominal aortic aneurysm small retroperitoneal lymph nodes are similar to recent CT scan. Other: Edema in the anterior abdominal wall is compatible with the recent surgical access. There is dependent subcutaneous body wall edema bilaterally bilaterally. Musculoskeletal: No overtly suspicious marrow signal abnormality. IMPRESSION: 1. Postop day 1 from cholecystectomy. Heterogeneous signal intensity in the gallbladder fossa likely reflects a combination of postoperative fluid/blood products and packing material. 2. No substantial intrahepatic biliary duct dilatation. Common duct measures 7 mm diameter in the porta hepatis and just proximal to the ampulla. No evidence for choledocholithiasis. 3. Mild diffuse gaseous distension of the small bowel and colon compatible with mild postoperative ileus. 4. Dependent atelectasis with tiny right pleural effusion. Electronically Signed   By: Camellia Candle M.D.   On: 02/19/2024 05:32   DG Abd Portable 1V Result Date: 02/18/2024 CLINICAL DATA:  Distension postop  cholecystostomy EXAM: PORTABLE ABDOMEN - 1 VIEW COMPARISON:  CT 02/16/2024 FINDINGS: Postop changes in the right upper quadrant with clips and probable drainage catheter. Mild increased  small and large bowel gas without obstruction. Moderate stool IMPRESSION: Mild increased small and large bowel gas without obstruction, possible mild ileus. Moderate stool. Electronically Signed   By: Luke Bun M.D.   On: 02/18/2024 15:59   DG Chest Port 1 View Result Date: 02/18/2024 CLINICAL DATA:  Shortness of breath EXAM: PORTABLE CHEST 1 VIEW COMPARISON:  02/16/2024 FINDINGS: Hypoventilatory change. Minimal atelectasis right base. No consolidation, pleural effusion or pneumothorax. Normal cardiac size. Aortic atherosclerosis. Left shoulder replacement IMPRESSION: Hypoventilatory changes with minimal atelectasis at the right base. Electronically Signed   By: Luke Bun M.D.   On: 02/18/2024 15:58   CT ABDOMEN PELVIS W CONTRAST Result Date: 02/16/2024 EXAM: CT ABDOMEN AND PELVIS WITH CONTRAST 02/16/2024 07:57:25 PM TECHNIQUE: CT of the abdomen and pelvis was performed with the administration of 100 mL of iohexol  (OMNIPAQUE ) 350 MG/ML injection. Multiplanar reformatted images are provided for review. Automated exposure control, iterative reconstruction, and/or weight-based adjustment of the mA/kV was utilized to reduce the radiation dose to as low as reasonably achievable. COMPARISON: None available. CLINICAL HISTORY: Epigastric pain; cp, epigastric abd pain, leukocytosis. FINDINGS: LOWER CHEST: No acute abnormality. LIVER: Hepatic steatosis. GALLBLADDER AND BILE DUCTS: Distended gallbladder with wall thickening and pericholecystic fluid compatible with acute cholecystitis. No radiopaque stone or biliary ductal dilatation. SPLEEN: No acute abnormality. PANCREAS: No acute abnormality. ADRENAL GLANDS: No acute abnormality. KIDNEYS, URETERS AND BLADDER: No stones in the kidneys or ureters. No hydronephrosis. No  perinephric or periureteral stranding. Urinary bladder is unremarkable. GI AND BOWEL: Stomach demonstrates no acute abnormality. Colonic diverticulosis without evidence of diverticulitis. There is no bowel obstruction. PERITONEUM AND RETROPERITONEUM: No ascites. No free air. VASCULATURE: Aorta is normal in caliber. Aortic atherosclerotic calcification. LYMPH NODES: No lymphadenopathy. REPRODUCTIVE ORGANS: No acute abnormality. BONES AND SOFT TISSUES: No acute osseous abnormality. No focal soft tissue abnormality. IMPRESSION: 1. Acute cholecystitis. Electronically signed by: Norman Gatlin MD 02/16/2024 08:10 PM EST RP Workstation: [id number]   CT Angio Chest PE W and/or Wo Contrast Result Date: 02/16/2024 EXAM: CTA CHEST 02/16/2024 07:57:25 PM TECHNIQUE: CTA of the chest was performed with the administration of 100 mL of iohexol  (OMNIPAQUE ) 350 MG/ML injection. Multiplanar reformatted images are provided for review. MIP images are provided for review. Automated exposure control, iterative reconstruction, and/or weight based adjustment of the mA/kV was utilized to reduce the radiation dose to as low as reasonably achievable. COMPARISON: Comparison with the same day x-ray. CLINICAL HISTORY: Pulmonary embolism (PE) suspected, high prob; cough, cp, abd pain. FINDINGS: PULMONARY ARTERIES: Pulmonary arteries are adequately opacified for evaluation. No acute pulmonary embolus. Main pulmonary artery is normal in caliber. MEDIASTINUM: Coronary artery and aortic atherosclerotic calcification. The heart and pericardium demonstrate no acute abnormality. There is no acute abnormality of the thoracic aorta. LYMPH NODES: No mediastinal, hilar or axillary lymphadenopathy. LUNGS AND PLEURA: Emphysema. Right lower lobe atelectasis. No evidence of pleural effusion or pneumothorax. UPPER ABDOMEN: Hepatic steatosis. SOFT TISSUES AND BONES: No acute bone or soft tissue abnormality. IMPRESSION: 1. No pulmonary embolism. 2. Emphysema.  Pulmonary emphysema is an independent risk factor for lung cancer. Recommend consideration for evaluation for a low-dose CT lung cancer screening program. Electronically signed by: Norman Gatlin MD 02/16/2024 08:07 PM EST RP Workstation: [id number]   DG Chest Portable 1 View Result Date: 02/16/2024 CLINICAL DATA:  Cough. EXAM: PORTABLE CHEST 1 VIEW COMPARISON:  05/11/2021. FINDINGS: The heart size and mediastinal contours are within normal limits. There is elevation of the right diaphragm. Atelectasis is noted at the  lung bases bilaterally. No effusion or pneumothorax is seen. 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 IMPRESSION: Elevation of the right diaphragm with atelectasis at the lung bases. Electronically Signed   By: Leita Birmingham M.D.   On: 02/16/2024 17:48    Lebron JINNY Cage, MD  [facility]  If 7PM-7AM, please contact night-coverage [email or web address] 02/22/2024, 5:16 PM   LOS: 6 days   "

## 2024-02-22 NOTE — Progress Notes (Deleted)
 Patient's wife had questions about why tube feed and fluids were stopped. I explained to the patient's wife, son and other family and friends at the bedside about comfort measures and I would contact the [facility] provider for clarification of orders.

## 2024-02-22 NOTE — Progress Notes (Signed)
 1 Day Post-Op ERCP, POD 5 lap chole Subjective: He is feeling better.  Tolerating clears and having flatus  Objective: Vital signs in last 24 hours: Temp:  [98.1 F (36.7 C)-99.1 F (37.3 C)] 98.2 F (36.8 C) (12/28 0400) Pulse Rate:  [58-101] 65 (12/28 0600) Resp:  [15-29] 20 (12/28 0600) BP: (128-207)/(53-87) 148/58 (12/28 0600) SpO2:  [93 %-98 %] 97 % (12/28 0600)   Intake/Output from previous day: 12/27 0701 - 12/28 0700 In: 1815.5 [P.O.:120; I.V.:1542.5; IV Piggyback:153] Out: 1045 [Urine:750; Drains:295] Intake/Output this shift: No intake/output data recorded.   General appearance: alert and cooperative GI: soft, non-distended JP: bilious drainage  Incision: no significant drainage  Lab Results:  Recent Labs    02/21/24 0235 02/22/24 0300  WBC 23.1* 17.1*  HGB 10.6* 9.3*  HCT 32.5* 29.4*  PLT 376 373   BMET Recent Labs    02/21/24 0235 02/22/24 0300  NA 141 139  K 3.5 3.6  CL 104 102  CO2 23 23  GLUCOSE 120* 132*  BUN 22 19  CREATININE 0.59* 0.72  CALCIUM 9.1 8.8*   PT/INR No results for input(s): LABPROT, INR in the last 72 hours. ABG No results for input(s): PHART, HCO3 in the last 72 hours.  Invalid input(s): PCO2, PO2  MEDS, Scheduled  acetaminophen   1,000 mg Oral Q6H   Chlorhexidine  Gluconate Cloth  6 each Topical Daily   feeding supplement  1 Container Oral TID BM   ipratropium-albuterol   3 mL Nebulization TID   metoprolol  succinate  100 mg Oral Daily   polyethylene glycol  17 g Oral Daily   senna-docusate  1 tablet Oral QHS    Studies/Results: CT ABDOMEN PELVIS WO CONTRAST Result Date: 02/20/2024 EXAM: CT ABDOMEN AND PELVIS WITHOUT CONTRAST 02/20/2024 01:22:00 PM TECHNIQUE: CT of the abdomen and pelvis was performed without the administration of intravenous contrast. Multiplanar reformatted images are provided for review. Automated exposure control, iterative reconstruction, and/or weight-based adjustment of the mA/kV  was utilized to reduce the radiation dose to as low as reasonably achievable. COMPARISON: HIDA scan dated 02/20/2024. CLINICAL HISTORY: Abdominal pain, post-op.Postoperative cholecystectomy day 3. FINDINGS: LOWER CHEST: Mild bibasilar atelectasis. LIVER: The liver is unremarkable. GALLBLADDER AND BILE DUCTS: Collection in the gallbladder fossa measures 6.1 x 4.8 cm. The collection is low density. Percutaneous drain extends through the fluid collection. Cholecystectomy clips in the region of the cystic duct. There is no biliary ductal dilatation. The common bile duct is normal caliber. SPLEEN: No acute abnormality. PANCREAS: No evidence of pancreatitis on noncontrast exam. ADRENAL GLANDS: No acute abnormality. KIDNEYS, URETERS AND BLADDER: No stones in the kidneys or ureters. No hydronephrosis. No perinephric or periureteral stranding. Urinary bladder is unremarkable. GI AND BOWEL: Stomach is normal. Duodenum appears normal. The bowel is fluid filled with air-fluid levels suggesting postoperative ileus. No pneumatosis. Stool in the rectum. PERITONEUM AND RETROPERITONEUM: Small amount of scattered gas in the upper peritoneal space related to recent laparoscopic surgery. Small amount of high density fluid collects dependently within the posterior cul de sac with a fluid-fluid level. Small volume hemoperitoneum presumably related to recent surgery. VASCULATURE: Aorta is normal in caliber. LYMPH NODES: No lymphadenopathy. REPRODUCTIVE ORGANS: No acute abnormality. BONES AND SOFT TISSUES: No acute osseous abnormality. No focal soft tissue abnormality. IMPRESSION: 1. Collection in the gallbladder fossa measuring 6.1 x 4.8 cm with percutaneous drain in place. Findings consistent with predominantly contained bowel gas. 2. Postoperative ileus. 3. Small volume hemoperitoneum complex in the posterior cul de sac ,  presumably related to recent surgery. 4. These results will be called to the ordering clinician or representative by  the radiologist assistant, and communication documented in the pacs or clario dashboard Electronically signed by: Norleen Boxer MD 02/20/2024 03:59 PM EST RP Workstation: [id number]   DG Abd Portable 1V Result Date: 02/20/2024 CLINICAL DATA:  Abdominal distension. EXAM: PORTABLE ABDOMEN - 1 VIEW COMPARISON:  02/18/2024 FINDINGS: Increasing gaseous distention of bowel loops in the central abdomen, primarily colonic distension, however prominent air-filled loops of small bowel are also seen. Drain in the right mid abdomen. Right upper quadrant surgical clips. IMPRESSION: Increasing gaseous distention of bowel loops in the central abdomen, primarily colonic distension, however prominent air-filled loops of small bowel are also seen. Favor generalized ileus. Electronically Signed   By: Andrea Gasman M.D.   On: 02/20/2024 14:05   NM HEPATOBILIARY LEAK (POST-SURGICAL) Result Date: 02/20/2024 EXAM: NM HEPATOBILLARY SCAN 02/20/2024 12:50:37 PM TECHNIQUE: RADIOPHARMACEUTICAL: 5.4 mCi Tc-7m mebrofenin  Dynamic images of the abdomen and pelvis were obtained in the anterior projection for 1 hour after intravenous administration of radiopharmaceutical. The biliary drain was clamped for the second hour of the exam and imaging continued for at least 30 minutes. COMPARISON: None available. CLINICAL HISTORY: Abdominal pain, post surgery or trauma, bile leak suspected. FINDINGS: Homogenous uptake within the liver. Normal clearance of the blood pool. Initial imaging demonstrates excreted radiotracer in the common hepatic duct with evidence of radiotracer within the biliary drain. The drain was clamped for the second hour of the exam. With the drain clamped, extra biliary radiotracer collects within the gallbladder fossa. Findings consistent with contained bowel leak within the gallbladder fossa. Radiotracer evacuating through the biliary drain when unclamped. No activity in the small bowel. IMPRESSION: 1. Radiotracer  evacuates through the biliary drain when unclamped. 2. With drain clamped, extra biliary radiotracer collects within the gallbladder fossa. 3. Findings consistent with biliary leak . No activity in the small bowel suggests brisk leak. 4. These results will be called to the ordering clinician or representative by the radiologist assistant, and communication documented in the pacs or clario dashboard Electronically signed by: Norleen Boxer MD 02/20/2024 01:11 PM EST RP Workstation: [id number]    Assessment: s/p Procedures: ERCP, WITH INTERVENTION IF INDICATED ERCP, WITH LITHROTRIPSY OR REMOVAL OF COMMON BILE DUCT CALCULUS USING BASKET INSERTION, STENT, BILE DUCT Patient Active Problem List   Diagnosis Date Noted   Bile leak, postoperative 02/21/2024   Choledocholithiasis 02/21/2024   Cholangitis (HCC) 02/21/2024   Hypokalemia 02/17/2024   Hyponatremia 02/17/2024   Dehydration 02/17/2024   Acute cholecystitis 02/16/2024   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, 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   Septic arthritis of shoulder, left (HCC) 09/16/2023   MSSA (methicillin susceptible Staphylococcus aureus) infection 04/29/2023   Infection of prosthetic total shoulder joint, initial encounter 04/03/2023   History of revision of total shoulder arthroplasty 03/13/2023   Metabolic dysfunction-associated steatohepatitis (MASH) 12/24/2022   Arthropathy of cervical facet joint 09/11/2022   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   DDD (degenerative disc disease), cervical 09/11/2022   Viral URI 08/28/2022   Neck pain on left side 03/12/2022   Osteoarthritis of right knee 05/18/2021   Closed fracture of one rib 04/08/2017   Contusion of chest 04/06/2017   History of colonic polyps 04/04/2016   Cataract of both eyes 09/28/2015   Retinopathy, hypertensive, both eyes 09/28/2015   Glaucoma of both eyes 09/28/2015   Primary osteoarthritis of left knee 11/27/2014    INSOMNIA,  CHRONIC 11/13/2009   Sleep apnea 09/20/2009   Low testosterone  08/14/2009   Osteoarthritis 06/07/2008   GEN OSTEOARTHROSIS INVOLVING MULTIPLE SITES 11/10/2007   Elevated LFTs 08/27/2007   GERD 01/01/2007   Essential hypertension 01/01/2007    Post operative bile leak due to necrotic cystic duct stump.  S/p ERCP and stent placement on 12/27 for high output drainage  Plan: Advance diet to low fat Cont IV abx Monitor JP output  LOS: 6 days     .Nathan Stewart Ned, MD [facility], [place]    02/22/2024 7:38 AM

## 2024-02-22 NOTE — H&P (View-Only) (Signed)
"      ° °  Patient Name: Nathan Stewart Date of Encounter: 02/22/2024, 10:36 AM     Assessment and Plan  #1 postcholecystectomy bile leak-improved after sphincterotomy, bile duct stone removal and stenting  #2 retained CBD stone, cholangitis-improved  #3 ileus-improved  --------------------------------------------------------------------------------------------------------------------------------------  Drain output much less ileus resolving he feels and looks much better. I am signing off I will arrange outpatient ERCP and stent removal, probably in about 2 months barring problems. We will contact him after discharge to arrange this.  Lupita CHARLENA Commander, MD, [facility] See TRACEY on call - gastroenterology for best contact person 02/22/2024 10:40 AM    Subjective  Feels better, passing gas, no abdominal pain since yesterday.   Objective  BP (!) 145/64   Pulse (!) 58   Temp 97.8 F (36.6 C) (Axillary)   Resp (!) 23   Ht 6' 2 (1.88 m)   Wt 103 kg   SpO2 96%   BMI 29.15 kg/m  Abdomen-right upper quadrant drain small amount of bilious output, obese but nontender  Drain output 12/26 1065 cc 12/27 295 cc   Recent Labs  Lab 02/18/24 0454 02/19/24 0406 02/20/24 0340 02/21/24 0235 02/22/24 0300  AST 302* 118* 47* 33 33  ALT 414* 333* 202* 135* 95*  ALKPHOS 467* 369* 311* 384* 185*  BILITOT 5.0* 3.8* 1.8* 1.6* 1.2  PROT 6.9 7.1 6.9 6.9 6.4*  ALBUMIN  3.6 3.5 3.2* 3.4* 3.2*   Recent Labs  Lab 02/20/24 0340 02/21/24 0235 02/22/24 0300  HGB 10.6* 10.6* 9.3*  HCT 31.8* 32.5* 29.4*  WBC 24.8* 23.1* 17.1*  PLT 354 376 373     Lupita CHARLENA Commander, MD, [facility] See AMION on call - gastroenterology for best contact person 02/22/2024 10:36 AM   "

## 2024-02-22 NOTE — Progress Notes (Signed)
"      ° °  Patient Name: Nathan Stewart Date of Encounter: 02/22/2024, 10:36 AM     Assessment and Plan  #1 postcholecystectomy bile leak-improved after sphincterotomy, bile duct stone removal and stenting  #2 retained CBD stone, cholangitis-improved  #3 ileus-improved  --------------------------------------------------------------------------------------------------------------------------------------  Drain output much less ileus resolving he feels and looks much better. I am signing off I will arrange outpatient ERCP and stent removal, probably in about 2 months barring problems. We will contact him after discharge to arrange this.  Lupita CHARLENA Commander, MD, [facility] [place] [facility] See TRACEY on call - gastroenterology for best contact person 02/22/2024 10:40 AM    Subjective  Feels better, passing gas, no abdominal pain since yesterday.   Objective  BP (!) 145/64   Pulse (!) 58   Temp 97.8 F (36.6 C) (Axillary)   Resp (!) 23   Ht 6' 2 (1.88 m)   Wt 103 kg   SpO2 96%   BMI 29.15 kg/m  Abdomen-right upper quadrant drain small amount of bilious output, obese but nontender  Drain output 12/26 1065 cc 12/27 295 cc   Recent Labs  Lab 02/18/24 0454 02/19/24 0406 02/20/24 0340 02/21/24 0235 02/22/24 0300  AST 302* 118* 47* 33 33  ALT 414* 333* 202* 135* 95*  ALKPHOS 467* 369* 311* 384* 185*  BILITOT 5.0* 3.8* 1.8* 1.6* 1.2  PROT 6.9 7.1 6.9 6.9 6.4*  ALBUMIN  3.6 3.5 3.2* 3.4* 3.2*   Recent Labs  Lab 02/20/24 0340 02/21/24 0235 02/22/24 0300  HGB 10.6* 10.6* 9.3*  HCT 31.8* 32.5* 29.4*  WBC 24.8* 23.1* 17.1*  PLT 354 376 373     Lupita CHARLENA Commander, MD, [facility] See AMION on call - gastroenterology for best contact person 02/22/2024 10:36 AM   "

## 2024-02-23 DIAGNOSIS — K838 Other specified diseases of biliary tract: Secondary | ICD-10-CM

## 2024-02-23 DIAGNOSIS — K9189 Other postprocedural complications and disorders of digestive system: Secondary | ICD-10-CM | POA: Diagnosis not present

## 2024-02-23 DIAGNOSIS — K8309 Other cholangitis: Secondary | ICD-10-CM

## 2024-02-23 DIAGNOSIS — K8050 Calculus of bile duct without cholangitis or cholecystitis without obstruction: Secondary | ICD-10-CM

## 2024-02-23 LAB — CBC
HCT: 30 % — ABNORMAL LOW (ref 39.0–52.0)
Hemoglobin: 9.5 g/dL — ABNORMAL LOW (ref 13.0–17.0)
MCH: 29.3 pg (ref 26.0–34.0)
MCHC: 31.7 g/dL (ref 30.0–36.0)
MCV: 92.6 fL (ref 80.0–100.0)
Platelets: 402 K/uL — ABNORMAL HIGH (ref 150–400)
RBC: 3.24 MIL/uL — ABNORMAL LOW (ref 4.22–5.81)
RDW: 13.2 % (ref 11.5–15.5)
WBC: 14.4 K/uL — ABNORMAL HIGH (ref 4.0–10.5)
nRBC: 0 % (ref 0.0–0.2)

## 2024-02-23 MED ORDER — PIPERACILLIN-TAZOBACTAM 3.375 G IVPB
3.3750 g | Freq: Three times a day (TID) | INTRAVENOUS | Status: DC
  Administered 2024-02-23 – 2024-02-27 (×11): 3.375 g via INTRAVENOUS
  Filled 2024-02-23 (×11): qty 50

## 2024-02-23 MED ORDER — ENOXAPARIN SODIUM 40 MG/0.4ML IJ SOSY
40.0000 mg | PREFILLED_SYRINGE | INTRAMUSCULAR | Status: DC
  Administered 2024-02-23 – 2024-02-27 (×4): 40 mg via SUBCUTANEOUS
  Filled 2024-02-23 (×4): qty 0.4

## 2024-02-23 MED ORDER — HYDROCHLOROTHIAZIDE 25 MG PO TABS
25.0000 mg | ORAL_TABLET | Freq: Every day | ORAL | Status: DC
  Administered 2024-02-23 – 2024-02-27 (×5): 25 mg via ORAL
  Filled 2024-02-23 (×5): qty 1

## 2024-02-23 MED ORDER — AMLODIPINE BESYLATE 5 MG PO TABS
5.0000 mg | ORAL_TABLET | Freq: Every day | ORAL | Status: DC
  Administered 2024-02-23 – 2024-02-27 (×5): 5 mg via ORAL
  Filled 2024-02-23 (×5): qty 1

## 2024-02-23 MED ORDER — IPRATROPIUM-ALBUTEROL 0.5-2.5 (3) MG/3ML IN SOLN
3.0000 mL | Freq: Two times a day (BID) | RESPIRATORY_TRACT | Status: DC
  Administered 2024-02-23 – 2024-02-27 (×7): 3 mL via RESPIRATORY_TRACT
  Filled 2024-02-23 (×7): qty 3

## 2024-02-23 NOTE — Plan of Care (Signed)
  Problem: Clinical Measurements: Goal: Diagnostic test results will improve Outcome: Progressing   Problem: Activity: Goal: Risk for activity intolerance will decrease Outcome: Progressing   Problem: Nutrition: Goal: Adequate nutrition will be maintained Outcome: Progressing   Problem: Pain Managment: Goal: General experience of comfort will improve and/or be controlled Outcome: Progressing

## 2024-02-23 NOTE — Progress Notes (Signed)
 "          PROGRESS NOTE  Nathan Stewart FMW:[id number] DOB: 1955/03/09 DOA: 02/16/2024 PCP: [name], MD  Brief History:  68 y.o. 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 He states the pain resolved, but for the last week he has been having nausea and unable to eat. He has been drinking though and able to keep it down. Pt was found to have acute cholecystitis (no stones seen on CT) on a CT scan. His WBC 18.0 in ED. General surgery consulted and pt taken to surgery for lap chole on 02/17/24.     Today, pt had small BM, has been passing gas. Tolerating diet.   Assessment/Plan: Acute cholecystitis s/p lap chole on 12/23, noted necrotic gallbladder and cystic duct (encountered a bleeding vessel controlled with a clip during surgery) Noted biliary leak s/p lap chole Elevated LFTs s/p lap chole ?Post op ileus Sepsis Currently afebrile, resolving leukocytosis Improved LFTs Procalcitonin 2.76--->1.22 Abdominal x-ray on 12/24 with mild increase small and large bowel gas without obstruction, possibly mild ileus, noted moderate stool, repeat with generalized ileus Chest x-ray with mild atelectasis HIDA scan findings consistent with biliary leak GI consulted, s/p ERCP on 02/21/2024 showed bile leak in the region of the cystic duct stump, 5 mm choledocholithiasis was found, with complete removal with biliary sphincterotomy and balloon extraction, 1 plastic stent was placed into the bile duct.  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 intake and dehydration     Consultants:  general surgery, GI  Code Status:  FULL   DVT Prophylaxis:  SCD   Procedures: As Listed in Progress Note Above  Antibiotics: Zosyn  12/23>>   Objective: Vitals:   02/23/24 1000 02/23/24 1142 02/23/24 1215 02/23/24 1731  BP: (!) 172/71  (!) 164/86 (!) 164/87  Pulse: 79 72 75 75  Resp: (!) 25 (!) 23 17 17   Temp:   97.9 F (36.6 C) 98.7 F (37.1 C)  TempSrc:   Oral Oral  SpO2: 96% 95% 96% 96%  Weight:      Height:        Intake/Output Summary (Last 24 hours) at 02/23/2024 1841 Last data filed at 02/23/2024 1145 Gross per 24 hour  Intake 346.68 ml  Output 1100 ml  Net -753.32 ml   Weight change:  Exam: General: NAD  Cardiovascular: S1, S2 present Respiratory: CTAB Abdomen: Soft, nontender, distended, no bowel sounds Musculoskeletal: No bilateral pedal edema noted Skin: Normal Psychiatry: Normal mood     Data Reviewed: I have personally reviewed following labs and imaging studies Basic Metabolic Panel: Recent Labs  Lab 02/17/24 0042 02/18/24 0454 02/19/24 0406 02/20/24 0340 02/21/24 0235 02/22/24 0300  NA 131* 132*  134* 137 141 139  K 3.3* 3.7 3.8 3.5 3.5 3.6  CL 91* 95* 98 100 104 102  CO2 26 25 23 24 23 23   GLUCOSE 108* 147* 131* 102* 120* 132*  BUN 12 11 17 23 22 19   CREATININE 0.81 0.75 0.87 0.73 0.59* 0.72  CALCIUM 10.1 9.2 9.1 9.2 9.1 8.8*  MG 2.2  --   --   --   --   --   PHOS 3.5  --   --   --   --   --    Liver Function Tests: Recent Labs  Lab 02/18/24 0454 02/19/24 0406 02/20/24 0340  02/21/24 0235 02/22/24 0300  AST 302* 118* 47* 33 33  ALT 414* 333* 202* 135* 95*  ALKPHOS 467* 369* 311* 384* 185*  BILITOT 5.0* 3.8* 1.8* 1.6* 1.2  PROT 6.9 7.1 6.9 6.9 6.4*  ALBUMIN  3.6 3.5 3.2* 3.4* 3.2*   No results for input(s): LIPASE, AMYLASE in the last 168 hours.  No results for input(s): AMMONIA in the last 168 hours. Coagulation Profile: No results for input(s): INR, PROTIME in the last 168 hours. CBC: Recent Labs  Lab 02/18/24 0454 02/18/24 1553 02/19/24 0406 02/20/24 0340 02/21/24 0235 02/22/24 0300 02/23/24 0250  WBC 17.5*   < > 20.8* 24.8* 23.1* 17.1* 14.4*  NEUTROABS 15.1*  --   --   --   --   --   --   HGB 12.0*   < > 11.6* 10.6* 10.6* 9.3* 9.5*  HCT 34.7*   < > 34.7* 31.8* 32.5* 29.4* 30.0*  MCV 86.8   < > 88.3 89.6 91.3 91.9 92.6  PLT 261   < > 334 354 376 373 402*   < > = values in this interval not displayed.   Cardiac Enzymes: Recent Labs  Lab 02/17/24 0043  CKTOTAL 228   BNP: Invalid input(s): POCBNP CBG: Recent Labs  Lab 02/18/24 1521  GLUCAP 182*   HbA1C: No results for input(s): HGBA1C in the last 72 hours. Urine analysis:    Component Value Date/Time   COLORURINE YELLOW 02/16/2024 1840   APPEARANCEUR CLEAR 02/16/2024 1840   LABSPEC 1.007 02/16/2024 1840   PHURINE 7.0 02/16/2024 1840   GLUCOSEU NEGATIVE 02/16/2024 1840   HGBUR SMALL (A) 02/16/2024 1840   HGBUR trace-lysed 03/20/2010 0814   BILIRUBINUR NEGATIVE 02/16/2024 1840   BILIRUBINUR small (A) 02/13/2024 1641   BILIRUBINUR n 06/07/2013 1107   KETONESUR NEGATIVE 02/16/2024 1840   PROTEINUR NEGATIVE 02/16/2024 1840   UROBILINOGEN 1.0 02/13/2024 1641   UROBILINOGEN 0.2 11/16/2014 0907   NITRITE NEGATIVE 02/16/2024 1840   LEUKOCYTESUR NEGATIVE 02/16/2024 1840   Sepsis Labs: @LABRCNTIP (procalcitonin:4,lacticidven:4) ) Recent Results (from the past 240 hours)  Blood culture (routine x 2)     Status: None   Collection Time: 02/16/24  5:20 PM   Specimen:  BLOOD RIGHT HAND  Result Value Ref Range Status   Specimen Description   Final    BLOOD RIGHT HAND Performed at [facility], [address], [place], [place] [address]    Special Requests   Final    BOTTLES DRAWN AEROBIC AND ANAEROBIC Blood Culture adequate volume Performed at [facility], [address], [place], [place] [address]    Culture   Final    NO GROWTH 5 DAYS Performed at [facility], [address], [place], [place] [address]    Report Status 02/22/2024 FINAL  Final  Resp panel by RT-PCR (RSV,  Flu A&B, Covid) Anterior Nasal Swab     Status: None   Collection Time: 02/16/24  5:20 PM   Specimen: Anterior Nasal Swab  Result Value Ref Range Status   SARS Coronavirus 2 by RT PCR NEGATIVE NEGATIVE Final    Comment: (NOTE) SARS-CoV-2 target nucleic acids are NOT DETECTED.  The SARS-CoV-2 RNA is generally detectable in upper respiratory specimens during the acute phase of infection. The lowest concentration of SARS-CoV-2 viral copies this assay can detect is 138 copies/mL. A negative result does not preclude SARS-Cov-2 infection and should not be used as the sole basis for treatment or other patient management decisions. A negative result may occur with  improper specimen collection/handling, submission of specimen other than nasopharyngeal swab, presence of viral mutation(s) within the areas targeted by this assay, and inadequate number of viral copies(<138 copies/mL). A negative result must be combined with clinical observations, patient history, and epidemiological information. The expected result is Negative.  Fact Sheet for Patients:  bloggercourse.com  Fact Sheet for Healthcare Providers:  seriousbroker.it  This test is no t yet approved or cleared by the [place]  FDA and  has been authorized for detection and/or diagnosis of SARS-CoV-2 by FDA under an Emergency Use  Authorization (EUA). This EUA will remain  in effect (meaning this test can be used) for the duration of the COVID-19 declaration under Section 564(b)(1) of the Act, 21 U.S.C.section 360bbb-3(b)(1), unless the authorization is terminated  or revoked sooner.       Influenza A by PCR NEGATIVE NEGATIVE Final   Influenza B by PCR NEGATIVE NEGATIVE Final    Comment: (NOTE) The Xpert Xpress SARS-CoV-2/FLU/RSV plus assay is intended as an aid in the diagnosis of influenza from Nasopharyngeal swab specimens and should not be used as a sole basis for treatment. Nasal washings and aspirates are unacceptable for Xpert Xpress SARS-CoV-2/FLU/RSV testing.  Fact Sheet for Patients: bloggercourse.com  Fact Sheet for Healthcare Providers: seriousbroker.it  This test is not yet approved or cleared by the [place]  FDA and has been authorized for detection and/or diagnosis of SARS-CoV-2 by FDA under an Emergency Use Authorization (EUA). This EUA will remain in effect (meaning this test can be used) for the duration of the COVID-19 declaration under Section 564(b)(1) of the Act, 21 U.S.C. section 360bbb-3(b)(1), unless the authorization is terminated or revoked.     Resp Syncytial Virus by PCR NEGATIVE NEGATIVE Final    Comment: (NOTE) Fact Sheet for Patients: bloggercourse.com  Fact Sheet for Healthcare Providers: seriousbroker.it  This test is not yet approved or cleared by the [place]  FDA and has been authorized for detection and/or diagnosis of SARS-CoV-2 by FDA under an Emergency Use Authorization (EUA). This EUA will remain in effect (meaning this test can be used) for the duration of the COVID-19 declaration under Section 564(b)(1) of the Act, 21 U.S.C. section 360bbb-3(b)(1), unless the authorization is terminated or revoked.  Performed at [facility], [address], [place], [place] [address]   Blood culture (routine x 2)     Status: None   Collection Time: 02/16/24  5:25 PM   Specimen: BLOOD RIGHT FOREARM  Result Value Ref Range Status   Specimen Description   Final    BLOOD RIGHT FOREARM Performed at [facility], [address] [place], [place] [address]    Special Requests   Final    BOTTLES DRAWN AEROBIC AND ANAEROBIC Blood Culture adequate volume Performed at [facility], [address]  [place], [place], [place] [address]    Culture   Final    NO GROWTH 5 DAYS Performed at [facility], [address], [place], [place] [address]    Report Status 02/22/2024 FINAL  Final  Culture, blood (Routine X 2) w Reflex to ID Panel     Status: None (Preliminary result)   Collection Time: 02/19/24  7:58 AM   Specimen: BLOOD RIGHT ARM  Result Value Ref Range Status   Specimen Description   Final    BLOOD RIGHT ARM Performed at [facility], [address] [place], [place] [address]    Special Requests   Final    BOTTLES DRAWN AEROBIC ONLY Blood Culture results may not be optimal due to an inadequate volume of blood received in culture bottles Performed at [facility], [address] [place], [place] [address]    Culture   Final    NO GROWTH 4 DAYS Performed at [facility], [address] [place], [place] [address]    Report Status PENDING  Incomplete  Culture, blood (Routine X 2) w Reflex to ID Panel     Status: None (Preliminary result)   Collection Time: 02/19/24  7:58 AM   Specimen: BLOOD RIGHT HAND  Result Value Ref Range Status   Specimen Description   Final    BLOOD RIGHT HAND Performed at [facility], [address] [place], [place] [address]    Special Requests   Final    BOTTLES DRAWN AEROBIC ONLY Blood Culture results may not be optimal due to an inadequate volume of blood received in culture bottles Performed at [facility],  [address] [place], [place] [address]    Culture   Final    NO GROWTH 4 DAYS Performed at [facility], [address] [place], [place] [address]    Report Status PENDING  Incomplete  MRSA Next Gen by PCR, Nasal     Status: None   Collection Time: 02/20/24  7:24 PM   Specimen: Nasal Mucosa; Nasal Swab  Result Value Ref Range Status   MRSA by PCR Next Gen NOT DETECTED NOT DETECTED Final    Comment: (NOTE) The GeneXpert MRSA Assay (FDA approved for NASAL specimens only), is one component of a comprehensive MRSA colonization surveillance program. It is not intended to diagnose MRSA infection nor to guide or monitor treatment for MRSA infections. Test performance is not FDA approved in patients less than 40 years old. Performed at [facility], [address], [place], [place] [address]      Scheduled Meds:  acetaminophen   1,000 mg Oral Q6H   amLODipine   5 mg Oral Daily   Chlorhexidine  Gluconate Cloth  6 each Topical Daily   enoxaparin  (LOVENOX ) injection  40 mg Subcutaneous Q24H   feeding supplement  1 Container Oral TID BM   hydrochlorothiazide   25 mg Oral Daily   ipratropium-albuterol   3 mL Nebulization BID   metoprolol  succinate  50 mg Oral Daily   polyethylene glycol  17 g Oral Daily   senna-docusate  1 tablet Oral QHS   Continuous Infusions:    Procedures/Studies: DG CHEST PORT 1 VIEW Result Date: 02/22/2024 CLINICAL DATA:  Shortness of breath. EXAM: PORTABLE CHEST 1 VIEW COMPARISON:  02/18/2024 FINDINGS: Normal heart size with stable mediastinal contours. Slight increase in atelectasis at the lung bases. No pulmonary edema, large pleural effusion or pneumothorax. Reverse left shoulder arthroplasty. IMPRESSION:  Slight increase in atelectasis at the lung bases. Electronically Signed   By: Andrea Gasman M.D.   On: 02/22/2024 10:51   CT ABDOMEN PELVIS WO CONTRAST Result Date: 02/20/2024 EXAM: CT ABDOMEN AND PELVIS WITHOUT CONTRAST  02/20/2024 01:22:00 PM TECHNIQUE: CT of the abdomen and pelvis was performed without the administration of intravenous contrast. Multiplanar reformatted images are provided for review. Automated exposure control, iterative reconstruction, and/or weight-based adjustment of the mA/kV was utilized to reduce the radiation dose to as low as reasonably achievable. COMPARISON: HIDA scan dated 02/20/2024. CLINICAL HISTORY: Abdominal pain, post-op.Postoperative cholecystectomy day 3. FINDINGS: LOWER CHEST: Mild bibasilar atelectasis. LIVER: The liver is unremarkable. GALLBLADDER AND BILE DUCTS: Collection in the gallbladder fossa measures 6.1 x 4.8 cm. The collection is low density. Percutaneous drain extends through the fluid collection. Cholecystectomy clips in the region of the cystic duct. There is no biliary ductal dilatation. The common bile duct is normal caliber. SPLEEN: No acute abnormality. PANCREAS: No evidence of pancreatitis on noncontrast exam. ADRENAL GLANDS: No acute abnormality. KIDNEYS, URETERS AND BLADDER: No stones in the kidneys or ureters. No hydronephrosis. No perinephric or periureteral stranding. Urinary bladder is unremarkable. GI AND BOWEL: Stomach is normal. Duodenum appears normal. The bowel is fluid filled with air-fluid levels suggesting postoperative ileus. No pneumatosis. Stool in the rectum. PERITONEUM AND RETROPERITONEUM: Small amount of scattered gas in the upper peritoneal space related to recent laparoscopic surgery. Small amount of high density fluid collects dependently within the posterior cul de sac with a fluid-fluid level. Small volume hemoperitoneum presumably related to recent surgery. VASCULATURE: Aorta is normal in caliber. LYMPH NODES: No lymphadenopathy. REPRODUCTIVE ORGANS: No acute abnormality. BONES AND SOFT TISSUES: No acute osseous abnormality. No focal soft tissue abnormality. IMPRESSION: 1. Collection in the gallbladder fossa measuring 6.1 x 4.8 cm with percutaneous  drain in place. Findings consistent with predominantly contained bowel gas. 2. Postoperative ileus. 3. Small volume hemoperitoneum complex in the posterior cul de sac , presumably related to recent surgery. 4. These results will be called to the ordering clinician or representative by the radiologist assistant, and communication documented in the pacs or clario dashboard Electronically signed by: Norleen Boxer MD 02/20/2024 03:59 PM EST RP Workstation: [id number]   DG Abd Portable 1V Result Date: 02/20/2024 CLINICAL DATA:  Abdominal distension. EXAM: PORTABLE ABDOMEN - 1 VIEW COMPARISON:  02/18/2024 FINDINGS: Increasing gaseous distention of bowel loops in the central abdomen, primarily colonic distension, however prominent air-filled loops of small bowel are also seen. Drain in the right mid abdomen. Right upper quadrant surgical clips. IMPRESSION: Increasing gaseous distention of bowel loops in the central abdomen, primarily colonic distension, however prominent air-filled loops of small bowel are also seen. Favor generalized ileus. Electronically Signed   By: Andrea Gasman M.D.   On: 02/20/2024 14:05   NM HEPATOBILIARY LEAK (POST-SURGICAL) Result Date: 02/20/2024 EXAM: NM HEPATOBILLARY SCAN 02/20/2024 12:50:37 PM TECHNIQUE: RADIOPHARMACEUTICAL: 5.4 mCi Tc-65m mebrofenin  Dynamic images of the abdomen and pelvis were obtained in the anterior projection for 1 hour after intravenous administration of radiopharmaceutical. The biliary drain was clamped for the second hour of the exam and imaging continued for at least 30 minutes. COMPARISON: None available. CLINICAL HISTORY: Abdominal pain, post surgery or trauma, bile leak suspected. FINDINGS: Homogenous uptake within the liver. Normal clearance of the blood pool. Initial imaging demonstrates excreted radiotracer in the common hepatic duct with evidence of radiotracer within the biliary drain. The drain was clamped for the second hour of the exam. With the  drain clamped, extra biliary radiotracer collects within the gallbladder fossa. Findings consistent with contained bowel leak within the gallbladder fossa. Radiotracer evacuating through the biliary drain when unclamped. No activity in the small bowel. IMPRESSION: 1. Radiotracer evacuates through the biliary drain when unclamped. 2. With drain clamped, extra biliary radiotracer collects within the gallbladder fossa. 3. Findings consistent with biliary leak . No activity in the small bowel suggests brisk leak. 4. These results will be called to the ordering clinician or representative by the radiologist assistant, and communication documented in the pacs or clario dashboard Electronically signed by: Norleen Boxer MD 02/20/2024 01:11 PM EST RP Workstation: [id number]   MR ABDOMEN MRCP WO CONTRAST Result Date: 02/19/2024 CLINICAL DATA:  Postop day 1 from laparoscopic cholecystectomy. Elevated LFTs. Clinical concern for retained common bile duct stone. EXAM: MRI ABDOMEN WITHOUT CONTRAST  (INCLUDING MRCP) TECHNIQUE: Multiplanar multisequence MR imaging of the abdomen was performed. Heavily T2-weighted images of the biliary and pancreatic ducts were obtained, and three-dimensional MRCP images were rendered by post processing. COMPARISON:  Abdomen and pelvis CT 02/16/2024 FINDINGS: Lower chest: Dependent atelectasis with tiny right pleural effusion. Hepatobiliary: No suspicious focal abnormality in the liver on this noncontrast study. There is some periportal edema with edema in the inferior liver around the gallbladder fossa. Heterogeneous signal intensity in the gallbladder fossa likely reflects a combination of postoperative fluid/blood products in packing material. No substantial intrahepatic biliary duct dilatation. Common duct measures 7 mm diameter in the porta hepatis. Common bile duct just proximal to the ampulla is 7 mm diameter. There is no evidence for choledocholithiasis. Pancreas: No focal mass lesion. No  dilatation of the main duct. No intraparenchymal cyst. No peripancreatic edema. Edema around the duodenum and pancreatic head is 2 compatible with recent surgery. Spleen:  No splenomegaly. No suspicious focal mass lesion. Adrenals/Urinary Tract: No adrenal nodule or mass. Bilateral renal cysts evident. Stomach/Bowel: Stomach is moderately distended with fluid. Small bowel and colon shows mild diffuse gaseous distension. Vascular/Lymphatic: No abdominal aortic aneurysm small retroperitoneal lymph nodes are similar to recent CT scan. Other: Edema in the anterior abdominal wall is compatible with the recent surgical access. There is dependent subcutaneous body wall edema bilaterally bilaterally. Musculoskeletal: No overtly suspicious marrow signal abnormality. IMPRESSION: 1. Postop day 1 from cholecystectomy. Heterogeneous signal intensity in the gallbladder fossa likely reflects a combination of postoperative fluid/blood products and packing material. 2. No substantial intrahepatic biliary duct dilatation. Common duct measures 7 mm diameter in the porta hepatis and just proximal to the ampulla. No evidence for choledocholithiasis. 3. Mild diffuse gaseous distension of the small bowel and colon compatible with mild postoperative ileus. 4. Dependent atelectasis with tiny right pleural effusion. Electronically Signed   By: Camellia Candle M.D.   On: 02/19/2024 05:32   DG Abd Portable 1V Result Date: 02/18/2024 CLINICAL DATA:  Distension postop cholecystostomy EXAM: PORTABLE ABDOMEN - 1 VIEW COMPARISON:  CT 02/16/2024 FINDINGS: Postop changes in the right upper quadrant with clips and probable drainage catheter. Mild increased small and large bowel gas without obstruction. Moderate stool IMPRESSION: Mild increased small and large bowel gas without obstruction, possible mild ileus. Moderate stool. Electronically Signed   By: Luke Bun M.D.   On: 02/18/2024 15:59   DG Chest Port 1 View Result Date:  02/18/2024 CLINICAL DATA:  Shortness of breath EXAM: PORTABLE CHEST 1 VIEW COMPARISON:  02/16/2024 FINDINGS: Hypoventilatory change. Minimal atelectasis right base. No consolidation, pleural effusion or pneumothorax. Normal cardiac size. Aortic atherosclerosis. Left shoulder replacement  IMPRESSION: Hypoventilatory changes with minimal atelectasis at the right base. Electronically Signed   By: Luke Bun M.D.   On: 02/18/2024 15:58   CT ABDOMEN PELVIS W CONTRAST Result Date: 02/16/2024 EXAM: CT ABDOMEN AND PELVIS WITH CONTRAST 02/16/2024 07:57:25 PM TECHNIQUE: CT of the abdomen and pelvis was performed with the administration of 100 mL of iohexol  (OMNIPAQUE ) 350 MG/ML injection. Multiplanar reformatted images are provided for review. Automated exposure control, iterative reconstruction, and/or weight-based adjustment of the mA/kV was utilized to reduce the radiation dose to as low as reasonably achievable. COMPARISON: None available. CLINICAL HISTORY: Epigastric pain; cp, epigastric abd pain, leukocytosis. FINDINGS: LOWER CHEST: No acute abnormality. LIVER: Hepatic steatosis. GALLBLADDER AND BILE DUCTS: Distended gallbladder with wall thickening and pericholecystic fluid compatible with acute cholecystitis. No radiopaque stone or biliary ductal dilatation. SPLEEN: No acute abnormality. PANCREAS: No acute abnormality. ADRENAL GLANDS: No acute abnormality. KIDNEYS, URETERS AND BLADDER: No stones in the kidneys or ureters. No hydronephrosis. No perinephric or periureteral stranding. Urinary bladder is unremarkable. GI AND BOWEL: Stomach demonstrates no acute abnormality. Colonic diverticulosis without evidence of diverticulitis. There is no bowel obstruction. PERITONEUM AND RETROPERITONEUM: No ascites. No free air. VASCULATURE: Aorta is normal in caliber. Aortic atherosclerotic calcification. LYMPH NODES: No lymphadenopathy. REPRODUCTIVE ORGANS: No acute abnormality. BONES AND SOFT TISSUES: No acute osseous  abnormality. No focal soft tissue abnormality. IMPRESSION: 1. Acute cholecystitis. Electronically signed by: Norman Gatlin MD 02/16/2024 08:10 PM EST RP Workstation: [id number]   CT Angio Chest PE W and/or Wo Contrast Result Date: 02/16/2024 EXAM: CTA CHEST 02/16/2024 07:57:25 PM TECHNIQUE: CTA of the chest was performed with the administration of 100 mL of iohexol  (OMNIPAQUE ) 350 MG/ML injection. Multiplanar reformatted images are provided for review. MIP images are provided for review. Automated exposure control, iterative reconstruction, and/or weight based adjustment of the mA/kV was utilized to reduce the radiation dose to as low as reasonably achievable. COMPARISON: Comparison with the same day x-ray. CLINICAL HISTORY: Pulmonary embolism (PE) suspected, high prob; cough, cp, abd pain. FINDINGS: PULMONARY ARTERIES: Pulmonary arteries are adequately opacified for evaluation. No acute pulmonary embolus. Main pulmonary artery is normal in caliber. MEDIASTINUM: Coronary artery and aortic atherosclerotic calcification. The heart and pericardium demonstrate no acute abnormality. There is no acute abnormality of the thoracic aorta. LYMPH NODES: No mediastinal, hilar or axillary lymphadenopathy. LUNGS AND PLEURA: Emphysema. Right lower lobe atelectasis. No evidence of pleural effusion or pneumothorax. UPPER ABDOMEN: Hepatic steatosis. SOFT TISSUES AND BONES: No acute bone or soft tissue abnormality. IMPRESSION: 1. No pulmonary embolism. 2. Emphysema. Pulmonary emphysema is an independent risk factor for lung cancer. Recommend consideration for evaluation for a low-dose CT lung cancer screening program. Electronically signed by: Norman Gatlin MD 02/16/2024 08:07 PM EST RP Workstation: [id number]   DG Chest Portable 1 View Result Date: 02/16/2024 CLINICAL DATA:  Cough. EXAM: PORTABLE CHEST 1 VIEW COMPARISON:  05/11/2021. FINDINGS: The heart size and mediastinal contours are within normal limits. There is  elevation of the right diaphragm. Atelectasis is noted at the lung bases bilaterally. No effusion or pneumothorax is seen. 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 IMPRESSION: Elevation of the right diaphragm with atelectasis at the lung bases. Electronically Signed   By: Leita Birmingham M.D.   On: 02/16/2024 17:48    Lebron JINNY Cage, MD  [facility]  If 7PM-7AM, please contact night-coverage [email or web address] 02/23/2024, 6:41 PM   LOS: 7 days   "

## 2024-02-23 NOTE — Progress Notes (Signed)
 2 Days Post-Op   Subjective/Chief Complaint: Had small bm, having flatus, tol diet, has been oob   Objective: Vital signs in last 24 hours: Temp:  [97.8 F (36.6 C)-99.7 F (37.6 C)] 98.8 F (37.1 C) (12/29 0310) Pulse Rate:  [58-69] 69 (12/29 0400) Resp:  [16-26] 19 (12/29 0400) BP: (138-173)/(53-127) 173/72 (12/29 0404) SpO2:  [87 %-100 %] 94 % (12/29 0400) Last BM Date : 02/15/24  Intake/Output from previous day: 12/28 0701 - 12/29 0700 In: 991.5 [P.O.:720; I.V.:145.9; IV Piggyback:125.6] Out: 570 [Urine:400; Drains:170] Intake/Output this shift: No intake/output data recorded.  Ab soft approp tender, incisions clean, jp with bilious drainage  Lab Results:  Recent Labs    02/22/24 0300 02/23/24 0250  WBC 17.1* 14.4*  HGB 9.3* 9.5*  HCT 29.4* 30.0*  PLT 373 402*   BMET Recent Labs    02/21/24 0235 02/22/24 0300  NA 141 139  K 3.5 3.6  CL 104 102  CO2 23 23  GLUCOSE 120* 132*  BUN 22 19  CREATININE 0.59* 0.72  CALCIUM 9.1 8.8*   PT/INR No results for input(s): LABPROT, INR in the last 72 hours. ABG No results for input(s): PHART, HCO3 in the last 72 hours.  Invalid input(s): PCO2, PO2  Studies/Results: DG CHEST PORT 1 VIEW Result Date: 02/22/2024 CLINICAL DATA:  Shortness of breath. EXAM: PORTABLE CHEST 1 VIEW COMPARISON:  02/18/2024 FINDINGS: Normal heart size with stable mediastinal contours. Slight increase in atelectasis at the lung bases. No pulmonary edema, large pleural effusion or pneumothorax. Reverse left shoulder arthroplasty. IMPRESSION: Slight increase in atelectasis at the lung bases. Electronically Signed   By: Andrea Gasman M.D.   On: 02/22/2024 10:51    Anti-infectives: Anti-infectives (From admission, onward)    Start     Dose/Rate Route Frequency Ordered Stop   02/18/24 2000  piperacillin -tazobactam (ZOSYN ) IVPB 3.375 g        3.375 g 12.5 mL/hr over 240 Minutes Intravenous Every 8 hours 02/18/24 1400 02/23/24  1959   02/17/24 2200  piperacillin -tazobactam (ZOSYN ) IVPB 3.375 g        3.375 g 12.5 mL/hr over 240 Minutes Intravenous Every 8 hours 02/17/24 1708 02/18/24 1652   02/17/24 0600  piperacillin -tazobactam (ZOSYN ) IVPB 3.375 g  Status:  Discontinued        3.375 g 12.5 mL/hr over 240 Minutes Intravenous Every 8 hours 02/17/24 0025 02/17/24 1708   02/16/24 2145  piperacillin -tazobactam (ZOSYN ) IVPB 3.375 g        3.375 g 100 mL/hr over 30 Minutes Intravenous  Once 02/16/24 2137 02/16/24 2232       Assessment/Plan: POD 6 lap chole- EW PPD 2 ERCP for leak -continue HHD -monitor JP drainage -continue abx for now -will monitor wbc- may need repeat ct to see if the collection in gb fossa is completely drained -recommend pharm dvt prophylaxis when ok with GI  after ERCP Donnice Bury 02/23/2024

## 2024-02-24 ENCOUNTER — Inpatient Hospital Stay (HOSPITAL_COMMUNITY)

## 2024-02-24 DIAGNOSIS — K838 Other specified diseases of biliary tract: Secondary | ICD-10-CM | POA: Diagnosis not present

## 2024-02-24 DIAGNOSIS — K8050 Calculus of bile duct without cholangitis or cholecystitis without obstruction: Secondary | ICD-10-CM | POA: Diagnosis not present

## 2024-02-24 DIAGNOSIS — K8309 Other cholangitis: Secondary | ICD-10-CM | POA: Diagnosis not present

## 2024-02-24 DIAGNOSIS — K9189 Other postprocedural complications and disorders of digestive system: Secondary | ICD-10-CM | POA: Diagnosis not present

## 2024-02-24 LAB — CULTURE, BLOOD (ROUTINE X 2)
Culture: NO GROWTH
Culture: NO GROWTH

## 2024-02-24 LAB — CBC
HCT: 33.9 % — ABNORMAL LOW (ref 39.0–52.0)
Hemoglobin: 11.2 g/dL — ABNORMAL LOW (ref 13.0–17.0)
MCH: 29.7 pg (ref 26.0–34.0)
MCHC: 33 g/dL (ref 30.0–36.0)
MCV: 89.9 fL (ref 80.0–100.0)
Platelets: 489 K/uL — ABNORMAL HIGH (ref 150–400)
RBC: 3.77 MIL/uL — ABNORMAL LOW (ref 4.22–5.81)
RDW: 13.1 % (ref 11.5–15.5)
WBC: 23.5 K/uL — ABNORMAL HIGH (ref 4.0–10.5)
nRBC: 0.1 % (ref 0.0–0.2)

## 2024-02-24 LAB — COMPREHENSIVE METABOLIC PANEL WITH GFR
ALT: 77 U/L — ABNORMAL HIGH (ref 0–44)
AST: 35 U/L (ref 15–41)
Albumin: 3.6 g/dL (ref 3.5–5.0)
Alkaline Phosphatase: 189 U/L — ABNORMAL HIGH (ref 38–126)
Anion gap: 13 (ref 5–15)
BUN: 9 mg/dL (ref 8–23)
CO2: 22 mmol/L (ref 22–32)
Calcium: 9.3 mg/dL (ref 8.9–10.3)
Chloride: 101 mmol/L (ref 98–111)
Creatinine, Ser: 0.71 mg/dL (ref 0.61–1.24)
GFR, Estimated: >60 mL/min
Glucose, Bld: 134 mg/dL — ABNORMAL HIGH (ref 70–99)
Potassium: 3.5 mmol/L (ref 3.5–5.1)
Sodium: 135 mmol/L (ref 135–145)
Total Bilirubin: 1.2 mg/dL (ref 0.0–1.2)
Total Protein: 6.8 g/dL (ref 6.5–8.1)

## 2024-02-24 MED ORDER — METHOCARBAMOL 500 MG PO TABS: 500.0000 mg | ORAL_TABLET | Freq: Three times a day (TID) | ORAL | Status: DC | PRN

## 2024-02-24 MED ORDER — IOHEXOL 9 MG/ML PO SOLN
500.0000 mL | ORAL | Status: AC
  Administered 2024-02-24 (×2): 500 mL via ORAL

## 2024-02-24 MED ORDER — IOHEXOL 300 MG/ML  SOLN
100.0000 mL | Freq: Once | INTRAMUSCULAR | Status: AC | PRN
  Administered 2024-02-24: 100 mL via INTRAVENOUS

## 2024-02-24 NOTE — Progress Notes (Signed)
 "          PROGRESS NOTE  Nathan Stewart FMW:[id number] DOB: 01-07-1956 DOA: 02/16/2024 PCP: [name], MD  Brief History:  68 y.o. 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 He states the pain resolved, but for the last week he has been having nausea and unable to eat. He has been drinking though and able to keep it down. Pt was found to have acute cholecystitis (no stones seen on CT) on a CT scan. His WBC 18.0 in ED. General surgery consulted and pt taken to surgery for lap chole on 02/17/24.      Today, patient reported a large bowel movement, denies any nausea/vomiting, worsening shortness of breath, chest pain.  Tolerating diet.  Noted persistent leukocytosis     Assessment/Plan: Acute cholecystitis s/p lap chole on 12/23, noted necrotic gallbladder and cystic duct (encountered a bleeding vessel controlled with a clip during surgery) Noted biliary leak s/p lap chole Elevated LFTs s/p lap chole ?Post op ileus Sepsis Currently afebrile, with persistent leukocytosis Improved LFTs Procalcitonin 2.76--->1.22 Chest x-ray with mild atelectasis HIDA scan findings consistent with biliary leak GI consulted, s/p ERCP on 02/21/2024 showed bile leak in the region of the cystic duct stump, 5 mm choledocholithiasis was found, with complete removal with biliary sphincterotomy and balloon extraction, 1 plastic stent was placed into the bile duct.  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,  Hold home antibiotics (cefadroxil ) for now while on Zosyn    Essential hypertension Noted sinus bradycardia Decrease PTA toprol  100 mg-->50 mg IV hydralazine  as needed   Sleep apnea Not on CPAP   Hypokalemia Replace as needed   Hyponatremia Resolved     Consultants:  General surgery, GI  Code Status:  FULL   DVT Prophylaxis:  SCD   Procedures: As Listed in Progress Note Above  Antibiotics: Zosyn  12/23>>   Objective: Vitals:   02/23/24 2225 02/24/24 0134 02/24/24 0542 02/24/24 0957  BP: (!) 157/80 (!) 161/80 (!) 144/76   Pulse: 82 97 95   Resp: 17 17 18    Temp: 98.8 F (37.1 C) 100.2 F (37.9 C) 99 F (37.2 C)   TempSrc: Oral Oral Oral   SpO2: 96% 93% 96% 95%  Weight:      Height:        Intake/Output Summary (Last 24 hours) at 02/24/2024 1128 Last data filed at 02/24/2024 1000 Gross per 24 hour  Intake 240 ml  Output 55 ml  Net 185 ml   Weight change:  Exam: General: NAD  Cardiovascular: S1, S2 present Respiratory: CTAB Abdomen: Soft, nontender, distended, no bowel sounds Musculoskeletal: No bilateral pedal edema noted Skin: Normal Psychiatry: Normal mood     Data Reviewed: I have personally reviewed following labs and imaging studies Basic Metabolic Panel: Recent Labs  Lab 02/19/24 0406 02/20/24 0340 02/21/24 0235 02/22/24 0300 02/24/24 0350  NA 134* 137 141 139 135  K 3.8 3.5 3.5 3.6 3.5  CL 98 100 104 102 101  CO2 23  24 23 23 22   GLUCOSE 131* 102* 120* 132* 134*  BUN 17 23 22 19 9   CREATININE 0.87 0.73 0.59* 0.72 0.71  CALCIUM 9.1 9.2 9.1 8.8* 9.3   Liver Function Tests: Recent Labs  Lab 02/19/24 0406 02/20/24 0340 02/21/24 0235 02/22/24 0300 02/24/24 0350  AST 118* 47* 33 33 35  ALT 333* 202* 135* 95* 77*  ALKPHOS 369* 311* 384* 185* 189*  BILITOT 3.8* 1.8* 1.6* 1.2 1.2  PROT 7.1 6.9 6.9 6.4* 6.8  ALBUMIN  3.5 3.2* 3.4* 3.2* 3.6   No results for input(s): LIPASE, AMYLASE in  the last 168 hours.  No results for input(s): AMMONIA in the last 168 hours. Coagulation Profile: No results for input(s): INR, PROTIME in the last 168 hours. CBC: Recent Labs  Lab 02/18/24 0454 02/18/24 1553 02/20/24 0340 02/21/24 0235 02/22/24 0300 02/23/24 0250 02/24/24 0350  WBC 17.5*   < > 24.8* 23.1* 17.1* 14.4* 23.5*  NEUTROABS 15.1*  --   --   --   --   --   --   HGB 12.0*   < > 10.6* 10.6* 9.3* 9.5* 11.2*  HCT 34.7*   < > 31.8* 32.5* 29.4* 30.0* 33.9*  MCV 86.8   < > 89.6 91.3 91.9 92.6 89.9  PLT 261   < > 354 376 373 402* 489*   < > = values in this interval not displayed.   Cardiac Enzymes: No results for input(s): CKTOTAL, CKMB, CKMBINDEX, TROPONINI in the last 168 hours.  BNP: Invalid input(s): POCBNP CBG: Recent Labs  Lab 02/18/24 1521  GLUCAP 182*   HbA1C: No results for input(s): HGBA1C in the last 72 hours. Urine analysis:    Component Value Date/Time   COLORURINE YELLOW 02/16/2024 1840   APPEARANCEUR CLEAR 02/16/2024 1840   LABSPEC 1.007 02/16/2024 1840   PHURINE 7.0 02/16/2024 1840   GLUCOSEU NEGATIVE 02/16/2024 1840   HGBUR SMALL (A) 02/16/2024 1840   HGBUR trace-lysed 03/20/2010 0814   BILIRUBINUR NEGATIVE 02/16/2024 1840   BILIRUBINUR small (A) 02/13/2024 1641   BILIRUBINUR n 06/07/2013 1107   KETONESUR NEGATIVE 02/16/2024 1840   PROTEINUR NEGATIVE 02/16/2024 1840   UROBILINOGEN 1.0 02/13/2024 1641   UROBILINOGEN 0.2 11/16/2014 0907   NITRITE NEGATIVE 02/16/2024 1840   LEUKOCYTESUR NEGATIVE 02/16/2024 1840   Sepsis Labs: @LABRCNTIP (procalcitonin:4,lacticidven:4) ) Recent Results (from the past 240 hours)  Blood culture (routine x 2)     Status: None   Collection Time: 02/16/24  5:20 PM   Specimen: BLOOD RIGHT HAND  Result Value Ref Range Status   Specimen Description   Final    BLOOD RIGHT HAND Performed at [facility], [address], [place], [place] [address]    Special Requests   Final    BOTTLES  DRAWN AEROBIC AND ANAEROBIC Blood Culture adequate volume Performed at [facility], [address], [place], [place] [address]    Culture   Final    NO GROWTH 5 DAYS Performed at [facility], [address] [place], [place] [address]    Report Status 02/22/2024 FINAL  Final  Resp panel by RT-PCR (RSV, Flu A&B, Covid) Anterior Nasal Swab     Status: None   Collection Time: 02/16/24  5:20 PM   Specimen: Anterior Nasal Swab  Result Value Ref Range Status   SARS Coronavirus 2 by RT PCR NEGATIVE NEGATIVE Final    Comment: (NOTE) SARS-CoV-2 target nucleic acids are NOT DETECTED.  The SARS-CoV-2 RNA is generally  detectable in upper respiratory specimens during the acute phase of infection. The lowest concentration of SARS-CoV-2 viral copies this assay can detect is 138 copies/mL. A negative result does not preclude SARS-Cov-2 infection and should not be used as the sole basis for treatment or other patient management decisions. A negative result may occur with  improper specimen collection/handling, submission of specimen other than nasopharyngeal swab, presence of viral mutation(s) within the areas targeted by this assay, and inadequate number of viral copies(<138 copies/mL). A negative result must be combined with clinical observations, patient history, and epidemiological information. The expected result is Negative.  Fact Sheet for Patients:  bloggercourse.com  Fact Sheet for Healthcare Providers:  seriousbroker.it  This test is no t yet approved or cleared by the [place]  FDA and  has been authorized for detection and/or diagnosis of SARS-CoV-2 by FDA under an Emergency Use Authorization (EUA). This EUA will remain  in effect (meaning this test can be used) for the duration of the COVID-19 declaration under Section 564(b)(1) of the Act, 21 U.S.C.section 360bbb-3(b)(1), unless the authorization is  terminated  or revoked sooner.       Influenza A by PCR NEGATIVE NEGATIVE Final   Influenza B by PCR NEGATIVE NEGATIVE Final    Comment: (NOTE) The Xpert Xpress SARS-CoV-2/FLU/RSV plus assay is intended as an aid in the diagnosis of influenza from Nasopharyngeal swab specimens and should not be used as a sole basis for treatment. Nasal washings and aspirates are unacceptable for Xpert Xpress SARS-CoV-2/FLU/RSV testing.  Fact Sheet for Patients: bloggercourse.com  Fact Sheet for Healthcare Providers: seriousbroker.it  This test is not yet approved or cleared by the [place]  FDA and has been authorized for detection and/or diagnosis of SARS-CoV-2 by FDA under an Emergency Use Authorization (EUA). This EUA will remain in effect (meaning this test can be used) for the duration of the COVID-19 declaration under Section 564(b)(1) of the Act, 21 U.S.C. section 360bbb-3(b)(1), unless the authorization is terminated or revoked.     Resp Syncytial Virus by PCR NEGATIVE NEGATIVE Final    Comment: (NOTE) Fact Sheet for Patients: bloggercourse.com  Fact Sheet for Healthcare Providers: seriousbroker.it  This test is not yet approved or cleared by the [place]  FDA and has been authorized for detection and/or diagnosis of SARS-CoV-2 by FDA under an Emergency Use Authorization (EUA). This EUA will remain in effect (meaning this test can be used) for the duration of the COVID-19 declaration under Section 564(b)(1) of the Act, 21 U.S.C. section 360bbb-3(b)(1), unless the authorization is terminated or revoked.  Performed at [facility], [address], [place], [place] [address]   Blood culture (routine x 2)     Status: None   Collection Time: 02/16/24  5:25 PM   Specimen: BLOOD RIGHT FOREARM  Result Value Ref Range Status   Specimen Description   Final     BLOOD RIGHT FOREARM Performed at [facility], [address] [place], [place] [address]    Special Requests   Final    BOTTLES DRAWN AEROBIC AND ANAEROBIC Blood Culture adequate volume Performed at [facility], [address] [place], [place] [address]    Culture   Final    NO GROWTH 5 DAYS Performed at [facility], [address], [place], [place] [address]    Report Status 02/22/2024 FINAL  Final  Culture, blood (Routine X 2) w Reflex to ID Panel     Status: None   Collection Time:  02/19/24  7:58 AM   Specimen: BLOOD RIGHT ARM  Result Value Ref Range Status   Specimen Description   Final    BLOOD RIGHT ARM Performed at [facility], [address], [place], [place] [address]    Special Requests   Final    BOTTLES DRAWN AEROBIC ONLY Blood Culture results may not be optimal due to an inadequate volume of blood received in culture bottles Performed at [facility], [address], [place], [place] [address]    Culture   Final    NO GROWTH 5 DAYS Performed at [facility], [address], [place], [place] [address]    Report Status 02/24/2024 FINAL  Final  Culture, blood (Routine X 2) w Reflex to ID Panel     Status: None   Collection Time: 02/19/24  7:58 AM   Specimen: BLOOD RIGHT HAND  Result Value Ref Range Status   Specimen Description   Final    BLOOD RIGHT HAND Performed at [facility], [address] [place], [place] [address]    Special Requests   Final    BOTTLES DRAWN AEROBIC ONLY Blood Culture results may not be optimal due to an inadequate volume of blood received in culture bottles Performed at [facility], [address], [place], [place] [address]    Culture   Final    NO GROWTH 5 DAYS Performed at [facility], [address] [place], [place] [address]    Report Status 02/24/2024 FINAL  Final  MRSA Next Gen by PCR, Nasal     Status: None   Collection  Time: 02/20/24  7:24 PM   Specimen: Nasal Mucosa; Nasal Swab  Result Value Ref Range Status   MRSA by PCR Next Gen NOT DETECTED NOT DETECTED Final    Comment: (NOTE) The GeneXpert MRSA Assay (FDA approved for NASAL specimens only), is one component of a comprehensive MRSA colonization surveillance program. It is not intended to diagnose MRSA infection nor to guide or monitor treatment for MRSA infections. Test performance is not FDA approved in patients less than 4 years old. Performed at [facility], [address], [place], [place] [address]      Scheduled Meds:  acetaminophen   1,000 mg Oral Q6H   amLODipine   5 mg Oral Daily   enoxaparin  (LOVENOX ) injection  40 mg Subcutaneous Q24H   feeding supplement  1 Container Oral TID BM   hydrochlorothiazide   25 mg Oral Daily   ipratropium-albuterol   3 mL Nebulization BID   metoprolol  succinate  50 mg Oral Daily   polyethylene glycol  17 g Oral Daily   senna-docusate  1 tablet Oral QHS   Continuous Infusions:  piperacillin -tazobactam (ZOSYN )  IV 3.375 g (02/24/24 0528)     Procedures/Studies: DG CHEST PORT 1 VIEW Result Date: 02/22/2024 CLINICAL DATA:  Shortness of breath. EXAM: PORTABLE CHEST 1 VIEW COMPARISON:  02/18/2024 FINDINGS: Normal heart size with stable mediastinal contours. Slight increase in atelectasis at the lung bases. No pulmonary edema, large pleural effusion or pneumothorax. Reverse left shoulder arthroplasty. IMPRESSION: Slight increase in atelectasis at the lung bases. Electronically Signed   By: Andrea Gasman M.D.   On: 02/22/2024 10:51   CT ABDOMEN PELVIS WO CONTRAST Result Date: 02/20/2024 EXAM: CT ABDOMEN AND PELVIS WITHOUT CONTRAST 02/20/2024 01:22:00 PM TECHNIQUE: CT of the abdomen and pelvis was performed without the administration of intravenous contrast. Multiplanar reformatted images are provided for  review. Automated exposure control, iterative reconstruction, and/or weight-based  adjustment of the mA/kV was utilized to reduce the radiation dose to as low as reasonably achievable. COMPARISON: HIDA scan dated 02/20/2024. CLINICAL HISTORY: Abdominal pain, post-op.Postoperative cholecystectomy day 3. FINDINGS: LOWER CHEST: Mild bibasilar atelectasis. LIVER: The liver is unremarkable. GALLBLADDER AND BILE DUCTS: Collection in the gallbladder fossa measures 6.1 x 4.8 cm. The collection is low density. Percutaneous drain extends through the fluid collection. Cholecystectomy clips in the region of the cystic duct. There is no biliary ductal dilatation. The common bile duct is normal caliber. SPLEEN: No acute abnormality. PANCREAS: No evidence of pancreatitis on noncontrast exam. ADRENAL GLANDS: No acute abnormality. KIDNEYS, URETERS AND BLADDER: No stones in the kidneys or ureters. No hydronephrosis. No perinephric or periureteral stranding. Urinary bladder is unremarkable. GI AND BOWEL: Stomach is normal. Duodenum appears normal. The bowel is fluid filled with air-fluid levels suggesting postoperative ileus. No pneumatosis. Stool in the rectum. PERITONEUM AND RETROPERITONEUM: Small amount of scattered gas in the upper peritoneal space related to recent laparoscopic surgery. Small amount of high density fluid collects dependently within the posterior cul de sac with a fluid-fluid level. Small volume hemoperitoneum presumably related to recent surgery. VASCULATURE: Aorta is normal in caliber. LYMPH NODES: No lymphadenopathy. REPRODUCTIVE ORGANS: No acute abnormality. BONES AND SOFT TISSUES: No acute osseous abnormality. No focal soft tissue abnormality. IMPRESSION: 1. Collection in the gallbladder fossa measuring 6.1 x 4.8 cm with percutaneous drain in place. Findings consistent with predominantly contained bowel gas. 2. Postoperative ileus. 3. Small volume hemoperitoneum complex in the posterior cul de sac , presumably related to recent surgery. 4. These results will be called to the ordering  clinician or representative by the radiologist assistant, and communication documented in the pacs or clario dashboard Electronically signed by: Norleen Boxer MD 02/20/2024 03:59 PM EST RP Workstation: [id number]   DG Abd Portable 1V Result Date: 02/20/2024 CLINICAL DATA:  Abdominal distension. EXAM: PORTABLE ABDOMEN - 1 VIEW COMPARISON:  02/18/2024 FINDINGS: Increasing gaseous distention of bowel loops in the central abdomen, primarily colonic distension, however prominent air-filled loops of small bowel are also seen. Drain in the right mid abdomen. Right upper quadrant surgical clips. IMPRESSION: Increasing gaseous distention of bowel loops in the central abdomen, primarily colonic distension, however prominent air-filled loops of small bowel are also seen. Favor generalized ileus. Electronically Signed   By: Andrea Gasman M.D.   On: 02/20/2024 14:05   NM HEPATOBILIARY LEAK (POST-SURGICAL) Result Date: 02/20/2024 EXAM: NM HEPATOBILLARY SCAN 02/20/2024 12:50:37 PM TECHNIQUE: RADIOPHARMACEUTICAL: 5.4 mCi Tc-5m mebrofenin  Dynamic images of the abdomen and pelvis were obtained in the anterior projection for 1 hour after intravenous administration of radiopharmaceutical. The biliary drain was clamped for the second hour of the exam and imaging continued for at least 30 minutes. COMPARISON: None available. CLINICAL HISTORY: Abdominal pain, post surgery or trauma, bile leak suspected. FINDINGS: Homogenous uptake within the liver. Normal clearance of the blood pool. Initial imaging demonstrates excreted radiotracer in the common hepatic duct with evidence of radiotracer within the biliary drain. The drain was clamped for the second hour of the exam. With the drain clamped, extra biliary radiotracer collects within the gallbladder fossa. Findings consistent with contained bowel leak within the gallbladder fossa. Radiotracer evacuating through the biliary drain when unclamped. No activity in the small bowel.  IMPRESSION: 1. Radiotracer evacuates through the biliary drain when unclamped. 2. With drain clamped, extra biliary radiotracer collects within the gallbladder fossa. 3. Findings consistent with biliary leak .  No activity in the small bowel suggests brisk leak. 4. These results will be called to the ordering clinician or representative by the radiologist assistant, and communication documented in the pacs or clario dashboard Electronically signed by: Norleen Boxer MD 02/20/2024 01:11 PM EST RP Workstation: [id number]   MR ABDOMEN MRCP WO CONTRAST Result Date: 02/19/2024 CLINICAL DATA:  Postop day 1 from laparoscopic cholecystectomy. Elevated LFTs. Clinical concern for retained common bile duct stone. EXAM: MRI ABDOMEN WITHOUT CONTRAST  (INCLUDING MRCP) TECHNIQUE: Multiplanar multisequence MR imaging of the abdomen was performed. Heavily T2-weighted images of the biliary and pancreatic ducts were obtained, and three-dimensional MRCP images were rendered by post processing. COMPARISON:  Abdomen and pelvis CT 02/16/2024 FINDINGS: Lower chest: Dependent atelectasis with tiny right pleural effusion. Hepatobiliary: No suspicious focal abnormality in the liver on this noncontrast study. There is some periportal edema with edema in the inferior liver around the gallbladder fossa. Heterogeneous signal intensity in the gallbladder fossa likely reflects a combination of postoperative fluid/blood products in packing material. No substantial intrahepatic biliary duct dilatation. Common duct measures 7 mm diameter in the porta hepatis. Common bile duct just proximal to the ampulla is 7 mm diameter. There is no evidence for choledocholithiasis. Pancreas: No focal mass lesion. No dilatation of the main duct. No intraparenchymal cyst. No peripancreatic edema. Edema around the duodenum and pancreatic head is 2 compatible with recent surgery. Spleen:  No splenomegaly. No suspicious focal mass lesion. Adrenals/Urinary Tract: No  adrenal nodule or mass. Bilateral renal cysts evident. Stomach/Bowel: Stomach is moderately distended with fluid. Small bowel and colon shows mild diffuse gaseous distension. Vascular/Lymphatic: No abdominal aortic aneurysm small retroperitoneal lymph nodes are similar to recent CT scan. Other: Edema in the anterior abdominal wall is compatible with the recent surgical access. There is dependent subcutaneous body wall edema bilaterally bilaterally. Musculoskeletal: No overtly suspicious marrow signal abnormality. IMPRESSION: 1. Postop day 1 from cholecystectomy. Heterogeneous signal intensity in the gallbladder fossa likely reflects a combination of postoperative fluid/blood products and packing material. 2. No substantial intrahepatic biliary duct dilatation. Common duct measures 7 mm diameter in the porta hepatis and just proximal to the ampulla. No evidence for choledocholithiasis. 3. Mild diffuse gaseous distension of the small bowel and colon compatible with mild postoperative ileus. 4. Dependent atelectasis with tiny right pleural effusion. Electronically Signed   By: Camellia Candle M.D.   On: 02/19/2024 05:32   DG Abd Portable 1V Result Date: 02/18/2024 CLINICAL DATA:  Distension postop cholecystostomy EXAM: PORTABLE ABDOMEN - 1 VIEW COMPARISON:  CT 02/16/2024 FINDINGS: Postop changes in the right upper quadrant with clips and probable drainage catheter. Mild increased small and large bowel gas without obstruction. Moderate stool IMPRESSION: Mild increased small and large bowel gas without obstruction, possible mild ileus. Moderate stool. Electronically Signed   By: Luke Bun M.D.   On: 02/18/2024 15:59   DG Chest Port 1 View Result Date: 02/18/2024 CLINICAL DATA:  Shortness of breath EXAM: PORTABLE CHEST 1 VIEW COMPARISON:  02/16/2024 FINDINGS: Hypoventilatory change. Minimal atelectasis right base. No consolidation, pleural effusion or pneumothorax. Normal cardiac size. Aortic atherosclerosis.  Left shoulder replacement IMPRESSION: Hypoventilatory changes with minimal atelectasis at the right base. Electronically Signed   By: Luke Bun M.D.   On: 02/18/2024 15:58   CT ABDOMEN PELVIS W CONTRAST Result Date: 02/16/2024 EXAM: CT ABDOMEN AND PELVIS WITH CONTRAST 02/16/2024 07:57:25 PM TECHNIQUE: CT of the abdomen and pelvis was performed with the administration of 100 mL of iohexol  (OMNIPAQUE ) 350  MG/ML injection. Multiplanar reformatted images are provided for review. Automated exposure control, iterative reconstruction, and/or weight-based adjustment of the mA/kV was utilized to reduce the radiation dose to as low as reasonably achievable. COMPARISON: None available. CLINICAL HISTORY: Epigastric pain; cp, epigastric abd pain, leukocytosis. FINDINGS: LOWER CHEST: No acute abnormality. LIVER: Hepatic steatosis. GALLBLADDER AND BILE DUCTS: Distended gallbladder with wall thickening and pericholecystic fluid compatible with acute cholecystitis. No radiopaque stone or biliary ductal dilatation. SPLEEN: No acute abnormality. PANCREAS: No acute abnormality. ADRENAL GLANDS: No acute abnormality. KIDNEYS, URETERS AND BLADDER: No stones in the kidneys or ureters. No hydronephrosis. No perinephric or periureteral stranding. Urinary bladder is unremarkable. GI AND BOWEL: Stomach demonstrates no acute abnormality. Colonic diverticulosis without evidence of diverticulitis. There is no bowel obstruction. PERITONEUM AND RETROPERITONEUM: No ascites. No free air. VASCULATURE: Aorta is normal in caliber. Aortic atherosclerotic calcification. LYMPH NODES: No lymphadenopathy. REPRODUCTIVE ORGANS: No acute abnormality. BONES AND SOFT TISSUES: No acute osseous abnormality. No focal soft tissue abnormality. IMPRESSION: 1. Acute cholecystitis. Electronically signed by: Norman Gatlin MD 02/16/2024 08:10 PM EST RP Workstation: [id number]   CT Angio Chest PE W and/or Wo Contrast Result Date: 02/16/2024 EXAM: CTA CHEST  02/16/2024 07:57:25 PM TECHNIQUE: CTA of the chest was performed with the administration of 100 mL of iohexol  (OMNIPAQUE ) 350 MG/ML injection. Multiplanar reformatted images are provided for review. MIP images are provided for review. Automated exposure control, iterative reconstruction, and/or weight based adjustment of the mA/kV was utilized to reduce the radiation dose to as low as reasonably achievable. COMPARISON: Comparison with the same day x-ray. CLINICAL HISTORY: Pulmonary embolism (PE) suspected, high prob; cough, cp, abd pain. FINDINGS: PULMONARY ARTERIES: Pulmonary arteries are adequately opacified for evaluation. No acute pulmonary embolus. Main pulmonary artery is normal in caliber. MEDIASTINUM: Coronary artery and aortic atherosclerotic calcification. The heart and pericardium demonstrate no acute abnormality. There is no acute abnormality of the thoracic aorta. LYMPH NODES: No mediastinal, hilar or axillary lymphadenopathy. LUNGS AND PLEURA: Emphysema. Right lower lobe atelectasis. No evidence of pleural effusion or pneumothorax. UPPER ABDOMEN: Hepatic steatosis. SOFT TISSUES AND BONES: No acute bone or soft tissue abnormality. IMPRESSION: 1. No pulmonary embolism. 2. Emphysema. Pulmonary emphysema is an independent risk factor for lung cancer. Recommend consideration for evaluation for a low-dose CT lung cancer screening program. Electronically signed by: Norman Gatlin MD 02/16/2024 08:07 PM EST RP Workstation: [id number]   DG Chest Portable 1 View Result Date: 02/16/2024 CLINICAL DATA:  Cough. EXAM: PORTABLE CHEST 1 VIEW COMPARISON:  05/11/2021. FINDINGS: The heart size and mediastinal contours are within normal limits. There is elevation of the right diaphragm. Atelectasis is noted at the lung bases bilaterally. No effusion or pneumothorax is seen. 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 IMPRESSION: Elevation of the right diaphragm with atelectasis at the lung bases. Electronically  Signed   By: Leita Birmingham M.D.   On: 02/16/2024 17:48    Lebron JINNY Cage, MD  [facility]  If 7PM-7AM, please contact night-coverage [email or web address] 02/24/2024, 11:28 AM   LOS: 8 days   "

## 2024-02-24 NOTE — Progress Notes (Signed)
 3 Days Post-Op   Subjective/Chief Complaint: Having bowel function, tol diet   Objective: Vital signs in last 24 hours: Temp:  [97.9 F (36.6 C)-100.2 F (37.9 C)] 99 F (37.2 C) (12/30 0542) Pulse Rate:  [72-97] 95 (12/30 0542) Resp:  [17-25] 18 (12/30 0542) BP: (144-172)/(71-87) 144/76 (12/30 0542) SpO2:  [93 %-96 %] 96 % (12/30 0542) Last BM Date : 02/23/24  Intake/Output from previous day: 12/29 0701 - 12/30 0700 In: 250 [P.O.:250] Out: 705 [Urine:650; Drains:55] Intake/Output this shift: No intake/output data recorded.  Ab soft approp tender not much in jp tdoay, dressings clean  Lab Results:  Recent Labs    02/23/24 0250 02/24/24 0350  WBC 14.4* 23.5*  HGB 9.5* 11.2*  HCT 30.0* 33.9*  PLT 402* 489*   BMET Recent Labs    02/22/24 0300 02/24/24 0350  NA 139 135  K 3.6 3.5  CL 102 101  CO2 23 22  GLUCOSE 132* 134*  BUN 19 9  CREATININE 0.72 0.71  CALCIUM 8.8* 9.3   PT/INR No results for input(s): LABPROT, INR in the last 72 hours. ABG No results for input(s): PHART, HCO3 in the last 72 hours.  Invalid input(s): PCO2, PO2  Studies/Results: No results found.  Anti-infectives: Anti-infectives (From admission, onward)    Start     Dose/Rate Route Frequency Ordered Stop   02/23/24 2000  piperacillin -tazobactam (ZOSYN ) IVPB 3.375 g        3.375 g 12.5 mL/hr over 240 Minutes Intravenous Every 8 hours 02/23/24 1848     02/18/24 2000  piperacillin -tazobactam (ZOSYN ) IVPB 3.375 g        3.375 g 12.5 mL/hr over 240 Minutes Intravenous Every 8 hours 02/18/24 1400 02/23/24 1646   02/17/24 2200  piperacillin -tazobactam (ZOSYN ) IVPB 3.375 g        3.375 g 12.5 mL/hr over 240 Minutes Intravenous Every 8 hours 02/17/24 1708 02/18/24 1652   02/17/24 0600  piperacillin -tazobactam (ZOSYN ) IVPB 3.375 g  Status:  Discontinued        3.375 g 12.5 mL/hr over 240 Minutes Intravenous Every 8 hours 02/17/24 0025 02/17/24 1708   02/16/24 2145   piperacillin -tazobactam (ZOSYN ) IVPB 3.375 g        3.375 g 100 mL/hr over 30 Minutes Intravenous  Once 02/16/24 2137 02/16/24 2232       Assessment/Plan: POD 7 lap chole- EW PPD 3 ERCP for leak -continue HHD -monitor JP drainage -continue abx for now -wbc up- CT today -lovenox   Donnice Bury 02/24/2024

## 2024-02-25 ENCOUNTER — Inpatient Hospital Stay (HOSPITAL_COMMUNITY)

## 2024-02-25 LAB — CBC
HCT: 32.2 % — ABNORMAL LOW (ref 39.0–52.0)
Hemoglobin: 10.6 g/dL — ABNORMAL LOW (ref 13.0–17.0)
MCH: 29.4 pg (ref 26.0–34.0)
MCHC: 32.9 g/dL (ref 30.0–36.0)
MCV: 89.2 fL (ref 80.0–100.0)
Platelets: 495 K/uL — ABNORMAL HIGH (ref 150–400)
RBC: 3.61 MIL/uL — ABNORMAL LOW (ref 4.22–5.81)
RDW: 13.2 % (ref 11.5–15.5)
WBC: 20.7 K/uL — ABNORMAL HIGH (ref 4.0–10.5)
nRBC: 0.1 % (ref 0.0–0.2)

## 2024-02-25 LAB — PROTIME-INR
INR: 1.1 (ref 0.8–1.2)
Prothrombin Time: 14.5 s (ref 11.4–15.2)

## 2024-02-25 LAB — COMPREHENSIVE METABOLIC PANEL WITH GFR
ALT: 56 U/L — ABNORMAL HIGH (ref 0–44)
AST: 20 U/L (ref 15–41)
Albumin: 3.6 g/dL (ref 3.5–5.0)
Alkaline Phosphatase: 167 U/L — ABNORMAL HIGH (ref 38–126)
Anion gap: 12 (ref 5–15)
BUN: 9 mg/dL (ref 8–23)
CO2: 23 mmol/L (ref 22–32)
Calcium: 9.3 mg/dL (ref 8.9–10.3)
Chloride: 99 mmol/L (ref 98–111)
Creatinine, Ser: 0.7 mg/dL (ref 0.61–1.24)
GFR, Estimated: >60 mL/min
Glucose, Bld: 144 mg/dL — ABNORMAL HIGH (ref 70–99)
Potassium: 3.1 mmol/L — ABNORMAL LOW (ref 3.5–5.1)
Sodium: 134 mmol/L — ABNORMAL LOW (ref 135–145)
Total Bilirubin: 1 mg/dL (ref 0.0–1.2)
Total Protein: 6.9 g/dL (ref 6.5–8.1)

## 2024-02-25 MED ORDER — FENTANYL CITRATE (PF) 100 MCG/2ML IJ SOLN
INTRAMUSCULAR | Status: AC | PRN
  Administered 2024-02-25 (×3): 50 ug via INTRAVENOUS

## 2024-02-25 MED ORDER — MIDAZOLAM HCL 2 MG/2ML IJ SOLN
INTRAMUSCULAR | Status: AC
  Filled 2024-02-25: qty 2

## 2024-02-25 MED ORDER — POTASSIUM CHLORIDE CRYS ER 20 MEQ PO TBCR
40.0000 meq | EXTENDED_RELEASE_TABLET | Freq: Two times a day (BID) | ORAL | Status: AC
  Administered 2024-02-25 – 2024-02-26 (×2): 40 meq via ORAL
  Filled 2024-02-25 (×2): qty 2

## 2024-02-25 MED ORDER — FENTANYL CITRATE (PF) 100 MCG/2ML IJ SOLN
INTRAMUSCULAR | Status: AC
  Filled 2024-02-25: qty 2

## 2024-02-25 MED ORDER — MIDAZOLAM HCL (PF) 2 MG/2ML IJ SOLN
INTRAMUSCULAR | Status: AC | PRN
  Administered 2024-02-25 (×2): 1 mg via INTRAVENOUS

## 2024-02-25 NOTE — Plan of Care (Signed)
   Problem: Health Behavior/Discharge Planning: Goal: Ability to manage health-related needs will improve Outcome: Progressing   Problem: Activity: Goal: Risk for activity intolerance will decrease Outcome: Progressing

## 2024-02-25 NOTE — Progress Notes (Signed)
 "          PROGRESS NOTE  Nathan Stewart FMW:[id number] DOB: January 22, 1956 DOA: 02/16/2024 PCP: [name], MD  Brief History:  68 y.o. 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 He states the pain resolved, but for the last week he has been having nausea and unable to eat. He has been drinking though and able to keep it down. Pt was found to have acute cholecystitis (no stones seen on CT) on a CT scan. His WBC 18.0 in ED. General surgery consulted and pt taken to surgery for lap chole on 02/17/24.   Patient has had continued right lower quadrant right pelvic pain.  Follow-up postoperative CT of abdomen and pelvis reveals maturing fluid collection within the pelvis with early rim enhancement and loculation measuring 6.4 x 5.6 x 3.5 cm.  General surgery has consulted interventional radiology and plans are to pursue percutaneous drainage of fluid collection today.   Assessment/Plan: Acute cholecystitis s/p lap chole on 12/23, noted necrotic gallbladder and cystic duct (encountered a bleeding vessel controlled with a clip during surgery) Biliary leak s/p lap chole Right pelvic fluid collection concerning for abscess CT findings concerning for postop ileus versus distal bowel obstruction CT findings concerning for interval development of diverticulitis versus colitis Currently afebrile, with persistent leukocytosis Procalcitonin 2.76--->1.22 12/31: Tmax in past 24 hours 99.7-WBC has decreased from 23,000 to 20,000 HIDA scan findings consistent with biliary leak GI consulted, s/p ERCP on 02/21/2024 showed bile leak in the region of the cystic duct stump, 5 mm choledocholithiasis was found, with complete removal with biliary sphincterotomy and balloon extraction, 1 plastic stent was placed into the bile duct.  Stent  removal as outpatient 2 to 3 months 12/30: CT abdomen as above revealing fluid collection concerning for abscess in the right pelvis measuring 6.4 x 5.6 x 3.5 cm-IR consulted and he underwent percutaneous catheter drainage of fluid collection today on 12/31 12/30: CT also concerning for possible diverticulitis versus colitis-suspect may be inflammatory change from fluid collections in the pelvis-continue to monitor-antibiotics as below BC x 2 no growth to date, repeat NGTD Continue IV Zosyn , n.p.o. today 12/31 for procedure-apparently had been tolerating heart healthy diet-resume postprocedure  Acute hypokalemia K-Dur 40 mEq twice daily x 2 doses Labs in a.m.  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,  Hold home antibiotics (cefadroxil ) for now while on Zosyn    Essential hypertension Noted sinus bradycardia Decrease PTA toprol  100 mg-->50 mg IV hydralazine  as needed   Sleep apnea Not on CPAP   Hypokalemia Replace as needed   Hyponatremia Resolved     Consultants:  General surgery, GI  Code Status:  FULL   DVT Prophylaxis:  SCD   Procedures: As Listed in Progress Note Above  Antibiotics: Zosyn  12/23>>   Objective: Vitals:   02/24/24 2218 02/24/24 2232 02/25/24 0614 02/25/24 0622  BP:  135/60 (!) 143/78   Pulse:  87 89   Resp:  18 17   Temp:  98.6 F (37 C) 99.6 F (37.6 C)   TempSrc:  Oral Oral   SpO2: 97% 96% 95% 95%  Weight:      Height:  Intake/Output Summary (Last 24 hours) at 02/25/2024 9187 Last data filed at 02/25/2024 0600 Gross per 24 hour  Intake 543.76 ml  Output 20 ml  Net 523.76 ml   Weight change:  Exam: General: NAD  Cardiovascular: S1, S2 -normotensive, peripheral pulses palpable, adequate capillary  refill, no rubs or murmurs. Respiratory: CTAB-stable on room air, no increased work of breathing Abdomen: Soft, nontender, distended, hypoactive bowel sounds-focal tenderness in the right lower quadrant Musculoskeletal: No bilateral pedal edema noted Skin: Normal Psychiatry: Normal mood     Data Reviewed: I have personally reviewed following labs and imaging studies  Basic Metabolic Panel: Recent Labs  Lab 02/20/24 0340 02/21/24 0235 02/22/24 0300 02/24/24 0350 02/25/24 0355  NA 137 141 139 135 134*  K 3.5 3.5 3.6 3.5 3.1*  CL 100 104 102 101 99  CO2 24 23 23 22 23   GLUCOSE 102* 120* 132* 134* 144*  BUN 23 22 19 9 9   CREATININE 0.73 0.59* 0.72 0.71 0.70  CALCIUM 9.2 9.1 8.8* 9.3 9.3   Liver Function Tests: Recent Labs  Lab 02/20/24 0340 02/21/24 0235 02/22/24 0300 02/24/24 0350 02/25/24 0355  AST 47* 33 33 35 20  ALT 202* 135* 95* 77* 56*  ALKPHOS 311* 384* 185* 189* 167*  BILITOT 1.8* 1.6* 1.2 1.2 1.0  PROT 6.9 6.9 6.4* 6.8 6.9  ALBUMIN  3.2* 3.4* 3.2* 3.6 3.6   No results for input(s): LIPASE, AMYLASE in the last 168 hours.  No results for input(s): AMMONIA in the last 168 hours. Coagulation Profile: No results for input(s): INR, PROTIME in the last 168 hours. CBC: Recent Labs  Lab 02/21/24 0235 02/22/24 0300 02/23/24 0250 02/24/24 0350 02/25/24 0355  WBC 23.1* 17.1* 14.4* 23.5* 20.7*  HGB 10.6* 9.3* 9.5* 11.2* 10.6*  HCT 32.5* 29.4* 30.0* 33.9* 32.2*  MCV 91.3 91.9 92.6 89.9 89.2  PLT 376 373 402* 489* 495*   Cardiac Enzymes: No results for input(s): CKTOTAL, CKMB, CKMBINDEX, TROPONINI in the last 168 hours.  BNP: Invalid input(s): POCBNP CBG: Recent Labs  Lab 02/18/24 1521  GLUCAP 182*   HbA1C: No results for input(s): HGBA1C in the last 72 hours. Urine analysis:    Component Value Date/Time   COLORURINE YELLOW 02/16/2024 1840   APPEARANCEUR CLEAR 02/16/2024 1840   LABSPEC 1.007 02/16/2024 1840   PHURINE 7.0  02/16/2024 1840   GLUCOSEU NEGATIVE 02/16/2024 1840   HGBUR SMALL (A) 02/16/2024 1840   HGBUR trace-lysed 03/20/2010 0814   BILIRUBINUR NEGATIVE 02/16/2024 1840   BILIRUBINUR small (A) 02/13/2024 1641   BILIRUBINUR n 06/07/2013 1107   KETONESUR NEGATIVE 02/16/2024 1840   PROTEINUR NEGATIVE 02/16/2024 1840   UROBILINOGEN 1.0 02/13/2024 1641   UROBILINOGEN 0.2 11/16/2014 0907   NITRITE NEGATIVE 02/16/2024 1840   LEUKOCYTESUR NEGATIVE 02/16/2024 1840   Sepsis Labs: @LABRCNTIP (procalcitonin:4,lacticidven:4) ) Recent Results (from the past 240 hours)  Blood culture (routine x 2)     Status: None   Collection Time: 02/16/24  5:20 PM   Specimen: BLOOD RIGHT HAND  Result Value Ref Range Status   Specimen Description   Final    BLOOD RIGHT HAND Performed at [facility], [address], [place], [place] [address]    Special Requests   Final    BOTTLES DRAWN AEROBIC AND ANAEROBIC Blood Culture adequate volume Performed at [facility], [address] [place], [place] [address]    Culture   Final    NO GROWTH 5 DAYS Performed at [facility],  [address], [place], [place] [address]    Report Status 02/22/2024 FINAL  Final  Resp panel by RT-PCR (RSV, Flu A&B, Covid) Anterior Nasal Swab     Status: None   Collection Time: 02/16/24  5:20 PM   Specimen: Anterior Nasal Swab  Result Value Ref Range Status   SARS Coronavirus 2 by RT PCR NEGATIVE NEGATIVE Final    Comment: (NOTE) SARS-CoV-2 target nucleic acids are NOT DETECTED.  The SARS-CoV-2 RNA is generally detectable in upper respiratory specimens during the acute phase of infection. The lowest concentration of SARS-CoV-2 viral copies this assay can detect is 138 copies/mL. A negative result does not preclude SARS-Cov-2 infection and should not be used as the sole basis for treatment or other patient management decisions. A negative result may occur with  improper specimen collection/handling,  submission of specimen other than nasopharyngeal swab, presence of viral mutation(s) within the areas targeted by this assay, and inadequate number of viral copies(<138 copies/mL). A negative result must be combined with clinical observations, patient history, and epidemiological information. The expected result is Negative.  Fact Sheet for Patients:  bloggercourse.com  Fact Sheet for Healthcare Providers:  seriousbroker.it  This test is no t yet approved or cleared by the [place]  FDA and  has been authorized for detection and/or diagnosis of SARS-CoV-2 by FDA under an Emergency Use Authorization (EUA). This EUA will remain  in effect (meaning this test can be used) for the duration of the COVID-19 declaration under Section 564(b)(1) of the Act, 21 U.S.C.section 360bbb-3(b)(1), unless the authorization is terminated  or revoked sooner.       Influenza A by PCR NEGATIVE NEGATIVE Final   Influenza B by PCR NEGATIVE NEGATIVE Final    Comment: (NOTE) The Xpert Xpress SARS-CoV-2/FLU/RSV plus assay is intended as an aid in the diagnosis of influenza from Nasopharyngeal swab specimens and should not be used as a sole basis for treatment. Nasal washings and aspirates are unacceptable for Xpert Xpress SARS-CoV-2/FLU/RSV testing.  Fact Sheet for Patients: bloggercourse.com  Fact Sheet for Healthcare Providers: seriousbroker.it  This test is not yet approved or cleared by the [place]  FDA and has been authorized for detection and/or diagnosis of SARS-CoV-2 by FDA under an Emergency Use Authorization (EUA). This EUA will remain in effect (meaning this test can be used) for the duration of the COVID-19 declaration under Section 564(b)(1) of the Act, 21 U.S.C. section 360bbb-3(b)(1), unless the authorization is terminated or revoked.     Resp Syncytial Virus by PCR NEGATIVE  NEGATIVE Final    Comment: (NOTE) Fact Sheet for Patients: bloggercourse.com  Fact Sheet for Healthcare Providers: seriousbroker.it  This test is not yet approved or cleared by the [place]  FDA and has been authorized for detection and/or diagnosis of SARS-CoV-2 by FDA under an Emergency Use Authorization (EUA). This EUA will remain in effect (meaning this test can be used) for the duration of the COVID-19 declaration under Section 564(b)(1) of the Act, 21 U.S.C. section 360bbb-3(b)(1), unless the authorization is terminated or revoked.  Performed at [facility], [address] [place], [place] [address]   Blood culture (routine x 2)     Status: None   Collection Time: 02/16/24  5:25 PM   Specimen: BLOOD RIGHT FOREARM  Result Value Ref Range Status   Specimen Description   Final    BLOOD RIGHT FOREARM Performed at [facility], [address] [place], [place] [address]    Special Requests  Final    BOTTLES DRAWN AEROBIC AND ANAEROBIC Blood Culture adequate volume Performed at [facility], [address] [place], [place] [address]    Culture   Final    NO GROWTH 5 DAYS Performed at [facility], [address] [place], [place] [address]    Report Status 02/22/2024 FINAL  Final  Culture, blood (Routine X 2) w Reflex to ID Panel     Status: None   Collection Time: 02/19/24  7:58 AM   Specimen: BLOOD RIGHT ARM  Result Value Ref Range Status   Specimen Description   Final    BLOOD RIGHT ARM Performed at [facility], [address], [place], [place] [address]    Special Requests   Final    BOTTLES DRAWN AEROBIC ONLY Blood Culture results may not be optimal due to an inadequate volume of blood received in culture bottles Performed at [facility], [address], [place], [place] [address]    Culture   Final    NO GROWTH 5 DAYS Performed  at [facility], [address] [place], [place] [address]    Report Status 02/24/2024 FINAL  Final  Culture, blood (Routine X 2) w Reflex to ID Panel     Status: None   Collection Time: 02/19/24  7:58 AM   Specimen: BLOOD RIGHT HAND  Result Value Ref Range Status   Specimen Description   Final    BLOOD RIGHT HAND Performed at [facility], [address], [place], [place] [address]    Special Requests   Final    BOTTLES DRAWN AEROBIC ONLY Blood Culture results may not be optimal due to an inadequate volume of blood received in culture bottles Performed at [facility], [address] [place], [place] [address]    Culture   Final    NO GROWTH 5 DAYS Performed at [facility], [address], [place], [place] [address]    Report Status 02/24/2024 FINAL  Final  MRSA Next Gen by PCR, Nasal     Status: None   Collection Time: 02/20/24  7:24 PM   Specimen: Nasal Mucosa; Nasal Swab  Result Value Ref Range Status   MRSA by PCR Next Gen NOT DETECTED NOT DETECTED Final    Comment: (NOTE) The GeneXpert MRSA Assay (FDA approved for NASAL specimens only), is one component of a comprehensive MRSA colonization surveillance program. It is not intended to diagnose MRSA infection nor to guide or monitor treatment for MRSA infections. Test performance is not FDA approved in patients less than 104 years old. Performed at [facility], [address] [place], [place] [address]      Scheduled Meds:  acetaminophen   1,000 mg Oral Q6H   amLODipine   5 mg Oral Daily   enoxaparin  (LOVENOX ) injection  40 mg Subcutaneous Q24H   feeding supplement  1 Container Oral TID BM   hydrochlorothiazide   25 mg Oral Daily   ipratropium-albuterol   3 mL Nebulization BID   metoprolol  succinate  50 mg Oral Daily   polyethylene glycol  17 g Oral Daily   senna-docusate  1 tablet Oral QHS   Continuous Infusions:  piperacillin -tazobactam (ZOSYN )  IV 3.375 g  (02/25/24 0359)     Procedures/Studies: CT ABDOMEN PELVIS W CONTRAST Result Date: 02/25/2024 EXAM: CT ABDOMEN AND PELVIS WITH CONTRAST 02/24/2024 02:45:34 PM TECHNIQUE: CT of the abdomen and pelvis was performed with the administration of  100 mL of iohexol  (OMNIPAQUE ) 300 MG/ML solution. Multiplanar reformatted images are provided for review. Automated exposure control, iterative reconstruction, and/or weight-based adjustment of the mA/kV was utilized to reduce the radiation dose to as low as reasonably achievable. COMPARISON: CT abdomen and pelvis from 02/20/2024 and CT angio chest 02/16/2024. CLINICAL HISTORY: Abdominal pain, post-op. FINDINGS: LOWER CHEST: Aortic atherosclerosis and coronary artery calcifications. Trace anterior inferior pericardial fluid. Small bilateral pleural effusions are new from the previous exam. Atelectasis is identified within the posterior basal and lateral basal right lower lobe and posterior left base. Emphysema with diffuse bronchial wall thickening. The central airways are patent. Remote healed right posterior rib fractures. Thoracic degenerative disc disease. LIVER: There is a surgical drainage catheter which entered from a right lateral abdominal wall approach and courses through the gallbladder fossa and terminates along the falciform ligament. Previous loculated fluid collection containing gas has decreased in volume from the prior exam. Currently, this measures 4.2 x 2.6 x 2.1 cm, axial 65/3. On the previous examination, using the same measurement scheme as today, this fluid collection measured 9.3 x 3.0 x 4.9 cm. Similar appearance of inflammatory changes including soft tissue stranding and free fluid within the right upper quadrant of the abdomen. GALLBLADDER AND BILE DUCTS: Status post cholecystectomy. Mild pneumobilia. Interval placement of a common bile duct stent. No significant bile duct dilatation. SPLEEN: No acute abnormality. PANCREAS: No acute abnormality.  ADRENAL GLANDS: No acute abnormality. KIDNEYS, URETERS AND BLADDER: 2.3 cm Bosniak class 1 cyst arises off the lateral left kidney. Per consensus, no follow-up is needed for simple Bosniak type 1 and 2 renal cysts, unless the patient has a malignancy history or risk factors. No stones in the kidneys or ureters. No hydronephrosis. No perinephric or periureteral stranding. Urinary bladder is unremarkable. GI AND BOWEL: Stomach demonstrates no acute abnormality. Persistent increased caliber of small bowel loops with multiple air-fluid levels, which measure up to 3.2 cm. Distal small bowel loops are decreased in caliber, and there is a normal caliber colon. Sigmoid diverticulosis is identified. Interval development of mild wall thickening with surrounding hyperemia of the vasorectal displacement of the sigmoid mesenteric fat suggestive of diverticulitis versus colitis. This is seen on axial image 104/3. There is no bowel obstruction. PERITONEUM AND RETROPERITONEUM: A maturing fluid collection within the pelvis with signs of early neural enhancement/loculation measuring 6.4 x 5.6 x 3.5 cm. No ascites. No free air. VASCULATURE: Aortic atherosclerotic calcification. Aorta is normal in caliber. LYMPH NODES: No abdominal or pelvic adenopathy. REPRODUCTIVE ORGANS: No acute abnormality. BONES AND SOFT TISSUES: Lumbar spondylosis. No acute osseous abnormality. No focal soft tissue abnormality. IMPRESSION: 1. Decreased volume of the previous loculated fluid collection containing gas in the right upper quadrant, now measuring 4.2 x 2.6 x 2.1 cm. 2. Maturing fluid collection within the pelvis with early rim enhancement/loculation, measuring 6.4 x 5.6 x 3.5 cm. 3. Persistent increased caliber of small bowel loops with multiple air-fluid levels, measuring up to 3.2 cm. Differential considerations include postoperative ileus versus distal bowel obstruction. 4. Interval development of mild sigmoid wall thickening with surrounding  hyperemia and mesenteric fat stranding, suggestive of diverticulitis versus colitis. 5. Status post cholecystectomy with interval placement of a common bile duct stent, without significant bile duct dilatation and with mild pneumobilia. Electronically signed by: Waddell Calk MD 02/25/2024 06:58 AM EST RP Workstation: [id number]   CT CHEST W CONTRAST Result Date: 02/25/2024 EXAM: CT ABDOMEN AND PELVIS WITH CONTRAST 02/24/2024 02:45:34 PM TECHNIQUE: CT of the abdomen and  pelvis was performed with the administration of 100 mL of iohexol  (OMNIPAQUE ) 300 MG/ML solution. Multiplanar reformatted images are provided for review. Automated exposure control, iterative reconstruction, and/or weight-based adjustment of the mA/kV was utilized to reduce the radiation dose to as low as reasonably achievable. COMPARISON: CT abdomen and pelvis from 02/20/2024 and CT angio chest 02/16/2024. CLINICAL HISTORY: Abdominal pain, post-op. FINDINGS: LOWER CHEST: Aortic atherosclerosis and coronary artery calcifications. Trace anterior inferior pericardial fluid. Small bilateral pleural effusions are new from the previous exam. Atelectasis is identified within the posterior basal and lateral basal right lower lobe and posterior left base. Emphysema with diffuse bronchial wall thickening. The central airways are patent. Remote healed right posterior rib fractures. Thoracic degenerative disc disease. LIVER: There is a surgical drainage catheter which entered from a right lateral abdominal wall approach and courses through the gallbladder fossa and terminates along the falciform ligament. Previous loculated fluid collection containing gas has decreased in volume from the prior exam. Currently, this measures 4.2 x 2.6 x 2.1 cm, axial 65/3. On the previous examination, using the same measurement scheme as today, this fluid collection measured 9.3 x 3.0 x 4.9 cm. Similar appearance of inflammatory changes including soft tissue stranding and  free fluid within the right upper quadrant of the abdomen. GALLBLADDER AND BILE DUCTS: Status post cholecystectomy. Mild pneumobilia. Interval placement of a common bile duct stent. No significant bile duct dilatation. SPLEEN: No acute abnormality. PANCREAS: No acute abnormality. ADRENAL GLANDS: No acute abnormality. KIDNEYS, URETERS AND BLADDER: 2.3 cm Bosniak class 1 cyst arises off the lateral left kidney. Per consensus, no follow-up is needed for simple Bosniak type 1 and 2 renal cysts, unless the patient has a malignancy history or risk factors. No stones in the kidneys or ureters. No hydronephrosis. No perinephric or periureteral stranding. Urinary bladder is unremarkable. GI AND BOWEL: Stomach demonstrates no acute abnormality. Persistent increased caliber of small bowel loops with multiple air-fluid levels, which measure up to 3.2 cm. Distal small bowel loops are decreased in caliber, and there is a normal caliber colon. Sigmoid diverticulosis is identified. Interval development of mild wall thickening with surrounding hyperemia of the vasorectal displacement of the sigmoid mesenteric fat suggestive of diverticulitis versus colitis. This is seen on axial image 104/3. There is no bowel obstruction. PERITONEUM AND RETROPERITONEUM: A maturing fluid collection within the pelvis with signs of early neural enhancement/loculation measuring 6.4 x 5.6 x 3.5 cm. No ascites. No free air. VASCULATURE: Aortic atherosclerotic calcification. Aorta is normal in caliber. LYMPH NODES: No abdominal or pelvic adenopathy. REPRODUCTIVE ORGANS: No acute abnormality. BONES AND SOFT TISSUES: Lumbar spondylosis. No acute osseous abnormality. No focal soft tissue abnormality. IMPRESSION: 1. Decreased volume of the previous loculated fluid collection containing gas in the right upper quadrant, now measuring 4.2 x 2.6 x 2.1 cm. 2. Maturing fluid collection within the pelvis with early rim enhancement/loculation, measuring 6.4 x 5.6 x  3.5 cm. 3. Persistent increased caliber of small bowel loops with multiple air-fluid levels, measuring up to 3.2 cm. Differential considerations include postoperative ileus versus distal bowel obstruction. 4. Interval development of mild sigmoid wall thickening with surrounding hyperemia and mesenteric fat stranding, suggestive of diverticulitis versus colitis. 5. Status post cholecystectomy with interval placement of a common bile duct stent, without significant bile duct dilatation and with mild pneumobilia. Electronically signed by: Waddell Calk MD 02/25/2024 06:58 AM EST RP Workstation: [id number]   DG CHEST PORT 1 VIEW Result Date: 02/22/2024 CLINICAL DATA:  Shortness of breath. EXAM: PORTABLE  CHEST 1 VIEW COMPARISON:  02/18/2024 FINDINGS: Normal heart size with stable mediastinal contours. Slight increase in atelectasis at the lung bases. No pulmonary edema, large pleural effusion or pneumothorax. Reverse left shoulder arthroplasty. IMPRESSION: Slight increase in atelectasis at the lung bases. Electronically Signed   By: Andrea Gasman M.D.   On: 02/22/2024 10:51   CT ABDOMEN PELVIS WO CONTRAST Result Date: 02/20/2024 EXAM: CT ABDOMEN AND PELVIS WITHOUT CONTRAST 02/20/2024 01:22:00 PM TECHNIQUE: CT of the abdomen and pelvis was performed without the administration of intravenous contrast. Multiplanar reformatted images are provided for review. Automated exposure control, iterative reconstruction, and/or weight-based adjustment of the mA/kV was utilized to reduce the radiation dose to as low as reasonably achievable. COMPARISON: HIDA scan dated 02/20/2024. CLINICAL HISTORY: Abdominal pain, post-op.Postoperative cholecystectomy day 3. FINDINGS: LOWER CHEST: Mild bibasilar atelectasis. LIVER: The liver is unremarkable. GALLBLADDER AND BILE DUCTS: Collection in the gallbladder fossa measures 6.1 x 4.8 cm. The collection is low density. Percutaneous drain extends through the fluid collection.  Cholecystectomy clips in the region of the cystic duct. There is no biliary ductal dilatation. The common bile duct is normal caliber. SPLEEN: No acute abnormality. PANCREAS: No evidence of pancreatitis on noncontrast exam. ADRENAL GLANDS: No acute abnormality. KIDNEYS, URETERS AND BLADDER: No stones in the kidneys or ureters. No hydronephrosis. No perinephric or periureteral stranding. Urinary bladder is unremarkable. GI AND BOWEL: Stomach is normal. Duodenum appears normal. The bowel is fluid filled with air-fluid levels suggesting postoperative ileus. No pneumatosis. Stool in the rectum. PERITONEUM AND RETROPERITONEUM: Small amount of scattered gas in the upper peritoneal space related to recent laparoscopic surgery. Small amount of high density fluid collects dependently within the posterior cul de sac with a fluid-fluid level. Small volume hemoperitoneum presumably related to recent surgery. VASCULATURE: Aorta is normal in caliber. LYMPH NODES: No lymphadenopathy. REPRODUCTIVE ORGANS: No acute abnormality. BONES AND SOFT TISSUES: No acute osseous abnormality. No focal soft tissue abnormality. IMPRESSION: 1. Collection in the gallbladder fossa measuring 6.1 x 4.8 cm with percutaneous drain in place. Findings consistent with predominantly contained bowel gas. 2. Postoperative ileus. 3. Small volume hemoperitoneum complex in the posterior cul de sac , presumably related to recent surgery. 4. These results will be called to the ordering clinician or representative by the radiologist assistant, and communication documented in the pacs or clario dashboard Electronically signed by: Norleen Boxer MD 02/20/2024 03:59 PM EST RP Workstation: [id number]   DG Abd Portable 1V Result Date: 02/20/2024 CLINICAL DATA:  Abdominal distension. EXAM: PORTABLE ABDOMEN - 1 VIEW COMPARISON:  02/18/2024 FINDINGS: Increasing gaseous distention of bowel loops in the central abdomen, primarily colonic distension, however prominent  air-filled loops of small bowel are also seen. Drain in the right mid abdomen. Right upper quadrant surgical clips. IMPRESSION: Increasing gaseous distention of bowel loops in the central abdomen, primarily colonic distension, however prominent air-filled loops of small bowel are also seen. Favor generalized ileus. Electronically Signed   By: Andrea Gasman M.D.   On: 02/20/2024 14:05   NM HEPATOBILIARY LEAK (POST-SURGICAL) Result Date: 02/20/2024 EXAM: NM HEPATOBILLARY SCAN 02/20/2024 12:50:37 PM TECHNIQUE: RADIOPHARMACEUTICAL: 5.4 mCi Tc-41m mebrofenin  Dynamic images of the abdomen and pelvis were obtained in the anterior projection for 1 hour after intravenous administration of radiopharmaceutical. The biliary drain was clamped for the second hour of the exam and imaging continued for at least 30 minutes. COMPARISON: None available. CLINICAL HISTORY: Abdominal pain, post surgery or trauma, bile leak suspected. FINDINGS: Homogenous uptake within the liver. Normal clearance  of the blood pool. Initial imaging demonstrates excreted radiotracer in the common hepatic duct with evidence of radiotracer within the biliary drain. The drain was clamped for the second hour of the exam. With the drain clamped, extra biliary radiotracer collects within the gallbladder fossa. Findings consistent with contained bowel leak within the gallbladder fossa. Radiotracer evacuating through the biliary drain when unclamped. No activity in the small bowel. IMPRESSION: 1. Radiotracer evacuates through the biliary drain when unclamped. 2. With drain clamped, extra biliary radiotracer collects within the gallbladder fossa. 3. Findings consistent with biliary leak . No activity in the small bowel suggests brisk leak. 4. These results will be called to the ordering clinician or representative by the radiologist assistant, and communication documented in the pacs or clario dashboard Electronically signed by: Norleen Boxer MD 02/20/2024  01:11 PM EST RP Workstation: [id number]   MR ABDOMEN MRCP WO CONTRAST Result Date: 02/19/2024 CLINICAL DATA:  Postop day 1 from laparoscopic cholecystectomy. Elevated LFTs. Clinical concern for retained common bile duct stone. EXAM: MRI ABDOMEN WITHOUT CONTRAST  (INCLUDING MRCP) TECHNIQUE: Multiplanar multisequence MR imaging of the abdomen was performed. Heavily T2-weighted images of the biliary and pancreatic ducts were obtained, and three-dimensional MRCP images were rendered by post processing. COMPARISON:  Abdomen and pelvis CT 02/16/2024 FINDINGS: Lower chest: Dependent atelectasis with tiny right pleural effusion. Hepatobiliary: No suspicious focal abnormality in the liver on this noncontrast study. There is some periportal edema with edema in the inferior liver around the gallbladder fossa. Heterogeneous signal intensity in the gallbladder fossa likely reflects a combination of postoperative fluid/blood products in packing material. No substantial intrahepatic biliary duct dilatation. Common duct measures 7 mm diameter in the porta hepatis. Common bile duct just proximal to the ampulla is 7 mm diameter. There is no evidence for choledocholithiasis. Pancreas: No focal mass lesion. No dilatation of the main duct. No intraparenchymal cyst. No peripancreatic edema. Edema around the duodenum and pancreatic head is 2 compatible with recent surgery. Spleen:  No splenomegaly. No suspicious focal mass lesion. Adrenals/Urinary Tract: No adrenal nodule or mass. Bilateral renal cysts evident. Stomach/Bowel: Stomach is moderately distended with fluid. Small bowel and colon shows mild diffuse gaseous distension. Vascular/Lymphatic: No abdominal aortic aneurysm small retroperitoneal lymph nodes are similar to recent CT scan. Other: Edema in the anterior abdominal wall is compatible with the recent surgical access. There is dependent subcutaneous body wall edema bilaterally bilaterally. Musculoskeletal: No overtly  suspicious marrow signal abnormality. IMPRESSION: 1. Postop day 1 from cholecystectomy. Heterogeneous signal intensity in the gallbladder fossa likely reflects a combination of postoperative fluid/blood products and packing material. 2. No substantial intrahepatic biliary duct dilatation. Common duct measures 7 mm diameter in the porta hepatis and just proximal to the ampulla. No evidence for choledocholithiasis. 3. Mild diffuse gaseous distension of the small bowel and colon compatible with mild postoperative ileus. 4. Dependent atelectasis with tiny right pleural effusion. Electronically Signed   By: Camellia Candle M.D.   On: 02/19/2024 05:32   DG Abd Portable 1V Result Date: 02/18/2024 CLINICAL DATA:  Distension postop cholecystostomy EXAM: PORTABLE ABDOMEN - 1 VIEW COMPARISON:  CT 02/16/2024 FINDINGS: Postop changes in the right upper quadrant with clips and probable drainage catheter. Mild increased small and large bowel gas without obstruction. Moderate stool IMPRESSION: Mild increased small and large bowel gas without obstruction, possible mild ileus. Moderate stool. Electronically Signed   By: Luke Bun M.D.   On: 02/18/2024 15:59   DG Chest Port 1 View Result Date: 02/18/2024  CLINICAL DATA:  Shortness of breath EXAM: PORTABLE CHEST 1 VIEW COMPARISON:  02/16/2024 FINDINGS: Hypoventilatory change. Minimal atelectasis right base. No consolidation, pleural effusion or pneumothorax. Normal cardiac size. Aortic atherosclerosis. Left shoulder replacement IMPRESSION: Hypoventilatory changes with minimal atelectasis at the right base. Electronically Signed   By: Luke Bun M.D.   On: 02/18/2024 15:58   CT ABDOMEN PELVIS W CONTRAST Result Date: 02/16/2024 EXAM: CT ABDOMEN AND PELVIS WITH CONTRAST 02/16/2024 07:57:25 PM TECHNIQUE: CT of the abdomen and pelvis was performed with the administration of 100 mL of iohexol  (OMNIPAQUE ) 350 MG/ML injection. Multiplanar reformatted images are provided for  review. Automated exposure control, iterative reconstruction, and/or weight-based adjustment of the mA/kV was utilized to reduce the radiation dose to as low as reasonably achievable. COMPARISON: None available. CLINICAL HISTORY: Epigastric pain; cp, epigastric abd pain, leukocytosis. FINDINGS: LOWER CHEST: No acute abnormality. LIVER: Hepatic steatosis. GALLBLADDER AND BILE DUCTS: Distended gallbladder with wall thickening and pericholecystic fluid compatible with acute cholecystitis. No radiopaque stone or biliary ductal dilatation. SPLEEN: No acute abnormality. PANCREAS: No acute abnormality. ADRENAL GLANDS: No acute abnormality. KIDNEYS, URETERS AND BLADDER: No stones in the kidneys or ureters. No hydronephrosis. No perinephric or periureteral stranding. Urinary bladder is unremarkable. GI AND BOWEL: Stomach demonstrates no acute abnormality. Colonic diverticulosis without evidence of diverticulitis. There is no bowel obstruction. PERITONEUM AND RETROPERITONEUM: No ascites. No free air. VASCULATURE: Aorta is normal in caliber. Aortic atherosclerotic calcification. LYMPH NODES: No lymphadenopathy. REPRODUCTIVE ORGANS: No acute abnormality. BONES AND SOFT TISSUES: No acute osseous abnormality. No focal soft tissue abnormality. IMPRESSION: 1. Acute cholecystitis. Electronically signed by: Norman Gatlin MD 02/16/2024 08:10 PM EST RP Workstation: [id number]   CT Angio Chest PE W and/or Wo Contrast Result Date: 02/16/2024 EXAM: CTA CHEST 02/16/2024 07:57:25 PM TECHNIQUE: CTA of the chest was performed with the administration of 100 mL of iohexol  (OMNIPAQUE ) 350 MG/ML injection. Multiplanar reformatted images are provided for review. MIP images are provided for review. Automated exposure control, iterative reconstruction, and/or weight based adjustment of the mA/kV was utilized to reduce the radiation dose to as low as reasonably achievable. COMPARISON: Comparison with the same day x-ray. CLINICAL HISTORY:  Pulmonary embolism (PE) suspected, high prob; cough, cp, abd pain. FINDINGS: PULMONARY ARTERIES: Pulmonary arteries are adequately opacified for evaluation. No acute pulmonary embolus. Main pulmonary artery is normal in caliber. MEDIASTINUM: Coronary artery and aortic atherosclerotic calcification. The heart and pericardium demonstrate no acute abnormality. There is no acute abnormality of the thoracic aorta. LYMPH NODES: No mediastinal, hilar or axillary lymphadenopathy. LUNGS AND PLEURA: Emphysema. Right lower lobe atelectasis. No evidence of pleural effusion or pneumothorax. UPPER ABDOMEN: Hepatic steatosis. SOFT TISSUES AND BONES: No acute bone or soft tissue abnormality. IMPRESSION: 1. No pulmonary embolism. 2. Emphysema. Pulmonary emphysema is an independent risk factor for lung cancer. Recommend consideration for evaluation for a low-dose CT lung cancer screening program. Electronically signed by: Norman Gatlin MD 02/16/2024 08:07 PM EST RP Workstation: [id number]   DG Chest Portable 1 View Result Date: 02/16/2024 CLINICAL DATA:  Cough. EXAM: PORTABLE CHEST 1 VIEW COMPARISON:  05/11/2021. FINDINGS: The heart size and mediastinal contours are within normal limits. There is elevation of the right diaphragm. Atelectasis is noted at the lung bases bilaterally. No effusion or pneumothorax is seen. 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 IMPRESSION: Elevation of the right diaphragm with atelectasis at the lung bases. Electronically Signed   By: Leita Birmingham M.D.   On: 02/16/2024 17:48    Isaiah Lever,  ANP  [facility]  If 7PM-7AM, please contact night-coverage [email or web address] 02/25/2024, 8:12 AM   LOS: 9 days   "

## 2024-02-25 NOTE — Procedures (Signed)
 Interventional Radiology Procedure:   Indications: Post operative pelvic fluid collection  Procedure: CT guided pelvic drain placement  Findings: 10 Fr drain placed in pelvic collection from a left transgluteal approach.  Aspirated 15 ml of bloody fluid.    Complications: None     EBL: Minimal  Plan:  Fluid sent for culture.    Nolan Lasser R. Philip, MD  Pager: [phone]

## 2024-02-25 NOTE — Care Management Important Message (Signed)
 Important Message  Patient Details IM Letter given. Name: Nathan Stewart MRN: [id number] Date of Birth: July 28, 1955   Important Message Given:  Yes - Medicare IM     Shep Porter 02/25/2024, 12:55 PM

## 2024-02-25 NOTE — Progress Notes (Signed)
 4 Days Post-Op   Subjective/Chief Complaint: No change, tol diet, oob   Objective: Vital signs in last 24 hours: Temp:  [98.6 F (37 C)-99.6 F (37.6 C)] 99.6 F (37.6 C) (12/31 0614) Pulse Rate:  [87-89] 89 (12/31 0614) Resp:  [17-18] 17 (12/31 0614) BP: (135-143)/(60-78) 143/78 (12/31 0614) SpO2:  [95 %-97 %] 95 % (12/31 0622) Last BM Date : 02/24/24  Intake/Output from previous day: 12/30 0701 - 12/31 0700 In: 543.8 [P.O.:360; IV Piggyback:183.8] Out: 20 [Drains:20] Intake/Output this shift: No intake/output data recorded.  Ab soft nontender incisions clean jp mostly serous  Lab Results:  Recent Labs    02/24/24 0350 02/25/24 0355  WBC 23.5* 20.7*  HGB 11.2* 10.6*  HCT 33.9* 32.2*  PLT 489* 495*   BMET Recent Labs    02/24/24 0350 02/25/24 0355  NA 135 134*  K 3.5 3.1*  CL 101 99  CO2 22 23  GLUCOSE 134* 144*  BUN 9 9  CREATININE 0.71 0.70  CALCIUM 9.3 9.3   PT/INR No results for input(s): LABPROT, INR in the last 72 hours. ABG No results for input(s): PHART, HCO3 in the last 72 hours.  Invalid input(s): PCO2, PO2  Studies/Results: CT ABDOMEN PELVIS W CONTRAST Result Date: 02/25/2024 EXAM: CT ABDOMEN AND PELVIS WITH CONTRAST 02/24/2024 02:45:34 PM TECHNIQUE: CT of the abdomen and pelvis was performed with the administration of 100 mL of iohexol  (OMNIPAQUE ) 300 MG/ML solution. Multiplanar reformatted images are provided for review. Automated exposure control, iterative reconstruction, and/or weight-based adjustment of the mA/kV was utilized to reduce the radiation dose to as low as reasonably achievable. COMPARISON: CT abdomen and pelvis from 02/20/2024 and CT angio chest 02/16/2024. CLINICAL HISTORY: Abdominal pain, post-op. FINDINGS: LOWER CHEST: Aortic atherosclerosis and coronary artery calcifications. Trace anterior inferior pericardial fluid. Small bilateral pleural effusions are new from the previous exam. Atelectasis is identified  within the posterior basal and lateral basal right lower lobe and posterior left base. Emphysema with diffuse bronchial wall thickening. The central airways are patent. Remote healed right posterior rib fractures. Thoracic degenerative disc disease. LIVER: There is a surgical drainage catheter which entered from a right lateral abdominal wall approach and courses through the gallbladder fossa and terminates along the falciform ligament. Previous loculated fluid collection containing gas has decreased in volume from the prior exam. Currently, this measures 4.2 x 2.6 x 2.1 cm, axial 65/3. On the previous examination, using the same measurement scheme as today, this fluid collection measured 9.3 x 3.0 x 4.9 cm. Similar appearance of inflammatory changes including soft tissue stranding and free fluid within the right upper quadrant of the abdomen. GALLBLADDER AND BILE DUCTS: Status post cholecystectomy. Mild pneumobilia. Interval placement of a common bile duct stent. No significant bile duct dilatation. SPLEEN: No acute abnormality. PANCREAS: No acute abnormality. ADRENAL GLANDS: No acute abnormality. KIDNEYS, URETERS AND BLADDER: 2.3 cm Bosniak class 1 cyst arises off the lateral left kidney. Per consensus, no follow-up is needed for simple Bosniak type 1 and 2 renal cysts, unless the patient has a malignancy history or risk factors. No stones in the kidneys or ureters. No hydronephrosis. No perinephric or periureteral stranding. Urinary bladder is unremarkable. GI AND BOWEL: Stomach demonstrates no acute abnormality. Persistent increased caliber of small bowel loops with multiple air-fluid levels, which measure up to 3.2 cm. Distal small bowel loops are decreased in caliber, and there is a normal caliber colon. Sigmoid diverticulosis is identified. Interval development of mild wall thickening with surrounding hyperemia of  the vasorectal displacement of the sigmoid mesenteric fat suggestive of diverticulitis versus  colitis. This is seen on axial image 104/3. There is no bowel obstruction. PERITONEUM AND RETROPERITONEUM: A maturing fluid collection within the pelvis with signs of early neural enhancement/loculation measuring 6.4 x 5.6 x 3.5 cm. No ascites. No free air. VASCULATURE: Aortic atherosclerotic calcification. Aorta is normal in caliber. LYMPH NODES: No abdominal or pelvic adenopathy. REPRODUCTIVE ORGANS: No acute abnormality. BONES AND SOFT TISSUES: Lumbar spondylosis. No acute osseous abnormality. No focal soft tissue abnormality. IMPRESSION: 1. Decreased volume of the previous loculated fluid collection containing gas in the right upper quadrant, now measuring 4.2 x 2.6 x 2.1 cm. 2. Maturing fluid collection within the pelvis with early rim enhancement/loculation, measuring 6.4 x 5.6 x 3.5 cm. 3. Persistent increased caliber of small bowel loops with multiple air-fluid levels, measuring up to 3.2 cm. Differential considerations include postoperative ileus versus distal bowel obstruction. 4. Interval development of mild sigmoid wall thickening with surrounding hyperemia and mesenteric fat stranding, suggestive of diverticulitis versus colitis. 5. Status post cholecystectomy with interval placement of a common bile duct stent, without significant bile duct dilatation and with mild pneumobilia. Electronically signed by: Waddell Calk MD 02/25/2024 06:58 AM EST RP Workstation: [id number]   CT CHEST W CONTRAST Result Date: 02/25/2024 EXAM: CT ABDOMEN AND PELVIS WITH CONTRAST 02/24/2024 02:45:34 PM TECHNIQUE: CT of the abdomen and pelvis was performed with the administration of 100 mL of iohexol  (OMNIPAQUE ) 300 MG/ML solution. Multiplanar reformatted images are provided for review. Automated exposure control, iterative reconstruction, and/or weight-based adjustment of the mA/kV was utilized to reduce the radiation dose to as low as reasonably achievable. COMPARISON: CT abdomen and pelvis from 02/20/2024 and CT angio  chest 02/16/2024. CLINICAL HISTORY: Abdominal pain, post-op. FINDINGS: LOWER CHEST: Aortic atherosclerosis and coronary artery calcifications. Trace anterior inferior pericardial fluid. Small bilateral pleural effusions are new from the previous exam. Atelectasis is identified within the posterior basal and lateral basal right lower lobe and posterior left base. Emphysema with diffuse bronchial wall thickening. The central airways are patent. Remote healed right posterior rib fractures. Thoracic degenerative disc disease. LIVER: There is a surgical drainage catheter which entered from a right lateral abdominal wall approach and courses through the gallbladder fossa and terminates along the falciform ligament. Previous loculated fluid collection containing gas has decreased in volume from the prior exam. Currently, this measures 4.2 x 2.6 x 2.1 cm, axial 65/3. On the previous examination, using the same measurement scheme as today, this fluid collection measured 9.3 x 3.0 x 4.9 cm. Similar appearance of inflammatory changes including soft tissue stranding and free fluid within the right upper quadrant of the abdomen. GALLBLADDER AND BILE DUCTS: Status post cholecystectomy. Mild pneumobilia. Interval placement of a common bile duct stent. No significant bile duct dilatation. SPLEEN: No acute abnormality. PANCREAS: No acute abnormality. ADRENAL GLANDS: No acute abnormality. KIDNEYS, URETERS AND BLADDER: 2.3 cm Bosniak class 1 cyst arises off the lateral left kidney. Per consensus, no follow-up is needed for simple Bosniak type 1 and 2 renal cysts, unless the patient has a malignancy history or risk factors. No stones in the kidneys or ureters. No hydronephrosis. No perinephric or periureteral stranding. Urinary bladder is unremarkable. GI AND BOWEL: Stomach demonstrates no acute abnormality. Persistent increased caliber of small bowel loops with multiple air-fluid levels, which measure up to 3.2 cm. Distal small bowel  loops are decreased in caliber, and there is a normal caliber colon. Sigmoid diverticulosis is identified. Interval development  of mild wall thickening with surrounding hyperemia of the vasorectal displacement of the sigmoid mesenteric fat suggestive of diverticulitis versus colitis. This is seen on axial image 104/3. There is no bowel obstruction. PERITONEUM AND RETROPERITONEUM: A maturing fluid collection within the pelvis with signs of early neural enhancement/loculation measuring 6.4 x 5.6 x 3.5 cm. No ascites. No free air. VASCULATURE: Aortic atherosclerotic calcification. Aorta is normal in caliber. LYMPH NODES: No abdominal or pelvic adenopathy. REPRODUCTIVE ORGANS: No acute abnormality. BONES AND SOFT TISSUES: Lumbar spondylosis. No acute osseous abnormality. No focal soft tissue abnormality. IMPRESSION: 1. Decreased volume of the previous loculated fluid collection containing gas in the right upper quadrant, now measuring 4.2 x 2.6 x 2.1 cm. 2. Maturing fluid collection within the pelvis with early rim enhancement/loculation, measuring 6.4 x 5.6 x 3.5 cm. 3. Persistent increased caliber of small bowel loops with multiple air-fluid levels, measuring up to 3.2 cm. Differential considerations include postoperative ileus versus distal bowel obstruction. 4. Interval development of mild sigmoid wall thickening with surrounding hyperemia and mesenteric fat stranding, suggestive of diverticulitis versus colitis. 5. Status post cholecystectomy with interval placement of a common bile duct stent, without significant bile duct dilatation and with mild pneumobilia. Electronically signed by: Waddell Calk MD 02/25/2024 06:58 AM EST RP Workstation: [id number]    Anti-infectives: Anti-infectives (From admission, onward)    Start     Dose/Rate Route Frequency Ordered Stop   02/23/24 2000  piperacillin -tazobactam (ZOSYN ) IVPB 3.375 g        3.375 g 12.5 mL/hr over 240 Minutes Intravenous Every 8 hours 02/23/24 1848      02/18/24 2000  piperacillin -tazobactam (ZOSYN ) IVPB 3.375 g        3.375 g 12.5 mL/hr over 240 Minutes Intravenous Every 8 hours 02/18/24 1400 02/23/24 1646   02/17/24 2200  piperacillin -tazobactam (ZOSYN ) IVPB 3.375 g        3.375 g 12.5 mL/hr over 240 Minutes Intravenous Every 8 hours 02/17/24 1708 02/18/24 1652   02/17/24 0600  piperacillin -tazobactam (ZOSYN ) IVPB 3.375 g  Status:  Discontinued        3.375 g 12.5 mL/hr over 240 Minutes Intravenous Every 8 hours 02/17/24 0025 02/17/24 1708   02/16/24 2145  piperacillin -tazobactam (ZOSYN ) IVPB 3.375 g        3.375 g 100 mL/hr over 30 Minutes Intravenous  Once 02/16/24 2137 02/16/24 2232       Assessment/Plan: POD 8 lap chole- EW PPD 4 ERCP for leak -continue HHD-have made NPO for now pending IR eval -monitor JP drainage -continue abx for now -ct with residual collection gb fossa and in pelvis, wbc 20, will have IR see today -lovenox    Nathan Stewart 02/25/2024

## 2024-02-25 NOTE — Plan of Care (Signed)
" °  Problem: Pain Managment: Goal: General experience of comfort will improve and/or be controlled Outcome: Progressing   Problem: Safety: Goal: Ability to remain free from injury will improve Outcome: Progressing   Problem: Coping: Goal: Level of anxiety will decrease Outcome: Progressing   Problem: Elimination: Goal: Will not experience complications related to bowel motility Outcome: Progressing Goal: Will not experience complications related to urinary retention Outcome: Progressing   "

## 2024-02-25 NOTE — Consult Note (Signed)
 "   Chief Complaint: post operative pelvic fluid collection, lap chole on 12/23  Referring Provider(s): Ebbie Cough, MD  Supervising Physician: Philip Cornet  Patient Status: [facility] - In-pt  History of Present Illness: Nathan Stewart is a 68 y.o. male with past medical history significant for HTN, GERD, MSSA infection (left shoulder), and sleep apnea (not on CPAP). He presented to the emergency department on 12/22 with abdominal pain, intermittent fevers at home, nausea, and decreased appetite. Lap chole was done on 12/23. He experienced worsening abdominal pain after surgery. MRCP done on 12/24 showed postoperative fluid/blood products and packing material. HIDA scan consistent with biliary leak. ERCP done on 12/27 showed bile leak; biliary sphincterotomy done, bile duct stone removed, and stent placed. CT abdomen pelvis with contrast on 12/30 revealed maturing fluid collection within the pelvis with early rim enhancement/loculation, measuring 6.4 x 5.6 x 3.5 cm. Received request for image guided drain placement. Case approved by Dr. Philip for image guided transgluteal drain placement.  Confirms NPO.  Denies fever, chills, shortness of breath, chest pain, sore throat, nausea, vomiting, diarrhea, abdominal pain, blood in stool or urine, abnormal bruising, leg swelling, back pain.  Reports mild right pelvic pain.  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   GEN OSTEOARTHROSIS INVOLVING MULTIPLE SITES 11/10/2007   GERD 01/01/2007   Hx of adenomatous polyp of colon 04/04/2016   Hypertension    INSOMNIA, CHRONIC 11/13/2009   LIVER FUNCTION TESTS, ABNORMAL 08/27/2007   LOC  OSTEOARTHROS NOT SPEC PRIM/SEC LOWER LEG 11/10/2007   Memory loss 08/27/2007   MENISCUS TEAR 10/30/2006   MSSA (methicillin susceptible Staphylococcus aureus) infection 04/29/2023   OSTEOARTHRITIS 06/07/2008   Pneumonia    Septic arthritis of shoulder, left (HCC) 09/16/2023   SLEEP APNEA 09/20/2009   no cpap   TESTICULAR HYPOFUNCTION 08/14/2009    Past Surgical History:  Procedure Laterality Date   BILIARY STENT PLACEMENT N/A 02/21/2024   Procedure: INSERTION, STENT, BILE DUCT;  Surgeon: Avram Lupita BRAVO, MD;  Location: [facility];  Service: Gastroenterology;  Laterality: N/A;   CHOLECYSTECTOMY N/A 02/17/2024   Procedure: LAPAROSCOPIC CHOLECYSTECTOMY;  Surgeon: Tanda Locus, MD;  Location: [facility];  Service: General;  Laterality: N/A;  ICG, possible IOC   COLONOSCOPY     COLONOSCOPY WITH PROPOFOL  N/A 05/09/2023   Procedure: COLONOSCOPY WITH PROPOFOL ;  Surgeon: Eartha Angelia Sieving, MD;  Location: [facility];  Service: Gastroenterology;  Laterality: N/A;  10:45AM;ASA 1   ERCP N/A 02/21/2024   Procedure: ERCP, WITH INTERVENTION IF INDICATED;  Surgeon: Avram Lupita BRAVO, MD;  Location: [facility];  Service: Gastroenterology;  Laterality: N/A;   HAND SURGERY  over 20 years ago   'metal plate in wrist'   IRRIGATION AND DEBRIDEMENT SHOULDER Left 04/03/2023   Procedure: IRRIGATION AND DEBRIDEMENT SHOULDER;  Surgeon: Dozier Soulier, MD;  Location: [facility];  Service: Orthopedics;  Laterality: Left;   JOINT REPLACEMENT N/A    Phreesia 10/19/2019   MULTIPLE TOOTH EXTRACTIONS     POLYPECTOMY  05/09/2023   Procedure: POLYPECTOMY;  Surgeon: Eartha Angelia Sieving, MD;  Location: [facility];  Service: Gastroenterology;;   SHOULDER SURGERY Right 2021   ligament damage   STONE EXTRACTION WITH BASKET  02/21/2024   Procedure: ERCP,  WITH LITHROTRIPSY OR REMOVAL OF COMMON BILE DUCT CALCULUS USING BASKET;  Surgeon: Avram Lupita BRAVO, MD;  Location: [facility];  Service: Gastroenterology;;    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   Procedure: TOTAL KNEE ARTHROPLASTY;  Surgeon: Dempsey Sensor, MD;  Location[facility];  Service: Orthopedics;  Laterality: Left;   TOTAL KNEE ARTHROPLASTY Right 05/21/2021   Procedure: RIGHT TOTAL KNEE ARTHROPLASTY;  Surgeon: Sensor Dempsey, MD;  Location: [facility];  Service: Orthopedics;  Laterality: Right;   TOTAL SHOULDER REVISION Left 03/11/2023   Procedure: LEFT SHOULDER REVISION GLENOID COMPONENT;  Surgeon: Dozier Soulier, MD;  Location: [facility];  Service: Orthopedics;  Laterality: Left;   TOTAL SHOULDER REVISION Left 04/03/2023   Procedure: SHOULDER REVISION;  Surgeon: Dozier Soulier, MD;  Location: [facility];  Service: Orthopedics;  Laterality: Left;      Medications: Prior to Admission medications  Medication Sig Start Date End Date Taking? Authorizing Provider  ALPRAZolam  (XANAX ) 0.5 MG tablet Take 1 tablet (0.5 mg total) by mouth 3 (three) times daily as needed for anxiety. 11/11/23  Yes [name], MD  amLODipine  (NORVASC ) 5 MG tablet TAKE 1 TABLET(5 MG) BY MOUTH DAILY 12/15/23  Yes [name], MD  cefadroxil  (DURICEF) 500 MG capsule Take 2 capsules (1,000 mg total) by mouth 2 (two) times daily. 04/30/23  Yes Fleeta Rothman, Jomarie SAILOR, MD  ELDERBERRY PO Take 1 tablet by mouth daily.   Yes [provider]  Garlic 1000 MG CAPS Take 1,000 mg by mouth daily.   Yes [provider]  hydrochlorothiazide  (HYDRODIURIL ) 25 MG tablet TAKE 1 TABLET(25 MG) BY MOUTH DAILY Patient taking differently: Take 25 mg by mouth daily. 09/18/23  Yes [name], MD  Magnesium 250 MG TABS Take 250 mg by mouth daily.   Yes [provider]  metoprolol  succinate (TOPROL -XL) 100 MG 24 hr tablet TAKE 1 TABLET BY MOUTH EVERY DAY WITH OR IMMEDIATELY FOLLOWING A MEAL 12/16/23  Yes [name], MD  Multiple Vitamin (MULTIVITAMIN) tablet Take 1 tablet by mouth daily. Adult 50+   Yes [provider]  Omega-3 Fatty Acids (FISH OIL)  1000 MG CPDR Take 2,000 mg by mouth daily.   Yes [provider]  vitamin C (ASCORBIC ACID) 250 MG tablet Take 250 mg by mouth daily.   Yes [provider]     Family History  Problem Relation Age of Onset   Arthritis Mother    Heart disease Mother    Hypertension Mother    Hypertension Father    Stroke Father    Hypertension Sister    Stroke Brother    Colon cancer Paternal Uncle     Social History   Socioeconomic History   Marital status: Married    Spouse name: Not on file   Number of children: Not on file   Years of education: Not on file   Highest education level: Not on file  Occupational History   Not on file  Tobacco Use   Smoking status: Former    Current packs/day: 0.00    Types: Cigarettes    Quit date: 02/25/1997    Years since quitting: 27.0    Passive exposure: Current   Smokeless tobacco: Never   Tobacco comments:    quit 1999  Vaping Use   Vaping status: Never Used  Substance and Sexual Activity   Alcohol use: Yes    Alcohol/week: 6.0 standard drinks of alcohol    Types: 6 Cans of beer per  week    Comment: occasional   Drug use: No   Sexual activity: Yes  Other Topics Concern   Not on file  Social History Narrative   Not on file   Social Drivers of Health   Tobacco Use: Medium Risk (02/21/2024)   Patient History    Smoking Tobacco Use: Former    Smokeless Tobacco Use: Never    Passive Exposure: Current  Physicist, Medical Strain: Low Risk (12/05/2023)   Received from [facility]   Overall Financial Resource Strain (CARDIA)    How hard is it for you to pay for the very basics like food, housing, medical care, and heating?: Not hard at all  Food Insecurity: No Food Insecurity (02/16/2024)   Epic    Worried About Radiation Protection Practitioner of Food in the Last Year: Never true    Ran Out of Food in the Last Year: Never true  Transportation Needs: No Transportation Needs (02/16/2024)   Epic    Lack of Transportation (Medical): No     Lack of Transportation (Non-Medical): No  Physical Activity: Sufficiently Active (12/05/2023)   [facility]   Exercise Vital Sign    On average, how many days per week do you engage in moderate to strenuous exercise (like a brisk walk)?: 3 days    On average, how many minutes do you engage in exercise at this level?: 60 min  Recent Concern: Physical Activity - Inactive (12/04/2023)   Exercise Vital Sign    Days of Exercise per Week: 0 days    Minutes of Exercise per Session: 0 min  Stress: No Stress Concern Present (12/05/2023)   Received from [facility] - Occupational Stress Questionnaire    Do you feel stress - tense, restless, nervous, or anxious, or unable to sleep at night because your mind is troubled all the time - these days?: Not at all  Social Connections: Socially Integrated (02/16/2024)   Social Connection and Isolation Panel    Frequency of Communication with Friends and Family: More than three times a week    Frequency of Social Gatherings with Friends and Family: Three times a week    Attends Religious Services: More than 4 times per year    Active Member of Clubs or Organizations: Yes    Attends Banker Meetings: More than 4 times per year    Marital Status: Married  Depression (PHQ2-9): Low Risk (01/27/2024)   Depression (PHQ2-9)    PHQ-2 Score: 0  Alcohol Screen: Low Risk (12/04/2023)   Alcohol Screen    Last Alcohol Screening Score (AUDIT): 0  Housing: Low Risk (02/16/2024)   Epic    Unable to Pay for Housing in the Last Year: No    Number of Times Moved in the Last Year: 0    Homeless in the Last Year: No  Utilities: Not At Risk (02/16/2024)   Epic    Threatened with loss of utilities: No  Health Literacy: Adequate Health Literacy (12/04/2023)   B1300 Health Literacy    Frequency of need for help with medical instructions: Never     Review of Systems: A 12 point ROS discussed and  pertinent positives are indicated in the HPI above.  All other systems are negative.    Vital Signs: BP (!) 143/78 (BP Location: Right Arm)   Pulse 89   Temp 99.6 F (37.6 C) (Oral)   Resp 17   Ht 6' 2 (1.88 m)  Wt 227 lb 1.2 oz (103 kg)   SpO2 95%   BMI 29.15 kg/m   Advance Care Plan: no documents on file   Physical Exam HENT:     Mouth/Throat:     Mouth: Mucous membranes are moist.  Cardiovascular:     Rate and Rhythm: Normal rate and regular rhythm.  Pulmonary:     Effort: Pulmonary effort is normal. No respiratory distress.     Breath sounds: Normal breath sounds.  Abdominal:     General: There is distension.     Palpations: Abdomen is soft.     Comments: Lap surgical sites on abdomen with steri strips in place RUQ JP drain with serous drainage in bulb; site unremarkable; suture intact  Neurological:     Mental Status: He is alert and oriented to person, place, and time.  Psychiatric:        Mood and Affect: Mood normal.        Behavior: Behavior normal.     Imaging: DG ERCP Result Date: 02/25/2024 CLINICAL DATA:  History of bile leak EXAM: ERCP 13 fluoroscopic images of the right upper quadrant and 4 cine loops TECHNIQUE: Multiple spot images obtained with the fluoroscopic device and submitted for interpretation post-procedure. FLUOROSCOPY: Radiation Exposure Index (as provided by the fluoroscopic device): 64.8 mGy Kerma COMPARISON:  None Available. FINDINGS: Images demonstrate flexible endoscopy device with guidewire advanced into the common bile duct. Surgical clips in the right upper quadrant consistent with previous cholecystectomy. Mild-to-moderate dilatation of the common bile duct after contrast injection. Contrast can be seen flowing into the cystic duct and then extravasated into the gallbladder fossa. Interval placement of plastic common bile duct stent. IMPRESSION: Bile duct leak status post cholecystectomy. ERCP with interval placement of plastic common  bile duct stent. These images were submitted for radiologic interpretation only. Please see the procedural report for the amount of contrast and the fluoroscopy time utilized. Electronically Signed   By: Cordella Banner   On: 02/25/2024 10:06   CT ABDOMEN PELVIS W CONTRAST Result Date: 02/25/2024 EXAM: CT ABDOMEN AND PELVIS WITH CONTRAST 02/24/2024 02:45:34 PM TECHNIQUE: CT of the abdomen and pelvis was performed with the administration of 100 mL of iohexol  (OMNIPAQUE ) 300 MG/ML solution. Multiplanar reformatted images are provided for review. Automated exposure control, iterative reconstruction, and/or weight-based adjustment of the mA/kV was utilized to reduce the radiation dose to as low as reasonably achievable. COMPARISON: CT abdomen and pelvis from 02/20/2024 and CT angio chest 02/16/2024. CLINICAL HISTORY: Abdominal pain, post-op. FINDINGS: LOWER CHEST: Aortic atherosclerosis and coronary artery calcifications. Trace anterior inferior pericardial fluid. Small bilateral pleural effusions are new from the previous exam. Atelectasis is identified within the posterior basal and lateral basal right lower lobe and posterior left base. Emphysema with diffuse bronchial wall thickening. The central airways are patent. Remote healed right posterior rib fractures. Thoracic degenerative disc disease. LIVER: There is a surgical drainage catheter which entered from a right lateral abdominal wall approach and courses through the gallbladder fossa and terminates along the falciform ligament. Previous loculated fluid collection containing gas has decreased in volume from the prior exam. Currently, this measures 4.2 x 2.6 x 2.1 cm, axial 65/3. On the previous examination, using the same measurement scheme as today, this fluid collection measured 9.3 x 3.0 x 4.9 cm. Similar appearance of inflammatory changes including soft tissue stranding and free fluid within the right upper quadrant of the abdomen. GALLBLADDER AND BILE  DUCTS: Status post cholecystectomy. Mild pneumobilia. Interval placement  of a common bile duct stent. No significant bile duct dilatation. SPLEEN: No acute abnormality. PANCREAS: No acute abnormality. ADRENAL GLANDS: No acute abnormality. KIDNEYS, URETERS AND BLADDER: 2.3 cm Bosniak class 1 cyst arises off the lateral left kidney. Per consensus, no follow-up is needed for simple Bosniak type 1 and 2 renal cysts, unless the patient has a malignancy history or risk factors. No stones in the kidneys or ureters. No hydronephrosis. No perinephric or periureteral stranding. Urinary bladder is unremarkable. GI AND BOWEL: Stomach demonstrates no acute abnormality. Persistent increased caliber of small bowel loops with multiple air-fluid levels, which measure up to 3.2 cm. Distal small bowel loops are decreased in caliber, and there is a normal caliber colon. Sigmoid diverticulosis is identified. Interval development of mild wall thickening with surrounding hyperemia of the vasorectal displacement of the sigmoid mesenteric fat suggestive of diverticulitis versus colitis. This is seen on axial image 104/3. There is no bowel obstruction. PERITONEUM AND RETROPERITONEUM: A maturing fluid collection within the pelvis with signs of early neural enhancement/loculation measuring 6.4 x 5.6 x 3.5 cm. No ascites. No free air. VASCULATURE: Aortic atherosclerotic calcification. Aorta is normal in caliber. LYMPH NODES: No abdominal or pelvic adenopathy. REPRODUCTIVE ORGANS: No acute abnormality. BONES AND SOFT TISSUES: Lumbar spondylosis. No acute osseous abnormality. No focal soft tissue abnormality. IMPRESSION: 1. Decreased volume of the previous loculated fluid collection containing gas in the right upper quadrant, now measuring 4.2 x 2.6 x 2.1 cm. 2. Maturing fluid collection within the pelvis with early rim enhancement/loculation, measuring 6.4 x 5.6 x 3.5 cm. 3. Persistent increased caliber of small bowel loops with multiple  air-fluid levels, measuring up to 3.2 cm. Differential considerations include postoperative ileus versus distal bowel obstruction. 4. Interval development of mild sigmoid wall thickening with surrounding hyperemia and mesenteric fat stranding, suggestive of diverticulitis versus colitis. 5. Status post cholecystectomy with interval placement of a common bile duct stent, without significant bile duct dilatation and with mild pneumobilia. Electronically signed by: Waddell Calk MD 02/25/2024 06:58 AM EST RP Workstation: [id number]   CT CHEST W CONTRAST Result Date: 02/25/2024 EXAM: CT ABDOMEN AND PELVIS WITH CONTRAST 02/24/2024 02:45:34 PM TECHNIQUE: CT of the abdomen and pelvis was performed with the administration of 100 mL of iohexol  (OMNIPAQUE ) 300 MG/ML solution. Multiplanar reformatted images are provided for review. Automated exposure control, iterative reconstruction, and/or weight-based adjustment of the mA/kV was utilized to reduce the radiation dose to as low as reasonably achievable. COMPARISON: CT abdomen and pelvis from 02/20/2024 and CT angio chest 02/16/2024. CLINICAL HISTORY: Abdominal pain, post-op. FINDINGS: LOWER CHEST: Aortic atherosclerosis and coronary artery calcifications. Trace anterior inferior pericardial fluid. Small bilateral pleural effusions are new from the previous exam. Atelectasis is identified within the posterior basal and lateral basal right lower lobe and posterior left base. Emphysema with diffuse bronchial wall thickening. The central airways are patent. Remote healed right posterior rib fractures. Thoracic degenerative disc disease. LIVER: There is a surgical drainage catheter which entered from a right lateral abdominal wall approach and courses through the gallbladder fossa and terminates along the falciform ligament. Previous loculated fluid collection containing gas has decreased in volume from the prior exam. Currently, this measures 4.2 x 2.6 x 2.1 cm, axial 65/3.  On the previous examination, using the same measurement scheme as today, this fluid collection measured 9.3 x 3.0 x 4.9 cm. Similar appearance of inflammatory changes including soft tissue stranding and free fluid within the right upper quadrant of the abdomen. GALLBLADDER AND BILE DUCTS:  Status post cholecystectomy. Mild pneumobilia. Interval placement of a common bile duct stent. No significant bile duct dilatation. SPLEEN: No acute abnormality. PANCREAS: No acute abnormality. ADRENAL GLANDS: No acute abnormality. KIDNEYS, URETERS AND BLADDER: 2.3 cm Bosniak class 1 cyst arises off the lateral left kidney. Per consensus, no follow-up is needed for simple Bosniak type 1 and 2 renal cysts, unless the patient has a malignancy history or risk factors. No stones in the kidneys or ureters. No hydronephrosis. No perinephric or periureteral stranding. Urinary bladder is unremarkable. GI AND BOWEL: Stomach demonstrates no acute abnormality. Persistent increased caliber of small bowel loops with multiple air-fluid levels, which measure up to 3.2 cm. Distal small bowel loops are decreased in caliber, and there is a normal caliber colon. Sigmoid diverticulosis is identified. Interval development of mild wall thickening with surrounding hyperemia of the vasorectal displacement of the sigmoid mesenteric fat suggestive of diverticulitis versus colitis. This is seen on axial image 104/3. There is no bowel obstruction. PERITONEUM AND RETROPERITONEUM: A maturing fluid collection within the pelvis with signs of early neural enhancement/loculation measuring 6.4 x 5.6 x 3.5 cm. No ascites. No free air. VASCULATURE: Aortic atherosclerotic calcification. Aorta is normal in caliber. LYMPH NODES: No abdominal or pelvic adenopathy. REPRODUCTIVE ORGANS: No acute abnormality. BONES AND SOFT TISSUES: Lumbar spondylosis. No acute osseous abnormality. No focal soft tissue abnormality. IMPRESSION: 1. Decreased volume of the previous loculated  fluid collection containing gas in the right upper quadrant, now measuring 4.2 x 2.6 x 2.1 cm. 2. Maturing fluid collection within the pelvis with early rim enhancement/loculation, measuring 6.4 x 5.6 x 3.5 cm. 3. Persistent increased caliber of small bowel loops with multiple air-fluid levels, measuring up to 3.2 cm. Differential considerations include postoperative ileus versus distal bowel obstruction. 4. Interval development of mild sigmoid wall thickening with surrounding hyperemia and mesenteric fat stranding, suggestive of diverticulitis versus colitis. 5. Status post cholecystectomy with interval placement of a common bile duct stent, without significant bile duct dilatation and with mild pneumobilia. Electronically signed by: Waddell Calk MD 02/25/2024 06:58 AM EST RP Workstation: [id number]   DG CHEST PORT 1 VIEW Result Date: 02/22/2024 CLINICAL DATA:  Shortness of breath. EXAM: PORTABLE CHEST 1 VIEW COMPARISON:  02/18/2024 FINDINGS: Normal heart size with stable mediastinal contours. Slight increase in atelectasis at the lung bases. No pulmonary edema, large pleural effusion or pneumothorax. Reverse left shoulder arthroplasty. IMPRESSION: Slight increase in atelectasis at the lung bases. Electronically Signed   By: Andrea Gasman M.D.   On: 02/22/2024 10:51   CT ABDOMEN PELVIS WO CONTRAST Result Date: 02/20/2024 EXAM: CT ABDOMEN AND PELVIS WITHOUT CONTRAST 02/20/2024 01:22:00 PM TECHNIQUE: CT of the abdomen and pelvis was performed without the administration of intravenous contrast. Multiplanar reformatted images are provided for review. Automated exposure control, iterative reconstruction, and/or weight-based adjustment of the mA/kV was utilized to reduce the radiation dose to as low as reasonably achievable. COMPARISON: HIDA scan dated 02/20/2024. CLINICAL HISTORY: Abdominal pain, post-op.Postoperative cholecystectomy day 3. FINDINGS: LOWER CHEST: Mild bibasilar atelectasis. LIVER: The liver  is unremarkable. GALLBLADDER AND BILE DUCTS: Collection in the gallbladder fossa measures 6.1 x 4.8 cm. The collection is low density. Percutaneous drain extends through the fluid collection. Cholecystectomy clips in the region of the cystic duct. There is no biliary ductal dilatation. The common bile duct is normal caliber. SPLEEN: No acute abnormality. PANCREAS: No evidence of pancreatitis on noncontrast exam. ADRENAL GLANDS: No acute abnormality. KIDNEYS, URETERS AND BLADDER: No stones in the kidneys or ureters.  No hydronephrosis. No perinephric or periureteral stranding. Urinary bladder is unremarkable. GI AND BOWEL: Stomach is normal. Duodenum appears normal. The bowel is fluid filled with air-fluid levels suggesting postoperative ileus. No pneumatosis. Stool in the rectum. PERITONEUM AND RETROPERITONEUM: Small amount of scattered gas in the upper peritoneal space related to recent laparoscopic surgery. Small amount of high density fluid collects dependently within the posterior cul de sac with a fluid-fluid level. Small volume hemoperitoneum presumably related to recent surgery. VASCULATURE: Aorta is normal in caliber. LYMPH NODES: No lymphadenopathy. REPRODUCTIVE ORGANS: No acute abnormality. BONES AND SOFT TISSUES: No acute osseous abnormality. No focal soft tissue abnormality. IMPRESSION: 1. Collection in the gallbladder fossa measuring 6.1 x 4.8 cm with percutaneous drain in place. Findings consistent with predominantly contained bowel gas. 2. Postoperative ileus. 3. Small volume hemoperitoneum complex in the posterior cul de sac , presumably related to recent surgery. 4. These results will be called to the ordering clinician or representative by the radiologist assistant, and communication documented in the pacs or clario dashboard Electronically signed by: Norleen Boxer MD 02/20/2024 03:59 PM EST RP Workstation: [id number]   DG Abd Portable 1V Result Date: 02/20/2024 CLINICAL DATA:  Abdominal  distension. EXAM: PORTABLE ABDOMEN - 1 VIEW COMPARISON:  02/18/2024 FINDINGS: Increasing gaseous distention of bowel loops in the central abdomen, primarily colonic distension, however prominent air-filled loops of small bowel are also seen. Drain in the right mid abdomen. Right upper quadrant surgical clips. IMPRESSION: Increasing gaseous distention of bowel loops in the central abdomen, primarily colonic distension, however prominent air-filled loops of small bowel are also seen. Favor generalized ileus. Electronically Signed   By: Andrea Gasman M.D.   On: 02/20/2024 14:05   NM HEPATOBILIARY LEAK (POST-SURGICAL) Result Date: 02/20/2024 EXAM: NM HEPATOBILLARY SCAN 02/20/2024 12:50:37 PM TECHNIQUE: RADIOPHARMACEUTICAL: 5.4 mCi Tc-43m mebrofenin  Dynamic images of the abdomen and pelvis were obtained in the anterior projection for 1 hour after intravenous administration of radiopharmaceutical. The biliary drain was clamped for the second hour of the exam and imaging continued for at least 30 minutes. COMPARISON: None available. CLINICAL HISTORY: Abdominal pain, post surgery or trauma, bile leak suspected. FINDINGS: Homogenous uptake within the liver. Normal clearance of the blood pool. Initial imaging demonstrates excreted radiotracer in the common hepatic duct with evidence of radiotracer within the biliary drain. The drain was clamped for the second hour of the exam. With the drain clamped, extra biliary radiotracer collects within the gallbladder fossa. Findings consistent with contained bowel leak within the gallbladder fossa. Radiotracer evacuating through the biliary drain when unclamped. No activity in the small bowel. IMPRESSION: 1. Radiotracer evacuates through the biliary drain when unclamped. 2. With drain clamped, extra biliary radiotracer collects within the gallbladder fossa. 3. Findings consistent with biliary leak . No activity in the small bowel suggests brisk leak. 4. These results will be  called to the ordering clinician or representative by the radiologist assistant, and communication documented in the pacs or clario dashboard Electronically signed by: Norleen Boxer MD 02/20/2024 01:11 PM EST RP Workstation: [id number]   MR ABDOMEN MRCP WO CONTRAST Result Date: 02/19/2024 CLINICAL DATA:  Postop day 1 from laparoscopic cholecystectomy. Elevated LFTs. Clinical concern for retained common bile duct stone. EXAM: MRI ABDOMEN WITHOUT CONTRAST  (INCLUDING MRCP) TECHNIQUE: Multiplanar multisequence MR imaging of the abdomen was performed. Heavily T2-weighted images of the biliary and pancreatic ducts were obtained, and three-dimensional MRCP images were rendered by post processing. COMPARISON:  Abdomen and pelvis CT 02/16/2024 FINDINGS: Lower chest: Dependent  atelectasis with tiny right pleural effusion. Hepatobiliary: No suspicious focal abnormality in the liver on this noncontrast study. There is some periportal edema with edema in the inferior liver around the gallbladder fossa. Heterogeneous signal intensity in the gallbladder fossa likely reflects a combination of postoperative fluid/blood products in packing material. No substantial intrahepatic biliary duct dilatation. Common duct measures 7 mm diameter in the porta hepatis. Common bile duct just proximal to the ampulla is 7 mm diameter. There is no evidence for choledocholithiasis. Pancreas: No focal mass lesion. No dilatation of the main duct. No intraparenchymal cyst. No peripancreatic edema. Edema around the duodenum and pancreatic head is 2 compatible with recent surgery. Spleen:  No splenomegaly. No suspicious focal mass lesion. Adrenals/Urinary Tract: No adrenal nodule or mass. Bilateral renal cysts evident. Stomach/Bowel: Stomach is moderately distended with fluid. Small bowel and colon shows mild diffuse gaseous distension. Vascular/Lymphatic: No abdominal aortic aneurysm small retroperitoneal lymph nodes are similar to recent CT scan.  Other: Edema in the anterior abdominal wall is compatible with the recent surgical access. There is dependent subcutaneous body wall edema bilaterally bilaterally. Musculoskeletal: No overtly suspicious marrow signal abnormality. IMPRESSION: 1. Postop day 1 from cholecystectomy. Heterogeneous signal intensity in the gallbladder fossa likely reflects a combination of postoperative fluid/blood products and packing material. 2. No substantial intrahepatic biliary duct dilatation. Common duct measures 7 mm diameter in the porta hepatis and just proximal to the ampulla. No evidence for choledocholithiasis. 3. Mild diffuse gaseous distension of the small bowel and colon compatible with mild postoperative ileus. 4. Dependent atelectasis with tiny right pleural effusion. Electronically Signed   By: Camellia Candle M.D.   On: 02/19/2024 05:32   DG Abd Portable 1V Result Date: 02/18/2024 CLINICAL DATA:  Distension postop cholecystostomy EXAM: PORTABLE ABDOMEN - 1 VIEW COMPARISON:  CT 02/16/2024 FINDINGS: Postop changes in the right upper quadrant with clips and probable drainage catheter. Mild increased small and large bowel gas without obstruction. Moderate stool IMPRESSION: Mild increased small and large bowel gas without obstruction, possible mild ileus. Moderate stool. Electronically Signed   By: Luke Bun M.D.   On: 02/18/2024 15:59   DG Chest Port 1 View Result Date: 02/18/2024 CLINICAL DATA:  Shortness of breath EXAM: PORTABLE CHEST 1 VIEW COMPARISON:  02/16/2024 FINDINGS: Hypoventilatory change. Minimal atelectasis right base. No consolidation, pleural effusion or pneumothorax. Normal cardiac size. Aortic atherosclerosis. Left shoulder replacement IMPRESSION: Hypoventilatory changes with minimal atelectasis at the right base. Electronically Signed   By: Luke Bun M.D.   On: 02/18/2024 15:58   CT ABDOMEN PELVIS W CONTRAST Result Date: 02/16/2024 EXAM: CT ABDOMEN AND PELVIS WITH CONTRAST 02/16/2024  07:57:25 PM TECHNIQUE: CT of the abdomen and pelvis was performed with the administration of 100 mL of iohexol  (OMNIPAQUE ) 350 MG/ML injection. Multiplanar reformatted images are provided for review. Automated exposure control, iterative reconstruction, and/or weight-based adjustment of the mA/kV was utilized to reduce the radiation dose to as low as reasonably achievable. COMPARISON: None available. CLINICAL HISTORY: Epigastric pain; cp, epigastric abd pain, leukocytosis. FINDINGS: LOWER CHEST: No acute abnormality. LIVER: Hepatic steatosis. GALLBLADDER AND BILE DUCTS: Distended gallbladder with wall thickening and pericholecystic fluid compatible with acute cholecystitis. No radiopaque stone or biliary ductal dilatation. SPLEEN: No acute abnormality. PANCREAS: No acute abnormality. ADRENAL GLANDS: No acute abnormality. KIDNEYS, URETERS AND BLADDER: No stones in the kidneys or ureters. No hydronephrosis. No perinephric or periureteral stranding. Urinary bladder is unremarkable. GI AND BOWEL: Stomach demonstrates no acute abnormality. Colonic diverticulosis without evidence of  diverticulitis. There is no bowel obstruction. PERITONEUM AND RETROPERITONEUM: No ascites. No free air. VASCULATURE: Aorta is normal in caliber. Aortic atherosclerotic calcification. LYMPH NODES: No lymphadenopathy. REPRODUCTIVE ORGANS: No acute abnormality. BONES AND SOFT TISSUES: No acute osseous abnormality. No focal soft tissue abnormality. IMPRESSION: 1. Acute cholecystitis. Electronically signed by: Norman Gatlin MD 02/16/2024 08:10 PM EST RP Workstation: [id number]   CT Angio Chest PE W and/or Wo Contrast Result Date: 02/16/2024 EXAM: CTA CHEST 02/16/2024 07:57:25 PM TECHNIQUE: CTA of the chest was performed with the administration of 100 mL of iohexol  (OMNIPAQUE ) 350 MG/ML injection. Multiplanar reformatted images are provided for review. MIP images are provided for review. Automated exposure control, iterative reconstruction,  and/or weight based adjustment of the mA/kV was utilized to reduce the radiation dose to as low as reasonably achievable. COMPARISON: Comparison with the same day x-ray. CLINICAL HISTORY: Pulmonary embolism (PE) suspected, high prob; cough, cp, abd pain. FINDINGS: PULMONARY ARTERIES: Pulmonary arteries are adequately opacified for evaluation. No acute pulmonary embolus. Main pulmonary artery is normal in caliber. MEDIASTINUM: Coronary artery and aortic atherosclerotic calcification. The heart and pericardium demonstrate no acute abnormality. There is no acute abnormality of the thoracic aorta. LYMPH NODES: No mediastinal, hilar or axillary lymphadenopathy. LUNGS AND PLEURA: Emphysema. Right lower lobe atelectasis. No evidence of pleural effusion or pneumothorax. UPPER ABDOMEN: Hepatic steatosis. SOFT TISSUES AND BONES: No acute bone or soft tissue abnormality. IMPRESSION: 1. No pulmonary embolism. 2. Emphysema. Pulmonary emphysema is an independent risk factor for lung cancer. Recommend consideration for evaluation for a low-dose CT lung cancer screening program. Electronically signed by: Norman Gatlin MD 02/16/2024 08:07 PM EST RP Workstation: [id number]   DG Chest Portable 1 View Result Date: 02/16/2024 CLINICAL DATA:  Cough. EXAM: PORTABLE CHEST 1 VIEW COMPARISON:  05/11/2021. FINDINGS: The heart size and mediastinal contours are within normal limits. There is elevation of the right diaphragm. Atelectasis is noted at the lung bases bilaterally. No effusion or pneumothorax is seen. 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 IMPRESSION: Elevation of the right diaphragm with atelectasis at the lung bases. Electronically Signed   By: Leita Birmingham M.D.   On: 02/16/2024 17:48    Labs:  CBC: Recent Labs    02/22/24 0300 02/23/24 0250 02/24/24 0350 02/25/24 0355  WBC 17.1* 14.4* 23.5* 20.7*  HGB 9.3* 9.5* 11.2* 10.6*  HCT 29.4* 30.0* 33.9* 32.2*  PLT 373 402* 489* 495*    COAGS: Recent Labs     02/25/24 0837  INR 1.1    BMP: Recent Labs    02/21/24 0235 02/22/24 0300 02/24/24 0350 02/25/24 0355  NA 141 139 135 134*  K 3.5 3.6 3.5 3.1*  CL 104 102 101 99  CO2 23 23 22 23   GLUCOSE 120* 132* 134* 144*  BUN 22 19 9 9   CALCIUM 9.1 8.8* 9.3 9.3  CREATININE 0.59* 0.72 0.71 0.70  GFRNONAA >60 >60 >60 >60    LIVER FUNCTION TESTS: Recent Labs    02/21/24 0235 02/22/24 0300 02/24/24 0350 02/25/24 0355  BILITOT 1.6* 1.2 1.2 1.0  AST 33 33 35 20  ALT 135* 95* 77* 56*  ALKPHOS 384* 185* 189* 167*  PROT 6.9 6.4* 6.8 6.9  ALBUMIN  3.4* 3.2* 3.6 3.6    TUMOR MARKERS: No results for input(s): AFPTM, CEA, CA199, CHROMGRNA in the last 8760 hours.  Assessment and Plan: Post operative pelvic fluid collection-  Request for image guided transgluteal drain placement.  No contraindications for procedure identified in ROS, physical exam, or review  of pre-sedation considerations.  Labs reviewed, persistent leukocytosis (WBC 20.7 today) Imaging available and reviewed VSS, continued intermittent low-grade fevers (100.43F yesterday) AM dose of Lovenox  held   Risks and benefits discussed with the patient including bleeding, infection, damage to adjacent structures, bowel perforation/fistula connection, and sepsis.  All of the patient's questions were answered, patient is agreeable to proceed. Consent signed and in chart.    Thank you for allowing our service to participate in ELISEO WITHERS 's care.    Electronically Signed: Boruch Manuele B Oneda Duffett, NP   02/25/2024, 11:16 AM     I spent a total of 20 Minutes in face to face in clinical consultation, greater than 50% of which was counseling/coordinating care for image guided transgluteal drain placement for post operative fluid collection.   (A copy of this note was sent to the referring provider and the time of visit.)  "

## 2024-02-26 DIAGNOSIS — K810 Acute cholecystitis: Secondary | ICD-10-CM | POA: Diagnosis not present

## 2024-02-26 LAB — CBC WITH DIFFERENTIAL/PLATELET
Abs Immature Granulocytes: 0.29 K/uL — ABNORMAL HIGH (ref 0.00–0.07)
Basophils Absolute: 0 K/uL (ref 0.0–0.1)
Basophils Relative: 0 %
Eosinophils Absolute: 0.2 K/uL (ref 0.0–0.5)
Eosinophils Relative: 2 %
HCT: 31.9 % — ABNORMAL LOW (ref 39.0–52.0)
Hemoglobin: 10.3 g/dL — ABNORMAL LOW (ref 13.0–17.0)
Immature Granulocytes: 2 %
Lymphocytes Relative: 8 %
Lymphs Abs: 1.2 K/uL (ref 0.7–4.0)
MCH: 29.2 pg (ref 26.0–34.0)
MCHC: 32.3 g/dL (ref 30.0–36.0)
MCV: 90.4 fL (ref 80.0–100.0)
Monocytes Absolute: 0.9 K/uL (ref 0.1–1.0)
Monocytes Relative: 6 %
Neutro Abs: 11.7 K/uL — ABNORMAL HIGH (ref 1.7–7.7)
Neutrophils Relative %: 82 %
Platelets: 450 K/uL — ABNORMAL HIGH (ref 150–400)
RBC: 3.53 MIL/uL — ABNORMAL LOW (ref 4.22–5.81)
RDW: 13.2 % (ref 11.5–15.5)
WBC: 14.3 K/uL — ABNORMAL HIGH (ref 4.0–10.5)
nRBC: 0 % (ref 0.0–0.2)

## 2024-02-26 LAB — BASIC METABOLIC PANEL WITH GFR
Anion gap: 15 (ref 5–15)
BUN: 8 mg/dL (ref 8–23)
CO2: 21 mmol/L — ABNORMAL LOW (ref 22–32)
Calcium: 9.3 mg/dL (ref 8.9–10.3)
Chloride: 99 mmol/L (ref 98–111)
Creatinine, Ser: 0.78 mg/dL (ref 0.61–1.24)
GFR, Estimated: >60 mL/min
Glucose, Bld: 126 mg/dL — ABNORMAL HIGH (ref 70–99)
Potassium: 4 mmol/L (ref 3.5–5.1)
Sodium: 134 mmol/L — ABNORMAL LOW (ref 135–145)

## 2024-02-26 NOTE — Plan of Care (Signed)
  Problem: Health Behavior/Discharge Planning: Goal: Ability to manage health-related needs will improve Outcome: Progressing   Problem: Clinical Measurements: Goal: Ability to maintain clinical measurements within normal limits will improve Outcome: Progressing   Problem: Pain Managment: Goal: General experience of comfort will improve and/or be controlled Outcome: Progressing   Problem: Safety: Goal: Ability to remain free from injury will improve Outcome: Progressing   Problem: Skin Integrity: Goal: Risk for impaired skin integrity will decrease Outcome: Progressing

## 2024-02-26 NOTE — Progress Notes (Addendum)
 " [facility] Progress Note  Patient: Nathan Stewart     FMW:[id number]  DOA: 02/16/2024   PCP: [name], MD       Brief hospital course: 69 y.o. 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 He states the pain resolved, but for the last week he has been having nausea and unable to eat. He has been drinking though and able to keep it down. Pt was found to have acute cholecystitis (no stones seen on CT) on a CT scan. His WBC 18.0 in ED. General surgery consulted and pt taken to surgery for lap chole on 02/17/24.  He developed a CBD stone and a bile leak which were found on ERCP on 12/27.  Repeat CT of the abd/pelvis revealed a fluid collection in the R pelvis.  12/31 IR guided drain placement  Subjective:  He has no complaints.   Assessment and Plan: Principal Problem:   Acute cholecystitis - complicated by CBD stone, bile leak and pelvic abscess - now with drain - cont IV abx  - WBC count is improving  Active Problems:  Thrombocytosis - likely due to above - follow     Hypokalemia - replaced    Hyponatremia   Dehydration - given IVF          Code Status: Full Code Total time on patient care: 35 min DVT prophylaxis:  enoxaparin  (LOVENOX ) injection 40 mg Start: 02/23/24 1000 Place and maintain sequential compression device Start: 02/21/24 1124 SCD's Start: 02/17/24 1709     Objective:   Vitals:   02/25/24 1555 02/25/24 1629 02/26/24 0555 02/26/24 1024  BP: 129/74 135/68 133/70 (!) 147/70  Pulse: 86 88 76 86  Resp: 20 18 19    Temp:  98.6 F (37 C) 98.3 F (36.8 C) 98.2 F (36.8 C)  TempSrc:  Oral Oral Oral  SpO2: 97% 98% 96% 96%  Weight:      Height:       Filed Weights   02/17/24 0804 02/17/24 1229  Weight: 103 kg 103 kg   Exam: General exam: Appears comfortable   HEENT: oral mucosa moist Respiratory system: Clear to auscultation.  Cardiovascular system: S1 & S2 heard  Gastrointestinal system: Abdomen soft, non-tender, nondistended. Normal bowel sounds  - drain in pelvis contains bloody fluid Extremities: No cyanosis, clubbing or edema Psychiatry:  Mood & affect appropriate.      CBC: Recent Labs  Lab 02/22/24 0300 02/23/24 0250 02/24/24 0350 02/25/24 0355 02/26/24 0355  WBC 17.1* 14.4* 23.5* 20.7* 14.3*  NEUTROABS  --   --   --   --  11.7*  HGB 9.3* 9.5* 11.2* 10.6* 10.3*  HCT 29.4* 30.0* 33.9* 32.2* 31.9*  MCV 91.9 92.6 89.9 89.2 90.4  PLT 373 402* 489* 495* 450*   Basic Metabolic Panel: Recent Labs  Lab 02/21/24 0235 02/22/24 0300 02/24/24 0350 02/25/24 0355 02/26/24 0355  NA 141 139 135 134* 134*  K 3.5 3.6 3.5 3.1* 4.0  CL 104 102 101 99 99  CO2 23 23 22 23  21*  GLUCOSE 120* 132* 134* 144* 126*  BUN 22 19 9 9 8   CREATININE 0.59* 0.72 0.71 0.70 0.78  CALCIUM 9.1 8.8* 9.3 9.3 9.3     Scheduled Meds:  acetaminophen   1,000 mg Oral Q6H   amLODipine   5 mg  Oral Daily   enoxaparin  (LOVENOX ) injection  40 mg Subcutaneous Q24H   feeding supplement  1 Container Oral TID BM   hydrochlorothiazide   25 mg Oral Daily   ipratropium-albuterol   3 mL Nebulization BID   metoprolol  succinate  50 mg Oral Daily   polyethylene glycol  17 g Oral Daily   senna-docusate  1 tablet Oral QHS    Imaging and lab data personally reviewed   Author: Erinne Gillentine  02/26/2024 12:25 PM  To contact [facility]>   Check the care team in [facility] and look for the attending/consulting [facility] provider listed  Log into [email or web address] and use [facility] universal password   Go to> [facility]  and find provider  If you still have difficulty reaching the provider, please page the [facility] (Director on Call) for the Hospitalists listed on amion     "

## 2024-02-26 NOTE — Progress Notes (Signed)
 5 Days Post-Op   Subjective/Chief Complaint: Drain yesterday, no issues today   Objective: Vital signs in last 24 hours: Temp:  [98.3 F (36.8 C)-98.6 F (37 C)] 98.3 F (36.8 C) (01/01 0555) Pulse Rate:  [76-89] 76 (01/01 0555) Resp:  [17-28] 19 (01/01 0555) BP: (116-145)/(65-80) 133/70 (01/01 0555) SpO2:  [95 %-99 %] 96 % (01/01 0555) Last BM Date : 02/24/24  Intake/Output from previous day: 12/31 0701 - 01/01 0700 In: 1015 [P.O.:840; IV Piggyback:175] Out: 75 [Drains:75] Intake/Output this shift: No intake/output data recorded.  Ab soft nontender incisions clean jps ss  Lab Results:  Recent Labs    02/25/24 0355 02/26/24 0355  WBC 20.7* 14.3*  HGB 10.6* 10.3*  HCT 32.2* 31.9*  PLT 495* 450*   BMET Recent Labs    02/25/24 0355 02/26/24 0355  NA 134* 134*  K 3.1* 4.0  CL 99 99  CO2 23 21*  GLUCOSE 144* 126*  BUN 9 8  CREATININE 0.70 0.78  CALCIUM 9.3 9.3   PT/INR Recent Labs    02/25/24 0837  LABPROT 14.5  INR 1.1   ABG No results for input(s): PHART, HCO3 in the last 72 hours.  Invalid input(s): PCO2, PO2  Studies/Results: CT GUIDED PERITONEAL/RETROPERITONEAL FLUID DRAIN BY PERC CATH Result Date: 02/25/2024 INDICATION: 69 year old with a postoperative pelvic fluid collection. EXAM: CT-GUIDED PELVIC DRAIN PLACEMENT TECHNIQUE: Multidetector CT imaging of the pelvis was performed following the standard protocol without IV contrast. RADIATION DOSE REDUCTION: This exam was performed according to the departmental dose-optimization program which includes automated exposure control, adjustment of the mA and/or kV according to patient size and/or use of iterative reconstruction technique. MEDICATIONS: Moderate sedation ANESTHESIA/SEDATION: Moderate (conscious) sedation was employed during this procedure. A total of Versed  2 mg and Fentanyl  150 mcg was administered intravenously by the radiology nurse. Total intra-service moderate Sedation Time: 25  minutes. The patient's level of consciousness and vital signs were monitored continuously by radiology nursing throughout the procedure under my direct supervision. COMPLICATIONS: None immediate. PROCEDURE: Informed written consent was obtained from the patient after a thorough discussion of the procedural risks, benefits and alternatives. All questions were addressed. Maximal Sterile Barrier Technique was utilized including caps, mask, sterile gowns, sterile gloves, sterile drape, hand hygiene and skin antiseptic. A timeout was performed prior to the initiation of the procedure. Patient was placed prone. CT images of the pelvis were obtained. Left buttock was prepped with chlorhexidine  and sterile field was created. Skin was anesthetized using 1% lidocaine . Small incision was made. Using CT guidance, an 18 gauge trocar needle was directed into the pelvic fluid collection using a transgluteal approach. Bloody fluid was aspirated. Superstiff Amplatz wire was advanced into the collection. The tract was dilated to accommodate a 10 French multipurpose drain. 15 mL of bloody fluid was aspirated from the collection. Drain was attached to a suction bulb and sutured to skin. FINDINGS: Drain was placed into the pelvic fluid collection using a left transgluteal approach. 15 mL of bloody fluid was aspirated. IMPRESSION: CT-guided placement of a drainage catheter in the pelvic fluid collection. Electronically Signed   By: Juliene Balder M.D.   On: 02/25/2024 16:54   CT ABDOMEN PELVIS W CONTRAST Result Date: 02/25/2024 EXAM: CT ABDOMEN AND PELVIS WITH CONTRAST 02/24/2024 02:45:34 PM TECHNIQUE: CT of the abdomen and pelvis was performed with the administration of 100 mL of iohexol  (OMNIPAQUE ) 300 MG/ML solution. Multiplanar reformatted images are provided for review. Automated exposure control, iterative reconstruction, and/or weight-based adjustment of  the mA/kV was utilized to reduce the radiation dose to as low as reasonably  achievable. COMPARISON: CT abdomen and pelvis from 02/20/2024 and CT angio chest 02/16/2024. CLINICAL HISTORY: Abdominal pain, post-op. FINDINGS: LOWER CHEST: Aortic atherosclerosis and coronary artery calcifications. Trace anterior inferior pericardial fluid. Small bilateral pleural effusions are new from the previous exam. Atelectasis is identified within the posterior basal and lateral basal right lower lobe and posterior left base. Emphysema with diffuse bronchial wall thickening. The central airways are patent. Remote healed right posterior rib fractures. Thoracic degenerative disc disease. LIVER: There is a surgical drainage catheter which entered from a right lateral abdominal wall approach and courses through the gallbladder fossa and terminates along the falciform ligament. Previous loculated fluid collection containing gas has decreased in volume from the prior exam. Currently, this measures 4.2 x 2.6 x 2.1 cm, axial 65/3. On the previous examination, using the same measurement scheme as today, this fluid collection measured 9.3 x 3.0 x 4.9 cm. Similar appearance of inflammatory changes including soft tissue stranding and free fluid within the right upper quadrant of the abdomen. GALLBLADDER AND BILE DUCTS: Status post cholecystectomy. Mild pneumobilia. Interval placement of a common bile duct stent. No significant bile duct dilatation. SPLEEN: No acute abnormality. PANCREAS: No acute abnormality. ADRENAL GLANDS: No acute abnormality. KIDNEYS, URETERS AND BLADDER: 2.3 cm Bosniak class 1 cyst arises off the lateral left kidney. Per consensus, no follow-up is needed for simple Bosniak type 1 and 2 renal cysts, unless the patient has a malignancy history or risk factors. No stones in the kidneys or ureters. No hydronephrosis. No perinephric or periureteral stranding. Urinary bladder is unremarkable. GI AND BOWEL: Stomach demonstrates no acute abnormality. Persistent increased caliber of small bowel loops with  multiple air-fluid levels, which measure up to 3.2 cm. Distal small bowel loops are decreased in caliber, and there is a normal caliber colon. Sigmoid diverticulosis is identified. Interval development of mild wall thickening with surrounding hyperemia of the vasorectal displacement of the sigmoid mesenteric fat suggestive of diverticulitis versus colitis. This is seen on axial image 104/3. There is no bowel obstruction. PERITONEUM AND RETROPERITONEUM: A maturing fluid collection within the pelvis with signs of early neural enhancement/loculation measuring 6.4 x 5.6 x 3.5 cm. No ascites. No free air. VASCULATURE: Aortic atherosclerotic calcification. Aorta is normal in caliber. LYMPH NODES: No abdominal or pelvic adenopathy. REPRODUCTIVE ORGANS: No acute abnormality. BONES AND SOFT TISSUES: Lumbar spondylosis. No acute osseous abnormality. No focal soft tissue abnormality. IMPRESSION: 1. Decreased volume of the previous loculated fluid collection containing gas in the right upper quadrant, now measuring 4.2 x 2.6 x 2.1 cm. 2. Maturing fluid collection within the pelvis with early rim enhancement/loculation, measuring 6.4 x 5.6 x 3.5 cm. 3. Persistent increased caliber of small bowel loops with multiple air-fluid levels, measuring up to 3.2 cm. Differential considerations include postoperative ileus versus distal bowel obstruction. 4. Interval development of mild sigmoid wall thickening with surrounding hyperemia and mesenteric fat stranding, suggestive of diverticulitis versus colitis. 5. Status post cholecystectomy with interval placement of a common bile duct stent, without significant bile duct dilatation and with mild pneumobilia. Electronically signed by: Waddell Calk MD 02/25/2024 06:58 AM EST RP Workstation: [id number]   CT CHEST W CONTRAST Result Date: 02/25/2024 EXAM: CT ABDOMEN AND PELVIS WITH CONTRAST 02/24/2024 02:45:34 PM TECHNIQUE: CT of the abdomen and pelvis was performed with the administration  of 100 mL of iohexol  (OMNIPAQUE ) 300 MG/ML solution. Multiplanar reformatted images are provided for review. Automated  exposure control, iterative reconstruction, and/or weight-based adjustment of the mA/kV was utilized to reduce the radiation dose to as low as reasonably achievable. COMPARISON: CT abdomen and pelvis from 02/20/2024 and CT angio chest 02/16/2024. CLINICAL HISTORY: Abdominal pain, post-op. FINDINGS: LOWER CHEST: Aortic atherosclerosis and coronary artery calcifications. Trace anterior inferior pericardial fluid. Small bilateral pleural effusions are new from the previous exam. Atelectasis is identified within the posterior basal and lateral basal right lower lobe and posterior left base. Emphysema with diffuse bronchial wall thickening. The central airways are patent. Remote healed right posterior rib fractures. Thoracic degenerative disc disease. LIVER: There is a surgical drainage catheter which entered from a right lateral abdominal wall approach and courses through the gallbladder fossa and terminates along the falciform ligament. Previous loculated fluid collection containing gas has decreased in volume from the prior exam. Currently, this measures 4.2 x 2.6 x 2.1 cm, axial 65/3. On the previous examination, using the same measurement scheme as today, this fluid collection measured 9.3 x 3.0 x 4.9 cm. Similar appearance of inflammatory changes including soft tissue stranding and free fluid within the right upper quadrant of the abdomen. GALLBLADDER AND BILE DUCTS: Status post cholecystectomy. Mild pneumobilia. Interval placement of a common bile duct stent. No significant bile duct dilatation. SPLEEN: No acute abnormality. PANCREAS: No acute abnormality. ADRENAL GLANDS: No acute abnormality. KIDNEYS, URETERS AND BLADDER: 2.3 cm Bosniak class 1 cyst arises off the lateral left kidney. Per consensus, no follow-up is needed for simple Bosniak type 1 and 2 renal cysts, unless the patient has a  malignancy history or risk factors. No stones in the kidneys or ureters. No hydronephrosis. No perinephric or periureteral stranding. Urinary bladder is unremarkable. GI AND BOWEL: Stomach demonstrates no acute abnormality. Persistent increased caliber of small bowel loops with multiple air-fluid levels, which measure up to 3.2 cm. Distal small bowel loops are decreased in caliber, and there is a normal caliber colon. Sigmoid diverticulosis is identified. Interval development of mild wall thickening with surrounding hyperemia of the vasorectal displacement of the sigmoid mesenteric fat suggestive of diverticulitis versus colitis. This is seen on axial image 104/3. There is no bowel obstruction. PERITONEUM AND RETROPERITONEUM: A maturing fluid collection within the pelvis with signs of early neural enhancement/loculation measuring 6.4 x 5.6 x 3.5 cm. No ascites. No free air. VASCULATURE: Aortic atherosclerotic calcification. Aorta is normal in caliber. LYMPH NODES: No abdominal or pelvic adenopathy. REPRODUCTIVE ORGANS: No acute abnormality. BONES AND SOFT TISSUES: Lumbar spondylosis. No acute osseous abnormality. No focal soft tissue abnormality. IMPRESSION: 1. Decreased volume of the previous loculated fluid collection containing gas in the right upper quadrant, now measuring 4.2 x 2.6 x 2.1 cm. 2. Maturing fluid collection within the pelvis with early rim enhancement/loculation, measuring 6.4 x 5.6 x 3.5 cm. 3. Persistent increased caliber of small bowel loops with multiple air-fluid levels, measuring up to 3.2 cm. Differential considerations include postoperative ileus versus distal bowel obstruction. 4. Interval development of mild sigmoid wall thickening with surrounding hyperemia and mesenteric fat stranding, suggestive of diverticulitis versus colitis. 5. Status post cholecystectomy with interval placement of a common bile duct stent, without significant bile duct dilatation and with mild pneumobilia.  Electronically signed by: Waddell Calk MD 02/25/2024 06:58 AM EST RP Workstation: [id number]    Anti-infectives: Anti-infectives (From admission, onward)    Start     Dose/Rate Route Frequency Ordered Stop   02/23/24 2000  piperacillin -tazobactam (ZOSYN ) IVPB 3.375 g        3.375 g 12.5  mL/hr over 240 Minutes Intravenous Every 8 hours 02/23/24 1848     02/18/24 2000  piperacillin -tazobactam (ZOSYN ) IVPB 3.375 g        3.375 g 12.5 mL/hr over 240 Minutes Intravenous Every 8 hours 02/18/24 1400 02/23/24 1646   02/17/24 2200  piperacillin -tazobactam (ZOSYN ) IVPB 3.375 g        3.375 g 12.5 mL/hr over 240 Minutes Intravenous Every 8 hours 02/17/24 1708 02/18/24 1652   02/17/24 0600  piperacillin -tazobactam (ZOSYN ) IVPB 3.375 g  Status:  Discontinued        3.375 g 12.5 mL/hr over 240 Minutes Intravenous Every 8 hours 02/17/24 0025 02/17/24 1708   02/16/24 2145  piperacillin -tazobactam (ZOSYN ) IVPB 3.375 g        3.375 g 100 mL/hr over 30 Minutes Intravenous  Once 02/16/24 2137 02/16/24 2232       Assessment/Plan: POD 9 lap chole- EW PPD 5 ERCP for leak -regular diet -monitor JP drainage -continue abx, likely change to orals as he should be able to go home tomorrow -drain in yesterday, wbc better -lovenox  -will have follow up with surgery and will need IR to setup follow up and anything with their drain    Donnice Bury 02/26/2024

## 2024-02-27 ENCOUNTER — Other Ambulatory Visit (HOSPITAL_COMMUNITY): Payer: Self-pay

## 2024-02-27 ENCOUNTER — Other Ambulatory Visit: Payer: Self-pay | Admitting: General Surgery

## 2024-02-27 DIAGNOSIS — K810 Acute cholecystitis: Secondary | ICD-10-CM | POA: Diagnosis not present

## 2024-02-27 DIAGNOSIS — R188 Other ascites: Secondary | ICD-10-CM

## 2024-02-27 LAB — CBC
HCT: 33.9 % — ABNORMAL LOW (ref 39.0–52.0)
Hemoglobin: 10.8 g/dL — ABNORMAL LOW (ref 13.0–17.0)
MCH: 28.9 pg (ref 26.0–34.0)
MCHC: 31.9 g/dL (ref 30.0–36.0)
MCV: 90.6 fL (ref 80.0–100.0)
Platelets: 642 K/uL — ABNORMAL HIGH (ref 150–400)
RBC: 3.74 MIL/uL — ABNORMAL LOW (ref 4.22–5.81)
RDW: 13.1 % (ref 11.5–15.5)
WBC: 11.5 K/uL — ABNORMAL HIGH (ref 4.0–10.5)
nRBC: 0 % (ref 0.0–0.2)

## 2024-02-27 MED ORDER — SODIUM CHLORIDE 0.9% FLUSH: 5.0000 mL | Freq: Three times a day (TID) | INTRAVENOUS | Status: DC

## 2024-02-27 MED ORDER — AMOXICILLIN-POT CLAVULANATE 875-125 MG PO TABS
1.0000 | ORAL_TABLET | Freq: Two times a day (BID) | ORAL | 0 refills | Status: DC
  Filled 2024-02-27: qty 14, 7d supply, fill #0

## 2024-02-27 MED ORDER — AMOXICILLIN-POT CLAVULANATE 875-125 MG PO TABS
1.0000 | ORAL_TABLET | Freq: Two times a day (BID) | ORAL | 0 refills | Status: AC
  Filled 2024-02-27: qty 20, 10d supply, fill #0

## 2024-02-27 NOTE — Progress Notes (Signed)
 6 Days Post-Op   Subjective/Chief Complaint: No issues today Wife at [facility] Daughter on phone Doesn't like the food No vomiting walking   Objective: Vital signs in last 24 hours: Temp:  [98.2 F (36.8 C)-98.4 F (36.9 C)] 98.2 F (36.8 C) (01/02 0526) Pulse Rate:  [76-86] 76 (01/02 0526) Resp:  [16-18] 18 (01/02 0526) BP: (109-147)/(70-95) 142/74 (01/02 0526) SpO2:  [96 %-99 %] 97 % (01/02 0526) Last BM Date : 02/26/24  Intake/Output from previous day: 01/01 0701 - 01/02 0700 In: 911.5 [P.O.:600; IV Piggyback:191.5] Out: 28 [Drains:28] Intake/Output this shift: No intake/output data recorded.  Ab soft nontender incisions clean jps ss; Rt Jp - light bile  Lab Results:  Recent Labs    02/26/24 0355 02/27/24 0316  WBC 14.3* 11.5*  HGB 10.3* 10.8*  HCT 31.9* 33.9*  PLT 450* 642*   BMET Recent Labs    02/25/24 0355 02/26/24 0355  NA 134* 134*  K 3.1* 4.0  CL 99 99  CO2 23 21*  GLUCOSE 144* 126*  BUN 9 8  CREATININE 0.70 0.78  CALCIUM 9.3 9.3   PT/INR Recent Labs    02/25/24 0837  LABPROT 14.5  INR 1.1   ABG No results for input(s): PHART, HCO3 in the last 72 hours.  Invalid input(s): PCO2, PO2  Studies/Results: CT GUIDED PERITONEAL/RETROPERITONEAL FLUID DRAIN BY PERC CATH Result Date: 02/25/2024 INDICATION: 69 year old with a postoperative pelvic fluid collection. EXAM: CT-GUIDED PELVIC DRAIN PLACEMENT TECHNIQUE: Multidetector CT imaging of the pelvis was performed following the standard protocol without IV contrast. RADIATION DOSE REDUCTION: This exam was performed according to the departmental dose-optimization program which includes automated exposure control, adjustment of the mA and/or kV according to patient size and/or use of iterative reconstruction technique. MEDICATIONS: Moderate sedation ANESTHESIA/SEDATION: Moderate (conscious) sedation was employed during this procedure. A total of Versed  2 mg and Fentanyl  150 mcg was administered  intravenously by the radiology nurse. Total intra-service moderate Sedation Time: 25 minutes. The patient's level of consciousness and vital signs were monitored continuously by radiology nursing throughout the procedure under my direct supervision. COMPLICATIONS: None immediate. PROCEDURE: Informed written consent was obtained from the patient after a thorough discussion of the procedural risks, benefits and alternatives. All questions were addressed. Maximal Sterile Barrier Technique was utilized including caps, mask, sterile gowns, sterile gloves, sterile drape, hand hygiene and skin antiseptic. A timeout was performed prior to the initiation of the procedure. Patient was placed prone. CT images of the pelvis were obtained. Left buttock was prepped with chlorhexidine  and sterile field was created. Skin was anesthetized using 1% lidocaine . Small incision was made. Using CT guidance, an 18 gauge trocar needle was directed into the pelvic fluid collection using a transgluteal approach. Bloody fluid was aspirated. Superstiff Amplatz wire was advanced into the collection. The tract was dilated to accommodate a 10 French multipurpose drain. 15 mL of bloody fluid was aspirated from the collection. Drain was attached to a suction bulb and sutured to skin. FINDINGS: Drain was placed into the pelvic fluid collection using a left transgluteal approach. 15 mL of bloody fluid was aspirated. IMPRESSION: CT-guided placement of a drainage catheter in the pelvic fluid collection. Electronically Signed   By: Juliene Balder M.D.   On: 02/25/2024 16:54    Anti-infectives: Anti-infectives (From admission, onward)    Start     Dose/Rate Route Frequency Ordered Stop   02/23/24 2000  piperacillin -tazobactam (ZOSYN ) IVPB 3.375 g        3.375 g 12.5  mL/hr over 240 Minutes Intravenous Every 8 hours 02/23/24 1848     02/18/24 2000  piperacillin -tazobactam (ZOSYN ) IVPB 3.375 g        3.375 g 12.5 mL/hr over 240 Minutes Intravenous  Every 8 hours 02/18/24 1400 02/23/24 1646   02/17/24 2200  piperacillin -tazobactam (ZOSYN ) IVPB 3.375 g        3.375 g 12.5 mL/hr over 240 Minutes Intravenous Every 8 hours 02/17/24 1708 02/18/24 1652   02/17/24 0600  piperacillin -tazobactam (ZOSYN ) IVPB 3.375 g  Status:  Discontinued        3.375 g 12.5 mL/hr over 240 Minutes Intravenous Every 8 hours 02/17/24 0025 02/17/24 1708   02/16/24 2145  piperacillin -tazobactam (ZOSYN ) IVPB 3.375 g        3.375 g 100 mL/hr over 30 Minutes Intravenous  Once 02/16/24 2137 02/16/24 2232       Assessment/Plan: POD 10 lap chole- EW PPD 6 ERCP for leak -regular diet -monitor JP drainage -continue abx, change to orals as he can go home today with drains -drain in 21/31, wbc better -lovenox  -will have follow up with me/surgery and will need IR to setup follow up and anything with their drain -discussed dc instructions. Discussed what to call for.  All questions asked and answered    Camellia Blush 02/27/2024

## 2024-02-27 NOTE — Progress Notes (Signed)
 Patient has been given discharge medications and AVS, and has been discharged

## 2024-02-27 NOTE — Progress Notes (Signed)
 Discharge med in  a secure bag delivered to patient by this RN

## 2024-02-27 NOTE — Discharge Summary (Signed)
 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 He states the pain resolved, but for the last week he has been having nausea and unable to eat. He has been drinking though and able to keep it down. Pt was found to have acute cholecystitis (no stones seen on CT. His WBC was 18.0 in ED. General surgery consulted and pt taken to surgery for lap chole on 02/17/24.  He developed a CBD stone and a bile leak which were found on ERCP on 12/27.  Repeat CT of the abd/pelvis revealed a fluid collection in the R pelvis.  12/31 IR guided drain placement   Subjective:  He has no complaints.    Assessment and Plan: Principal Problem:   Acute severe gangrenous calculous cholecystitis with complications - complicated by CBD stone, bile leak and pelvic abscess - now with 2 drains (RUQ and pelvic) - WBC count is improving and down to 11 today- he is feeling well - General surgery and IR plan to follow as outpatient - switching from Zosyn  to Augmentin  today for 10 more days   Active Problems:  H/o MSSA prosthetic shoiulder joint infection - ok to hold Cefadroxil  while on Augmentin for above- touched base with ID/ Dr Fleeta Rothman today regarding this - has has a f/u on 1/13 with ID   Thrombocytosis - likely due to above - follow     Hypokalemia - replaced     Hyponatremia   Dehydration - given IVF               Discharge Instructions   Allergies as of 02/27/2024       Reactions   Hydrocodone -acetaminophen  Nausea And Vomiting   Irbesartan     Tingling, swelling Pt unaware of allergy reaction    Percocet [oxycodone -acetaminophen ] Nausea And Vomiting   Shingrix [zoster Vac Recomb Adjuvanted] Itching        Medication List     TAKE these medications    ALPRAZolam  0.5 MG tablet Commonly known as: XANAX  Take 1 tablet (0.5 mg total) by mouth 3 (three) times daily as needed for anxiety.   amLODipine  5 MG tablet Commonly known as: NORVASC  TAKE 1 TABLET(5 MG) BY MOUTH DAILY   amoxicillin-clavulanate 875-125 MG tablet Commonly known as: AUGMENTIN Take 1 tablet by mouth 2 (two) times daily for 7 days.   cefadroxil  500 MG capsule Commonly known as: DURICEF Take 2 capsules (1,000 mg total) by mouth 2 (two) times daily.   ELDERBERRY PO Take 1 tablet by mouth daily.   Fish Oil 1000 MG Cpdr Take 2,000 mg by  mouth daily.   Garlic 1000 MG Caps Take 1,000 mg by mouth daily.   hydrochlorothiazide  25 MG tablet Commonly known as: HYDRODIURIL  TAKE 1 TABLET(25 MG) BY MOUTH DAILY What changed: See the new instructions.   Magnesium 250 MG Tabs Take 250 mg by mouth daily.   metoprolol  succinate 100 MG 24 hr tablet Commonly known as: TOPROL -XL TAKE 1 TABLET BY MOUTH EVERY DAY WITH OR IMMEDIATELY FOLLOWING A MEAL   multivitamin tablet Take 1 tablet by mouth daily. Adult 50+   vitamin C 250 MG tablet Commonly known as: ASCORBIC ACID Take 250 mg by mouth daily.        Follow-up Information     Tanda Locus,  MD. Go on 03/17/2024.   Specialty: General Surgery Why: arrive no later than 8:15 AM for postop appt Contact information: [address] [place] [address] [phone]                    The results of significant diagnostics from this hospitalization (including imaging, microbiology, ancillary and laboratory) are listed below for reference.    CT GUIDED PERITONEAL/RETROPERITONEAL FLUID DRAIN BY PERC CATH Result Date: 02/25/2024 INDICATION: 69 year old with a postoperative pelvic fluid collection. EXAM: CT-GUIDED PELVIC DRAIN PLACEMENT TECHNIQUE: Multidetector CT imaging of the pelvis was performed following the standard protocol without IV contrast. RADIATION DOSE REDUCTION: This exam was performed according to the departmental dose-optimization program which includes automated exposure control, adjustment of the mA and/or kV according to patient size and/or use of iterative reconstruction technique. MEDICATIONS: Moderate sedation ANESTHESIA/SEDATION: Moderate (conscious) sedation was employed during this procedure. A total of Versed  2 mg and Fentanyl  150 mcg was administered intravenously by the radiology nurse. Total intra-service moderate Sedation Time: 25 minutes. The patient's level of consciousness and vital signs were monitored continuously by radiology nursing throughout the procedure under my direct supervision. COMPLICATIONS: None immediate. PROCEDURE: Informed written consent was obtained from the patient after a thorough discussion of the procedural risks, benefits and alternatives. All questions were addressed. Maximal Sterile Barrier Technique was utilized including caps, mask, sterile gowns, sterile gloves, sterile drape, hand hygiene and skin antiseptic. A timeout was performed prior to the initiation of the procedure. Patient was placed prone. CT images of the pelvis were obtained. Left buttock was prepped with chlorhexidine  and sterile field was created.  Skin was anesthetized using 1% lidocaine . Small incision was made. Using CT guidance, an 18 gauge trocar needle was directed into the pelvic fluid collection using a transgluteal approach. Bloody fluid was aspirated. Superstiff Amplatz wire was advanced into the collection. The tract was dilated to accommodate a 10 French multipurpose drain. 15 mL of bloody fluid was aspirated from the collection. Drain was attached to a suction bulb and sutured to skin. FINDINGS: Drain was placed into the pelvic fluid collection using a left transgluteal approach. 15 mL of bloody fluid was aspirated. IMPRESSION: CT-guided placement of a drainage catheter in the pelvic fluid collection. Electronically Signed   By: Juliene Balder M.D.   On: 02/25/2024 16:54   DG ERCP Result Date: 02/25/2024 CLINICAL DATA:  History of bile leak EXAM: ERCP 13 fluoroscopic images of the right upper quadrant and 4 cine loops TECHNIQUE: Multiple spot images obtained with the fluoroscopic device and submitted for interpretation post-procedure. FLUOROSCOPY: Radiation Exposure Index (as provided by the fluoroscopic device): 64.8 mGy Kerma COMPARISON:  None Available. FINDINGS: Images demonstrate flexible endoscopy device with guidewire advanced into the common bile duct. Surgical clips  in the right upper quadrant consistent with previous cholecystectomy. Mild-to-moderate dilatation of the common bile duct after contrast injection. Contrast can be seen flowing into the cystic duct and then extravasated into the gallbladder fossa. Interval placement of plastic common bile duct stent. IMPRESSION: Bile duct leak status post cholecystectomy. ERCP with interval placement of plastic common bile duct stent. These images were submitted for radiologic interpretation only. Please see the procedural report for the amount of contrast and the fluoroscopy time utilized. Electronically Signed   By: Cordella Banner   On: 02/25/2024 10:06   CT ABDOMEN PELVIS W  CONTRAST Result Date: 02/25/2024 EXAM: CT ABDOMEN AND PELVIS WITH CONTRAST 02/24/2024 02:45:34 PM TECHNIQUE: CT of the abdomen and pelvis was performed with the administration of 100 mL of iohexol  (OMNIPAQUE ) 300 MG/ML solution. Multiplanar reformatted images are provided for review. Automated exposure control, iterative reconstruction, and/or weight-based adjustment of the mA/kV was utilized to reduce the radiation dose to as low as reasonably achievable. COMPARISON: CT abdomen and pelvis from 02/20/2024 and CT angio chest 02/16/2024. CLINICAL HISTORY: Abdominal pain, post-op. FINDINGS: LOWER CHEST: Aortic atherosclerosis and coronary artery calcifications. Trace anterior inferior pericardial fluid. Small bilateral pleural effusions are new from the previous exam. Atelectasis is identified within the posterior basal and lateral basal right lower lobe and posterior left base. Emphysema with diffuse bronchial wall thickening. The central airways are patent. Remote healed right posterior rib fractures. Thoracic degenerative disc disease. LIVER: There is a surgical drainage catheter which entered from a right lateral abdominal wall approach and courses through the gallbladder fossa and terminates along the falciform ligament. Previous loculated fluid collection containing gas has decreased in volume from the prior exam. Currently, this measures 4.2 x 2.6 x 2.1 cm, axial 65/3. On the previous examination, using the same measurement scheme as today, this fluid collection measured 9.3 x 3.0 x 4.9 cm. Similar appearance of inflammatory changes including soft tissue stranding and free fluid within the right upper quadrant of the abdomen. GALLBLADDER AND BILE DUCTS: Status post cholecystectomy. Mild pneumobilia. Interval placement of a common bile duct stent. No significant bile duct dilatation. SPLEEN: No acute abnormality. PANCREAS: No acute abnormality. ADRENAL GLANDS: No acute abnormality. KIDNEYS, URETERS AND BLADDER:  2.3 cm Bosniak class 1 cyst arises off the lateral left kidney. Per consensus, no follow-up is needed for simple Bosniak type 1 and 2 renal cysts, unless the patient has a malignancy history or risk factors. No stones in the kidneys or ureters. No hydronephrosis. No perinephric or periureteral stranding. Urinary bladder is unremarkable. GI AND BOWEL: Stomach demonstrates no acute abnormality. Persistent increased caliber of small bowel loops with multiple air-fluid levels, which measure up to 3.2 cm. Distal small bowel loops are decreased in caliber, and there is a normal caliber colon. Sigmoid diverticulosis is identified. Interval development of mild wall thickening with surrounding hyperemia of the vasorectal displacement of the sigmoid mesenteric fat suggestive of diverticulitis versus colitis. This is seen on axial image 104/3. There is no bowel obstruction. PERITONEUM AND RETROPERITONEUM: A maturing fluid collection within the pelvis with signs of early neural enhancement/loculation measuring 6.4 x 5.6 x 3.5 cm. No ascites. No free air. VASCULATURE: Aortic atherosclerotic calcification. Aorta is normal in caliber. LYMPH NODES: No abdominal or pelvic adenopathy. REPRODUCTIVE ORGANS: No acute abnormality. BONES AND SOFT TISSUES: Lumbar spondylosis. No acute osseous abnormality. No focal soft tissue abnormality. IMPRESSION: 1. Decreased volume of the previous loculated fluid collection containing gas in the right upper quadrant, now measuring 4.2  x 2.6 x 2.1 cm. 2. Maturing fluid collection within the pelvis with early rim enhancement/loculation, measuring 6.4 x 5.6 x 3.5 cm. 3. Persistent increased caliber of small bowel loops with multiple air-fluid levels, measuring up to 3.2 cm. Differential considerations include postoperative ileus versus distal bowel obstruction. 4. Interval development of mild sigmoid wall thickening with surrounding hyperemia and mesenteric fat stranding, suggestive of diverticulitis  versus colitis. 5. Status post cholecystectomy with interval placement of a common bile duct stent, without significant bile duct dilatation and with mild pneumobilia. Electronically signed by: Waddell Calk MD 02/25/2024 06:58 AM EST RP Workstation: [id number]   CT CHEST W CONTRAST Result Date: 02/25/2024 EXAM: CT ABDOMEN AND PELVIS WITH CONTRAST 02/24/2024 02:45:34 PM TECHNIQUE: CT of the abdomen and pelvis was performed with the administration of 100 mL of iohexol  (OMNIPAQUE ) 300 MG/ML solution. Multiplanar reformatted images are provided for review. Automated exposure control, iterative reconstruction, and/or weight-based adjustment of the mA/kV was utilized to reduce the radiation dose to as low as reasonably achievable. COMPARISON: CT abdomen and pelvis from 02/20/2024 and CT angio chest 02/16/2024. CLINICAL HISTORY: Abdominal pain, post-op. FINDINGS: LOWER CHEST: Aortic atherosclerosis and coronary artery calcifications. Trace anterior inferior pericardial fluid. Small bilateral pleural effusions are new from the previous exam. Atelectasis is identified within the posterior basal and lateral basal right lower lobe and posterior left base. Emphysema with diffuse bronchial wall thickening. The central airways are patent. Remote healed right posterior rib fractures. Thoracic degenerative disc disease. LIVER: There is a surgical drainage catheter which entered from a right lateral abdominal wall approach and courses through the gallbladder fossa and terminates along the falciform ligament. Previous loculated fluid collection containing gas has decreased in volume from the prior exam. Currently, this measures 4.2 x 2.6 x 2.1 cm, axial 65/3. On the previous examination, using the same measurement scheme as today, this fluid collection measured 9.3 x 3.0 x 4.9 cm. Similar appearance of inflammatory changes including soft tissue stranding and free fluid within the right upper quadrant of the abdomen. GALLBLADDER  AND BILE DUCTS: Status post cholecystectomy. Mild pneumobilia. Interval placement of a common bile duct stent. No significant bile duct dilatation. SPLEEN: No acute abnormality. PANCREAS: No acute abnormality. ADRENAL GLANDS: No acute abnormality. KIDNEYS, URETERS AND BLADDER: 2.3 cm Bosniak class 1 cyst arises off the lateral left kidney. Per consensus, no follow-up is needed for simple Bosniak type 1 and 2 renal cysts, unless the patient has a malignancy history or risk factors. No stones in the kidneys or ureters. No hydronephrosis. No perinephric or periureteral stranding. Urinary bladder is unremarkable. GI AND BOWEL: Stomach demonstrates no acute abnormality. Persistent increased caliber of small bowel loops with multiple air-fluid levels, which measure up to 3.2 cm. Distal small bowel loops are decreased in caliber, and there is a normal caliber colon. Sigmoid diverticulosis is identified. Interval development of mild wall thickening with surrounding hyperemia of the vasorectal displacement of the sigmoid mesenteric fat suggestive of diverticulitis versus colitis. This is seen on axial image 104/3. There is no bowel obstruction. PERITONEUM AND RETROPERITONEUM: A maturing fluid collection within the pelvis with signs of early neural enhancement/loculation measuring 6.4 x 5.6 x 3.5 cm. No ascites. No free air. VASCULATURE: Aortic atherosclerotic calcification. Aorta is normal in caliber. LYMPH NODES: No abdominal or pelvic adenopathy. REPRODUCTIVE ORGANS: No acute abnormality. BONES AND SOFT TISSUES: Lumbar spondylosis. No acute osseous abnormality. No focal soft tissue abnormality. IMPRESSION: 1. Decreased volume of the previous loculated fluid collection containing gas in  the right upper quadrant, now measuring 4.2 x 2.6 x 2.1 cm. 2. Maturing fluid collection within the pelvis with early rim enhancement/loculation, measuring 6.4 x 5.6 x 3.5 cm. 3. Persistent increased caliber of small bowel loops with  multiple air-fluid levels, measuring up to 3.2 cm. Differential considerations include postoperative ileus versus distal bowel obstruction. 4. Interval development of mild sigmoid wall thickening with surrounding hyperemia and mesenteric fat stranding, suggestive of diverticulitis versus colitis. 5. Status post cholecystectomy with interval placement of a common bile duct stent, without significant bile duct dilatation and with mild pneumobilia. Electronically signed by: Waddell Calk MD 02/25/2024 06:58 AM EST RP Workstation: [id number]   DG CHEST PORT 1 VIEW Result Date: 02/22/2024 CLINICAL DATA:  Shortness of breath. EXAM: PORTABLE CHEST 1 VIEW COMPARISON:  02/18/2024 FINDINGS: Normal heart size with stable mediastinal contours. Slight increase in atelectasis at the lung bases. No pulmonary edema, large pleural effusion or pneumothorax. Reverse left shoulder arthroplasty. IMPRESSION: Slight increase in atelectasis at the lung bases. Electronically Signed   By: Andrea Gasman M.D.   On: 02/22/2024 10:51   CT ABDOMEN PELVIS WO CONTRAST Result Date: 02/20/2024 EXAM: CT ABDOMEN AND PELVIS WITHOUT CONTRAST 02/20/2024 01:22:00 PM TECHNIQUE: CT of the abdomen and pelvis was performed without the administration of intravenous contrast. Multiplanar reformatted images are provided for review. Automated exposure control, iterative reconstruction, and/or weight-based adjustment of the mA/kV was utilized to reduce the radiation dose to as low as reasonably achievable. COMPARISON: HIDA scan dated 02/20/2024. CLINICAL HISTORY: Abdominal pain, post-op.Postoperative cholecystectomy day 3. FINDINGS: LOWER CHEST: Mild bibasilar atelectasis. LIVER: The liver is unremarkable. GALLBLADDER AND BILE DUCTS: Collection in the gallbladder fossa measures 6.1 x 4.8 cm. The collection is low density. Percutaneous drain extends through the fluid collection. Cholecystectomy clips in the region of the cystic duct. There is no biliary  ductal dilatation. The common bile duct is normal caliber. SPLEEN: No acute abnormality. PANCREAS: No evidence of pancreatitis on noncontrast exam. ADRENAL GLANDS: No acute abnormality. KIDNEYS, URETERS AND BLADDER: No stones in the kidneys or ureters. No hydronephrosis. No perinephric or periureteral stranding. Urinary bladder is unremarkable. GI AND BOWEL: Stomach is normal. Duodenum appears normal. The bowel is fluid filled with air-fluid levels suggesting postoperative ileus. No pneumatosis. Stool in the rectum. PERITONEUM AND RETROPERITONEUM: Small amount of scattered gas in the upper peritoneal space related to recent laparoscopic surgery. Small amount of high density fluid collects dependently within the posterior cul de sac with a fluid-fluid level. Small volume hemoperitoneum presumably related to recent surgery. VASCULATURE: Aorta is normal in caliber. LYMPH NODES: No lymphadenopathy. REPRODUCTIVE ORGANS: No acute abnormality. BONES AND SOFT TISSUES: No acute osseous abnormality. No focal soft tissue abnormality. IMPRESSION: 1. Collection in the gallbladder fossa measuring 6.1 x 4.8 cm with percutaneous drain in place. Findings consistent with predominantly contained bowel gas. 2. Postoperative ileus. 3. Small volume hemoperitoneum complex in the posterior cul de sac , presumably related to recent surgery. 4. These results will be called to the ordering clinician or representative by the radiologist assistant, and communication documented in the pacs or clario dashboard Electronically signed by: Norleen Boxer MD 02/20/2024 03:59 PM EST RP Workstation: [id number]   DG Abd Portable 1V Result Date: 02/20/2024 CLINICAL DATA:  Abdominal distension. EXAM: PORTABLE ABDOMEN - 1 VIEW COMPARISON:  02/18/2024 FINDINGS: Increasing gaseous distention of bowel loops in the central abdomen, primarily colonic distension, however prominent air-filled loops of small bowel are also seen. Drain in the right mid abdomen.  Right  upper quadrant surgical clips. IMPRESSION: Increasing gaseous distention of bowel loops in the central abdomen, primarily colonic distension, however prominent air-filled loops of small bowel are also seen. Favor generalized ileus. Electronically Signed   By: Andrea Gasman M.D.   On: 02/20/2024 14:05   NM HEPATOBILIARY LEAK (POST-SURGICAL) Result Date: 02/20/2024 EXAM: NM HEPATOBILLARY SCAN 02/20/2024 12:50:37 PM TECHNIQUE: RADIOPHARMACEUTICAL: 5.4 mCi Tc-41m mebrofenin  Dynamic images of the abdomen and pelvis were obtained in the anterior projection for 1 hour after intravenous administration of radiopharmaceutical. The biliary drain was clamped for the second hour of the exam and imaging continued for at least 30 minutes. COMPARISON: None available. CLINICAL HISTORY: Abdominal pain, post surgery or trauma, bile leak suspected. FINDINGS: Homogenous uptake within the liver. Normal clearance of the blood pool. Initial imaging demonstrates excreted radiotracer in the common hepatic duct with evidence of radiotracer within the biliary drain. The drain was clamped for the second hour of the exam. With the drain clamped, extra biliary radiotracer collects within the gallbladder fossa. Findings consistent with contained bowel leak within the gallbladder fossa. Radiotracer evacuating through the biliary drain when unclamped. No activity in the small bowel. IMPRESSION: 1. Radiotracer evacuates through the biliary drain when unclamped. 2. With drain clamped, extra biliary radiotracer collects within the gallbladder fossa. 3. Findings consistent with biliary leak . No activity in the small bowel suggests brisk leak. 4. These results will be called to the ordering clinician or representative by the radiologist assistant, and communication documented in the pacs or clario dashboard Electronically signed by: Norleen Boxer MD 02/20/2024 01:11 PM EST RP Workstation: [id number]   MR ABDOMEN MRCP WO CONTRAST Result  Date: 02/19/2024 CLINICAL DATA:  Postop day 1 from laparoscopic cholecystectomy. Elevated LFTs. Clinical concern for retained common bile duct stone. EXAM: MRI ABDOMEN WITHOUT CONTRAST  (INCLUDING MRCP) TECHNIQUE: Multiplanar multisequence MR imaging of the abdomen was performed. Heavily T2-weighted images of the biliary and pancreatic ducts were obtained, and three-dimensional MRCP images were rendered by post processing. COMPARISON:  Abdomen and pelvis CT 02/16/2024 FINDINGS: Lower chest: Dependent atelectasis with tiny right pleural effusion. Hepatobiliary: No suspicious focal abnormality in the liver on this noncontrast study. There is some periportal edema with edema in the inferior liver around the gallbladder fossa. Heterogeneous signal intensity in the gallbladder fossa likely reflects a combination of postoperative fluid/blood products in packing material. No substantial intrahepatic biliary duct dilatation. Common duct measures 7 mm diameter in the porta hepatis. Common bile duct just proximal to the ampulla is 7 mm diameter. There is no evidence for choledocholithiasis. Pancreas: No focal mass lesion. No dilatation of the main duct. No intraparenchymal cyst. No peripancreatic edema. Edema around the duodenum and pancreatic head is 2 compatible with recent surgery. Spleen:  No splenomegaly. No suspicious focal mass lesion. Adrenals/Urinary Tract: No adrenal nodule or mass. Bilateral renal cysts evident. Stomach/Bowel: Stomach is moderately distended with fluid. Small bowel and colon shows mild diffuse gaseous distension. Vascular/Lymphatic: No abdominal aortic aneurysm small retroperitoneal lymph nodes are similar to recent CT scan. Other: Edema in the anterior abdominal wall is compatible with the recent surgical access. There is dependent subcutaneous body wall edema bilaterally bilaterally. Musculoskeletal: No overtly suspicious marrow signal abnormality. IMPRESSION: 1. Postop day 1 from  cholecystectomy. Heterogeneous signal intensity in the gallbladder fossa likely reflects a combination of postoperative fluid/blood products and packing material. 2. No substantial intrahepatic biliary duct dilatation. Common duct measures 7 mm diameter in the porta hepatis and just proximal to the ampulla. No  evidence for choledocholithiasis. 3. Mild diffuse gaseous distension of the small bowel and colon compatible with mild postoperative ileus. 4. Dependent atelectasis with tiny right pleural effusion. Electronically Signed   By: Camellia Candle M.D.   On: 02/19/2024 05:32   DG Abd Portable 1V Result Date: 02/18/2024 CLINICAL DATA:  Distension postop cholecystostomy EXAM: PORTABLE ABDOMEN - 1 VIEW COMPARISON:  CT 02/16/2024 FINDINGS: Postop changes in the right upper quadrant with clips and probable drainage catheter. Mild increased small and large bowel gas without obstruction. Moderate stool IMPRESSION: Mild increased small and large bowel gas without obstruction, possible mild ileus. Moderate stool. Electronically Signed   By: Luke Bun M.D.   On: 02/18/2024 15:59   DG Chest Port 1 View Result Date: 02/18/2024 CLINICAL DATA:  Shortness of breath EXAM: PORTABLE CHEST 1 VIEW COMPARISON:  02/16/2024 FINDINGS: Hypoventilatory change. Minimal atelectasis right base. No consolidation, pleural effusion or pneumothorax. Normal cardiac size. Aortic atherosclerosis. Left shoulder replacement IMPRESSION: Hypoventilatory changes with minimal atelectasis at the right base. Electronically Signed   By: Luke Bun M.D.   On: 02/18/2024 15:58   CT ABDOMEN PELVIS W CONTRAST Result Date: 02/16/2024 EXAM: CT ABDOMEN AND PELVIS WITH CONTRAST 02/16/2024 07:57:25 PM TECHNIQUE: CT of the abdomen and pelvis was performed with the administration of 100 mL of iohexol  (OMNIPAQUE ) 350 MG/ML injection. Multiplanar reformatted images are provided for review. Automated exposure control, iterative reconstruction, and/or  weight-based adjustment of the mA/kV was utilized to reduce the radiation dose to as low as reasonably achievable. COMPARISON: None available. CLINICAL HISTORY: Epigastric pain; cp, epigastric abd pain, leukocytosis. FINDINGS: LOWER CHEST: No acute abnormality. LIVER: Hepatic steatosis. GALLBLADDER AND BILE DUCTS: Distended gallbladder with wall thickening and pericholecystic fluid compatible with acute cholecystitis. No radiopaque stone or biliary ductal dilatation. SPLEEN: No acute abnormality. PANCREAS: No acute abnormality. ADRENAL GLANDS: No acute abnormality. KIDNEYS, URETERS AND BLADDER: No stones in the kidneys or ureters. No hydronephrosis. No perinephric or periureteral stranding. Urinary bladder is unremarkable. GI AND BOWEL: Stomach demonstrates no acute abnormality. Colonic diverticulosis without evidence of diverticulitis. There is no bowel obstruction. PERITONEUM AND RETROPERITONEUM: No ascites. No free air. VASCULATURE: Aorta is normal in caliber. Aortic atherosclerotic calcification. LYMPH NODES: No lymphadenopathy. REPRODUCTIVE ORGANS: No acute abnormality. BONES AND SOFT TISSUES: No acute osseous abnormality. No focal soft tissue abnormality. IMPRESSION: 1. Acute cholecystitis. Electronically signed by: Norman Gatlin MD 02/16/2024 08:10 PM EST RP Workstation: [id number]   CT Angio Chest PE W and/or Wo Contrast Result Date: 02/16/2024 EXAM: CTA CHEST 02/16/2024 07:57:25 PM TECHNIQUE: CTA of the chest was performed with the administration of 100 mL of iohexol  (OMNIPAQUE ) 350 MG/ML injection. Multiplanar reformatted images are provided for review. MIP images are provided for review. Automated exposure control, iterative reconstruction, and/or weight based adjustment of the mA/kV was utilized to reduce the radiation dose to as low as reasonably achievable. COMPARISON: Comparison with the same day x-ray. CLINICAL HISTORY: Pulmonary embolism (PE) suspected, high prob; cough, cp, abd pain. FINDINGS:  PULMONARY ARTERIES: Pulmonary arteries are adequately opacified for evaluation. No acute pulmonary embolus. Main pulmonary artery is normal in caliber. MEDIASTINUM: Coronary artery and aortic atherosclerotic calcification. The heart and pericardium demonstrate no acute abnormality. There is no acute abnormality of the thoracic aorta. LYMPH NODES: No mediastinal, hilar or axillary lymphadenopathy. LUNGS AND PLEURA: Emphysema. Right lower lobe atelectasis. No evidence of pleural effusion or pneumothorax. UPPER ABDOMEN: Hepatic steatosis. SOFT TISSUES AND BONES: No acute bone or soft tissue abnormality. IMPRESSION: 1. No pulmonary embolism.  2. Emphysema. Pulmonary emphysema is an independent risk factor for lung cancer. Recommend consideration for evaluation for a low-dose CT lung cancer screening program. Electronically signed by: Norman Gatlin MD 02/16/2024 08:07 PM EST RP Workstation: [id number]   DG Chest Portable 1 View Result Date: 02/16/2024 CLINICAL DATA:  Cough. EXAM: PORTABLE CHEST 1 VIEW COMPARISON:  05/11/2021. FINDINGS: The heart size and mediastinal contours are within normal limits. There is elevation of the right diaphragm. Atelectasis is noted at the lung bases bilaterally. No effusion or pneumothorax is seen. 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 IMPRESSION: Elevation of the right diaphragm with atelectasis at the lung bases. Electronically Signed   By: Leita Birmingham M.D.   On: 02/16/2024 17:48   Labs:   Basic Metabolic Panel: Recent Labs  Lab 02/21/24 0235 02/22/24 0300 02/24/24 0350 02/25/24 0355 02/26/24 0355  NA 141 139 135 134* 134*  K 3.5 3.6 3.5 3.1* 4.0  CL 104 102 101 99 99  CO2 23 23 22 23  21*  GLUCOSE 120* 132* 134* 144* 126*  BUN 22 19 9 9 8   CREATININE 0.59* 0.72 0.71 0.70 0.78  CALCIUM 9.1 8.8* 9.3 9.3 9.3     CBC: Recent Labs  Lab 02/23/24 0250 02/24/24 0350 02/25/24 0355 02/26/24 0355 02/27/24 0316  WBC 14.4* 23.5* 20.7* 14.3* 11.5*   NEUTROABS  --   --   --  11.7*  --   HGB 9.5* 11.2* 10.6* 10.3* 10.8*  HCT 30.0* 33.9* 32.2* 31.9* 33.9*  MCV 92.6 89.9 89.2 90.4 90.6  PLT 402* 489* 495* 450* 642*         SIGNED:   True Atlas, MD  [facility] 02/27/2024, 11:34 AM Time taking on discharge: 50 minutes

## 2024-02-27 NOTE — Progress Notes (Signed)
 "   Referring Physician(s): Wilson,E  Supervising Physician: Jenna Hacker  Patient Status:  [name] - In-pt  Chief Complaint: post operative pelvic fluid collection, lap chole on 02/17/24 ; s/p pelvic drain on 12/31   Subjective: Pt has some soreness as expected left TG drain site; denies fever,N/V; plans noted for possible dc home today   Allergies: Hydrocodone -acetaminophen , Irbesartan , Percocet [oxycodone -acetaminophen ], and Shingrix [zoster vac recomb adjuvanted]  Medications: Prior to Admission medications  Medication Sig Start Date End Date Taking? Authorizing Provider  ALPRAZolam  (XANAX ) 0.5 MG tablet Take 1 tablet (0.5 mg total) by mouth 3 (three) times daily as needed for anxiety. 11/11/23  Yes [name], MD  amLODipine  (NORVASC ) 5 MG tablet TAKE 1 TABLET(5 MG) BY MOUTH DAILY 12/15/23  Yes [name], MD  cefadroxil  (DURICEF) 500 MG capsule Take 2 capsules (1,000 mg total) by mouth 2 (two) times daily. 04/30/23  Yes Fleeta Rothman, Jomarie SAILOR, MD  ELDERBERRY PO Take 1 tablet by mouth daily.   Yes [provider]  Garlic 1000 MG CAPS Take 1,000 mg by mouth daily.   Yes [provider]  hydrochlorothiazide  (HYDRODIURIL ) 25 MG tablet TAKE 1 TABLET(25 MG) BY MOUTH DAILY Patient taking differently: Take 25 mg by mouth daily. 09/18/23  Yes [name], MD  Magnesium 250 MG TABS Take 250 mg by mouth daily.   Yes [provider]  metoprolol  succinate (TOPROL -XL) 100 MG 24 hr tablet TAKE 1 TABLET BY MOUTH EVERY DAY WITH OR IMMEDIATELY FOLLOWING A MEAL 12/16/23  Yes [name], MD  Multiple Vitamin (MULTIVITAMIN) tablet Take 1 tablet by mouth daily. Adult 50+   Yes [provider]  Omega-3 Fatty Acids (FISH OIL) 1000 MG CPDR Take 2,000 mg by mouth daily.   Yes [provider]  vitamin C (ASCORBIC ACID) 250 MG tablet Take 250 mg by mouth daily.   Yes [provider]     Vital Signs: BP (!) 142/74 (BP  Location: Right Arm)   Pulse 76   Temp 98.2 F (36.8 C) (Oral)   Resp 18   Ht 6' 2 (1.88 m)   Wt 227 lb 1.2 oz (103 kg)   SpO2 98%   BMI 29.15 kg/m   Physical Exam awake/alert; left TG drain intact, bandage intact, clean and dry, site mildly tender to palpation; OP 15-20 cc hazy, blood-tinged fluid; drain flushed without difficulty but not sig retrun  Imaging: CT GUIDED PERITONEAL/RETROPERITONEAL FLUID DRAIN BY PERC CATH Result Date: 02/25/2024 INDICATION: 69 year old with a postoperative pelvic fluid collection. EXAM: CT-GUIDED PELVIC DRAIN PLACEMENT TECHNIQUE: Multidetector CT imaging of the pelvis was performed following the standard protocol without IV contrast. RADIATION DOSE REDUCTION: This exam was performed according to the departmental dose-optimization program which includes automated exposure control, adjustment of the mA and/or kV according to patient size and/or use of iterative reconstruction technique. MEDICATIONS: Moderate sedation ANESTHESIA/SEDATION: Moderate (conscious) sedation was employed during this procedure. A total of Versed  2 mg and Fentanyl  150 mcg was administered intravenously by the radiology nurse. Total intra-service moderate Sedation Time: 25 minutes. The patient's level of consciousness and vital signs were monitored continuously by radiology nursing throughout the procedure under my direct supervision. COMPLICATIONS: None immediate. PROCEDURE: Informed written consent was obtained from the patient after a thorough discussion of the procedural risks, benefits and alternatives. All questions were addressed. Maximal Sterile Barrier Technique was utilized including caps, mask, sterile gowns, sterile gloves, sterile drape, hand hygiene and skin antiseptic. A timeout was  performed prior to the initiation of the procedure. Patient was placed prone. CT images of the pelvis were obtained. Left buttock was prepped with chlorhexidine  and sterile field was created. Skin was  anesthetized using 1% lidocaine . Small incision was made. Using CT guidance, an 18 gauge trocar needle was directed into the pelvic fluid collection using a transgluteal approach. Bloody fluid was aspirated. Superstiff Amplatz wire was advanced into the collection. The tract was dilated to accommodate a 10 French multipurpose drain. 15 mL of bloody fluid was aspirated from the collection. Drain was attached to a suction bulb and sutured to skin. FINDINGS: Drain was placed into the pelvic fluid collection using a left transgluteal approach. 15 mL of bloody fluid was aspirated. IMPRESSION: CT-guided placement of a drainage catheter in the pelvic fluid collection. Electronically Signed   By: Juliene Balder M.D.   On: 02/25/2024 16:54   CT ABDOMEN PELVIS W CONTRAST Result Date: 02/25/2024 EXAM: CT ABDOMEN AND PELVIS WITH CONTRAST 02/24/2024 02:45:34 PM TECHNIQUE: CT of the abdomen and pelvis was performed with the administration of 100 mL of iohexol  (OMNIPAQUE ) 300 MG/ML solution. Multiplanar reformatted images are provided for review. Automated exposure control, iterative reconstruction, and/or weight-based adjustment of the mA/kV was utilized to reduce the radiation dose to as low as reasonably achievable. COMPARISON: CT abdomen and pelvis from 02/20/2024 and CT angio chest 02/16/2024. CLINICAL HISTORY: Abdominal pain, post-op. FINDINGS: LOWER CHEST: Aortic atherosclerosis and coronary artery calcifications. Trace anterior inferior pericardial fluid. Small bilateral pleural effusions are new from the previous exam. Atelectasis is identified within the posterior basal and lateral basal right lower lobe and posterior left base. Emphysema with diffuse bronchial wall thickening. The central airways are patent. Remote healed right posterior rib fractures. Thoracic degenerative disc disease. LIVER: There is a surgical drainage catheter which entered from a right lateral abdominal wall approach and courses through the  gallbladder fossa and terminates along the falciform ligament. Previous loculated fluid collection containing gas has decreased in volume from the prior exam. Currently, this measures 4.2 x 2.6 x 2.1 cm, axial 65/3. On the previous examination, using the same measurement scheme as today, this fluid collection measured 9.3 x 3.0 x 4.9 cm. Similar appearance of inflammatory changes including soft tissue stranding and free fluid within the right upper quadrant of the abdomen. GALLBLADDER AND BILE DUCTS: Status post cholecystectomy. Mild pneumobilia. Interval placement of a common bile duct stent. No significant bile duct dilatation. SPLEEN: No acute abnormality. PANCREAS: No acute abnormality. ADRENAL GLANDS: No acute abnormality. KIDNEYS, URETERS AND BLADDER: 2.3 cm Bosniak class 1 cyst arises off the lateral left kidney. Per consensus, no follow-up is needed for simple Bosniak type 1 and 2 renal cysts, unless the patient has a malignancy history or risk factors. No stones in the kidneys or ureters. No hydronephrosis. No perinephric or periureteral stranding. Urinary bladder is unremarkable. GI AND BOWEL: Stomach demonstrates no acute abnormality. Persistent increased caliber of small bowel loops with multiple air-fluid levels, which measure up to 3.2 cm. Distal small bowel loops are decreased in caliber, and there is a normal caliber colon. Sigmoid diverticulosis is identified. Interval development of mild wall thickening with surrounding hyperemia of the vasorectal displacement of the sigmoid mesenteric fat suggestive of diverticulitis versus colitis. This is seen on axial image 104/3. There is no bowel obstruction. PERITONEUM AND RETROPERITONEUM: A maturing fluid collection within the pelvis with signs of early neural enhancement/loculation measuring 6.4 x 5.6 x 3.5 cm. No ascites. No free air. VASCULATURE: Aortic  atherosclerotic calcification. Aorta is normal in caliber. LYMPH NODES: No abdominal or pelvic  adenopathy. REPRODUCTIVE ORGANS: No acute abnormality. BONES AND SOFT TISSUES: Lumbar spondylosis. No acute osseous abnormality. No focal soft tissue abnormality. IMPRESSION: 1. Decreased volume of the previous loculated fluid collection containing gas in the right upper quadrant, now measuring 4.2 x 2.6 x 2.1 cm. 2. Maturing fluid collection within the pelvis with early rim enhancement/loculation, measuring 6.4 x 5.6 x 3.5 cm. 3. Persistent increased caliber of small bowel loops with multiple air-fluid levels, measuring up to 3.2 cm. Differential considerations include postoperative ileus versus distal bowel obstruction. 4. Interval development of mild sigmoid wall thickening with surrounding hyperemia and mesenteric fat stranding, suggestive of diverticulitis versus colitis. 5. Status post cholecystectomy with interval placement of a common bile duct stent, without significant bile duct dilatation and with mild pneumobilia. Electronically signed by: Waddell Calk MD 02/25/2024 06:58 AM EST RP Workstation: [id number]   CT CHEST W CONTRAST Result Date: 02/25/2024 EXAM: CT ABDOMEN AND PELVIS WITH CONTRAST 02/24/2024 02:45:34 PM TECHNIQUE: CT of the abdomen and pelvis was performed with the administration of 100 mL of iohexol  (OMNIPAQUE ) 300 MG/ML solution. Multiplanar reformatted images are provided for review. Automated exposure control, iterative reconstruction, and/or weight-based adjustment of the mA/kV was utilized to reduce the radiation dose to as low as reasonably achievable. COMPARISON: CT abdomen and pelvis from 02/20/2024 and CT angio chest 02/16/2024. CLINICAL HISTORY: Abdominal pain, post-op. FINDINGS: LOWER CHEST: Aortic atherosclerosis and coronary artery calcifications. Trace anterior inferior pericardial fluid. Small bilateral pleural effusions are new from the previous exam. Atelectasis is identified within the posterior basal and lateral basal right lower lobe and posterior left base. Emphysema  with diffuse bronchial wall thickening. The central airways are patent. Remote healed right posterior rib fractures. Thoracic degenerative disc disease. LIVER: There is a surgical drainage catheter which entered from a right lateral abdominal wall approach and courses through the gallbladder fossa and terminates along the falciform ligament. Previous loculated fluid collection containing gas has decreased in volume from the prior exam. Currently, this measures 4.2 x 2.6 x 2.1 cm, axial 65/3. On the previous examination, using the same measurement scheme as today, this fluid collection measured 9.3 x 3.0 x 4.9 cm. Similar appearance of inflammatory changes including soft tissue stranding and free fluid within the right upper quadrant of the abdomen. GALLBLADDER AND BILE DUCTS: Status post cholecystectomy. Mild pneumobilia. Interval placement of a common bile duct stent. No significant bile duct dilatation. SPLEEN: No acute abnormality. PANCREAS: No acute abnormality. ADRENAL GLANDS: No acute abnormality. KIDNEYS, URETERS AND BLADDER: 2.3 cm Bosniak class 1 cyst arises off the lateral left kidney. Per consensus, no follow-up is needed for simple Bosniak type 1 and 2 renal cysts, unless the patient has a malignancy history or risk factors. No stones in the kidneys or ureters. No hydronephrosis. No perinephric or periureteral stranding. Urinary bladder is unremarkable. GI AND BOWEL: Stomach demonstrates no acute abnormality. Persistent increased caliber of small bowel loops with multiple air-fluid levels, which measure up to 3.2 cm. Distal small bowel loops are decreased in caliber, and there is a normal caliber colon. Sigmoid diverticulosis is identified. Interval development of mild wall thickening with surrounding hyperemia of the vasorectal displacement of the sigmoid mesenteric fat suggestive of diverticulitis versus colitis. This is seen on axial image 104/3. There is no bowel obstruction. PERITONEUM AND  RETROPERITONEUM: A maturing fluid collection within the pelvis with signs of early neural enhancement/loculation measuring 6.4 x 5.6 x 3.5  cm. No ascites. No free air. VASCULATURE: Aortic atherosclerotic calcification. Aorta is normal in caliber. LYMPH NODES: No abdominal or pelvic adenopathy. REPRODUCTIVE ORGANS: No acute abnormality. BONES AND SOFT TISSUES: Lumbar spondylosis. No acute osseous abnormality. No focal soft tissue abnormality. IMPRESSION: 1. Decreased volume of the previous loculated fluid collection containing gas in the right upper quadrant, now measuring 4.2 x 2.6 x 2.1 cm. 2. Maturing fluid collection within the pelvis with early rim enhancement/loculation, measuring 6.4 x 5.6 x 3.5 cm. 3. Persistent increased caliber of small bowel loops with multiple air-fluid levels, measuring up to 3.2 cm. Differential considerations include postoperative ileus versus distal bowel obstruction. 4. Interval development of mild sigmoid wall thickening with surrounding hyperemia and mesenteric fat stranding, suggestive of diverticulitis versus colitis. 5. Status post cholecystectomy with interval placement of a common bile duct stent, without significant bile duct dilatation and with mild pneumobilia. Electronically signed by: Waddell Calk MD 02/25/2024 06:58 AM EST RP Workstation: [id number]    Labs:  CBC: Recent Labs    02/24/24 0350 02/25/24 0355 02/26/24 0355 02/27/24 0316  WBC 23.5* 20.7* 14.3* 11.5*  HGB 11.2* 10.6* 10.3* 10.8*  HCT 33.9* 32.2* 31.9* 33.9*  PLT 489* 495* 450* 642*    COAGS: Recent Labs    02/25/24 0837  INR 1.1    BMP: Recent Labs    02/22/24 0300 02/24/24 0350 02/25/24 0355 02/26/24 0355  NA 139 135 134* 134*  K 3.6 3.5 3.1* 4.0  CL 102 101 99 99  CO2 23 22 23  21*  GLUCOSE 132* 134* 144* 126*  BUN 19 9 9 8   CALCIUM 8.8* 9.3 9.3 9.3  CREATININE 0.72 0.71 0.70 0.78  GFRNONAA >60 >60 >60 >60    LIVER FUNCTION TESTS: Recent Labs    02/21/24 0235  02/22/24 0300 02/24/24 0350 02/25/24 0355  BILITOT 1.6* 1.2 1.2 1.0  AST 33 33 35 20  ALT 135* 95* 77* 56*  ALKPHOS 384* 185* 189* 167*  PROT 6.9 6.4* 6.8 6.9  ALBUMIN  3.4* 3.2* 3.6 3.6    Assessment and Plan: Pt POD 10 lap cholecystectomy , POD 6 ERCP for leak; s/p pelvic/left TG drain placement 12/31 for post op pelvic fluid collection; afebrile; WBC 11.5(14.3), hgb stable, creat nl; drain fl cx neg to date; as OP rec once daily irrigation of drain with 5 cc sterile saline, output recording and gauze dressing change every 2 to 3 days.  Patient will be scheduled for follow-up in IR drain clinic in 10 to 14 days.  Patient given prescription for saline flushes.   Electronically Signed: D. Franky Rakers, PA-C 02/27/2024, 10:46 AM   I spent a total of 15 Minutes at the the patient's bedside AND on the patient's hospital floor or unit, greater than 50% of which was counseling/coordinating care for pelvic fluid collection drain    Patient ID: Nathan Stewart, male   DOB: 09/26/1955, 69 y.o.   MRN: [id number]  "

## 2024-03-01 LAB — AEROBIC/ANAEROBIC CULTURE W GRAM STAIN (SURGICAL/DEEP WOUND): Culture: NO GROWTH

## 2024-03-07 ENCOUNTER — Other Ambulatory Visit: Payer: Self-pay | Admitting: Family Medicine

## 2024-03-08 ENCOUNTER — Telehealth: Payer: Self-pay | Admitting: Infectious Disease

## 2024-03-08 ENCOUNTER — Ambulatory Visit
Admission: RE | Admit: 2024-03-08 | Discharge: 2024-03-08 | Disposition: A | Discharge status: Home / Self Care | Source: Ambulatory Visit | Attending: General Surgery | Admitting: General Surgery

## 2024-03-08 ENCOUNTER — Ambulatory Visit
Admission: RE | Admit: 2024-03-08 | Discharge: 2024-03-08 | Disposition: A | Discharge status: Home / Self Care | Source: Ambulatory Visit | Attending: Radiology | Admitting: Radiology

## 2024-03-08 DIAGNOSIS — R188 Other ascites: Secondary | ICD-10-CM

## 2024-03-08 HISTORY — PX: IR RADIOLOGIST EVAL & MGMT: IMG5224

## 2024-03-08 MED ORDER — IOPAMIDOL (ISOVUE-300) INJECTION 61%
100.0000 mL | Freq: Once | INTRAVENOUS | Status: AC | PRN
  Administered 2024-03-08: 100 mL via INTRAVENOUS

## 2024-03-08 NOTE — Telephone Encounter (Signed)
 I called [name] to reschedule his 1/13 appt due to Dr. Fleeta Rothman being out of the office. Pt stated this appt would determine if he needed to continue his antibiotic and he is out. Pt asked if Dr. Fleeta Rothman could advise. Brave is scheduled 3/2 and declined seeing another provider. Pt can be reached at  [phone].

## 2024-03-08 NOTE — Progress Notes (Signed)
 "      Chief Complaint: Patient was seen in consultation today for pelvic fluid collection drain placed by IR 02/25/24.  Referring Physician(s): Dr. Tanda ([facility])  History of Present Illness: Nathan Stewart is a 69 y.o. male with past medical history significant for HTN, MSSA infection (left shoulder), and sleep apnea (not on CPAP). He presented to the emergency department on 02/16/24 with abdominal pain, intermittent fevers at home, nausea, and decreased appetite. He was found to have acute cholecystitis, and [facility] was consulted with lap cholecystectomy performed 02/17/24.   He developed postoperative bile leak confirmed by ERCP; CBD stent also placed at that time. Pelvic fluid collection noted on follow up imaging resulting in IR request for drain placement, completed 02/25/24 by Dr. Philip (10Fr, bloody output at time of placement).  He was discharged from [facility] on 02/27/24 and presents to clinic today for first drain f/u. He has not yet seen his surgeon outpatient, scheduled for next week.   He has continued to flush surgical drain RUQ and IR placed drain L TG daily; both have been to bulb suction. Both drains have had 2.5-90mL output each consistently for >3 days. Output has been serosanguinous from L TG, serous from RUQ drain.  He finished the abx prescribed for this concern today (on abx for chronic shoulder infection from another provider). Denies fever, chills, N/V, abd pain apart from occasional gas pain, pain with flushing drain.       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   GEN OSTEOARTHROSIS INVOLVING MULTIPLE SITES 11/10/2007   GERD 01/01/2007   Hx of adenomatous polyp of colon 04/04/2016   Hypertension    INSOMNIA, CHRONIC 11/13/2009   LIVER FUNCTION TESTS, ABNORMAL 08/27/2007   LOC OSTEOARTHROS NOT SPEC PRIM/SEC LOWER LEG  11/10/2007   Memory loss 08/27/2007   MENISCUS TEAR 10/30/2006   MSSA (methicillin susceptible Staphylococcus aureus) infection 04/29/2023   OSTEOARTHRITIS 06/07/2008   Pneumonia    Septic arthritis of shoulder, left (HCC) 09/16/2023   SLEEP APNEA 09/20/2009   no cpap   TESTICULAR HYPOFUNCTION 08/14/2009    Past Surgical History:  Procedure Laterality Date   BILIARY STENT PLACEMENT N/A 02/21/2024   Procedure: INSERTION, STENT, BILE DUCT;  Surgeon: Avram Lupita BRAVO, MD;  Location: [facility];  Service: Gastroenterology;  Laterality: N/A;   CHOLECYSTECTOMY N/A 02/17/2024   Procedure: LAPAROSCOPIC CHOLECYSTECTOMY;  Surgeon: Tanda Locus, MD;  Location: [facility];  Service: General;  Laterality: N/A;  ICG, possible IOC   COLONOSCOPY     COLONOSCOPY WITH PROPOFOL  N/A 05/09/2023   Procedure: COLONOSCOPY WITH PROPOFOL ;  Surgeon: Eartha Angelia Sieving, MD;  [facility]: [facility];  Service: Gastroenterology;  Laterality: N/A;  10:45AM;ASA 1   ERCP N/A 02/21/2024   Procedure: ERCP, WITH INTERVENTION IF INDICATED;  Surgeon: Avram Lupita BRAVO, MD;  Location: [facility];  Service: Gastroenterology;  Laterality: N/A;   HAND SURGERY  over 20 years ago   'metal plate in wrist'   IRRIGATION AND DEBRIDEMENT SHOULDER Left 04/03/2023   Procedure: IRRIGATION AND DEBRIDEMENT SHOULDER;  Surgeon: Dozier Soulier, MD;  Location: [facility];  Service: Orthopedics;  Laterality: Left;   JOINT REPLACEMENT N/A    Phreesia 10/19/2019   MULTIPLE TOOTH EXTRACTIONS     POLYPECTOMY  05/09/2023   Procedure: POLYPECTOMY;  Surgeon: Eartha Angelia Sieving, MD;  Location: [facility];  Service: Gastroenterology;;   SHOULDER SURGERY Right 2021   ligament damage   STONE EXTRACTION WITH BASKET  02/21/2024   Procedure: ERCP, WITH LITHROTRIPSY OR REMOVAL OF COMMON BILE DUCT CALCULUS USING BASKET;  Surgeon: Avram Lupita BRAVO, MD;  Location: [facility];  Service: Gastroenterology;;   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    Procedure: TOTAL KNEE ARTHROPLASTY;  Surgeon: Dempsey Sensor, MD;  Location: [facility];  Service: Orthopedics;  Laterality: Left;   TOTAL KNEE ARTHROPLASTY Right 05/21/2021   Procedure: RIGHT TOTAL KNEE ARTHROPLASTY;  Surgeon: Sensor Dempsey, MD;  Location: [facility];  Service: Orthopedics;  Laterality: Right;   TOTAL SHOULDER REVISION Left 03/11/2023   Procedure: LEFT SHOULDER REVISION GLENOID COMPONENT;  Surgeon: Dozier Soulier, MD;  Location: [facility];  Service: Orthopedics;  Laterality: Left;   TOTAL SHOULDER REVISION Left 04/03/2023   Procedure: SHOULDER REVISION;  Surgeon: Dozier Soulier, MD;  Location: [facility];  Service: Orthopedics;  Laterality: Left;    Allergies: Hydrocodone -acetaminophen , Irbesartan , Percocet [oxycodone -acetaminophen ], and Shingrix [zoster vac recomb adjuvanted]  Medications: Prior to Admission medications  Medication Sig Start Date End Date Taking? Authorizing Provider  ALPRAZolam  (XANAX ) 0.5 MG tablet Take 1 tablet (0.5 mg total) by mouth 3 (three) times daily as needed for anxiety. 11/11/23   [name], MD  amLODipine  (NORVASC ) 5 MG tablet TAKE 1 TABLET(5 MG) BY MOUTH DAILY 12/15/23   [name], MD  amoxicillin -clavulanate (AUGMENTIN ) 875-125 MG tablet Take 1 tablet by mouth 2 (two) times daily for 10 days. 02/27/24 03/08/24  Rizwan, Saima, MD  cefadroxil  (DURICEF) 500 MG capsule Take 2 capsules (1,000 mg total) by mouth 2 (two) times daily. 04/30/23   Fleeta Kathie Jomarie LOISE, MD  ELDERBERRY PO Take 1 tablet by mouth daily.    [provider]  Garlic 1000 MG CAPS Take 1,000 mg by mouth daily.    [provider]  hydrochlorothiazide  (HYDRODIURIL ) 25 MG tablet TAKE 1 TABLET(25 MG) BY MOUTH DAILY Patient taking differently: Take 25 mg by mouth daily. 09/18/23   [name], MD  Magnesium 250 MG TABS Take 250 mg by mouth daily.    [provider]  metoprolol  succinate (TOPROL -XL) 100 MG 24 hr tablet TAKE 1 TABLET BY MOUTH EVERY DAY WITH  OR IMMEDIATELY FOLLOWING A MEAL 12/16/23   [name], MD  Multiple Vitamin (MULTIVITAMIN) tablet Take 1 tablet by mouth daily. Adult 50+    [provider]  Omega-3 Fatty Acids (FISH OIL) 1000 MG CPDR Take 2,000 mg by mouth daily.    [provider]  vitamin C (ASCORBIC ACID) 250 MG tablet Take 250 mg by mouth daily.    [provider]     Family History  Problem Relation Age of Onset   Arthritis Mother    Heart disease Mother    Hypertension Mother    Hypertension Father    Stroke Father    Hypertension Sister    Stroke Brother    Colon cancer Paternal Uncle     Social History   Socioeconomic History   Marital status: Married    Spouse name: Not on file   Number of children: Not on file   Years of education: Not on file   Highest education level: Not on file  Occupational History   Not on file  Tobacco Use   Smoking status: Former    Current packs/day: 0.00    Types: Cigarettes    Quit date: 02/25/1997    Years since quitting:  27.0    Passive exposure: Current   Smokeless tobacco: Never   Tobacco comments:    quit 1999  Vaping Use   Vaping status: Never Used  Substance and Sexual Activity   Alcohol use: Yes    Alcohol/week: 6.0 standard drinks of alcohol    Types: 6 Cans of beer per week    Comment: occasional   Drug use: No   Sexual activity: Yes  Other Topics Concern   Not on file  Social History Narrative   Not on file   Social Drivers of Health   Tobacco Use: Medium Risk (02/21/2024)   Patient History    Smoking Tobacco Use: Former    Smokeless Tobacco Use: Never    Passive Exposure: Current  Physicist, Medical Strain: Low Risk (12/05/2023)   Received from [facility]   Overall Financial Resource Strain (CARDIA)    How hard is it for you to pay for the very basics like food, housing, medical care, and heating?: Not hard at all  Food Insecurity: No Food Insecurity (02/16/2024)   Epic    Worried About Radiation Protection Practitioner  of Food in the Last Year: Never true    Ran Out of Food in the Last Year: Never true  Transportation Needs: No Transportation Needs (02/16/2024)   Epic    Lack of Transportation (Medical): No    Lack of Transportation (Non-Medical): No  Physical Activity: Sufficiently Active (12/05/2023)   [facility] from [facility]   Exercise Vital Sign    On average, how many days per week do you engage in moderate to strenuous exercise (like a brisk walk)?: 3 days    On average, how many minutes do you engage in exercise at this level?: 60 min  Recent Concern: Physical Activity - Inactive (12/04/2023)   Exercise Vital Sign    Days of Exercise per Week: 0 days    Minutes of Exercise per Session: 0 min  Stress: No Stress Concern Present (12/05/2023)   Received from [facility] Occup[facility] - Occupational Stress Questionnaire    Do you feel stress - tense, restless, nervous, or anxious, or unable to sleep at night because your mind is troubled all the time - these days?: Not at all  Social Connections: Socially Integrated (02/16/2024)   Social Connection and Isolation Panel    Frequency of Communication with Friends and Family: More than three times a week    Frequency of Social Gatherings with Friends and Family: Three times a week    Attends Religious Services: More than 4 times per year    Active Member of Clubs or Organizations: Yes    Attends Banker Meetings: More than 4 times per year    Marital Status: Married  Depression (PHQ2-9): Low Risk (01/27/2024)   Depression (PHQ2-9)    PHQ-2 Score: 0  Alcohol Screen: Low Risk (12/04/2023)   Alcohol Screen    Last Alcohol Screening Score (AUDIT): 0  Housing: Low Risk (02/16/2024)   Epic    Unable to Pay for Housing in the Last Year: No    Number of Times Moved in the Last Year: 0    Homeless in the Last Year: No  Utilities: Not At Risk (02/16/2024)   Epic    Threatened with loss of utilities: No   Health Literacy: Adequate Health Literacy (12/04/2023)   B1300 Health Literacy    Frequency of need for help with medical instructions: Never     Review  of Systems: A 12 point ROS discussed and pertinent positives are indicated in the HPI above.  All other systems are negative.  Vital Signs: There were no vitals taken for this visit.   Physical Exam Constitutional:      Comments: ambulatory  Pulmonary:     Effort: Pulmonary effort is normal.  Abdominal:     General: There is no distension.     Palpations: Abdomen is soft.     Tenderness: There is no abdominal tenderness.     Comments: L TG drain present, site unremarkable, dressing C/D/I, scant serosanguinous output in appropriately charged bulb, ext suture intact. RUQ surgical drain present, bulb appropriately charged with scant serous material.   Neurological:     Mental Status: He is alert and oriented to person, place, and time.  Psychiatric:        Mood and Affect: Mood normal.        Behavior: Behavior normal.        Thought Content: Thought content normal.        Judgment: Judgment normal.         Imaging: CT GUIDED PERITONEAL/RETROPERITONEAL FLUID DRAIN BY PERC CATH Result Date: 02/25/2024 INDICATION: 69 year old with a postoperative pelvic fluid collection. EXAM: CT-GUIDED PELVIC DRAIN PLACEMENT TECHNIQUE: Multidetector CT imaging of the pelvis was performed following the standard protocol without IV contrast. RADIATION DOSE REDUCTION: This exam was performed according to the departmental dose-optimization program which includes automated exposure control, adjustment of the mA and/or kV according to patient size and/or use of iterative reconstruction technique. MEDICATIONS: Moderate sedation ANESTHESIA/SEDATION: Moderate (conscious) sedation was employed during this procedure. A total of Versed  2 mg and Fentanyl  150 mcg was administered intravenously by the radiology nurse. Total intra-service moderate Sedation Time:  25 minutes. The patient's level of consciousness and vital signs were monitored continuously by radiology nursing throughout the procedure under my direct supervision. COMPLICATIONS: None immediate. PROCEDURE: Informed written consent was obtained from the patient after a thorough discussion of the procedural risks, benefits and alternatives. All questions were addressed. Maximal Sterile Barrier Technique was utilized including caps, mask, sterile gowns, sterile gloves, sterile drape, hand hygiene and skin antiseptic. A timeout was performed prior to the initiation of the procedure. Patient was placed prone. CT images of the pelvis were obtained. Left buttock was prepped with chlorhexidine  and sterile field was created. Skin was anesthetized using 1% lidocaine . Small incision was made. Using CT guidance, an 18 gauge trocar needle was directed into the pelvic fluid collection using a transgluteal approach. Bloody fluid was aspirated. Superstiff Amplatz wire was advanced into the collection. The tract was dilated to accommodate a 10 French multipurpose drain. 15 mL of bloody fluid was aspirated from the collection. Drain was attached to a suction bulb and sutured to skin. FINDINGS: Drain was placed into the pelvic fluid collection using a left transgluteal approach. 15 mL of bloody fluid was aspirated. IMPRESSION: CT-guided placement of a drainage catheter in the pelvic fluid collection. Electronically Signed   By: Juliene Balder M.D.   On: 02/25/2024 16:54   DG ERCP Result Date: 02/25/2024 CLINICAL DATA:  History of bile leak EXAM: ERCP 13 fluoroscopic images of the right upper quadrant and 4 cine loops TECHNIQUE: Multiple spot images obtained with the fluoroscopic device and submitted for interpretation post-procedure. FLUOROSCOPY: Radiation Exposure Index (as provided by the fluoroscopic device): 64.8 mGy Kerma COMPARISON:  None Available. FINDINGS: Images demonstrate flexible endoscopy device with guidewire  advanced into the common bile duct. Surgical  clips in the right upper quadrant consistent with previous cholecystectomy. Mild-to-moderate dilatation of the common bile duct after contrast injection. Contrast can be seen flowing into the cystic duct and then extravasated into the gallbladder fossa. Interval placement of plastic common bile duct stent. IMPRESSION: Bile duct leak status post cholecystectomy. ERCP with interval placement of plastic common bile duct stent. These images were submitted for radiologic interpretation only. Please see the procedural report for the amount of contrast and the fluoroscopy time utilized. Electronically Signed   By: Cordella Banner   On: 02/25/2024 10:06   CT ABDOMEN PELVIS W CONTRAST Result Date: 02/25/2024 EXAM: CT ABDOMEN AND PELVIS WITH CONTRAST 02/24/2024 02:45:34 PM TECHNIQUE: CT of the abdomen and pelvis was performed with the administration of 100 mL of iohexol  (OMNIPAQUE ) 300 MG/ML solution. Multiplanar reformatted images are provided for review. Automated exposure control, iterative reconstruction, and/or weight-based adjustment of the mA/kV was utilized to reduce the radiation dose to as low as reasonably achievable. COMPARISON: CT abdomen and pelvis from 02/20/2024 and CT angio chest 02/16/2024. CLINICAL HISTORY: Abdominal pain, post-op. FINDINGS: LOWER CHEST: Aortic atherosclerosis and coronary artery calcifications. Trace anterior inferior pericardial fluid. Small bilateral pleural effusions are new from the previous exam. Atelectasis is identified within the posterior basal and lateral basal right lower lobe and posterior left base. Emphysema with diffuse bronchial wall thickening. The central airways are patent. Remote healed right posterior rib fractures. Thoracic degenerative disc disease. LIVER: There is a surgical drainage catheter which entered from a right lateral abdominal wall approach and courses through the gallbladder fossa and terminates along the  falciform ligament. Previous loculated fluid collection containing gas has decreased in volume from the prior exam. Currently, this measures 4.2 x 2.6 x 2.1 cm, axial 65/3. On the previous examination, using the same measurement scheme as today, this fluid collection measured 9.3 x 3.0 x 4.9 cm. Similar appearance of inflammatory changes including soft tissue stranding and free fluid within the right upper quadrant of the abdomen. GALLBLADDER AND BILE DUCTS: Status post cholecystectomy. Mild pneumobilia. Interval placement of a common bile duct stent. No significant bile duct dilatation. SPLEEN: No acute abnormality. PANCREAS: No acute abnormality. ADRENAL GLANDS: No acute abnormality. KIDNEYS, URETERS AND BLADDER: 2.3 cm Bosniak class 1 cyst arises off the lateral left kidney. Per consensus, no follow-up is needed for simple Bosniak type 1 and 2 renal cysts, unless the patient has a malignancy history or risk factors. No stones in the kidneys or ureters. No hydronephrosis. No perinephric or periureteral stranding. Urinary bladder is unremarkable. GI AND BOWEL: Stomach demonstrates no acute abnormality. Persistent increased caliber of small bowel loops with multiple air-fluid levels, which measure up to 3.2 cm. Distal small bowel loops are decreased in caliber, and there is a normal caliber colon. Sigmoid diverticulosis is identified. Interval development of mild wall thickening with surrounding hyperemia of the vasorectal displacement of the sigmoid mesenteric fat suggestive of diverticulitis versus colitis. This is seen on axial image 104/3. There is no bowel obstruction. PERITONEUM AND RETROPERITONEUM: A maturing fluid collection within the pelvis with signs of early neural enhancement/loculation measuring 6.4 x 5.6 x 3.5 cm. No ascites. No free air. VASCULATURE: Aortic atherosclerotic calcification. Aorta is normal in caliber. LYMPH NODES: No abdominal or pelvic adenopathy. REPRODUCTIVE ORGANS: No acute  abnormality. BONES AND SOFT TISSUES: Lumbar spondylosis. No acute osseous abnormality. No focal soft tissue abnormality. IMPRESSION: 1. Decreased volume of the previous loculated fluid collection containing gas in the right upper quadrant, now measuring  4.2 x 2.6 x 2.1 cm. 2. Maturing fluid collection within the pelvis with early rim enhancement/loculation, measuring 6.4 x 5.6 x 3.5 cm. 3. Persistent increased caliber of small bowel loops with multiple air-fluid levels, measuring up to 3.2 cm. Differential considerations include postoperative ileus versus distal bowel obstruction. 4. Interval development of mild sigmoid wall thickening with surrounding hyperemia and mesenteric fat stranding, suggestive of diverticulitis versus colitis. 5. Status post cholecystectomy with interval placement of a common bile duct stent, without significant bile duct dilatation and with mild pneumobilia. Electronically signed by: Waddell Calk MD 02/25/2024 06:58 AM EST RP Workstation: [id number]   CT CHEST W CONTRAST Result Date: 02/25/2024 EXAM: CT ABDOMEN AND PELVIS WITH CONTRAST 02/24/2024 02:45:34 PM TECHNIQUE: CT of the abdomen and pelvis was performed with the administration of 100 mL of iohexol  (OMNIPAQUE ) 300 MG/ML solution. Multiplanar reformatted images are provided for review. Automated exposure control, iterative reconstruction, and/or weight-based adjustment of the mA/kV was utilized to reduce the radiation dose to as low as reasonably achievable. COMPARISON: CT abdomen and pelvis from 02/20/2024 and CT angio chest 02/16/2024. CLINICAL HISTORY: Abdominal pain, post-op. FINDINGS: LOWER CHEST: Aortic atherosclerosis and coronary artery calcifications. Trace anterior inferior pericardial fluid. Small bilateral pleural effusions are new from the previous exam. Atelectasis is identified within the posterior basal and lateral basal right lower lobe and posterior left base. Emphysema with diffuse bronchial wall thickening.  The central airways are patent. Remote healed right posterior rib fractures. Thoracic degenerative disc disease. LIVER: There is a surgical drainage catheter which entered from a right lateral abdominal wall approach and courses through the gallbladder fossa and terminates along the falciform ligament. Previous loculated fluid collection containing gas has decreased in volume from the prior exam. Currently, this measures 4.2 x 2.6 x 2.1 cm, axial 65/3. On the previous examination, using the same measurement scheme as today, this fluid collection measured 9.3 x 3.0 x 4.9 cm. Similar appearance of inflammatory changes including soft tissue stranding and free fluid within the right upper quadrant of the abdomen. GALLBLADDER AND BILE DUCTS: Status post cholecystectomy. Mild pneumobilia. Interval placement of a common bile duct stent. No significant bile duct dilatation. SPLEEN: No acute abnormality. PANCREAS: No acute abnormality. ADRENAL GLANDS: No acute abnormality. KIDNEYS, URETERS AND BLADDER: 2.3 cm Bosniak class 1 cyst arises off the lateral left kidney. Per consensus, no follow-up is needed for simple Bosniak type 1 and 2 renal cysts, unless the patient has a malignancy history or risk factors. No stones in the kidneys or ureters. No hydronephrosis. No perinephric or periureteral stranding. Urinary bladder is unremarkable. GI AND BOWEL: Stomach demonstrates no acute abnormality. Persistent increased caliber of small bowel loops with multiple air-fluid levels, which measure up to 3.2 cm. Distal small bowel loops are decreased in caliber, and there is a normal caliber colon. Sigmoid diverticulosis is identified. Interval development of mild wall thickening with surrounding hyperemia of the vasorectal displacement of the sigmoid mesenteric fat suggestive of diverticulitis versus colitis. This is seen on axial image 104/3. There is no bowel obstruction. PERITONEUM AND RETROPERITONEUM: A maturing fluid collection  within the pelvis with signs of early neural enhancement/loculation measuring 6.4 x 5.6 x 3.5 cm. No ascites. No free air. VASCULATURE: Aortic atherosclerotic calcification. Aorta is normal in caliber. LYMPH NODES: No abdominal or pelvic adenopathy. REPRODUCTIVE ORGANS: No acute abnormality. BONES AND SOFT TISSUES: Lumbar spondylosis. No acute osseous abnormality. No focal soft tissue abnormality. IMPRESSION: 1. Decreased volume of the previous loculated fluid collection containing gas  in the right upper quadrant, now measuring 4.2 x 2.6 x 2.1 cm. 2. Maturing fluid collection within the pelvis with early rim enhancement/loculation, measuring 6.4 x 5.6 x 3.5 cm. 3. Persistent increased caliber of small bowel loops with multiple air-fluid levels, measuring up to 3.2 cm. Differential considerations include postoperative ileus versus distal bowel obstruction. 4. Interval development of mild sigmoid wall thickening with surrounding hyperemia and mesenteric fat stranding, suggestive of diverticulitis versus colitis. 5. Status post cholecystectomy with interval placement of a common bile duct stent, without significant bile duct dilatation and with mild pneumobilia. Electronically signed by: Waddell Calk MD 02/25/2024 06:58 AM EST RP Workstation: [id number]   DG CHEST PORT 1 VIEW Result Date: 02/22/2024 CLINICAL DATA:  Shortness of breath. EXAM: PORTABLE CHEST 1 VIEW COMPARISON:  02/18/2024 FINDINGS: Normal heart size with stable mediastinal contours. Slight increase in atelectasis at the lung bases. No pulmonary edema, large pleural effusion or pneumothorax. Reverse left shoulder arthroplasty. IMPRESSION: Slight increase in atelectasis at the lung bases. Electronically Signed   By: Andrea Gasman M.D.   On: 02/22/2024 10:51   CT ABDOMEN PELVIS WO CONTRAST Result Date: 02/20/2024 EXAM: CT ABDOMEN AND PELVIS WITHOUT CONTRAST 02/20/2024 01:22:00 PM TECHNIQUE: CT of the abdomen and pelvis was performed without the  administration of intravenous contrast. Multiplanar reformatted images are provided for review. Automated exposure control, iterative reconstruction, and/or weight-based adjustment of the mA/kV was utilized to reduce the radiation dose to as low as reasonably achievable. COMPARISON: HIDA scan dated 02/20/2024. CLINICAL HISTORY: Abdominal pain, post-op.Postoperative cholecystectomy day 3. FINDINGS: LOWER CHEST: Mild bibasilar atelectasis. LIVER: The liver is unremarkable. GALLBLADDER AND BILE DUCTS: Collection in the gallbladder fossa measures 6.1 x 4.8 cm. The collection is low density. Percutaneous drain extends through the fluid collection. Cholecystectomy clips in the region of the cystic duct. There is no biliary ductal dilatation. The common bile duct is normal caliber. SPLEEN: No acute abnormality. PANCREAS: No evidence of pancreatitis on noncontrast exam. ADRENAL GLANDS: No acute abnormality. KIDNEYS, URETERS AND BLADDER: No stones in the kidneys or ureters. No hydronephrosis. No perinephric or periureteral stranding. Urinary bladder is unremarkable. GI AND BOWEL: Stomach is normal. Duodenum appears normal. The bowel is fluid filled with air-fluid levels suggesting postoperative ileus. No pneumatosis. Stool in the rectum. PERITONEUM AND RETROPERITONEUM: Small amount of scattered gas in the upper peritoneal space related to recent laparoscopic surgery. Small amount of high density fluid collects dependently within the posterior cul de sac with a fluid-fluid level. Small volume hemoperitoneum presumably related to recent surgery. VASCULATURE: Aorta is normal in caliber. LYMPH NODES: No lymphadenopathy. REPRODUCTIVE ORGANS: No acute abnormality. BONES AND SOFT TISSUES: No acute osseous abnormality. No focal soft tissue abnormality. IMPRESSION: 1. Collection in the gallbladder fossa measuring 6.1 x 4.8 cm with percutaneous drain in place. Findings consistent with predominantly contained bowel gas. 2.  Postoperative ileus. 3. Small volume hemoperitoneum complex in the posterior cul de sac , presumably related to recent surgery. 4. These results will be called to the ordering clinician or representative by the radiologist assistant, and communication documented in the pacs or clario dashboard Electronically signed by: Norleen Boxer MD 02/20/2024 03:59 PM EST RP Workstation: [id number]   DG Abd Portable 1V Result Date: 02/20/2024 CLINICAL DATA:  Abdominal distension. EXAM: PORTABLE ABDOMEN - 1 VIEW COMPARISON:  02/18/2024 FINDINGS: Increasing gaseous distention of bowel loops in the central abdomen, primarily colonic distension, however prominent air-filled loops of small bowel are also seen. Drain in the right mid abdomen. Right  upper quadrant surgical clips. IMPRESSION: Increasing gaseous distention of bowel loops in the central abdomen, primarily colonic distension, however prominent air-filled loops of small bowel are also seen. Favor generalized ileus. Electronically Signed   By: Andrea Gasman M.D.   On: 02/20/2024 14:05   NM HEPATOBILIARY LEAK (POST-SURGICAL) Result Date: 02/20/2024 EXAM: NM HEPATOBILLARY SCAN 02/20/2024 12:50:37 PM TECHNIQUE: RADIOPHARMACEUTICAL: 5.4 mCi Tc-31m mebrofenin  Dynamic images of the abdomen and pelvis were obtained in the anterior projection for 1 hour after intravenous administration of radiopharmaceutical. The biliary drain was clamped for the second hour of the exam and imaging continued for at least 30 minutes. COMPARISON: None available. CLINICAL HISTORY: Abdominal pain, post surgery or trauma, bile leak suspected. FINDINGS: Homogenous uptake within the liver. Normal clearance of the blood pool. Initial imaging demonstrates excreted radiotracer in the common hepatic duct with evidence of radiotracer within the biliary drain. The drain was clamped for the second hour of the exam. With the drain clamped, extra biliary radiotracer collects within the gallbladder fossa.  Findings consistent with contained bowel leak within the gallbladder fossa. Radiotracer evacuating through the biliary drain when unclamped. No activity in the small bowel. IMPRESSION: 1. Radiotracer evacuates through the biliary drain when unclamped. 2. With drain clamped, extra biliary radiotracer collects within the gallbladder fossa. 3. Findings consistent with biliary leak . No activity in the small bowel suggests brisk leak. 4. These results will be called to the ordering clinician or representative by the radiologist assistant, and communication documented in the pacs or clario dashboard Electronically signed by: Norleen Boxer MD 02/20/2024 01:11 PM EST RP Workstation: [id number]   MR ABDOMEN MRCP WO CONTRAST Result Date: 02/19/2024 CLINICAL DATA:  Postop day 1 from laparoscopic cholecystectomy. Elevated LFTs. Clinical concern for retained common bile duct stone. EXAM: MRI ABDOMEN WITHOUT CONTRAST  (INCLUDING MRCP) TECHNIQUE: Multiplanar multisequence MR imaging of the abdomen was performed. Heavily T2-weighted images of the biliary and pancreatic ducts were obtained, and three-dimensional MRCP images were rendered by post processing. COMPARISON:  Abdomen and pelvis CT 02/16/2024 FINDINGS: Lower chest: Dependent atelectasis with tiny right pleural effusion. Hepatobiliary: No suspicious focal abnormality in the liver on this noncontrast study. There is some periportal edema with edema in the inferior liver around the gallbladder fossa. Heterogeneous signal intensity in the gallbladder fossa likely reflects a combination of postoperative fluid/blood products in packing material. No substantial intrahepatic biliary duct dilatation. Common duct measures 7 mm diameter in the porta hepatis. Common bile duct just proximal to the ampulla is 7 mm diameter. There is no evidence for choledocholithiasis. Pancreas: No focal mass lesion. No dilatation of the main duct. No intraparenchymal cyst. No peripancreatic edema.  Edema around the duodenum and pancreatic head is 2 compatible with recent surgery. Spleen:  No splenomegaly. No suspicious focal mass lesion. Adrenals/Urinary Tract: No adrenal nodule or mass. Bilateral renal cysts evident. Stomach/Bowel: Stomach is moderately distended with fluid. Small bowel and colon shows mild diffuse gaseous distension. Vascular/Lymphatic: No abdominal aortic aneurysm small retroperitoneal lymph nodes are similar to recent CT scan. Other: Edema in the anterior abdominal wall is compatible with the recent surgical access. There is dependent subcutaneous body wall edema bilaterally bilaterally. Musculoskeletal: No overtly suspicious marrow signal abnormality. IMPRESSION: 1. Postop day 1 from cholecystectomy. Heterogeneous signal intensity in the gallbladder fossa likely reflects a combination of postoperative fluid/blood products and packing material. 2. No substantial intrahepatic biliary duct dilatation. Common duct measures 7 mm diameter in the porta hepatis and just proximal to the ampulla. No  evidence for choledocholithiasis. 3. Mild diffuse gaseous distension of the small bowel and colon compatible with mild postoperative ileus. 4. Dependent atelectasis with tiny right pleural effusion. Electronically Signed   By: Camellia Candle M.D.   On: 02/19/2024 05:32   DG Abd Portable 1V Result Date: 02/18/2024 CLINICAL DATA:  Distension postop cholecystostomy EXAM: PORTABLE ABDOMEN - 1 VIEW COMPARISON:  CT 02/16/2024 FINDINGS: Postop changes in the right upper quadrant with clips and probable drainage catheter. Mild increased small and large bowel gas without obstruction. Moderate stool IMPRESSION: Mild increased small and large bowel gas without obstruction, possible mild ileus. Moderate stool. Electronically Signed   By: Luke Bun M.D.   On: 02/18/2024 15:59   DG Chest Port 1 View Result Date: 02/18/2024 CLINICAL DATA:  Shortness of breath EXAM: PORTABLE CHEST 1 VIEW COMPARISON:   02/16/2024 FINDINGS: Hypoventilatory change. Minimal atelectasis right base. No consolidation, pleural effusion or pneumothorax. Normal cardiac size. Aortic atherosclerosis. Left shoulder replacement IMPRESSION: Hypoventilatory changes with minimal atelectasis at the right base. Electronically Signed   By: Luke Bun M.D.   On: 02/18/2024 15:58   CT ABDOMEN PELVIS W CONTRAST Result Date: 02/16/2024 EXAM: CT ABDOMEN AND PELVIS WITH CONTRAST 02/16/2024 07:57:25 PM TECHNIQUE: CT of the abdomen and pelvis was performed with the administration of 100 mL of iohexol  (OMNIPAQUE ) 350 MG/ML injection. Multiplanar reformatted images are provided for review. Automated exposure control, iterative reconstruction, and/or weight-based adjustment of the mA/kV was utilized to reduce the radiation dose to as low as reasonably achievable. COMPARISON: None available. CLINICAL HISTORY: Epigastric pain; cp, epigastric abd pain, leukocytosis. FINDINGS: LOWER CHEST: No acute abnormality. LIVER: Hepatic steatosis. GALLBLADDER AND BILE DUCTS: Distended gallbladder with wall thickening and pericholecystic fluid compatible with acute cholecystitis. No radiopaque stone or biliary ductal dilatation. SPLEEN: No acute abnormality. PANCREAS: No acute abnormality. ADRENAL GLANDS: No acute abnormality. KIDNEYS, URETERS AND BLADDER: No stones in the kidneys or ureters. No hydronephrosis. No perinephric or periureteral stranding. Urinary bladder is unremarkable. GI AND BOWEL: Stomach demonstrates no acute abnormality. Colonic diverticulosis without evidence of diverticulitis. There is no bowel obstruction. PERITONEUM AND RETROPERITONEUM: No ascites. No free air. VASCULATURE: Aorta is normal in caliber. Aortic atherosclerotic calcification. LYMPH NODES: No lymphadenopathy. REPRODUCTIVE ORGANS: No acute abnormality. BONES AND SOFT TISSUES: No acute osseous abnormality. No focal soft tissue abnormality. IMPRESSION: 1. Acute cholecystitis.  Electronically signed by: Norman Gatlin MD 02/16/2024 08:10 PM EST RP Workstation: [id number]   CT Angio Chest PE W and/or Wo Contrast Result Date: 02/16/2024 EXAM: CTA CHEST 02/16/2024 07:57:25 PM TECHNIQUE: CTA of the chest was performed with the administration of 100 mL of iohexol  (OMNIPAQUE ) 350 MG/ML injection. Multiplanar reformatted images are provided for review. MIP images are provided for review. Automated exposure control, iterative reconstruction, and/or weight based adjustment of the mA/kV was utilized to reduce the radiation dose to as low as reasonably achievable. COMPARISON: Comparison with the same day x-ray. CLINICAL HISTORY: Pulmonary embolism (PE) suspected, high prob; cough, cp, abd pain. FINDINGS: PULMONARY ARTERIES: Pulmonary arteries are adequately opacified for evaluation. No acute pulmonary embolus. Main pulmonary artery is normal in caliber. MEDIASTINUM: Coronary artery and aortic atherosclerotic calcification. The heart and pericardium demonstrate no acute abnormality. There is no acute abnormality of the thoracic aorta. LYMPH NODES: No mediastinal, hilar or axillary lymphadenopathy. LUNGS AND PLEURA: Emphysema. Right lower lobe atelectasis. No evidence of pleural effusion or pneumothorax. UPPER ABDOMEN: Hepatic steatosis. SOFT TISSUES AND BONES: No acute bone or soft tissue abnormality. IMPRESSION: 1. No pulmonary embolism.  2. Emphysema. Pulmonary emphysema is an independent risk factor for lung cancer. Recommend consideration for evaluation for a low-dose CT lung cancer screening program. Electronically signed by: Norman Gatlin MD 02/16/2024 08:07 PM EST RP Workstation: [id number]   DG Chest Portable 1 View Result Date: 02/16/2024 CLINICAL DATA:  Cough. EXAM: PORTABLE CHEST 1 VIEW COMPARISON:  05/11/2021. FINDINGS: The heart size and mediastinal contours are within normal limits. There is elevation of the right diaphragm. Atelectasis is noted at the lung bases bilaterally. No  effusion or pneumothorax is seen. 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 IMPRESSION: Elevation of the right diaphragm with atelectasis at the lung bases. Electronically Signed   By: Leita Birmingham M.D.   On: 02/16/2024 17:48    Labs:  CBC: Recent Labs    02/24/24 0350 02/25/24 0355 02/26/24 0355 02/27/24 0316  WBC 23.5* 20.7* 14.3* 11.5*  HGB 11.2* 10.6* 10.3* 10.8*  HCT 33.9* 32.2* 31.9* 33.9*  PLT 489* 495* 450* 642*    COAGS: Recent Labs    02/25/24 0837  INR 1.1    BMP: Recent Labs    02/22/24 0300 02/24/24 0350 02/25/24 0355 02/26/24 0355  NA 139 135 134* 134*  K 3.6 3.5 3.1* 4.0  CL 102 101 99 99  CO2 23 22 23  21*  GLUCOSE 132* 134* 144* 126*  BUN 19 9 9 8   CALCIUM 8.8* 9.3 9.3 9.3  CREATININE 0.72 0.71 0.70 0.78  GFRNONAA >60 >60 >60 >60    LIVER FUNCTION TESTS: Recent Labs    02/21/24 0235 02/22/24 0300 02/24/24 0350 02/25/24 0355  BILITOT 1.6* 1.2 1.2 1.0  AST 33 33 35 20  ALT 135* 95* 77* 56*  ALKPHOS 384* 185* 189* 167*  PROT 6.9 6.4* 6.8 6.9  ALBUMIN  3.4* 3.2* 3.6 3.6    TUMOR MARKERS: No results for input(s): AFPTM, CEA, CA199, CHROMGRNA in the last 8760 hours.  Assessment and Plan:  Patient with L TG pelvic fluid collection drain s/p lap chole with bile leak.   CT abd/pelv w/ contrast obtained as part of clinic visit. Case and imaging reviewed by Dr. Luverne: evidence of migration of CBD stent; however, pelvic collection appears resolved. This is corroborated by clinical findings of diminished output of <5 cc of serous/serosanguinous OP from both drains for last several days and ROS. Low suspicion for continued leak, collection appears resolved, will remove TG drain today. No indication for drain injection prior to removal.   Discussed potential for fluid recollection, aftercare instructions post-removal, and red flag sx. Patient and patient family agrees with plan to remove TG drain today (insertion region cleansed, ext  suture cut, drain cut releasing internal suture, drain removed along with internal suture in its entirety, well tolerated, dressed with clean gauze).   Surgical drain to be managed by surgical team. Keep your upcoming follow up with Dr. Tanda.   No additional scheduled f/u with IR clinic needed. Please reach out with additional concerns.   Thank you for this interesting consult.  I greatly enjoyed meeting Nathan Stewart and look forward to participating in their care.  A copy of this report was sent to the requesting provider on this date.  Electronically Signed: Masayoshi Couzens 03/08/2024, 8:41 AM   I spent a total of    10 Minutes in face to face in clinical consultation, greater than 50% of which was counseling/coordinating care for s/p pelvic fluid collection drain.   "

## 2024-03-08 NOTE — Telephone Encounter (Signed)
 Patient aware and has refills.

## 2024-03-09 ENCOUNTER — Ambulatory Visit: Admitting: Infectious Disease

## 2024-03-09 ENCOUNTER — Telehealth: Payer: Self-pay | Admitting: Internal Medicine

## 2024-03-09 NOTE — Telephone Encounter (Signed)
 Needs ERCP and stent removal

## 2024-03-11 ENCOUNTER — Other Ambulatory Visit: Payer: Self-pay | Admitting: Internal Medicine

## 2024-03-11 ENCOUNTER — Other Ambulatory Visit: Payer: Self-pay

## 2024-03-11 DIAGNOSIS — K839 Disease of biliary tract, unspecified: Secondary | ICD-10-CM

## 2024-03-11 NOTE — Anesthesia Preprocedure Evaluation (Addendum)
"                                    Anesthesia Evaluation  Patient identified by MRN, date of birth, ID band Patient awake    Reviewed: Allergy & Precautions, NPO status , Patient's Chart, lab work & pertinent test results, reviewed documented beta blocker date and time   History of Anesthesia Complications Negative for: history of anesthetic complications  Airway Mallampati: II  TM Distance: >3 FB Neck ROM: Full    Dental  (+) Teeth Intact, Dental Advisory Given   Pulmonary sleep apnea (noncomplaint) , former smoker   Pulmonary exam normal        Cardiovascular hypertension, Pt. on medications and Pt. on home beta blockers Normal cardiovascular exam     Neuro/Psych  Prev cervical fusion   negative psych ROS   GI/Hepatic Neg liver ROS,GERD  Controlled,,  Endo/Other   Na 131 K 3.3   Renal/GU negative Renal ROS     Musculoskeletal  (+) Arthritis ,    Abdominal   Peds  Hematology  (+) Blood dyscrasia, anemia   Anesthesia Other Findings   Reproductive/Obstetrics                              Anesthesia Physical Anesthesia Plan  ASA: 2  Anesthesia Plan: General   Post-op Pain Management: Minimal or no pain anticipated   Induction: Intravenous  PONV Risk Score and Plan: 2 and Treatment may vary due to age or medical condition, Ondansetron  and Dexamethasone   Airway Management Planned: Oral ETT  Additional Equipment: None  Intra-op Plan:   Post-operative Plan: Extubation in OR  Informed Consent: I have reviewed the patients History and Physical, chart, labs and discussed the procedure including the risks, benefits and alternatives for the proposed anesthesia with the patient or authorized representative who has indicated his/her understanding and acceptance.     Dental advisory given  Plan Discussed with: CRNA and Anesthesiologist  Anesthesia Plan Comments:          Anesthesia Quick Evaluation  "

## 2024-03-11 NOTE — Telephone Encounter (Signed)
 ERCP has been entered for 03/12/24 at [facility] at 730 am to arrive at 6 am - NPO after midnight  Unasyn  and diclofenac  orders entered   The pt has been instructed

## 2024-03-11 NOTE — Telephone Encounter (Signed)
 Please enter orders for ERCP to be done at [facility] tomorrow.  On a separate thread which I will link to you I have been secure chatting and he can have an ERCP tomorrow by Dr. Charlanne at [facility].  His biliary stent is out in the duodenum and needs to be removed and possibly replaced.  I have spoken to the patient twice and he knows that we will do this tomorrow we just do not have the time yet.  I have explained to him to be n.p.o. after midnight except medicines.  Once we get an official time please tell him when to arrive etc. and remind him about n.p.o. status etc.   Please order Unasyn  and diclofenac  on-call

## 2024-03-12 ENCOUNTER — Ambulatory Visit (HOSPITAL_COMMUNITY)
Admission: RE | Admit: 2024-03-12 | Discharge: 2024-03-12 | Disposition: A | Discharge status: Home / Self Care | Attending: Gastroenterology | Admitting: Gastroenterology

## 2024-03-12 ENCOUNTER — Encounter (HOSPITAL_COMMUNITY): Admitting: Anesthesiology

## 2024-03-12 ENCOUNTER — Telehealth: Payer: Self-pay

## 2024-03-12 ENCOUNTER — Ambulatory Visit (HOSPITAL_COMMUNITY)

## 2024-03-12 ENCOUNTER — Other Ambulatory Visit: Payer: Self-pay

## 2024-03-12 ENCOUNTER — Encounter (HOSPITAL_COMMUNITY): Admission: RE | Disposition: A | Payer: Self-pay | Source: Home / Self Care | Attending: Gastroenterology

## 2024-03-12 ENCOUNTER — Ambulatory Visit (HOSPITAL_COMMUNITY): Admitting: Anesthesiology

## 2024-03-12 ENCOUNTER — Encounter (HOSPITAL_COMMUNITY): Payer: Self-pay | Admitting: Gastroenterology

## 2024-03-12 DIAGNOSIS — Z87891 Personal history of nicotine dependence: Secondary | ICD-10-CM | POA: Insufficient documentation

## 2024-03-12 DIAGNOSIS — Z79899 Other long term (current) drug therapy: Secondary | ICD-10-CM | POA: Diagnosis not present

## 2024-03-12 DIAGNOSIS — Z4659 Encounter for fitting and adjustment of other gastrointestinal appliance and device: Secondary | ICD-10-CM | POA: Insufficient documentation

## 2024-03-12 DIAGNOSIS — T859XXA Unspecified complication of internal prosthetic device, implant and graft, initial encounter: Secondary | ICD-10-CM

## 2024-03-12 DIAGNOSIS — I96 Gangrene, not elsewhere classified: Secondary | ICD-10-CM | POA: Insufficient documentation

## 2024-03-12 DIAGNOSIS — K839 Disease of biliary tract, unspecified: Secondary | ICD-10-CM

## 2024-03-12 DIAGNOSIS — T85520A Displacement of bile duct prosthesis, initial encounter: Secondary | ICD-10-CM | POA: Diagnosis not present

## 2024-03-12 DIAGNOSIS — K8030 Calculus of bile duct with cholangitis, unspecified, without obstruction: Secondary | ICD-10-CM | POA: Diagnosis not present

## 2024-03-12 DIAGNOSIS — K567 Ileus, unspecified: Secondary | ICD-10-CM | POA: Diagnosis not present

## 2024-03-12 DIAGNOSIS — Z9049 Acquired absence of other specified parts of digestive tract: Secondary | ICD-10-CM

## 2024-03-12 DIAGNOSIS — G4730 Sleep apnea, unspecified: Secondary | ICD-10-CM | POA: Insufficient documentation

## 2024-03-12 DIAGNOSIS — K219 Gastro-esophageal reflux disease without esophagitis: Secondary | ICD-10-CM | POA: Diagnosis not present

## 2024-03-12 DIAGNOSIS — I10 Essential (primary) hypertension: Secondary | ICD-10-CM | POA: Insufficient documentation

## 2024-03-12 HISTORY — PX: ERCP: SHX5425

## 2024-03-12 HISTORY — PX: STENT REMOVAL: SHX6421

## 2024-03-12 HISTORY — PX: BILIARY STENT PLACEMENT: SHX5538

## 2024-03-12 LAB — CBC WITH DIFFERENTIAL/PLATELET
Abs Immature Granulocytes: 0.03 K/uL (ref 0.00–0.07)
Basophils Absolute: 0.1 K/uL (ref 0.0–0.1)
Basophils Relative: 1 %
Eosinophils Absolute: 0.3 K/uL (ref 0.0–0.5)
Eosinophils Relative: 5 %
HCT: 34.7 % — ABNORMAL LOW (ref 39.0–52.0)
Hemoglobin: 11.5 g/dL — ABNORMAL LOW (ref 13.0–17.0)
Immature Granulocytes: 1 %
Lymphocytes Relative: 19 %
Lymphs Abs: 1.2 K/uL (ref 0.7–4.0)
MCH: 29 pg (ref 26.0–34.0)
MCHC: 33.1 g/dL (ref 30.0–36.0)
MCV: 87.6 fL (ref 80.0–100.0)
Monocytes Absolute: 0.6 K/uL (ref 0.1–1.0)
Monocytes Relative: 9 %
Neutro Abs: 4.4 K/uL (ref 1.7–7.7)
Neutrophils Relative %: 65 %
Platelets: 290 K/uL (ref 150–400)
RBC: 3.96 MIL/uL — ABNORMAL LOW (ref 4.22–5.81)
RDW: 12.6 % (ref 11.5–15.5)
WBC: 6.5 K/uL (ref 4.0–10.5)
nRBC: 0 % (ref 0.0–0.2)

## 2024-03-12 MED ORDER — GLUCAGON HCL RDNA (DIAGNOSTIC) 1 MG IJ SOLR
INTRAMUSCULAR | Status: DC | PRN
  Administered 2024-03-12: .25 mg via INTRAVENOUS

## 2024-03-12 MED ORDER — DICLOFENAC SUPPOSITORY 100 MG
RECTAL | Status: DC | PRN
  Administered 2024-03-12: 100 mg via RECTAL

## 2024-03-12 MED ORDER — GLUCAGON HCL RDNA (DIAGNOSTIC) 1 MG IJ SOLR
INTRAMUSCULAR | Status: AC
  Filled 2024-03-12: qty 2

## 2024-03-12 MED ORDER — SODIUM CHLORIDE 0.9 % IV SOLN
INTRAVENOUS | Status: DC | PRN
  Administered 2024-03-12: 20 mL

## 2024-03-12 MED ORDER — FENTANYL CITRATE (PF) 100 MCG/2ML IJ SOLN
INTRAMUSCULAR | Status: AC
  Filled 2024-03-12: qty 2

## 2024-03-12 MED ORDER — PHENYLEPHRINE 80 MCG/ML (10ML) SYRINGE FOR IV PUSH (FOR BLOOD PRESSURE SUPPORT)
PREFILLED_SYRINGE | INTRAVENOUS | Status: DC | PRN
  Administered 2024-03-12: 80 ug via INTRAVENOUS
  Administered 2024-03-12: 160 ug via INTRAVENOUS

## 2024-03-12 MED ORDER — PROPOFOL 10 MG/ML IV BOLUS
INTRAVENOUS | Status: DC | PRN
  Administered 2024-03-12: 150 mg via INTRAVENOUS

## 2024-03-12 MED ORDER — MIDAZOLAM HCL 2 MG/2ML IJ SOLN
INTRAMUSCULAR | Status: AC
  Filled 2024-03-12: qty 2

## 2024-03-12 MED ORDER — ROCURONIUM BROMIDE 10 MG/ML (PF) SYRINGE
PREFILLED_SYRINGE | INTRAVENOUS | Status: DC | PRN
  Administered 2024-03-12: 60 mg via INTRAVENOUS

## 2024-03-12 MED ORDER — MIDAZOLAM HCL (PF) 2 MG/2ML IJ SOLN
INTRAMUSCULAR | Status: DC | PRN
  Administered 2024-03-12: 1 mg via INTRAVENOUS

## 2024-03-12 MED ORDER — LACTATED RINGERS IV SOLN
INTRAVENOUS | Status: AC | PRN
  Administered 2024-03-12: 1000 mL via INTRAVENOUS

## 2024-03-12 MED ORDER — SODIUM CHLORIDE 0.9 % IV SOLN: INTRAVENOUS | Status: DC

## 2024-03-12 MED ORDER — ALBUMIN HUMAN 5 % IV SOLN: INTRAVENOUS | Status: DC | PRN

## 2024-03-12 MED ORDER — DICLOFENAC SUPPOSITORY 100 MG
RECTAL | Status: AC
  Filled 2024-03-12: qty 1

## 2024-03-12 MED ORDER — ONDANSETRON HCL 4 MG/2ML IJ SOLN
INTRAMUSCULAR | Status: DC | PRN
  Administered 2024-03-12: 4 mg via INTRAVENOUS

## 2024-03-12 MED ORDER — LIDOCAINE 2% (20 MG/ML) 5 ML SYRINGE
INTRAMUSCULAR | Status: DC | PRN
  Administered 2024-03-12: 80 mg via INTRAVENOUS

## 2024-03-12 MED ORDER — DEXAMETHASONE SOD PHOSPHATE PF 10 MG/ML IJ SOLN
INTRAMUSCULAR | Status: DC | PRN
  Administered 2024-03-12: 10 mg via INTRAVENOUS

## 2024-03-12 MED ORDER — SODIUM CHLORIDE 0.9 % IV SOLN
1.5000 g | Freq: Once | INTRAVENOUS | Status: AC
  Administered 2024-03-12: 1.5 g via INTRAVENOUS

## 2024-03-12 MED ORDER — FENTANYL CITRATE (PF) 250 MCG/5ML IJ SOLN
INTRAMUSCULAR | Status: DC | PRN
  Administered 2024-03-12 (×2): 50 ug via INTRAVENOUS

## 2024-03-12 MED ORDER — SUGAMMADEX SODIUM 200 MG/2ML IV SOLN
INTRAVENOUS | Status: DC | PRN
  Administered 2024-03-12: 200 mg via INTRAVENOUS

## 2024-03-12 MED ORDER — SODIUM CHLORIDE 0.9 % IV SOLN
INTRAVENOUS | Status: AC
  Filled 2024-03-12: qty 4

## 2024-03-12 NOTE — Anesthesia Postprocedure Evaluation (Signed)
"   Anesthesia Post Note  Patient: Nathan Stewart  Procedure(s) Performed: ERCP, WITH INTERVENTION IF INDICATED     Patient location during evaluation: PACU Anesthesia Type: General Level of consciousness: awake and alert Pain management: pain level controlled Vital Signs Assessment: post-procedure vital signs reviewed and stable Respiratory status: spontaneous breathing, nonlabored ventilation and respiratory function stable Cardiovascular status: stable and blood pressure returned to baseline Anesthetic complications: no   No notable events documented.  Last Vitals:  Vitals:   03/12/24 0920 03/12/24 0930  BP: 130/72 136/75  Pulse: 68 67  Resp: (!) 26 14  Temp:    SpO2: 95% 99%    Last Pain:  Vitals:   03/12/24 0930  TempSrc:   PainSc: 0-No pain                 Debby FORBES Like      "

## 2024-03-12 NOTE — Transfer of Care (Signed)
 Immediate Anesthesia Transfer of Care Note  Patient: Nathan Stewart  Procedure(s) Performed: ERCP, WITH INTERVENTION IF INDICATED  Patient Location: PACU  Anesthesia Type:General  Level of Consciousness: drowsy and patient cooperative  Airway & Oxygen Therapy: Patient Spontanous Breathing  Post-op Assessment: Report given to RN and Post -op Vital signs reviewed and stable  Post vital signs: Reviewed and stable  Last Vitals:  Vitals Value Taken Time  BP 116/61 03/12/24 08:56  Temp    Pulse 71 03/12/24 08:57  Resp 13 03/12/24 08:57  SpO2 96 % 03/12/24 08:57  Vitals shown include unfiled device data.  Last Pain:  Vitals:   03/12/24 0856  TempSrc: Temporal  PainSc: 0-No pain         Complications: No notable events documented.

## 2024-03-12 NOTE — Telephone Encounter (Signed)
 ERCP done, old stent removed, new inserted. Nathan Stewart, needs EGD with stent removal (not ERCP) in 4-6 weeks at [facility] with me or Lupita. PL checl CBC, CMP prior

## 2024-03-12 NOTE — Op Note (Addendum)
 [facility] Patient Name: Nathan Stewart Procedure Date : 03/12/2024 MRN: [id number] Attending MD: [name] , MD, [id number] Date of Birth: 1955/07/06 CSN: [id number] Age: 69 Admit Type: Outpatient Procedure:                ERCP Indications:              Bile leak post gangrenous cholecystectomy s/p                            sphincterotomy/stent insertion 02/21/2024, now with                            dislodged stent on CT. Providers:                [name], MD, Hoy jenkins Penner, RN, Coye Bade, Technician Referring MD:             Dr Camellia Blush, Dr Avram Medicines:                General Anesthesia, Indomethacin 100 mg PR, Unasyn                             1.5 g IV, Glucagon  0.25 mg IV Complications:            No immediate complications. Estimated Blood Loss:     Estimated blood loss: none. Procedure:                Pre-Anesthesia Assessment:                           - Prior to the procedure, a History and Physical                            was performed, and patient medications and                            allergies were reviewed. The patient's tolerance of                            previous anesthesia was also reviewed. The risks                            and benefits of the procedure and the sedation                            options and risks were discussed with the patient.                            All questions were answered, and informed consent                            was obtained. Prior Anticoagulants: The patient has  taken no anticoagulant or antiplatelet agents. ASA                            Grade Assessment: II - A patient with mild systemic                            disease. After reviewing the risks and benefits,                            the patient was deemed in satisfactory condition to                            undergo the procedure.                            After obtaining informed consent, the scope was                            passed under direct vision. Throughout the                            procedure, the patient's blood pressure, pulse, and                            oxygen saturations were monitored continuously. The                            TJF-Q190V ([id number]) Olympus duodenoscope was                            introduced through the mouth, and used to inject                            contrast into and used to inject contrast into the                            bile duct. The ERCP was accomplished without                            difficulty. The patient tolerated the procedure                            well. Scope In: Scope Out: Findings:      A biliary stent and Surgical clips, consistent with a previous       cholecystectomy, were seen in the area of the right upper quadrant of       the abdomen on scout film. The stent had partially dislodged but the       upper part was still in major papilla.      One stent was removed from the biliary tree using a snare. There was       evidence of previous sphincterotomy. This was wide open. Contrast was       injected but not under too much pressure. There were no obvious leaks  noted. CBD measured approximately 8- 9 mm. To discover objects, the       biliary tree was swept with a 9 mm balloon starting at the bifurcation       few times. Nothing was found. We decided to replace the stent with       plastic stent since there was no high-grade leaks. One 10 Fr by 7 cm       plastic stent was placed into the common bile duct. The stent was in       good position. There was good drainage. Bile was green.      PD was intentionally not cannulated or injected. Indocin suppositories       were given prior to the insertion of the scope. Impression:               - Prev postcholecystectomy bile leak with stent                            dislodgment s/p insertion of longer (10Fr 7 cm)                             plastic stent.                           - Patent biliary sphincterotomy. Recommendation:           - Discharge patient to home.                           - Recommend biliary stent removal via EGD in 4-6                            weeks.                           - Follow CBC, CMP drawn today preoperatively.                           - Watch for pancreatitis, bleeding, perforation,                            and cholangitis.                           - The findings and recommendations were discussed                            with the patient.                           - The findings and recommendations were discussed                            with the patient's family. Procedure Code(s):        --- Professional ---                           [id number], Endoscopic retrograde  cholangiopancreatography (ERCP); with removal and                            exchange of stent(s), biliary or pancreatic duct,                            including pre- and post-dilation and guide wire                            passage, when performed, including sphincterotomy,                            when performed, each stent exchanged Diagnosis Code(s):        --- Professional ---                           S53.40, Encounter for fitting and adjustment of                            other gastrointestinal appliance and device                           K83.8, Other specified diseases of biliary tract CPT copyright 2022 [employer]. All rights reserved. The codes documented in this report are preliminary and upon coder review may  be revised to meet current compliance requirements. [name], MD 03/12/2024 8:59:23 AM This report has been signed electronically. Number of Addenda: 0

## 2024-03-12 NOTE — Interval H&P Note (Signed)
 History and Physical Interval Note:  Pt with postcholecystectomy bile leak d/t choledocholithiasis s/p biliary sphincterotomy/stone extraction/10 French 5 cm plastic stent insertion.  Unfortunately, there has been stent dislodgment on recent CT.  Patient is asymptomatic.  Plan: -Rpt ERCPtoday -CBC, CMP today  03/12/2024 7:20 AM  Nathan Stewart  has presented today for surgery, with the diagnosis of K83.9.  The various methods of treatment have been discussed with the patient and family. After consideration of risks, benefits and other options for treatment, the patient has consented to  Procedures: ERCP, WITH INTERVENTION IF INDICATED (N/A) as a surgical intervention.  The patient's history has been reviewed, patient examined, no change in status, stable for surgery.  I have reviewed the patient's chart and labs.  Questions were answered to the patient's satisfaction.     Nathan Stewart

## 2024-03-12 NOTE — Discharge Instructions (Addendum)
 YOU HAD AN ENDOSCOPIC PROCEDURE TODAY: Refer to the procedure report and other information in the discharge instructions given to you for any specific questions about what was found during the examination. If this information does not answer your questions, please call [facility] office at [phone] to clarify.   YOU SHOULD EXPECT: Some feelings of bloating in the abdomen. Passage of more gas than usual. Walking can help get rid of the air that was put into your GI tract during the procedure and reduce the bloating. If you had a lower endoscopy (such as a colonoscopy or flexible sigmoidoscopy) you may notice spotting of blood in your stool or on the toilet paper. Some abdominal soreness may be present for a day or two, also.  DIET:  Liquid diet today, increase tomorrow to regular diet as tolerated. Heavy, fatty, or fried foods are harder to digest and may make you feel nauseous or bloated. Drink plenty of fluids but you should avoid alcoholic beverages for 24 hours.   ACTIVITY: Your care partner should take you home directly after the procedure. You should plan to take it easy, moving slowly for the rest of the day. You can resume normal activity the day after the procedure however YOU SHOULD NOT DRIVE, use power tools, machinery or perform tasks that involve climbing or major physical exertion for 24 hours (because of the sedation medicines used during the test).   SYMPTOMS TO REPORT IMMEDIATELY: A gastroenterologist can be reached at any hour. Please call [phone]  for any of the following symptoms:   Following upper endoscopy (EGD, EUS, ERCP, esophageal dilation) Vomiting of blood or coffee ground material  New, significant abdominal pain  New, significant chest pain or pain under the shoulder blades  Painful or persistently difficult swallowing  New shortness of breath  Black, tarry-looking or red, bloody stools  FOLLOW UP:  If any biopsies were taken you will be contacted by phone or by  letter within the next 1-3 weeks. Call [phone]  if you have not heard about the biopsies in 3 weeks.  Please also call with any specific questions about appointments or follow up tests.

## 2024-03-12 NOTE — Anesthesia Procedure Notes (Signed)
 Procedure Name: Intubation Date/Time: 03/12/2024 7:47 AM  Performed by: Hedy Jarred, CRNAPre-anesthesia Checklist: Patient identified, Emergency Drugs available, Suction available and Patient being monitored Patient Re-evaluated:Patient Re-evaluated prior to induction Oxygen Delivery Method: Circle System Utilized Preoxygenation: Pre-oxygenation with 100% oxygen Induction Type: IV induction Ventilation: Mask ventilation without difficulty, Two handed mask ventilation required and Oral airway inserted - appropriate to patient size Laryngoscope Size: Cleotilde and 2 Grade View: Grade I Tube type: Oral Tube size: 7.5 mm Number of attempts: 1 Airway Equipment and Method: Stylet and Oral airway Placement Confirmation: ETT inserted through vocal cords under direct vision, positive ETCO2 and breath sounds checked- equal and bilateral Secured at: 23 cm Tube secured with: Tape Dental Injury: Teeth and Oropharynx as per pre-operative assessment

## 2024-03-14 ENCOUNTER — Encounter (HOSPITAL_COMMUNITY): Payer: Self-pay | Admitting: Gastroenterology

## 2024-03-18 ENCOUNTER — Other Ambulatory Visit: Payer: Self-pay | Admitting: Family Medicine

## 2024-03-23 NOTE — Telephone Encounter (Signed)
 Lab order has been entered  Previsit appt made for 04/08/24 at 10 am  EGD appt made for 04/15/24 at 1230 pm   Left message on machine to call back

## 2024-03-24 NOTE — Telephone Encounter (Signed)
 The pt has been advised to come in for labs next week.   Previsit and EGD scheduled, pt instructed and medications reviewed.  Patient instructions mailed to home.  Patient to call with any questions or concerns.

## 2024-03-26 ENCOUNTER — Other Ambulatory Visit

## 2024-03-26 DIAGNOSIS — T859XXA Unspecified complication of internal prosthetic device, implant and graft, initial encounter: Secondary | ICD-10-CM

## 2024-03-26 LAB — CBC WITH DIFFERENTIAL/PLATELET
Basophils Absolute: 0.1 10*3/uL (ref 0.0–0.1)
Basophils Relative: 0.7 % (ref 0.0–3.0)
Eosinophils Absolute: 0.3 10*3/uL (ref 0.0–0.7)
Eosinophils Relative: 3.8 % (ref 0.0–5.0)
HCT: 38.6 % — ABNORMAL LOW (ref 39.0–52.0)
Hemoglobin: 13.1 g/dL (ref 13.0–17.0)
Lymphocytes Relative: 21.6 % (ref 12.0–46.0)
Lymphs Abs: 1.7 10*3/uL (ref 0.7–4.0)
MCHC: 34 g/dL (ref 30.0–36.0)
MCV: 86.4 fl (ref 78.0–100.0)
Monocytes Absolute: 0.5 10*3/uL (ref 0.1–1.0)
Monocytes Relative: 6.7 % (ref 3.0–12.0)
Neutro Abs: 5.2 10*3/uL (ref 1.4–7.7)
Neutrophils Relative %: 67.2 % (ref 43.0–77.0)
Platelets: 217 10*3/uL (ref 150.0–400.0)
RBC: 4.46 Mil/uL (ref 4.22–5.81)
RDW: 14 % (ref 11.5–15.5)
WBC: 7.8 10*3/uL (ref 4.0–10.5)

## 2024-03-26 LAB — COMPREHENSIVE METABOLIC PANEL WITH GFR
ALT: 40 U/L (ref 3–53)
AST: 30 U/L (ref 5–37)
Albumin: 4.7 g/dL (ref 3.5–5.2)
Alkaline Phosphatase: 85 U/L (ref 39–117)
BUN: 13 mg/dL (ref 6–23)
CO2: 23 meq/L (ref 19–32)
Calcium: 10.2 mg/dL (ref 8.4–10.5)
Chloride: 101 meq/L (ref 96–112)
Creatinine, Ser: 0.7 mg/dL (ref 0.40–1.50)
GFR: 94.43 mL/min
Glucose, Bld: 121 mg/dL — ABNORMAL HIGH (ref 70–99)
Potassium: 4.1 meq/L (ref 3.5–5.1)
Sodium: 136 meq/L (ref 135–145)
Total Bilirubin: 0.7 mg/dL (ref 0.2–1.2)
Total Protein: 7.5 g/dL (ref 6.0–8.3)

## 2024-03-29 ENCOUNTER — Ambulatory Visit: Payer: Self-pay | Admitting: Internal Medicine

## 2024-04-08 ENCOUNTER — Encounter

## 2024-04-15 ENCOUNTER — Encounter: Admitting: Internal Medicine

## 2024-04-26 ENCOUNTER — Ambulatory Visit: Admitting: Infectious Disease

## 2024-11-05 ENCOUNTER — Other Ambulatory Visit

## 2024-11-11 ENCOUNTER — Encounter: Admitting: Family Medicine

## 2024-12-09 ENCOUNTER — Ambulatory Visit
